# Patient Record
Sex: Female | Born: 1955 | Race: Black or African American | Hispanic: No | Marital: Married | Smoking: Never smoker
Health system: Southern US, Community
[De-identification: ages and names within clinical notes are randomized; demographics above are authoritative.]

## PROBLEM LIST (undated history)

## (undated) DIAGNOSIS — I639 Cerebral infarction, unspecified: Secondary | ICD-10-CM

## (undated) DIAGNOSIS — K219 Gastro-esophageal reflux disease without esophagitis: Secondary | ICD-10-CM

## (undated) DIAGNOSIS — Z87898 Personal history of other specified conditions: Secondary | ICD-10-CM

## (undated) DIAGNOSIS — M51369 Other intervertebral disc degeneration, lumbar region without mention of lumbar back pain or lower extremity pain: Secondary | ICD-10-CM

## (undated) DIAGNOSIS — I272 Pulmonary hypertension, unspecified: Secondary | ICD-10-CM

## (undated) DIAGNOSIS — W19XXXA Unspecified fall, initial encounter: Secondary | ICD-10-CM

## (undated) DIAGNOSIS — J45909 Unspecified asthma, uncomplicated: Secondary | ICD-10-CM

## (undated) DIAGNOSIS — M5136 Other intervertebral disc degeneration, lumbar region: Secondary | ICD-10-CM

## (undated) DIAGNOSIS — F1190 Opioid use, unspecified, uncomplicated: Secondary | ICD-10-CM

## (undated) DIAGNOSIS — G8929 Other chronic pain: Secondary | ICD-10-CM

## (undated) DIAGNOSIS — J449 Chronic obstructive pulmonary disease, unspecified: Secondary | ICD-10-CM

## (undated) DIAGNOSIS — M549 Dorsalgia, unspecified: Secondary | ICD-10-CM

## (undated) DIAGNOSIS — I10 Essential (primary) hypertension: Secondary | ICD-10-CM

## (undated) DIAGNOSIS — IMO0002 Reserved for concepts with insufficient information to code with codable children: Secondary | ICD-10-CM

## (undated) DIAGNOSIS — F1210 Cannabis abuse, uncomplicated: Secondary | ICD-10-CM

## (undated) DIAGNOSIS — F419 Anxiety disorder, unspecified: Secondary | ICD-10-CM

## (undated) DIAGNOSIS — IMO0001 Reserved for inherently not codable concepts without codable children: Secondary | ICD-10-CM

## (undated) HISTORY — PX: CHOLECYSTECTOMY: SHX55

## (undated) HISTORY — PX: OTHER SURGICAL HISTORY: SHX169

## (undated) HISTORY — PX: HERNIA REPAIR: SHX51

---

## 2008-05-15 ENCOUNTER — Emergency Department (HOSPITAL_COMMUNITY): Admission: EM | Admit: 2008-05-15 | Discharge: 2008-05-16 | Payer: Self-pay | Admitting: Emergency Medicine

## 2008-08-09 ENCOUNTER — Emergency Department (HOSPITAL_COMMUNITY): Admission: EM | Admit: 2008-08-09 | Discharge: 2008-08-09 | Payer: Self-pay | Admitting: Internal Medicine

## 2008-09-30 ENCOUNTER — Emergency Department: Payer: Self-pay | Admitting: Emergency Medicine

## 2008-12-20 ENCOUNTER — Emergency Department (HOSPITAL_BASED_OUTPATIENT_CLINIC_OR_DEPARTMENT_OTHER): Admission: EM | Admit: 2008-12-20 | Discharge: 2008-12-20 | Payer: Self-pay | Admitting: Emergency Medicine

## 2008-12-23 ENCOUNTER — Emergency Department (HOSPITAL_BASED_OUTPATIENT_CLINIC_OR_DEPARTMENT_OTHER): Admission: EM | Admit: 2008-12-23 | Discharge: 2008-12-23 | Payer: Self-pay | Admitting: Emergency Medicine

## 2009-02-15 ENCOUNTER — Emergency Department (HOSPITAL_BASED_OUTPATIENT_CLINIC_OR_DEPARTMENT_OTHER): Admission: EM | Admit: 2009-02-15 | Discharge: 2009-02-15 | Payer: Self-pay | Admitting: Emergency Medicine

## 2009-02-16 ENCOUNTER — Emergency Department: Payer: Self-pay | Admitting: Emergency Medicine

## 2009-03-01 ENCOUNTER — Emergency Department (HOSPITAL_COMMUNITY): Admission: EM | Admit: 2009-03-01 | Discharge: 2009-03-01 | Payer: Self-pay | Admitting: Emergency Medicine

## 2009-05-24 ENCOUNTER — Ambulatory Visit: Payer: Self-pay | Admitting: Gastroenterology

## 2009-06-06 ENCOUNTER — Telehealth: Payer: Self-pay | Admitting: Gastroenterology

## 2009-06-10 ENCOUNTER — Emergency Department (HOSPITAL_BASED_OUTPATIENT_CLINIC_OR_DEPARTMENT_OTHER): Admission: EM | Admit: 2009-06-10 | Discharge: 2009-06-10 | Payer: Self-pay | Admitting: Emergency Medicine

## 2009-06-10 ENCOUNTER — Ambulatory Visit: Payer: Self-pay | Admitting: Diagnostic Radiology

## 2009-06-15 ENCOUNTER — Ambulatory Visit: Payer: Self-pay | Admitting: Gastroenterology

## 2009-07-09 ENCOUNTER — Emergency Department (HOSPITAL_BASED_OUTPATIENT_CLINIC_OR_DEPARTMENT_OTHER): Admission: EM | Admit: 2009-07-09 | Discharge: 2009-07-09 | Payer: Self-pay | Admitting: Emergency Medicine

## 2009-08-27 ENCOUNTER — Emergency Department (HOSPITAL_BASED_OUTPATIENT_CLINIC_OR_DEPARTMENT_OTHER): Admission: EM | Admit: 2009-08-27 | Discharge: 2009-08-27 | Payer: Self-pay | Admitting: Emergency Medicine

## 2009-08-27 ENCOUNTER — Ambulatory Visit: Payer: Self-pay | Admitting: Diagnostic Radiology

## 2009-09-13 ENCOUNTER — Emergency Department: Payer: Self-pay | Admitting: Emergency Medicine

## 2009-10-07 ENCOUNTER — Ambulatory Visit: Payer: Self-pay | Admitting: Diagnostic Radiology

## 2009-10-07 ENCOUNTER — Emergency Department (HOSPITAL_BASED_OUTPATIENT_CLINIC_OR_DEPARTMENT_OTHER): Admission: EM | Admit: 2009-10-07 | Discharge: 2009-10-07 | Payer: Self-pay | Admitting: Emergency Medicine

## 2009-12-29 ENCOUNTER — Emergency Department (HOSPITAL_BASED_OUTPATIENT_CLINIC_OR_DEPARTMENT_OTHER): Admission: EM | Admit: 2009-12-29 | Discharge: 2009-12-29 | Payer: Self-pay | Admitting: Emergency Medicine

## 2010-03-14 ENCOUNTER — Emergency Department (HOSPITAL_BASED_OUTPATIENT_CLINIC_OR_DEPARTMENT_OTHER): Admission: EM | Admit: 2010-03-14 | Discharge: 2010-03-14 | Payer: Self-pay | Admitting: Emergency Medicine

## 2010-12-25 NOTE — Miscellaneous (Signed)
Summary: [facility]  Clinical Lists Changes  Medications: Added new medication of MIRALAX   POWD (POLYETHYLENE GLYCOL 3350) As per prep  instructions. - Signed Added new medication of REGLAN 10 MG  TABS (METOCLOPRAMIDE HCL) As per prep instructions. - Signed Added new medication of DULCOLAX 5 MG  TBEC (BISACODYL) Day before procedure take 2 at 3pm and 2 at 8pm. - Signed Rx of MIRALAX   POWD (POLYETHYLENE GLYCOL 3350) As per prep  instructions.;  #255gm x 0;  Signed;  Entered by: Ezra Sites RN;  Authorized by: Louis Meckel MD;  Method used: Electronically to [facility] [phone]*, [address] [place], [place], [place]  [address], Ph: [phone], Fax: [phone] Rx of REGLAN 10 MG  TABS (METOCLOPRAMIDE HCL) As per prep instructions.;  #2 x 0;  Signed;  Entered by: Ezra Sites RN;  Authorized by: Louis Meckel MD;  Method used: Electronically to [facility] [phone]*, [address], [place], [place], [place]  [address], Ph: [phone], Fax: [phone] Rx of DULCOLAX 5 MG  TBEC (BISACODYL) Day before procedure take 2 at 3pm and 2 at 8pm.;  #4 x 0;  Signed;  Entered by: Ezra Sites RN;  Authorized by: Louis Meckel MD;  Method used: Electronically to [facility] [phone]*, [address] [place], [place], [place]  [address], Ph: [phone], Fax: [phone] Allergies: Added new allergy or adverse reaction of IODINE Added new allergy or adverse reaction of * SHELLFISH Added new allergy or adverse reaction of ERYTHROMYCIN Observations: Added new observation of NKA: F (05/24/2009 12:48)    Prescriptions: DULCOLAX 5 MG  TBEC (BISACODYL) Day before procedure take 2 at 3pm and 2 at 8pm.  #4 x 0   Entered by:   Ezra Sites RN   Authorized by:   Louis Meckel MD   Signed by:   Ezra Sites RN on 05/24/2009   Method used:   Electronically to        [facility] [phone]* (retail)       [address]       [place], [place]  [address]       Ph: [phone]       Fax: [phone]   RxID:   [id number] REGLAN 10 MG  TABS (METOCLOPRAMIDE HCL) As per prep instructions.  #2 x 0   Entered by:   Ezra Sites RN   Authorized by:   Louis Meckel MD   Signed by:   Ezra Sites RN on 05/24/2009   Method used:   Electronically to        [facility] [phone]* (retail)       [address]       [place], [place]  [address]       Ph: [phone]       Fax: [phone]   RxID:   [id number] MIRALAX   POWD (POLYETHYLENE GLYCOL 3350) As per prep  instructions.  #255gm x 0   Entered by:   Ezra Sites RN   Authorized by:   Louis Meckel MD   Signed by:   Ezra Sites RN on 05/24/2009   Method used:   Electronically to        [facility] [phone]* (retail)       [phone]  [place], [place]  [address]       Ph: [phone]       Fax: [phone]   RxID:   [id number]

## 2011-02-13 LAB — URINALYSIS, ROUTINE W REFLEX MICROSCOPIC
Ketones, ur: NEGATIVE mg/dL
Nitrite: NEGATIVE
Specific Gravity, Urine: 1.015 (ref 1.005–1.030)
pH: 7 (ref 5.0–8.0)

## 2011-02-13 LAB — URINE CULTURE: Colony Count: 45000

## 2011-02-27 LAB — GLUCOSE, CAPILLARY

## 2011-03-03 LAB — BASIC METABOLIC PANEL
CO2: 29 mEq/L (ref 19–32)
Calcium: 9.1 mg/dL (ref 8.4–10.5)
Chloride: 101 mEq/L (ref 96–112)
Creatinine, Ser: 1.2 mg/dL (ref 0.4–1.2)
GFR calc Af Amer: 57 mL/min — ABNORMAL LOW (ref >60)
GFR calc non Af Amer: 47 mL/min — ABNORMAL LOW (ref >60)
Potassium: 3.6 mEq/L (ref 3.5–5.1)
Sodium: 143 mEq/L (ref 135–145)

## 2011-03-03 LAB — POCT B-TYPE NATRIURETIC PEPTIDE (BNP): B Natriuretic Peptide, POC: 8 pg/mL (ref 0–100)

## 2011-03-07 LAB — PREGNANCY, URINE: Preg Test, Ur: NEGATIVE

## 2011-03-07 LAB — URINALYSIS, ROUTINE W REFLEX MICROSCOPIC
Bilirubin Urine: NEGATIVE
Ketones, ur: NEGATIVE mg/dL
Protein, ur: NEGATIVE mg/dL
Specific Gravity, Urine: 1.017 (ref 1.005–1.030)
Urobilinogen, UA: 1 mg/dL (ref 0.0–1.0)
pH: 7 (ref 5.0–8.0)

## 2011-03-07 LAB — URINE MICROSCOPIC-ADD ON

## 2012-07-24 ENCOUNTER — Emergency Department (HOSPITAL_BASED_OUTPATIENT_CLINIC_OR_DEPARTMENT_OTHER)
Admission: EM | Admit: 2012-07-24 | Discharge: 2012-07-24 | Disposition: A | Discharge status: Home / Self Care | Payer: Medicaid Other | Attending: Emergency Medicine | Admitting: Emergency Medicine

## 2012-07-24 ENCOUNTER — Emergency Department (HOSPITAL_BASED_OUTPATIENT_CLINIC_OR_DEPARTMENT_OTHER): Payer: Medicaid Other

## 2012-07-24 ENCOUNTER — Encounter (HOSPITAL_BASED_OUTPATIENT_CLINIC_OR_DEPARTMENT_OTHER): Payer: Self-pay

## 2012-07-24 DIAGNOSIS — R05 Cough: Secondary | ICD-10-CM | POA: Insufficient documentation

## 2012-07-24 DIAGNOSIS — IMO0002 Reserved for concepts with insufficient information to code with codable children: Secondary | ICD-10-CM | POA: Insufficient documentation

## 2012-07-24 DIAGNOSIS — J45909 Unspecified asthma, uncomplicated: Secondary | ICD-10-CM | POA: Insufficient documentation

## 2012-07-24 DIAGNOSIS — I10 Essential (primary) hypertension: Secondary | ICD-10-CM | POA: Insufficient documentation

## 2012-07-24 DIAGNOSIS — J069 Acute upper respiratory infection, unspecified: Secondary | ICD-10-CM

## 2012-07-24 DIAGNOSIS — R059 Cough, unspecified: Secondary | ICD-10-CM | POA: Insufficient documentation

## 2012-07-24 HISTORY — DX: Unspecified asthma, uncomplicated: J45.909

## 2012-07-24 HISTORY — DX: Essential (primary) hypertension: I10

## 2012-07-24 HISTORY — DX: Reserved for concepts with insufficient information to code with codable children: IMO0002

## 2012-07-24 MED ORDER — AMOXICILLIN 500 MG PO CAPS: 500.0000 mg | ORAL_CAPSULE | Freq: Three times a day (TID) | ORAL | Status: AC

## 2012-07-24 MED ORDER — ALBUTEROL SULFATE (5 MG/ML) 0.5% IN NEBU
5.0000 mg | INHALATION_SOLUTION | Freq: Once | RESPIRATORY_TRACT | Status: AC
  Administered 2012-07-24: 5 mg via RESPIRATORY_TRACT
  Filled 2012-07-24: qty 1

## 2012-07-24 MED ORDER — IPRATROPIUM BROMIDE 0.02 % IN SOLN
0.5000 mg | Freq: Once | RESPIRATORY_TRACT | Status: AC
  Administered 2012-07-24: 0.5 mg via RESPIRATORY_TRACT
  Filled 2012-07-24: qty 2.5

## 2012-07-24 NOTE — ED Notes (Signed)
Pt reports chest congestion and nonproductive cough x 1 month unrelieved after taking OTC Mucinex.

## 2012-07-24 NOTE — ED Notes (Signed)
Patient transported to X-ray 

## 2012-07-24 NOTE — Discharge Instructions (Signed)

## 2012-07-24 NOTE — ED Notes (Signed)
MD at bedside. 

## 2012-07-24 NOTE — ED Provider Notes (Addendum)
History     CSN: [id number]  Arrival date & time 07/24/12  1014   First MD Initiated Contact with Patient 07/24/12 1029      Chief Complaint  Patient presents with  . Cough  . URI    (Consider location/radiation/quality/duration/timing/severity/associated sxs/prior treatment) HPI Comments: Patient with history of asthma, smoker.  Presents with one month history of chest congestion, cough that is non-productive, shortness of breath.  She denies fevers or chills.  No chest pain or leg swelling.    Patient is a 56 y.o. female presenting with cough. The history is provided by the patient.  Cough This is a new problem. Episode onset: one month ago. The problem occurs constantly. The problem has been gradually worsening. The cough is non-productive. There has been no fever. Associated symptoms include shortness of breath. Pertinent negatives include no chest pain. Treatments tried: inhaler. The treatment provided no relief. She is a smoker.    Past Medical History  Diagnosis Date  . Asthma   . Hypertension   . Degenerative disc disease     Past Surgical History  Procedure Date  . Cesarean section     No family history on file.  History  Substance Use Topics  . Smoking status: Not on file  . Smokeless tobacco: Never Used  . Alcohol Use: No    OB History    Grav Para Term Preterm Abortions TAB SAB Ect Mult Living                  Review of Systems  Respiratory: Positive for shortness of breath.   Cardiovascular: Negative for chest pain.  All other systems reviewed and are negative.    Allergies  Erythromycin and Iodine  Home Medications   Current Outpatient Rx  Name Route Sig Dispense Refill  . ALBUTEROL SULFATE HFA IN Inhalation Inhale into the lungs.    . ALPRAZOLAM 2 MG PO TABS Oral Take 2 mg by mouth 3 (three) times daily.    Marland Kitchen AMLODIPINE BESYLATE 10 MG PO TABS Oral Take 10 mg by mouth daily.    Marland Kitchen GABAPENTIN 300 MG PO CAPS Oral Take 300 mg by mouth at  bedtime.    . OXYCODONE HCL ER 40 MG PO TB12 Oral Take 40 mg by mouth 3 (three) times daily.    . OXYCODONE-ACETAMINOPHEN 10-325 MG PO TABS Oral Take 1 tablet by mouth every 4 (four) hours as needed.      BP 138/73  Pulse 108  Temp 98.4 F (36.9 C) (Oral)  Resp 18  Ht 5\' 10"  (1.778 m)  Wt 288 lb (130.636 kg)  BMI 41.32 kg/m2  SpO2 98%  Physical Exam  Nursing note and vitals reviewed. Constitutional: She is oriented to person, place, and time. She appears well-developed and well-nourished. No distress.  HENT:  Head: Normocephalic and atraumatic.  Neck: Normal range of motion. Neck supple.  Cardiovascular: Normal rate and regular rhythm.  Exam reveals no gallop and no friction rub.   No murmur heard. Pulmonary/Chest: Effort normal and breath sounds normal.        Slight rhonchi bilaterally but no rales.  Abdominal: Soft. Bowel sounds are normal. She exhibits no distension. There is no tenderness.  Musculoskeletal: Normal range of motion.  Neurological: She is alert and oriented to person, place, and time.  Skin: Skin is warm and dry. She is not diaphoretic.    ED Course  Procedures (including critical care time)  Labs Reviewed - No data to  display No results found.   No diagnosis found.    MDM  The patient was given a neb and the chest xray looks okay.  She will be discharged with amoxicillin and prn follow up.  Continue mdi.        Geoffery Lyons, MD 07/24/12 1153  Geoffery Lyons, MD 07/24/12 1155

## 2012-08-06 ENCOUNTER — Emergency Department (HOSPITAL_BASED_OUTPATIENT_CLINIC_OR_DEPARTMENT_OTHER): Payer: Medicaid Other

## 2012-08-06 ENCOUNTER — Encounter (HOSPITAL_BASED_OUTPATIENT_CLINIC_OR_DEPARTMENT_OTHER): Payer: Self-pay | Admitting: *Deleted

## 2012-08-06 ENCOUNTER — Emergency Department (HOSPITAL_BASED_OUTPATIENT_CLINIC_OR_DEPARTMENT_OTHER)
Admission: EM | Admit: 2012-08-06 | Discharge: 2012-08-06 | Disposition: A | Discharge status: Home / Self Care | Payer: Medicaid Other | Attending: Emergency Medicine | Admitting: Emergency Medicine

## 2012-08-06 DIAGNOSIS — R51 Headache: Secondary | ICD-10-CM | POA: Insufficient documentation

## 2012-08-06 DIAGNOSIS — M25519 Pain in unspecified shoulder: Secondary | ICD-10-CM | POA: Insufficient documentation

## 2012-08-06 DIAGNOSIS — M545 Low back pain, unspecified: Secondary | ICD-10-CM | POA: Insufficient documentation

## 2012-08-06 DIAGNOSIS — I10 Essential (primary) hypertension: Secondary | ICD-10-CM | POA: Insufficient documentation

## 2012-08-06 DIAGNOSIS — S0990XA Unspecified injury of head, initial encounter: Secondary | ICD-10-CM | POA: Insufficient documentation

## 2012-08-06 DIAGNOSIS — E119 Type 2 diabetes mellitus without complications: Secondary | ICD-10-CM | POA: Insufficient documentation

## 2012-08-06 DIAGNOSIS — N644 Mastodynia: Secondary | ICD-10-CM | POA: Insufficient documentation

## 2012-08-06 DIAGNOSIS — IMO0002 Reserved for concepts with insufficient information to code with codable children: Secondary | ICD-10-CM | POA: Insufficient documentation

## 2012-08-06 HISTORY — DX: Other intervertebral disc degeneration, lumbar region: M51.36

## 2012-08-06 HISTORY — DX: Other intervertebral disc degeneration, lumbar region without mention of lumbar back pain or lower extremity pain: M51.369

## 2012-08-06 HISTORY — DX: Dorsalgia, unspecified: M54.9

## 2012-08-06 HISTORY — DX: Other chronic pain: G89.29

## 2012-08-06 HISTORY — DX: Reserved for inherently not codable concepts without codable children: IMO0001

## 2012-08-06 HISTORY — DX: Gastro-esophageal reflux disease without esophagitis: K21.9

## 2012-08-06 LAB — URINALYSIS, ROUTINE W REFLEX MICROSCOPIC
Ketones, ur: NEGATIVE mg/dL
Nitrite: NEGATIVE
Urobilinogen, UA: 1 mg/dL (ref 0.0–1.0)
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
## 2012-08-06 NOTE — ED Notes (Signed)
Patient ambulatory to the bathroom with standby assistance; gait steady.

## 2012-08-17 ENCOUNTER — Emergency Department (HOSPITAL_BASED_OUTPATIENT_CLINIC_OR_DEPARTMENT_OTHER)
Admission: EM | Admit: 2012-08-17 | Discharge: 2012-08-17 | Disposition: A | Discharge status: Home / Self Care | Payer: Medicaid Other | Attending: Emergency Medicine | Admitting: Emergency Medicine

## 2012-08-17 ENCOUNTER — Emergency Department (HOSPITAL_BASED_OUTPATIENT_CLINIC_OR_DEPARTMENT_OTHER): Payer: Medicaid Other

## 2012-08-17 ENCOUNTER — Encounter (HOSPITAL_BASED_OUTPATIENT_CLINIC_OR_DEPARTMENT_OTHER): Payer: Self-pay | Admitting: Student

## 2012-08-17 DIAGNOSIS — K219 Gastro-esophageal reflux disease without esophagitis: Secondary | ICD-10-CM | POA: Insufficient documentation

## 2012-08-17 DIAGNOSIS — J189 Pneumonia, unspecified organism: Secondary | ICD-10-CM | POA: Insufficient documentation

## 2012-08-17 DIAGNOSIS — G8929 Other chronic pain: Secondary | ICD-10-CM | POA: Insufficient documentation

## 2012-08-17 DIAGNOSIS — J45909 Unspecified asthma, uncomplicated: Secondary | ICD-10-CM | POA: Insufficient documentation

## 2012-08-17 DIAGNOSIS — I10 Essential (primary) hypertension: Secondary | ICD-10-CM | POA: Insufficient documentation

## 2012-08-17 DIAGNOSIS — Z91041 Radiographic dye allergy status: Secondary | ICD-10-CM | POA: Insufficient documentation

## 2012-08-17 DIAGNOSIS — E119 Type 2 diabetes mellitus without complications: Secondary | ICD-10-CM | POA: Insufficient documentation

## 2012-08-17 DIAGNOSIS — F17200 Nicotine dependence, unspecified, uncomplicated: Secondary | ICD-10-CM | POA: Insufficient documentation

## 2012-08-17 DIAGNOSIS — Z881 Allergy status to other antibiotic agents status: Secondary | ICD-10-CM | POA: Insufficient documentation

## 2012-08-17 DIAGNOSIS — M549 Dorsalgia, unspecified: Secondary | ICD-10-CM | POA: Insufficient documentation

## 2012-08-17 LAB — CBC WITH DIFFERENTIAL/PLATELET
Basophils Relative: 0 % (ref 0–1)
Eosinophils Absolute: 0.1 10*3/uL (ref 0.0–0.7)
Hemoglobin: 10.4 g/dL — ABNORMAL LOW (ref 12.0–15.0)
MCH: 24.5 pg — ABNORMAL LOW (ref 26.0–34.0)
MCHC: 31.1 g/dL (ref 30.0–36.0)
Monocytes Relative: 6 % (ref 3–12)
Neutrophils Relative %: 80 % — ABNORMAL HIGH (ref 43–77)
Platelets: 365 10*3/uL (ref 150–400)

## 2012-08-17 LAB — URINALYSIS, ROUTINE W REFLEX MICROSCOPIC
Bilirubin Urine: NEGATIVE
Ketones, ur: NEGATIVE mg/dL
Nitrite: NEGATIVE
Protein, ur: NEGATIVE mg/dL
Specific Gravity, Urine: 1.016 (ref 1.005–1.030)
Urobilinogen, UA: 1 mg/dL (ref 0.0–1.0)

## 2012-08-17 LAB — BASIC METABOLIC PANEL
BUN: 11 mg/dL (ref 6–23)
GFR calc Af Amer: 82 mL/min — ABNORMAL LOW (ref >90)
GFR calc non Af Amer: 71 mL/min — ABNORMAL LOW (ref >90)
Potassium: 4.2 mEq/L (ref 3.5–5.1)

## 2012-08-17 MED ORDER — CEFTRIAXONE SODIUM 1 G IJ SOLR
1.0000 g | Freq: Once | INTRAMUSCULAR | Status: AC
  Administered 2012-08-17: 1 g via INTRAMUSCULAR
  Filled 2012-08-17: qty 10

## 2012-08-17 MED ORDER — LIDOCAINE HCL (PF) 1 % IJ SOLN
INTRAMUSCULAR | Status: AC
  Administered 2012-08-17: 12:00:00
  Filled 2012-08-17: qty 5

## 2012-08-17 MED ORDER — DOXYCYCLINE HYCLATE 100 MG PO CAPS: 100.0000 mg | ORAL_CAPSULE | Freq: Two times a day (BID) | ORAL | Status: DC

## 2012-08-17 MED ORDER — DEXTROSE 5 % IV SOLN: 1.0000 g | Freq: Once | INTRAVENOUS | Status: DC

## 2012-08-17 NOTE — ED Notes (Addendum)
Pt rep[orts taking tylenol 2 hrs ago for elevated fever. Reports she is currently in pain management and doesn't want any narcotics.

## 2012-08-17 NOTE — ED Notes (Signed)
Pt in with c/o generalized aches, chills, cough,congestion starting last night

## 2012-08-17 NOTE — ED Provider Notes (Signed)
History     CSN: [id number]  Arrival date & time 08/17/12  0845   First MD Initiated Contact with Patient 08/17/12 [phone]      Chief Complaint  Patient presents with  . URI     HPI Pt in with c/o generalized aches, chills, cough,congestion starting last night.  Patient had temperature up to 104 last night.  Denies vomiting.  Past Medical History  Diagnosis Date  . Asthma   . Hypertension   . Degenerative disc disease   . DDD (degenerative disc disease), lumbar   . DDD (degenerative disc disease)   . Chronic back pain   . DDD (degenerative disc disease)   . Reflux   . Diabetes mellitus     Past Surgical History  Procedure Date  . Cesarean section     History reviewed. No pertinent family history.  History  Substance Use Topics  . Smoking status: Current Every Day Smoker -- 0.5 packs/day  . Smokeless tobacco: Never Used  . Alcohol Use: No    OB History    Grav Para Term Preterm Abortions TAB SAB Ect Mult Living                  Review of Systems  All other systems reviewed and are negative.    Allergies  Erythromycin and Iodine  Home Medications   Current Outpatient Rx  Name Route Sig Dispense Refill  . ALBUTEROL SULFATE HFA IN Inhalation Inhale into the lungs.    . ALPRAZOLAM 2 MG PO TABS Oral Take 2 mg by mouth 3 (three) times daily.    Marland Kitchen AMLODIPINE BESYLATE 10 MG PO TABS Oral Take 10 mg by mouth daily.    Marland Kitchen DOXYCYCLINE HYCLATE 100 MG PO CAPS Oral Take 1 capsule (100 mg total) by mouth 2 (two) times daily. 20 capsule 0  . GABAPENTIN 300 MG PO CAPS Oral Take 300 mg by mouth at bedtime.    . OXYCODONE HCL ER 40 MG PO TB12 Oral Take 40 mg by mouth 3 (three) times daily.    . OXYCODONE-ACETAMINOPHEN 10-325 MG PO TABS Oral Take 1 tablet by mouth every 4 (four) hours as needed.      BP 142/79  Pulse 128  Temp 101.8 F (38.8 C) (Oral)  Resp 18  Wt 260 lb (117.935 kg)  SpO2 95%  Physical Exam  Nursing note and vitals reviewed. Constitutional: She  is oriented to person, place, and time. She appears well-developed. She appears ill. No distress.  HENT:  Head: Normocephalic and atraumatic.  Eyes: Pupils are equal, round, and reactive to light.  Neck: Normal range of motion.  Cardiovascular: Normal rate and intact distal pulses.   Pulmonary/Chest: No respiratory distress.  Abdominal: Normal appearance. She exhibits no distension.  Musculoskeletal: Normal range of motion.  Neurological: She is alert and oriented to person, place, and time. No cranial nerve deficit.  Skin: Skin is warm and dry. No rash noted.  Psychiatric: She has a normal mood and affect. Her behavior is normal.    ED Course  Procedures (including critical care time)   CURB-65 Score=0  Scheduled Meds:   . cefTRIAXone (ROCEPHIN) IM  1 g Intramuscular Once   Continuous Infusions:   . DISCONTD: cefTRIAXone (ROCEPHIN)  IV     PRN Meds:.  Labs Reviewed  CBC WITH DIFFERENTIAL - Abnormal; Notable for the following:    WBC 15.7 (*)     Hemoglobin 10.4 (*)     HCT 33.4 (*)  MCH 24.5 (*)     RDW 16.7 (*)     Neutrophils Relative 80 (*)     Neutro Abs 12.5 (*)     All other components within normal limits  BASIC METABOLIC PANEL - Abnormal; Notable for the following:    Glucose, Bld 121 (*)     GFR calc non Af Amer 71 (*)     GFR calc Af Amer 82 (*)     All other components within normal limits  URINALYSIS, ROUTINE W REFLEX MICROSCOPIC   Dg Chest 2 View  08/17/2012  *RADIOLOGY REPORT*  Clinical Data: Fever  CHEST - 2 VIEW  Comparison: 08/06/2012  Findings: The heart and pulmonary vascularity are within normal limits.  The lungs are well-aerated bilaterally but some mild right basilar infiltrate is seen in the right middle lobe.  Other focal infiltrate is seen.  IMPRESSION: Right middle lobe infiltrate.  Follow-up films to resolution are recommended.   Original Report Authenticated By: Phillips Odor, M.D.      1. Community acquired pneumonia        MDM         Nelia Shi, MD 08/17/12 1102

## 2012-11-14 ENCOUNTER — Emergency Department (HOSPITAL_BASED_OUTPATIENT_CLINIC_OR_DEPARTMENT_OTHER): Payer: Medicaid Other

## 2012-11-14 ENCOUNTER — Encounter (HOSPITAL_BASED_OUTPATIENT_CLINIC_OR_DEPARTMENT_OTHER): Payer: Self-pay

## 2012-11-14 ENCOUNTER — Emergency Department (HOSPITAL_BASED_OUTPATIENT_CLINIC_OR_DEPARTMENT_OTHER)
Admission: EM | Admit: 2012-11-14 | Discharge: 2012-11-15 | Disposition: A | Discharge status: Home / Self Care | Payer: Medicaid Other | Attending: Emergency Medicine | Admitting: Emergency Medicine

## 2012-11-14 DIAGNOSIS — E878 Other disorders of electrolyte and fluid balance, not elsewhere classified: Secondary | ICD-10-CM | POA: Insufficient documentation

## 2012-11-14 DIAGNOSIS — M549 Dorsalgia, unspecified: Secondary | ICD-10-CM | POA: Insufficient documentation

## 2012-11-14 DIAGNOSIS — J45909 Unspecified asthma, uncomplicated: Secondary | ICD-10-CM | POA: Insufficient documentation

## 2012-11-14 DIAGNOSIS — R531 Weakness: Secondary | ICD-10-CM

## 2012-11-14 DIAGNOSIS — R259 Unspecified abnormal involuntary movements: Secondary | ICD-10-CM | POA: Insufficient documentation

## 2012-11-14 DIAGNOSIS — G20 Parkinson's disease: Secondary | ICD-10-CM | POA: Insufficient documentation

## 2012-11-14 DIAGNOSIS — M1990 Unspecified osteoarthritis, unspecified site: Secondary | ICD-10-CM | POA: Insufficient documentation

## 2012-11-14 DIAGNOSIS — F17200 Nicotine dependence, unspecified, uncomplicated: Secondary | ICD-10-CM | POA: Insufficient documentation

## 2012-11-14 DIAGNOSIS — M5137 Other intervertebral disc degeneration, lumbosacral region: Secondary | ICD-10-CM | POA: Insufficient documentation

## 2012-11-14 DIAGNOSIS — E119 Type 2 diabetes mellitus without complications: Secondary | ICD-10-CM | POA: Insufficient documentation

## 2012-11-14 DIAGNOSIS — I10 Essential (primary) hypertension: Secondary | ICD-10-CM | POA: Insufficient documentation

## 2012-11-14 DIAGNOSIS — R42 Dizziness and giddiness: Secondary | ICD-10-CM | POA: Diagnosis present

## 2012-11-14 DIAGNOSIS — Z79899 Other long term (current) drug therapy: Secondary | ICD-10-CM | POA: Insufficient documentation

## 2012-11-14 DIAGNOSIS — R5381 Other malaise: Secondary | ICD-10-CM | POA: Insufficient documentation

## 2012-11-14 DIAGNOSIS — M51379 Other intervertebral disc degeneration, lumbosacral region without mention of lumbar back pain or lower extremity pain: Secondary | ICD-10-CM | POA: Insufficient documentation

## 2012-11-14 DIAGNOSIS — G8929 Other chronic pain: Secondary | ICD-10-CM | POA: Insufficient documentation

## 2012-11-14 DIAGNOSIS — G20A1 Parkinson's disease without dyskinesia, without mention of fluctuations: Secondary | ICD-10-CM | POA: Insufficient documentation

## 2012-11-14 LAB — DIFFERENTIAL
Basophils Absolute: 0 10*3/uL (ref 0.0–0.1)
Basophils Relative: 0 % (ref 0–1)
Eosinophils Absolute: 0.6 K/uL (ref 0.0–0.7)
Eosinophils Relative: 7 % — ABNORMAL HIGH (ref 0–5)
Lymphocytes Relative: 49 % — ABNORMAL HIGH (ref 12–46)
Lymphs Abs: 3.8 K/uL (ref 0.7–4.0)
Monocytes Absolute: 0.9 K/uL (ref 0.1–1.0)
Monocytes Relative: 12 % (ref 3–12)
Neutro Abs: 2.4 10*3/uL (ref 1.7–7.7)
Neutrophils Relative %: 32 % — ABNORMAL LOW (ref 43–77)

## 2012-11-14 LAB — PROTIME-INR
INR: 0.89 (ref 0.00–1.49)
Prothrombin Time: 12 s (ref 11.6–15.2)

## 2012-11-14 LAB — COMPREHENSIVE METABOLIC PANEL
AST: 15 U/L (ref 0–37)
Albumin: 3.9 g/dL (ref 3.5–5.2)
Alkaline Phosphatase: 137 U/L — ABNORMAL HIGH (ref 39–117)
BUN: 9 mg/dL (ref 6–23)
CO2: 27 mEq/L (ref 19–32)
Chloride: 102 mEq/L (ref 96–112)
Potassium: 3.9 mEq/L (ref 3.5–5.1)
Total Bilirubin: 0.2 mg/dL — ABNORMAL LOW (ref 0.3–1.2)

## 2012-11-14 LAB — CBC
HCT: 38.7 % (ref 36.0–46.0)
Hemoglobin: 12 g/dL (ref 12.0–15.0)
MCH: 24.6 pg — ABNORMAL LOW (ref 26.0–34.0)
MCHC: 31 g/dL (ref 30.0–36.0)
MCV: 79.5 fL (ref 78.0–100.0)
Platelets: 427 K/uL — ABNORMAL HIGH (ref 150–400)
RBC: 4.87 MIL/uL (ref 3.87–5.11)
RDW: 16.3 % — ABNORMAL HIGH (ref 11.5–15.5)
WBC: 7.7 10*3/uL (ref 4.0–10.5)

## 2012-11-14 LAB — COMPREHENSIVE METABOLIC PANEL WITH GFR
ALT: 12 U/L (ref 0–35)
Calcium: 9.4 mg/dL (ref 8.4–10.5)
Creatinine, Ser: 0.9 mg/dL (ref 0.50–1.10)
GFR calc Af Amer: 82 mL/min — ABNORMAL LOW (ref >90)
GFR calc non Af Amer: 71 mL/min — ABNORMAL LOW (ref >90)
Glucose, Bld: 112 mg/dL — ABNORMAL HIGH (ref 70–99)
Sodium: 137 meq/L (ref 135–145)
Total Protein: 8.2 g/dL (ref 6.0–8.3)

## 2012-11-14 LAB — APTT: aPTT: 41 seconds — ABNORMAL HIGH (ref 24–37)

## 2012-11-14 NOTE — ED Notes (Signed)
Report to [facility] RN at [facility] ED. Patient remains alert and oriented, EMS at bedside to transport

## 2012-11-14 NOTE — ED Provider Notes (Signed)
History   This chart was scribed for Teresa Wiley. Andra Heslin, MD scribed by Magnus Sinning. The patient was seen in room MH05/MH05 at 23:16    CSN: [id number]  Arrival date & time 11/14/12  2304   None     Chief Complaint  Patient presents with  . Code Stroke    (Consider location/radiation/quality/duration/timing/severity/associated sxs/prior treatment) The history is provided by a relative. No language interpreter was used.   Teresa Wiley is a 56 y.o. female who presents to the Emergency Department complaining of sudden onset severe weakness with associated trembling.  The sister says that it has been a stressful day and the patient was upset today because they were supposed to travel and were unable. The sister says they started watching a movie approximately 3 hours ago and that the pt did not appear like normal self. She says as the movie went on, the pt continued to appear unwell, but the sister notes she did appear to show improvement and that the pt discussed that she wanted to eat. They apparently continued to watch the movie and when the movie concluded the pt expressed she wanted to eat. They reportedly went into the kitchen and began to tidy. At some point the pt stated that she wanted to use the restroom and states when she went to the restroom and stood up from the toilet that she began experiencing dizziness and feelings as if she wer "drunk." She says that this worsened and that shortly after her body felt heavy and she also experienced body trembling. The patient's relative states the patient started having episode about 1.5-2 hours ago.    Per sister, the pt takes 40 mg of oxycontin TID for treatment of DDD, and arthritis The patient denies syncope or HA, and she states she remembers her daughter asking her to watch a movie and laying down to watch the movie. She says she remembers wanting to eat, but noting that the kitchen needed to be cleaned. She says shortly after she began  feeling dizzy like she was unable to concentrate and that her whole body started feeling heavy. She says this worsened and that her head, mouth, and face started shaking. Pt was having difficulty moving and some speech difficulty was noted and face was drawn.  Level 5 caveat due to severity of symptoms and urgent need for intervention.   PCP: Dr. Randell Loop at [facility] Past Medical History  Diagnosis Date  . Asthma   . Hypertension   . Degenerative disc disease   . DDD (degenerative disc disease), lumbar   . DDD (degenerative disc disease)   . Chronic back pain   . DDD (degenerative disc disease)   . Reflux   . Diabetes mellitus     Past Surgical History  Procedure Date  . Cesarean section     History reviewed. No pertinent family history.  History  Substance Use Topics  . Smoking status: Current Every Day Smoker -- 0.5 packs/day  . Smokeless tobacco: Never Used  . Alcohol Use: No    Review of Systems  Unable to perform ROS: Other  Neurological: Positive for dizziness, tremors and weakness. Negative for syncope and headaches.  All other systems reviewed and are negative.    Allergies  Erythromycin and Iodine  Home Medications   Current Outpatient Rx  Name  Route  Sig  Dispense  Refill  . ALBUTEROL SULFATE HFA 108 (90 BASE) MCG/ACT IN AERS   Inhalation   Inhale 2 puffs  into the lungs every 6 (six) hours as needed. For shortness of breath         . ALPRAZOLAM 2 MG PO TABS   Oral   Take 2 mg by mouth 3 (three) times daily.         Marland Kitchen AMLODIPINE BESYLATE 10 MG PO TABS   Oral   Take 10 mg by mouth daily.         Marland Kitchen GABAPENTIN 300 MG PO CAPS   Oral   Take 300 mg by mouth at bedtime.         . OXYCODONE HCL ER 40 MG PO TB12   Oral   Take 40 mg by mouth 3 (three) times daily.         . OXYCODONE-ACETAMINOPHEN 10-325 MG PO TABS   Oral   Take 1 tablet by mouth every 4 (four) hours as needed.           BP 136/86  Pulse 89  Temp 98.4  F (36.9 C) (Oral)  Resp 14  SpO2 99%  Physical Exam  Nursing note and vitals reviewed. Constitutional: She is oriented to person, place, and time. She appears well-developed and well-nourished. No distress.  HENT:  Head: Normocephalic and atraumatic.       Tongue appears slightly dry  Eyes: Conjunctivae normal and EOM are normal. Pupils are equal, round, and reactive to light.  Neck: Neck supple. No tracheal deviation present.  Cardiovascular: Normal rate.   Pulmonary/Chest: Effort normal. No respiratory distress.  Abdominal: She exhibits no distension.  Musculoskeletal: Normal range of motion.  Neurological: She is alert and oriented to person, place, and time. No sensory deficit.       Mild dysarthria. Displaying weakness on the left arm and and left leg. But not putting any counter effort on the right leg when trying to raise the left leg. Very good distal pulses  Skin: Skin is dry.  Psychiatric: She has a normal mood and affect. Her behavior is normal.    ED Course  Procedures (including critical care time)  CRITICAL CARE Performed by: Lear Ng.   Total critical care time: 30 min  Critical care time was exclusive of separately billable procedures and treating other patients.  Critical care was necessary to treat or prevent imminent or life-threatening deterioration.  Critical care was time spent personally by me on the following activities: development of treatment plan with patient and/or surrogate as well as nursing, discussions with consultants, evaluation of patient's response to treatment, examination of patient, obtaining history from patient or surrogate, ordering and performing treatments and interventions, ordering and review of laboratory studies, ordering and review of radiographic studies, pulse oximetry and re-evaluation of patient's condition.  DIAGNOSTIC STUDIES: Oxygen Saturation is 100% on room air, normal by my interpretation.    COORDINATION OF  CARE:    Labs Reviewed  APTT - Abnormal; Notable for the following:    aPTT 41 (*)     All other components within normal limits  CBC - Abnormal; Notable for the following:    MCH 24.6 (*)     RDW 16.3 (*)     Platelets 427 (*)     All other components within normal limits  DIFFERENTIAL - Abnormal; Notable for the following:    Neutrophils Relative 32 (*)     Lymphocytes Relative 49 (*)     Eosinophils Relative 7 (*)     All other components within normal limits  COMPREHENSIVE METABOLIC PANEL - Abnormal;  Notable for the following:    Glucose, Bld 112 (*)     Alkaline Phosphatase 137 (*)     Total Bilirubin 0.2 (*)     GFR calc non Af Amer 71 (*)     GFR calc Af Amer 82 (*)     All other components within normal limits  PROTIME-INR  TROPONIN I   Ct Head Wo Contrast  11/15/2012  *RADIOLOGY REPORT*  Clinical Data: Facial droop and left-sided weakness.  CT HEAD WITHOUT CONTRAST  Technique:  Contiguous axial images were obtained from the base of the skull through the vertex without contrast.  Comparison: 08/06/2012  Findings: There is no evidence for acute hemorrhage, midline shift, hydrocephalus, or mass lesion.  It is difficult to exclude areas of low density in the right cerebral cortex.  This could be artifactual.  As a result, an acute infarct cannot be excluded. There is mucosal disease in the right maxillary sinus.  No acute bony abnormality.  There is fluid in the right mastoid air cells.  IMPRESSION: No evidence for acute intracranial hemorrhage.  Patchy areas of low density in the right cerebral hemisphere could be artifactual.  As a result, a right sided infarct cannot be excluded and recommend clinical correlation.  Mild sinus disease.   Original Report Authenticated By: Richarda Overlie, M.D.      1. Disequilibrium   2. Weakness       MDM  I personally performed the services described in this documentation, which was scribed in my presence. The recorded information has been  reviewed and is accurate.     Pt with sudden onset of body heaviness, dysarthria, mild confusion.  Pt here with left side weakness and slightly slurred speech.  Symptoms begn within 8 hours, pt transfer to [facility] for further neurology eval.  Pt has been under more stress, argued with family earlier today, could be stress reaction as well.    Discussed with Dr. Lowella Dandy with radiology, some possible artifact on right side, no blood, shift.  I spoke to DR. Roseanne Reno who agreed to see pt at [facility].  Pt sent by [facility].  BP was mildly high, but no need for acute intervention.        Teresa Wiley. Oletta Lamas, MD 11/15/12 [phone]

## 2012-11-14 NOTE — ED Notes (Signed)
Family reports that patient was watching TV 1 hour ago and reported that she didn't feel well and went to bathroom and laid down and family noticed facial droop and left sided weakness, grip noticeably weaker. Patient slow to respond to questions but answered appropriately. Needed assistance to transport from wheelchair to stretcher.

## 2012-11-14 NOTE — ED Notes (Signed)
Patient transported to CT 

## 2012-11-14 NOTE — ED Notes (Signed)
Patient transported to CT and returned- pt transported on monitor and O2 with RN- EDP Ghim assessing pt at this time

## 2012-11-14 NOTE — ED Notes (Signed)
Update called to Leretha Dykes, RN- charge nurse- pt being transported by EMS at this time to [facility]

## 2012-11-15 ENCOUNTER — Encounter (HOSPITAL_COMMUNITY): Payer: Self-pay | Admitting: Emergency Medicine

## 2012-11-15 DIAGNOSIS — R5381 Other malaise: Secondary | ICD-10-CM

## 2012-11-15 DIAGNOSIS — R531 Weakness: Secondary | ICD-10-CM | POA: Diagnosis present

## 2012-11-15 DIAGNOSIS — R42 Dizziness and giddiness: Secondary | ICD-10-CM

## 2012-11-15 DIAGNOSIS — R5383 Other fatigue: Secondary | ICD-10-CM

## 2012-11-15 LAB — TROPONIN I: Troponin I: <0.3 ng/mL (ref <0.30)

## 2012-11-15 MED ORDER — LORAZEPAM 2 MG/ML IJ SOLN
1.0000 mg | Freq: Once | INTRAMUSCULAR | Status: AC
  Administered 2012-11-15: 1 mg via INTRAVENOUS
  Filled 2012-11-15: qty 1

## 2012-11-15 NOTE — ED Notes (Signed)
Pt very concerned about meds. She does not want any pain meds. She has a pain contract and can not receive pain meds from anywhere else.

## 2012-11-15 NOTE — Consult Note (Signed)
NEURO HOSPITALIST CONSULT NOTE    Reason for Consult: Left-sided weakness and lightheadedness.  HPI:                                                                                                                                          Teresa Wiley is an 56 y.o. female with a history of diabetes mellitus, hypertension, degenerative disc disease and chronic anxiety, presenting in code stroke status with a complaint of heaviness involving the left side and a feeling of disequilibrium. Onset of symptoms was at 2215 tonight. As no previous history of stroke nor TIA. She has not been on antiplatelet therapy. CT scan of her head showed no acute intracranial abnormality. NIH stroke score was 0. Code stroke was canceled. Patient admitted to reducing Xanax because her prescription was nearing its end and she would not be able to refill her prescription before seeing her psychologist. Is also report of significant situational stress.  Past Medical History  Diagnosis Date  . Asthma   . Hypertension   . Degenerative disc disease   . DDD (degenerative disc disease), lumbar   . DDD (degenerative disc disease)   . Chronic back pain   . DDD (degenerative disc disease)   . Reflux   . Diabetes mellitus     Past Surgical History  Procedure Date  . Cesarean section     History reviewed. No pertinent family history.   Social History:  reports that she has been smoking.  She has never used smokeless tobacco. She reports that she does not drink alcohol or use illicit drugs.  Allergies  Allergen Reactions  . Erythromycin     REACTION: nausea and vomiting  . Iodine     REACTION: Hives, throat closes    MEDICATIONS:                                                                                                                     Prior to Admission:  Albuterol inhaler Xanax 2 mg 3 times a day Norvasc 10 mg per day Vibramycin 100 mg per day Neurontin 300 mg at  bedtime OxyContin 40 mg 3 times a day Percocet 1 tablet every 4 hours when necessary   Blood pressure 138/82, pulse 87, temperature 98.4 F (36.9 C),  temperature source Oral, resp. rate 17, SpO2 95.00%.   Neurologic Examination:                                                                                                      Mental Status: Alert and moderately anxious, oriented x3.  Nonphysiologic inconsistent dysarthria. No receptive or expressive aphasia. Able to follow commands without difficulty. Cranial Nerves: II-Visual fields were normal. III/IV/VI-Pupils were equal and reacted. Extraocular movements were full and conjugate.    V/VII-no facial numbness and no facial weakness. VIII-normal. X-nonphysiologic in inconsistent dysarthria. Motor: 5/5 bilaterally with normal tone and bulk Sensory: Normal throughout. Deep Tendon Reflexes: 1+ and symmetric. Plantars: Mute bilaterally Cerebellar: Normal finger-to-nose testing.  No results found for this basename: cbc, bmp, coags, chol, tri, ldl, hga1c    Results for orders placed during the hospital encounter of 11/14/12 (from the past 48 hour(s))  PROTIME-INR     Status: Normal   Collection Time   11/14/12 11:19 PM      Component Value Range Comment   Prothrombin Time 12.0  11.6 - 15.2 seconds    INR 0.89  0.00 - 1.49   APTT     Status: Abnormal   Collection Time   11/14/12 11:19 PM      Component Value Range Comment   aPTT 41 (*) 24 - 37 seconds   CBC     Status: Abnormal   Collection Time   11/14/12 11:19 PM      Component Value Range Comment   WBC 7.7  4.0 - 10.5 K/uL    RBC 4.87  3.87 - 5.11 MIL/uL    Hemoglobin 12.0  12.0 - 15.0 g/dL    HCT 16.1  09.6 - 04.5 %    MCV 79.5  78.0 - 100.0 fL    MCH 24.6 (*) 26.0 - 34.0 pg    MCHC 31.0  30.0 - 36.0 g/dL    RDW 40.9 (*) 81.1 - 15.5 %    Platelets 427 (*) 150 - 400 K/uL   DIFFERENTIAL     Status: Abnormal   Collection Time   11/14/12 11:19 PM      Component Value  Range Comment   Neutrophils Relative 32 (*) 43 - 77 %    Neutro Abs 2.4  1.7 - 7.7 K/uL    Lymphocytes Relative 49 (*) 12 - 46 %    Lymphs Abs 3.8  0.7 - 4.0 K/uL    Monocytes Relative 12  3 - 12 %    Monocytes Absolute 0.9  0.1 - 1.0 K/uL    Eosinophils Relative 7 (*) 0 - 5 %    Eosinophils Absolute 0.6  0.0 - 0.7 K/uL    Basophils Relative 0  0 - 1 %    Basophils Absolute 0.0  0.0 - 0.1 K/uL   COMPREHENSIVE METABOLIC PANEL     Status: Abnormal   Collection Time   11/14/12 11:19 PM      Component Value Range Comment   Sodium 137  135 - 145 mEq/L    Potassium  3.9  3.5 - 5.1 mEq/L    Chloride 102  96 - 112 mEq/L    CO2 27  19 - 32 mEq/L    Glucose, Bld 112 (*) 70 - 99 mg/dL    BUN 9  6 - 23 mg/dL    Creatinine, Ser 1.61  0.50 - 1.10 mg/dL    Calcium 9.4  8.4 - 09.6 mg/dL    Total Protein 8.2  6.0 - 8.3 g/dL    Albumin 3.9  3.5 - 5.2 g/dL    AST 15  0 - 37 U/L    ALT 12  0 - 35 U/L    Alkaline Phosphatase 137 (*) 39 - 117 U/L    Total Bilirubin 0.2 (*) 0.3 - 1.2 mg/dL    GFR calc non Af Amer 71 (*) >90 mL/min    GFR calc Af Amer 82 (*) >90 mL/min   TROPONIN I     Status: Normal   Collection Time   11/14/12 11:19 PM      Component Value Range Comment   Troponin I <0.30  <0.30 ng/mL     No results found.   Assessment/Plan: No objective signs of acute stroke nor focal neurologic abnormality otherwise. Patient appears to be acutely anxious which may well be related to reducing her anxiolytic medication.  No further neurological intervention is indicated. Management of acute anxiety per ER physician.  Venetia Maxon M.D. [facility] [phone]  11/15/2012, 12:33 AM

## 2012-11-15 NOTE — ED Notes (Signed)
Pt discharged.Vital signs stable and GCS 15.Pt very tearfull .

## 2012-11-15 NOTE — Code Documentation (Signed)
56 yo bf brought in from [facility] for stroke like symptoms.  Pt with c/o Lt weakness & slurred speech at [facility] but s/s resolved by arrival at [facility].  Code Stroke called 2325, pt arrival to [facility] 2308, EDP exam 2318, stroke team arrival 2326, LSN 2215, pt arrival in CT 2318, phlebotomist arrival 2318, arrived to [facility] 0011, code stroke cancelled 0025.  NIH 0

## 2012-11-25 DIAGNOSIS — W19XXXA Unspecified fall, initial encounter: Secondary | ICD-10-CM

## 2012-11-25 HISTORY — DX: Unspecified fall, initial encounter: W19.XXXA

## 2013-07-27 ENCOUNTER — Emergency Department (HOSPITAL_BASED_OUTPATIENT_CLINIC_OR_DEPARTMENT_OTHER): Payer: Medicaid Other

## 2013-07-27 ENCOUNTER — Emergency Department (HOSPITAL_BASED_OUTPATIENT_CLINIC_OR_DEPARTMENT_OTHER)
Admission: EM | Admit: 2013-07-27 | Discharge: 2013-07-27 | Disposition: A | Discharge status: Home / Self Care | Payer: Medicaid Other | Attending: Emergency Medicine | Admitting: Emergency Medicine

## 2013-07-27 ENCOUNTER — Encounter (HOSPITAL_BASED_OUTPATIENT_CLINIC_OR_DEPARTMENT_OTHER): Payer: Self-pay | Admitting: *Deleted

## 2013-07-27 DIAGNOSIS — R05 Cough: Secondary | ICD-10-CM

## 2013-07-27 DIAGNOSIS — R6883 Chills (without fever): Secondary | ICD-10-CM | POA: Insufficient documentation

## 2013-07-27 DIAGNOSIS — E119 Type 2 diabetes mellitus without complications: Secondary | ICD-10-CM | POA: Insufficient documentation

## 2013-07-27 DIAGNOSIS — R079 Chest pain, unspecified: Secondary | ICD-10-CM | POA: Insufficient documentation

## 2013-07-27 DIAGNOSIS — I10 Essential (primary) hypertension: Secondary | ICD-10-CM | POA: Insufficient documentation

## 2013-07-27 DIAGNOSIS — Z8719 Personal history of other diseases of the digestive system: Secondary | ICD-10-CM | POA: Insufficient documentation

## 2013-07-27 DIAGNOSIS — G8929 Other chronic pain: Secondary | ICD-10-CM | POA: Insufficient documentation

## 2013-07-27 DIAGNOSIS — IMO0002 Reserved for concepts with insufficient information to code with codable children: Secondary | ICD-10-CM | POA: Insufficient documentation

## 2013-07-27 DIAGNOSIS — Z79899 Other long term (current) drug therapy: Secondary | ICD-10-CM | POA: Insufficient documentation

## 2013-07-27 DIAGNOSIS — F17200 Nicotine dependence, unspecified, uncomplicated: Secondary | ICD-10-CM | POA: Insufficient documentation

## 2013-07-27 DIAGNOSIS — Z8739 Personal history of other diseases of the musculoskeletal system and connective tissue: Secondary | ICD-10-CM | POA: Insufficient documentation

## 2013-07-27 DIAGNOSIS — R0781 Pleurodynia: Secondary | ICD-10-CM

## 2013-07-27 DIAGNOSIS — J45909 Unspecified asthma, uncomplicated: Secondary | ICD-10-CM | POA: Insufficient documentation

## 2013-07-27 MED ORDER — ALBUTEROL SULFATE (5 MG/ML) 0.5% IN NEBU
INHALATION_SOLUTION | RESPIRATORY_TRACT | Status: AC
  Administered 2013-07-27: 5 mg via RESPIRATORY_TRACT
  Filled 2013-07-27: qty 1

## 2013-07-27 MED ORDER — HYDROCOD POLST-CHLORPHEN POLST 10-8 MG/5ML PO LQCR: 5.0000 mL | Freq: Two times a day (BID) | ORAL | Status: DC | PRN

## 2013-07-27 MED ORDER — IPRATROPIUM BROMIDE 0.02 % IN SOLN
RESPIRATORY_TRACT | Status: AC
  Administered 2013-07-27: 0.5 mg via RESPIRATORY_TRACT
  Filled 2013-07-27: qty 2.5

## 2013-07-27 MED ORDER — IPRATROPIUM BROMIDE 0.02 % IN SOLN
0.5000 mg | Freq: Once | RESPIRATORY_TRACT | Status: AC
  Administered 2013-07-27: 0.5 mg via RESPIRATORY_TRACT

## 2013-07-27 MED ORDER — ALBUTEROL SULFATE (5 MG/ML) 0.5% IN NEBU
5.0000 mg | INHALATION_SOLUTION | Freq: Once | RESPIRATORY_TRACT | Status: AC
  Administered 2013-07-27: 5 mg via RESPIRATORY_TRACT

## 2013-07-27 NOTE — ED Provider Notes (Signed)
CSN: [id number]     Arrival date & time 07/27/13  1726 History   First MD Initiated Contact with Patient 07/27/13 1739     Chief Complaint  Patient presents with  . Cough  . Nasal Congestion   (Consider location/radiation/quality/duration/timing/severity/associated sxs/prior Treatment) HPI Comments: Pt states that she fell and then she started having rib pain and a cough:denies fever:cough is productive  Patient is a 57 y.o. female presenting with cough. The history is provided by the patient. No language interpreter was used.  Cough Cough characteristics:  Productive Sputum characteristics:  Nondescript Severity:  Moderate Onset quality:  Sudden Duration:  2 days Timing:  Constant Chronicity:  Recurrent Smoker: yes   Relieved by:  Nothing Ineffective treatments:  Beta-agonist inhaler Associated symptoms: chills   Associated symptoms: no fever     Past Medical History  Diagnosis Date  . Asthma   . Hypertension   . Degenerative disc disease   . DDD (degenerative disc disease), lumbar   . DDD (degenerative disc disease)   . Chronic back pain   . DDD (degenerative disc disease)   . Reflux   . Diabetes mellitus    Past Surgical History  Procedure Laterality Date  . Cesarean section     History reviewed. No pertinent family history. History  Substance Use Topics  . Smoking status: Current Every Day Smoker -- 0.50 packs/day  . Smokeless tobacco: Never Used  . Alcohol Use: No   OB History   Grav Para Term Preterm Abortions TAB SAB Ect Mult Living                 Review of Systems  Constitutional: Positive for chills. Negative for fever.  Respiratory: Positive for cough.   Cardiovascular: Negative.     Allergies  Erythromycin; Iodine; and Shellfish allergy  Home Medications   Current Outpatient Rx  Name  Route  Sig  Dispense  Refill  . fluticasone (FLOVENT DISKUS) 50 MCG/BLIST diskus inhaler   Inhalation   Inhale 1 puff into the lungs 2 (two) times  daily.         Marland Kitchen albuterol (PROVENTIL HFA;VENTOLIN HFA) 108 (90 BASE) MCG/ACT inhaler   Inhalation   Inhale 2 puffs into the lungs every 6 (six) hours as needed. For shortness of breath         . alprazolam (XANAX) 2 MG tablet   Oral   Take 2 mg by mouth 3 (three) times daily.         Marland Kitchen amLODipine (NORVASC) 10 MG tablet   Oral   Take 10 mg by mouth daily.         Marland Kitchen gabapentin (NEURONTIN) 300 MG capsule   Oral   Take 300 mg by mouth at bedtime.         Marland Kitchen oxyCODONE (OXYCONTIN) 40 MG 12 hr tablet   Oral   Take 40 mg by mouth 3 (three) times daily.         Marland Kitchen oxyCODONE-acetaminophen (PERCOCET) 10-325 MG per tablet   Oral   Take 1 tablet by mouth every 4 (four) hours as needed.          BP 144/76  Pulse 96  Temp(Src) 98.2 F (36.8 C) (Oral)  Resp 20  SpO2 95% Physical Exam  Nursing note and vitals reviewed. Constitutional: She is oriented to person, place, and time. She appears well-developed and well-nourished.  HENT:  Head: Normocephalic and atraumatic.  Right Ear: External ear normal.  Left Ear: External  ear normal.  Eyes: Conjunctivae and EOM are normal. Pupils are equal, round, and reactive to light.  Neck: Normal range of motion. Neck supple.  Cardiovascular: Normal rate and regular rhythm.   Pulmonary/Chest: No respiratory distress. She has wheezes.  Musculoskeletal: Normal range of motion.  Neurological: She is alert and oriented to person, place, and time.  Skin: Skin is warm and dry.  Psychiatric: She has a normal mood and affect.    ED Course  Procedures (including critical care time) Labs Review Labs Reviewed - No data to display Imaging Review Dg Ribs Unilateral W/chest Left  07/27/2013   *RADIOLOGY REPORT*  Clinical Data: Fall.  Left lower anterior rib pain  LEFT RIBS AND CHEST - 3+ VIEW  Comparison: Chest 08/17/2013  Findings: Mild atelectasis in the lung bases.  No effusion or pneumothorax.  Negative for heart failure.  Mild motion on the  rib detail degrades image quality.  Allowing for this no rib fracture identified.  IMPRESSION: Mild bibasilar atelectasis.  Negative for left rib fracture.   Original Report Authenticated By: Janeece Riggers, M.D.    MDM   1. Cough   2. Rib pain    Pt is not longer wheezing after treatment:no infection or rib fracture noted:will treat symptomatically  Teressa Lower, NP 07/27/13 1948

## 2013-07-27 NOTE — ED Provider Notes (Signed)
Medical screening examination/treatment/procedure(s) were performed by non-physician practitioner and as supervising physician I was immediately available for consultation/collaboration.   Richardean Canal, MD 07/27/13 [phone]

## 2013-07-27 NOTE — ED Notes (Signed)
Pt amb to triage with slow, steady gait in nad. Pt reports 2 days of cough, rt in to asess pt.

## 2013-09-02 IMAGING — CR DG RIBS W/ CHEST 3+V*L*
3 series · 3 of 3 positions shown · non-contrast
Comparison: Chest 08/17/2013

CLINICAL DATA: Fall.  Left lower anterior rib pain

LEFT RIBS AND CHEST - 3+ VIEW

[w chest pa]
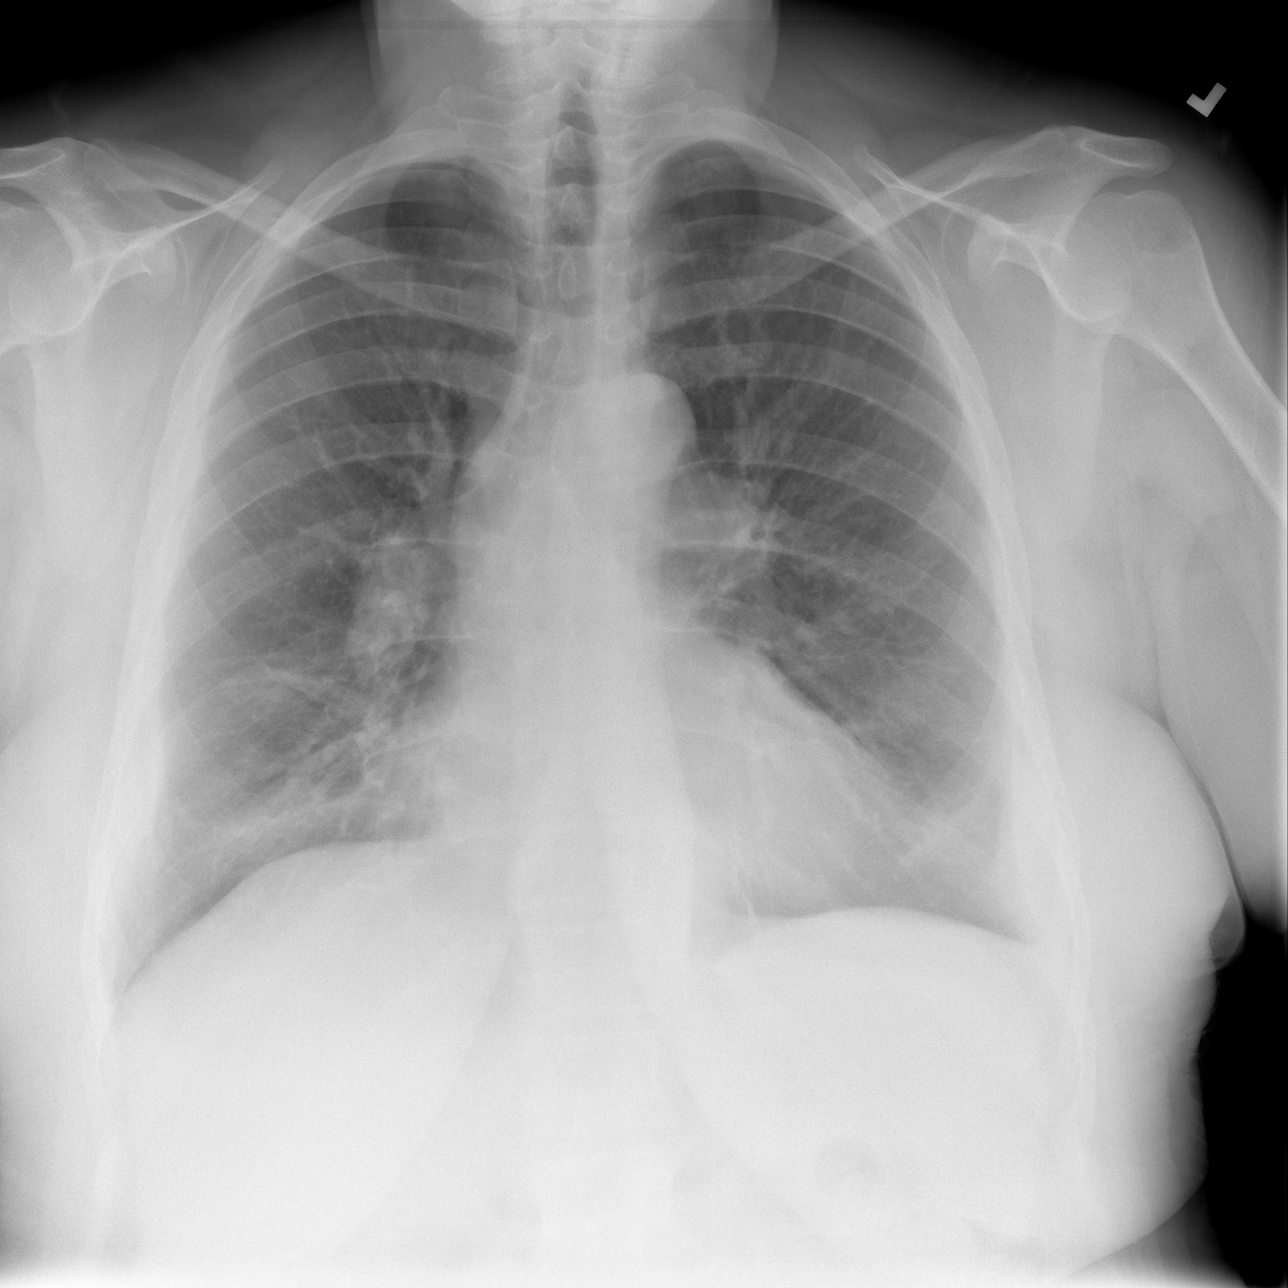

[w ribs ap/pa lower left]
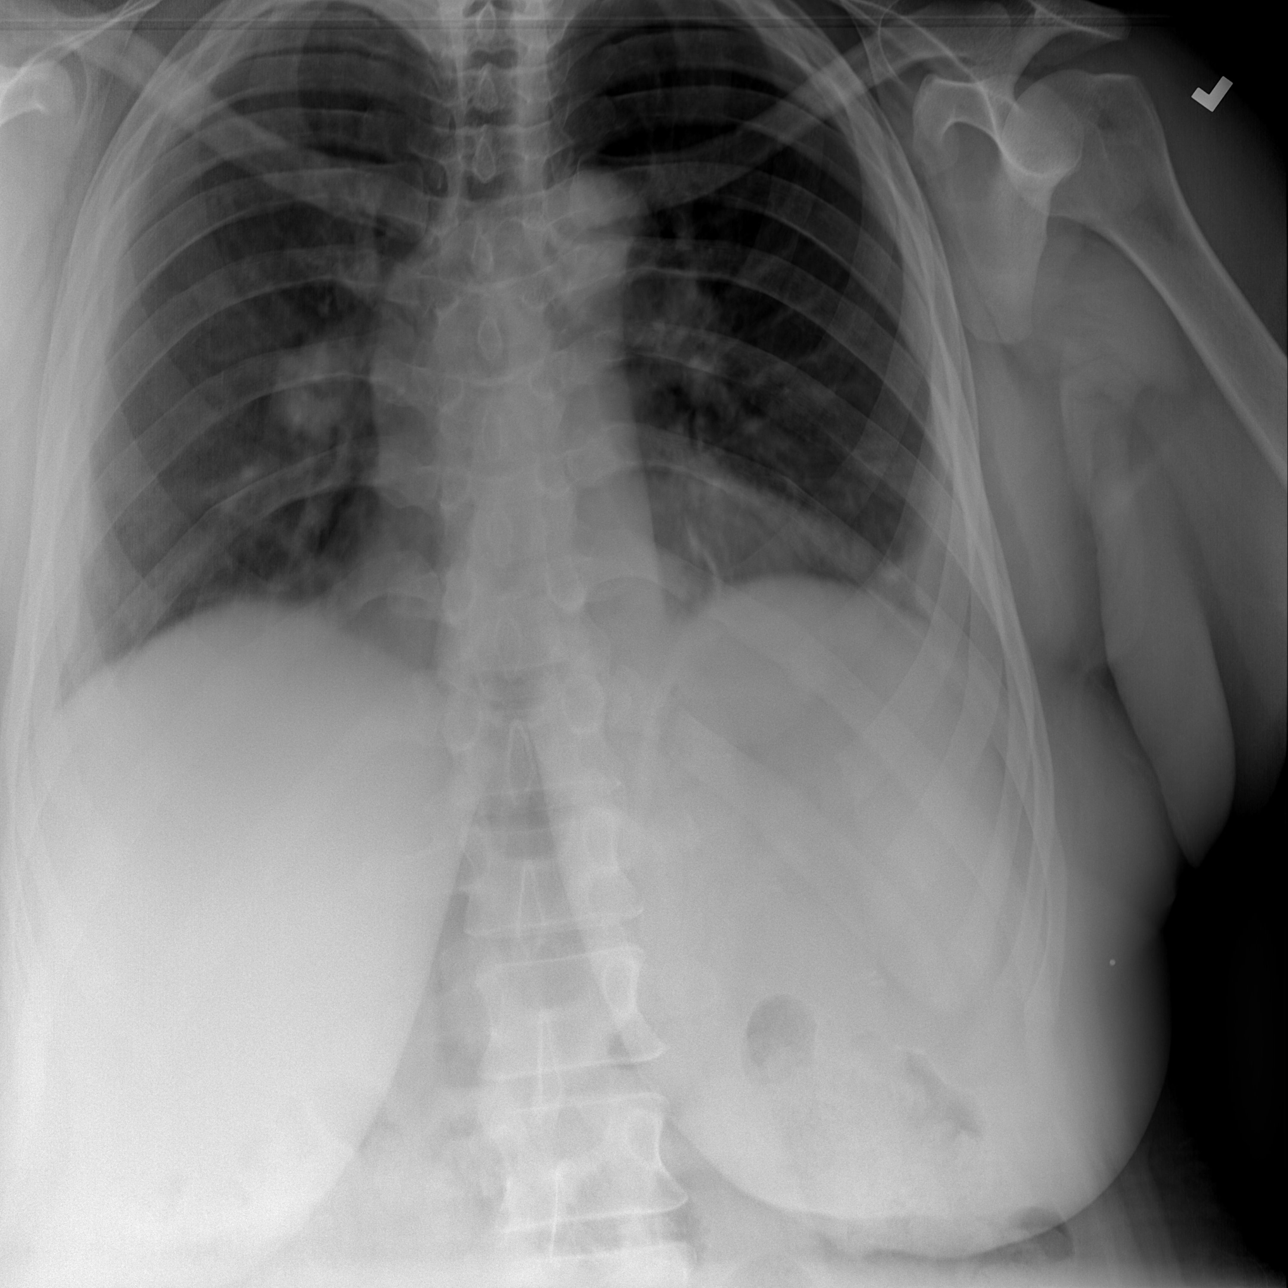

[w ribs oblique left]
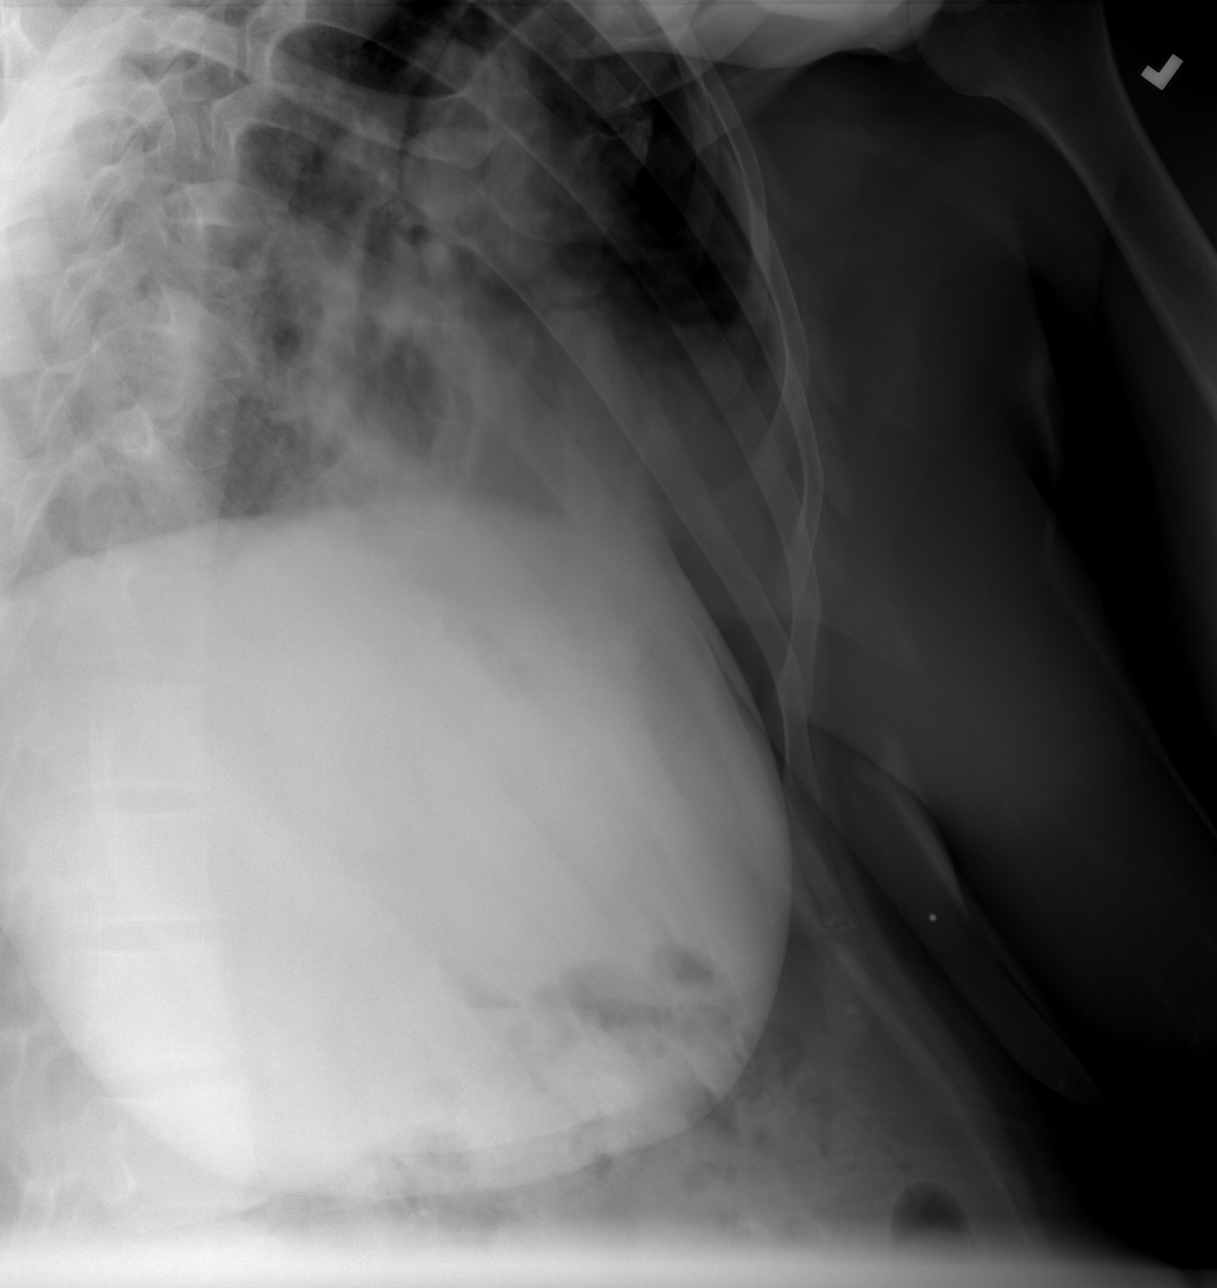

[3 of 3 positions shown; findings below may reference images not displayed]

FINDINGS: Mild atelectasis in the lung bases.  No effusion or
pneumothorax.  Negative for heart failure.

Mild motion on the rib detail degrades image quality.  Allowing for
this no rib fracture identified.
IMPRESSION: Mild bibasilar atelectasis.  Negative for left rib fracture.

## 2014-01-02 ENCOUNTER — Encounter (HOSPITAL_COMMUNITY): Payer: Self-pay | Admitting: Emergency Medicine

## 2014-01-02 ENCOUNTER — Emergency Department (HOSPITAL_COMMUNITY)
Admission: EM | Admit: 2014-01-02 | Discharge: 2014-01-02 | Disposition: A | Discharge status: Home / Self Care | Payer: Medicaid Other | Attending: Emergency Medicine | Admitting: Emergency Medicine

## 2014-01-02 DIAGNOSIS — M549 Dorsalgia, unspecified: Secondary | ICD-10-CM | POA: Insufficient documentation

## 2014-01-02 DIAGNOSIS — J45909 Unspecified asthma, uncomplicated: Secondary | ICD-10-CM | POA: Insufficient documentation

## 2014-01-02 DIAGNOSIS — E669 Obesity, unspecified: Secondary | ICD-10-CM | POA: Insufficient documentation

## 2014-01-02 DIAGNOSIS — M51379 Other intervertebral disc degeneration, lumbosacral region without mention of lumbar back pain or lower extremity pain: Secondary | ICD-10-CM | POA: Insufficient documentation

## 2014-01-02 DIAGNOSIS — G8929 Other chronic pain: Secondary | ICD-10-CM | POA: Insufficient documentation

## 2014-01-02 DIAGNOSIS — M5137 Other intervertebral disc degeneration, lumbosacral region: Secondary | ICD-10-CM | POA: Insufficient documentation

## 2014-01-02 DIAGNOSIS — R5383 Other fatigue: Secondary | ICD-10-CM

## 2014-01-02 DIAGNOSIS — R5381 Other malaise: Secondary | ICD-10-CM | POA: Insufficient documentation

## 2014-01-02 DIAGNOSIS — F17200 Nicotine dependence, unspecified, uncomplicated: Secondary | ICD-10-CM | POA: Insufficient documentation

## 2014-01-02 DIAGNOSIS — Z8719 Personal history of other diseases of the digestive system: Secondary | ICD-10-CM | POA: Insufficient documentation

## 2014-01-02 DIAGNOSIS — E119 Type 2 diabetes mellitus without complications: Secondary | ICD-10-CM | POA: Insufficient documentation

## 2014-01-02 DIAGNOSIS — Z79899 Other long term (current) drug therapy: Secondary | ICD-10-CM | POA: Insufficient documentation

## 2014-01-02 DIAGNOSIS — IMO0002 Reserved for concepts with insufficient information to code with codable children: Secondary | ICD-10-CM | POA: Insufficient documentation

## 2014-01-02 DIAGNOSIS — R739 Hyperglycemia, unspecified: Secondary | ICD-10-CM

## 2014-01-02 DIAGNOSIS — I10 Essential (primary) hypertension: Secondary | ICD-10-CM | POA: Insufficient documentation

## 2014-01-02 LAB — URINALYSIS, ROUTINE W REFLEX MICROSCOPIC
Bilirubin Urine: NEGATIVE
HGB URINE DIPSTICK: NEGATIVE
Ketones, ur: NEGATIVE mg/dL
LEUKOCYTES UA: NEGATIVE
Nitrite: NEGATIVE
PROTEIN: NEGATIVE mg/dL
SPECIFIC GRAVITY, URINE: 1.019 (ref 1.005–1.030)
Urobilinogen, UA: 0.2 mg/dL (ref 0.0–1.0)
pH: 5.5 (ref 5.0–8.0)

## 2014-01-02 LAB — CBC WITH DIFFERENTIAL/PLATELET
BASOS ABS: 0 10*3/uL (ref 0.0–0.1)
Basophils Relative: 0 % (ref 0–1)
Eosinophils Absolute: 0.2 10*3/uL (ref 0.0–0.7)
Eosinophils Relative: 2 % (ref 0–5)
HCT: 38.1 % (ref 36.0–46.0)
Hemoglobin: 11.7 g/dL — ABNORMAL LOW (ref 12.0–15.0)
LYMPHS ABS: 3.2 10*3/uL (ref 0.7–4.0)
LYMPHS PCT: 36 % (ref 12–46)
MCH: 25.6 pg — ABNORMAL LOW (ref 26.0–34.0)
MCHC: 30.7 g/dL (ref 30.0–36.0)
MCV: 83.4 fL (ref 78.0–100.0)
Monocytes Absolute: 0.6 10*3/uL (ref 0.1–1.0)
Monocytes Relative: 7 % (ref 3–12)
NEUTROS ABS: 5 10*3/uL (ref 1.7–7.7)
NEUTROS PCT: 55 % (ref 43–77)
PLATELETS: 408 10*3/uL — AB (ref 150–400)
RBC: 4.57 MIL/uL (ref 3.87–5.11)
RDW: 16.9 % — AB (ref 11.5–15.5)
WBC: 9 10*3/uL (ref 4.0–10.5)

## 2014-01-02 LAB — BASIC METABOLIC PANEL
BUN: 11 mg/dL (ref 6–23)
CALCIUM: 9.1 mg/dL (ref 8.4–10.5)
CHLORIDE: 95 meq/L — AB (ref 96–112)
CO2: 27 meq/L (ref 19–32)
Creatinine, Ser: 0.78 mg/dL (ref 0.50–1.10)
GFR calc Af Amer: >90 mL/min (ref >90)
GFR calc non Af Amer: >90 mL/min (ref >90)
GLUCOSE: 308 mg/dL — AB (ref 70–99)
POTASSIUM: 4.3 meq/L (ref 3.7–5.3)
SODIUM: 136 meq/L — AB (ref 137–147)

## 2014-01-02 LAB — URINE MICROSCOPIC-ADD ON

## 2014-01-02 LAB — GLUCOSE, CAPILLARY
GLUCOSE-CAPILLARY: 307 mg/dL — AB (ref 70–99)
GLUCOSE-CAPILLARY: 252 mg/dL — AB (ref 70–99)

## 2014-01-02 MED ORDER — INSULIN ASPART 100 UNIT/ML ~~LOC~~ SOLN
10.0000 [IU] | Freq: Once | SUBCUTANEOUS | Status: AC
  Administered 2014-01-02: 10 [IU] via SUBCUTANEOUS
  Filled 2014-01-02: qty 1

## 2014-01-02 MED ORDER — SODIUM CHLORIDE 0.9 % IV BOLUS (SEPSIS)
1000.0000 mL | Freq: Once | INTRAVENOUS | Status: AC
  Administered 2014-01-02: 1000 mL via INTRAVENOUS

## 2014-01-02 MED ORDER — OXYCODONE HCL ER 10 MG PO T12A
40.0000 mg | EXTENDED_RELEASE_TABLET | Freq: Three times a day (TID) | ORAL | Status: DC
  Administered 2014-01-02: 40 mg via ORAL
  Filled 2014-01-02: qty 4

## 2014-01-02 MED ORDER — ALPRAZOLAM 0.5 MG PO TABS: 2.0000 mg | ORAL_TABLET | Freq: Three times a day (TID) | ORAL | Status: DC

## 2014-01-02 MED ORDER — OXYCODONE HCL ER 10 MG PO T12A: 40.0000 mg | EXTENDED_RELEASE_TABLET | Freq: Three times a day (TID) | ORAL | Status: DC

## 2014-01-02 MED ORDER — GABAPENTIN 300 MG PO CAPS
300.0000 mg | ORAL_CAPSULE | Freq: Every day | ORAL | Status: DC
  Filled 2014-01-02: qty 1

## 2014-01-02 MED ORDER — GABAPENTIN 300 MG PO CAPS
300.0000 mg | ORAL_CAPSULE | Freq: Every day | ORAL | Status: DC
  Administered 2014-01-02: 300 mg via ORAL
  Filled 2014-01-02: qty 1

## 2014-01-02 NOTE — Discharge Instructions (Signed)
Follow up with your doctor for any medication adjustments. Continue to take your prescribed medications. Control your blood sugar with diet. Refer to attached documents for more information. Return to the ED with worsening or concerning symptoms.

## 2014-01-02 NOTE — ED Notes (Signed)
Pt. aware of giving a urine specimen. Pt. Unable to urinate at this time. Nurse was notified.

## 2014-01-02 NOTE — ED Notes (Signed)
Patient has pain contract with her primary doctor and would like pain medication but wants to make sure its within her contract. Patient usually takes her pain medications at 1730 daily.

## 2014-01-02 NOTE — ED Notes (Signed)
Patient in via EMS from home. Patient was recently dx with DM and still trying different medications to se what works. Patient woke up at 0300 feel bad and this afternoon she felt distant like she could hear people talking but they seemed along way away. Patient c/o dizziness and HA. Denies N/V and SOB.

## 2014-01-02 NOTE — ED Provider Notes (Signed)
CSN: [id number]     Arrival date & time 01/02/14  1637 History   First MD Initiated Contact with Patient 01/02/14 1656     Chief Complaint  Patient presents with  . Hyperglycemia  . Weakness   (Consider location/radiation/quality/duration/timing/severity/associated sxs/prior Treatment) Patient is a 58 y.o. female presenting with hyperglycemia. The history is provided by the patient. No language interpreter was used.  Hyperglycemia Blood sugar level PTA:  Unknown Severity:  Moderate Onset quality:  Gradual Duration:  12 hours Timing:  Intermittent Progression:  Unchanged Chronicity:  New Diabetes status:  Controlled with oral medications Current diabetic therapy:  Metformin Time since last antidiabetic medication:  8 hours Context: new diabetes diagnosis   Context: not change in medication and not insulin pump use   Relieved by:  Nothing Ineffective treatments:  Oral agents Associated symptoms: weakness   Associated symptoms: no abdominal pain, no chest pain, no dizziness, no dysuria, no fatigue, no fever, no nausea, no shortness of breath and no vomiting   Weakness:    Severity:  Moderate   Onset quality:  Gradual   Duration:  8 hours   Timing:  Constant   Progression:  Unchanged   Chronicity:  New Risk factors: obesity   Risk factors: no family hx of diabetes, no hx of DKA, no pancreatic disease and no recent steroid use     Past Medical History  Diagnosis Date  . Asthma   . Hypertension   . Degenerative disc disease   . DDD (degenerative disc disease), lumbar   . DDD (degenerative disc disease)   . Chronic back pain   . DDD (degenerative disc disease)   . Reflux   . Diabetes mellitus    Past Surgical History  Procedure Laterality Date  . Cesarean section     No family history on file. History  Substance Use Topics  . Smoking status: Current Every Day Smoker -- 0.50 packs/day  . Smokeless tobacco: Never Used  . Alcohol Use: No   OB History   Grav Para  Term Preterm Abortions TAB SAB Ect Mult Living                 Review of Systems  Constitutional: Negative for fever, chills and fatigue.  HENT: Negative for trouble swallowing.   Eyes: Negative for visual disturbance.  Respiratory: Negative for shortness of breath.   Cardiovascular: Negative for chest pain and palpitations.  Gastrointestinal: Negative for nausea, vomiting, abdominal pain and diarrhea.  Genitourinary: Negative for dysuria and difficulty urinating.  Musculoskeletal: Negative for arthralgias and neck pain.  Skin: Negative for color change.  Neurological: Positive for weakness. Negative for dizziness.  Psychiatric/Behavioral: Negative for dysphoric mood.    Allergies  Erythromycin; Iodine; and Shellfish allergy  Home Medications   Current Outpatient Rx  Name  Route  Sig  Dispense  Refill  . albuterol (PROVENTIL HFA;VENTOLIN HFA) 108 (90 BASE) MCG/ACT inhaler   Inhalation   Inhale 2 puffs into the lungs every 6 (six) hours as needed. For shortness of breath         . alprazolam (XANAX) 2 MG tablet   Oral   Take 2 mg by mouth 3 (three) times daily.         Marland Kitchen amLODipine (NORVASC) 10 MG tablet   Oral   Take 10 mg by mouth daily.         . chlorpheniramine-HYDROcodone (TUSSIONEX PENNKINETIC ER) 10-8 MG/5ML LQCR   Oral   Take 5 mLs by mouth  every 12 (twelve) hours as needed.   140 mL   0   . fluticasone (FLOVENT DISKUS) 50 MCG/BLIST diskus inhaler   Inhalation   Inhale 1 puff into the lungs 2 (two) times daily.         Marland Kitchen gabapentin (NEURONTIN) 300 MG capsule   Oral   Take 300 mg by mouth at bedtime.         Marland Kitchen oxyCODONE (OXYCONTIN) 40 MG 12 hr tablet   Oral   Take 40 mg by mouth 3 (three) times daily.         Marland Kitchen oxyCODONE-acetaminophen (PERCOCET) 10-325 MG per tablet   Oral   Take 1 tablet by mouth every 4 (four) hours as needed.          BP 97/78  Pulse 103  Temp(Src) 98.8 F (37.1 C) (Oral)  Resp 16  SpO2 99% Physical Exam   Nursing note and vitals reviewed. Constitutional: She appears well-developed and well-nourished. No distress.  HENT:  Head: Normocephalic and atraumatic.  Eyes: Conjunctivae and EOM are normal.  Neck: Normal range of motion.  Cardiovascular: Normal rate and regular rhythm.  Exam reveals no gallop and no friction rub.   No murmur heard. Pulmonary/Chest: Effort normal and breath sounds normal. She has no wheezes. She has no rales. She exhibits no tenderness.  Abdominal: Soft. She exhibits no distension. There is no tenderness. There is no rebound and no guarding.  Musculoskeletal: Normal range of motion.  Neurological: She is alert. Coordination normal.  Speech is goal-oriented. Moves limbs without ataxia.   Skin: Skin is warm and dry.  Psychiatric: She has a normal mood and affect. Her behavior is normal.    ED Course  Procedures (including critical care time) Labs Review Labs Reviewed  GLUCOSE, CAPILLARY - Abnormal; Notable for the following:    Glucose-Capillary 307 (*)    All other components within normal limits  CBC WITH DIFFERENTIAL - Abnormal; Notable for the following:    Hemoglobin 11.7 (*)    MCH 25.6 (*)    RDW 16.9 (*)    Platelets 408 (*)    All other components within normal limits  BASIC METABOLIC PANEL - Abnormal; Notable for the following:    Sodium 136 (*)    Chloride 95 (*)    Glucose, Bld 308 (*)    All other components within normal limits  URINALYSIS, ROUTINE W REFLEX MICROSCOPIC - Abnormal; Notable for the following:    Glucose, UA >1000 (*)    All other components within normal limits  URINE MICROSCOPIC-ADD ON - Abnormal; Notable for the following:    Bacteria, UA FEW (*)    All other components within normal limits  GLUCOSE, CAPILLARY - Abnormal; Notable for the following:    Glucose-Capillary 252 (*)    All other components within normal limits   Imaging Review No results found.  EKG Interpretation   None       MDM   1. Hyperglycemia      6:24 PM Patient's blood sugar on arrival is 307. Patient will have fluids. Labs pending. Vitals stable and patient afebrile.   8:58 PM Patient's BG has gone down to 252. Patient will have 10 units of insulin and be discharged. Patient advised to follow up with her PCP for further evaluation. Patient is not in DKA. Anion gap is 14 however she is not spilling ketones and she "feels better." Patient instructed to return with worsening or concerning symptoms. No further evaluation needed  at this time.     Alvina Chou, [place] 01/02/14 2111

## 2014-01-02 NOTE — ED Provider Notes (Signed)
Medical screening examination/treatment/procedure(s) were performed by non-physician practitioner and as supervising physician I was immediately available for consultation/collaboration.   Leota Jacobsen, MD 01/02/14 [phone]

## 2014-01-05 LAB — URINE CULTURE: Colony Count: 70000

## 2014-03-08 ENCOUNTER — Emergency Department (HOSPITAL_BASED_OUTPATIENT_CLINIC_OR_DEPARTMENT_OTHER)
Admission: EM | Admit: 2014-03-08 | Discharge: 2014-03-09 | Disposition: A | Discharge status: Home / Self Care | Payer: Medicaid Other | Attending: Emergency Medicine | Admitting: Emergency Medicine

## 2014-03-08 ENCOUNTER — Emergency Department (HOSPITAL_BASED_OUTPATIENT_CLINIC_OR_DEPARTMENT_OTHER): Payer: Medicaid Other

## 2014-03-08 ENCOUNTER — Encounter (HOSPITAL_BASED_OUTPATIENT_CLINIC_OR_DEPARTMENT_OTHER): Payer: Self-pay | Admitting: Emergency Medicine

## 2014-03-08 DIAGNOSIS — K828 Other specified diseases of gallbladder: Secondary | ICD-10-CM | POA: Diagnosis present

## 2014-03-08 DIAGNOSIS — R109 Unspecified abdominal pain: Secondary | ICD-10-CM | POA: Diagnosis present

## 2014-03-08 DIAGNOSIS — F17200 Nicotine dependence, unspecified, uncomplicated: Secondary | ICD-10-CM | POA: Insufficient documentation

## 2014-03-08 DIAGNOSIS — F411 Generalized anxiety disorder: Secondary | ICD-10-CM | POA: Insufficient documentation

## 2014-03-08 DIAGNOSIS — IMO0002 Reserved for concepts with insufficient information to code with codable children: Secondary | ICD-10-CM | POA: Insufficient documentation

## 2014-03-08 DIAGNOSIS — E119 Type 2 diabetes mellitus without complications: Secondary | ICD-10-CM

## 2014-03-08 DIAGNOSIS — K7581 Nonalcoholic steatohepatitis (NASH): Secondary | ICD-10-CM | POA: Diagnosis present

## 2014-03-08 DIAGNOSIS — R32 Unspecified urinary incontinence: Secondary | ICD-10-CM | POA: Diagnosis present

## 2014-03-08 DIAGNOSIS — M51379 Other intervertebral disc degeneration, lumbosacral region without mention of lumbar back pain or lower extremity pain: Secondary | ICD-10-CM | POA: Insufficient documentation

## 2014-03-08 DIAGNOSIS — F419 Anxiety disorder, unspecified: Secondary | ICD-10-CM | POA: Diagnosis present

## 2014-03-08 DIAGNOSIS — E669 Obesity, unspecified: Secondary | ICD-10-CM

## 2014-03-08 DIAGNOSIS — K219 Gastro-esophageal reflux disease without esophagitis: Secondary | ICD-10-CM | POA: Insufficient documentation

## 2014-03-08 DIAGNOSIS — K8000 Calculus of gallbladder with acute cholecystitis without obstruction: Secondary | ICD-10-CM | POA: Insufficient documentation

## 2014-03-08 DIAGNOSIS — M549 Dorsalgia, unspecified: Secondary | ICD-10-CM

## 2014-03-08 DIAGNOSIS — K8020 Calculus of gallbladder without cholecystitis without obstruction: Secondary | ICD-10-CM

## 2014-03-08 DIAGNOSIS — M533 Sacrococcygeal disorders, not elsewhere classified: Secondary | ICD-10-CM

## 2014-03-08 DIAGNOSIS — G4736 Sleep related hypoventilation in conditions classified elsewhere: Secondary | ICD-10-CM

## 2014-03-08 DIAGNOSIS — D249 Benign neoplasm of unspecified breast: Secondary | ICD-10-CM

## 2014-03-08 DIAGNOSIS — I10 Essential (primary) hypertension: Secondary | ICD-10-CM

## 2014-03-08 DIAGNOSIS — Z720 Tobacco use: Secondary | ICD-10-CM | POA: Diagnosis present

## 2014-03-08 DIAGNOSIS — R0789 Other chest pain: Secondary | ICD-10-CM | POA: Diagnosis present

## 2014-03-08 DIAGNOSIS — G8929 Other chronic pain: Secondary | ICD-10-CM | POA: Insufficient documentation

## 2014-03-08 DIAGNOSIS — M5137 Other intervertebral disc degeneration, lumbosacral region: Secondary | ICD-10-CM | POA: Insufficient documentation

## 2014-03-08 HISTORY — DX: Anxiety disorder, unspecified: F41.9

## 2014-03-08 HISTORY — DX: Morbid (severe) obesity due to excess calories: E66.01

## 2014-03-08 HISTORY — DX: Unspecified fall, initial encounter: W19.XXXA

## 2014-03-08 LAB — CBC WITH DIFFERENTIAL/PLATELET
Basophils Absolute: 0 10*3/uL (ref 0.0–0.1)
Basophils Relative: 0 % (ref 0–1)
EOS PCT: 2 % (ref 0–5)
Eosinophils Absolute: 0.2 10*3/uL (ref 0.0–0.7)
HEMATOCRIT: 41.7 % (ref 36.0–46.0)
Hemoglobin: 13.3 g/dL (ref 12.0–15.0)
LYMPHS PCT: 41 % (ref 12–46)
Lymphs Abs: 3.5 10*3/uL (ref 0.7–4.0)
MCH: 26.9 pg (ref 26.0–34.0)
MCHC: 31.9 g/dL (ref 30.0–36.0)
MCV: 84.2 fL (ref 78.0–100.0)
MONO ABS: 0.8 10*3/uL (ref 0.1–1.0)
Monocytes Relative: 9 % (ref 3–12)
Neutro Abs: 4 10*3/uL (ref 1.7–7.7)
Neutrophils Relative %: 48 % (ref 43–77)
PLATELETS: 420 10*3/uL — AB (ref 150–400)
RBC: 4.95 MIL/uL (ref 3.87–5.11)
RDW: 15.8 % — ABNORMAL HIGH (ref 11.5–15.5)
WBC: 8.5 10*3/uL (ref 4.0–10.5)

## 2014-03-08 LAB — I-STAT CG4 LACTIC ACID, ED: Lactic Acid, Venous: 1.39 mmol/L (ref 0.5–2.2)

## 2014-03-08 LAB — URINALYSIS, ROUTINE W REFLEX MICROSCOPIC
Bilirubin Urine: NEGATIVE
GLUCOSE, UA: NEGATIVE mg/dL
HGB URINE DIPSTICK: NEGATIVE
Ketones, ur: NEGATIVE mg/dL
Leukocytes, UA: NEGATIVE
Nitrite: NEGATIVE
Protein, ur: NEGATIVE mg/dL
SPECIFIC GRAVITY, URINE: 1.018 (ref 1.005–1.030)
Urobilinogen, UA: 1 mg/dL (ref 0.0–1.0)
pH: 6.5 (ref 5.0–8.0)

## 2014-03-08 LAB — COMPREHENSIVE METABOLIC PANEL
ALT: 18 U/L (ref 0–35)
AST: 16 U/L (ref 0–37)
Albumin: 4 g/dL (ref 3.5–5.2)
Alkaline Phosphatase: 159 U/L — ABNORMAL HIGH (ref 39–117)
BILIRUBIN TOTAL: 0.3 mg/dL (ref 0.3–1.2)
BUN: 8 mg/dL (ref 6–23)
CALCIUM: 9.8 mg/dL (ref 8.4–10.5)
CO2: 24 meq/L (ref 19–32)
Chloride: 101 mEq/L (ref 96–112)
Creatinine, Ser: 0.7 mg/dL (ref 0.50–1.10)
GFR calc non Af Amer: >90 mL/min (ref >90)
GLUCOSE: 134 mg/dL — AB (ref 70–99)
Potassium: 4.5 mEq/L (ref 3.7–5.3)
SODIUM: 140 meq/L (ref 137–147)
Total Protein: 8.5 g/dL — ABNORMAL HIGH (ref 6.0–8.3)

## 2014-03-08 LAB — LIPASE, BLOOD: Lipase: 21 U/L (ref 11–59)

## 2014-03-08 LAB — CBG MONITORING, ED: GLUCOSE-CAPILLARY: 148 mg/dL — AB (ref 70–99)

## 2014-03-08 MED ORDER — HYDROMORPHONE HCL PF 1 MG/ML IJ SOLN
1.0000 mg | Freq: Once | INTRAMUSCULAR | Status: AC
Start: 2014-03-08 — End: 2014-03-08
  Administered 2014-03-08: 1 mg via INTRAVENOUS
  Filled 2014-03-08: qty 1

## 2014-03-08 MED ORDER — SODIUM CHLORIDE 0.9 % IV SOLN
Freq: Once | INTRAVENOUS | Status: AC
  Administered 2014-03-08: via INTRAVENOUS

## 2014-03-08 MED ORDER — HYDROMORPHONE HCL PF 1 MG/ML IJ SOLN
1.0000 mg | Freq: Once | INTRAMUSCULAR | Status: AC
  Administered 2014-03-08: 1 mg via INTRAVENOUS
  Filled 2014-03-08: qty 1

## 2014-03-08 MED ORDER — HYDROMORPHONE HCL PF 1 MG/ML IJ SOLN
1.0000 mg | INTRAMUSCULAR | Status: DC | PRN
  Administered 2014-03-09: 1 mg via INTRAVENOUS
  Filled 2014-03-08: qty 1

## 2014-03-08 MED ORDER — ALPRAZOLAM 0.5 MG PO TABS
1.0000 mg | ORAL_TABLET | Freq: Once | ORAL | Status: AC
  Administered 2014-03-08: 1 mg via ORAL
  Filled 2014-03-08: qty 2

## 2014-03-08 MED ORDER — MORPHINE SULFATE 4 MG/ML IJ SOLN
6.0000 mg | Freq: Once | INTRAMUSCULAR | Status: AC
  Administered 2014-03-08: 6 mg via INTRAVENOUS
  Filled 2014-03-08: qty 2

## 2014-03-08 NOTE — ED Provider Notes (Signed)
CSN: [id number]     Arrival date & time 03/08/14  1527 History   First MD Initiated Contact with Patient 03/08/14 1608     Chief Complaint  Patient presents with  . Back Pain     (Consider location/radiation/quality/duration/timing/severity/associated sxs/prior Treatment) Patient is a 58 y.o. female presenting with back pain. The history is provided by the patient. No language interpreter was used.  Back Pain Pain location: R flank radiating to RUQ. Quality:  Stabbing Radiates to: RUQ. Pain severity:  Severe Pain is:  Same all the time Onset quality:  Gradual Duration:  2 weeks Timing:  Constant Progression:  Worsening Chronicity:  New Relieved by:  Nothing Worsened by:  Coughing, movement and palpation Associated symptoms: abdominal pain and abdominal swelling   Associated symptoms: no bladder incontinence, no bowel incontinence, no chest pain, no dysuria, no fever, no headaches, no numbness, no pelvic pain and no weakness   Abdominal pain:    Location:  RUQ   Quality:  Stabbing   Severity:  Severe   Onset quality:  Gradual   Duration:  2 weeks   Timing:  Constant   Progression:  Worsening   Chronicity:  New Risk factors: obesity     Past Medical History  Diagnosis Date  . Asthma   . Hypertension   . Degenerative disc disease   . DDD (degenerative disc disease), lumbar   . Chronic back pain   . Reflux   . Diabetes mellitus    Past Surgical History  Procedure Laterality Date  . Cesarean section     History reviewed. No pertinent family history. History  Substance Use Topics  . Smoking status: Current Every Day Smoker -- 0.50 packs/day  . Smokeless tobacco: Never Used  . Alcohol Use: No   OB History   Grav Para Term Preterm Abortions TAB SAB Ect Mult Living                 Review of Systems  Constitutional: Negative for fever, chills, diaphoresis, activity change, appetite change and fatigue.  HENT: Negative for congestion, facial swelling, rhinorrhea  and sore throat.   Eyes: Negative for photophobia and discharge.  Respiratory: Negative for cough, chest tightness and shortness of breath.   Cardiovascular: Negative for chest pain, palpitations and leg swelling.  Gastrointestinal: Positive for abdominal pain. Negative for nausea, vomiting, diarrhea and bowel incontinence.  Endocrine: Negative for polydipsia and polyuria.  Genitourinary: Negative for bladder incontinence, dysuria, frequency, difficulty urinating and pelvic pain.  Musculoskeletal: Positive for back pain. Negative for arthralgias, neck pain and neck stiffness.  Skin: Negative for color change and wound.  Allergic/Immunologic: Negative for immunocompromised state.  Neurological: Negative for facial asymmetry, weakness, numbness and headaches.  Hematological: Does not bruise/bleed easily.  Psychiatric/Behavioral: Negative for confusion and agitation.      Allergies  Erythromycin; Iodine; and Shellfish allergy  Home Medications   Prior to Admission medications   Medication Sig Start Date End Date Taking? Authorizing Provider  albuterol (PROVENTIL HFA;VENTOLIN HFA) 108 (90 BASE) MCG/ACT inhaler Inhale 2 puffs into the lungs every 6 (six) hours as needed. For shortness of breath    Historical Provider, MD  alprazolam Duanne Moron) 2 MG tablet Take 2 mg by mouth 3 (three) times daily.    Historical Provider, MD  amLODipine (NORVASC) 10 MG tablet Take 10 mg by mouth daily.    Historical Provider, MD  fluticasone (FLOVENT DISKUS) 50 MCG/BLIST diskus inhaler Inhale 1 puff into the lungs 2 (two) times daily.  Historical Provider, MD  gabapentin (NEURONTIN) 300 MG capsule Take 300 mg by mouth at bedtime.    Historical Provider, MD  oxyCODONE (OXYCONTIN) 40 MG 12 hr tablet Take 40 mg by mouth 3 (three) times daily.    Historical Provider, MD  oxyCODONE-acetaminophen (PERCOCET) 10-325 MG per tablet Take 1 tablet by mouth every 4 (four) hours as needed (pain).     Historical Provider, MD     BP 158/100  Pulse 98  Temp(Src) 98.6 F (37 C) (Oral)  Resp 18  Ht _0  (1.753 m)  Wt 277 lb (125.646 kg)  BMI 40.89 kg/m2  SpO2 99% Physical Exam  Constitutional: She is oriented to person, place, and time. She appears well-developed and well-nourished. No distress.  HENT:  Head: Normocephalic and atraumatic.  Mouth/Throat: No oropharyngeal exudate.  Eyes: Pupils are equal, round, and reactive to light.  Neck: Normal range of motion. Neck supple.  Cardiovascular: Normal rate, regular rhythm and normal heart sounds.  Exam reveals no gallop and no friction rub.   No murmur heard. Pulmonary/Chest: Effort normal and breath sounds normal. No respiratory distress. She has no wheezes. She has no rales.  Abdominal: Soft. Bowel sounds are normal. She exhibits no distension and no mass. There is tenderness in the right upper quadrant. There is positive Murphy's sign. There is no rebound and no guarding.  Musculoskeletal: Normal range of motion. She exhibits no edema and no tenderness.       Back:  Neurological: She is alert and oriented to person, place, and time.  Skin: Skin is warm and dry.  Psychiatric: She has a normal mood and affect.    ED Course  Procedures (including critical care time) Labs Review Labs Reviewed  CBC WITH DIFFERENTIAL - Abnormal; Notable for the following:    RDW 15.8 (*)    Platelets 420 (*)    All other components within normal limits  COMPREHENSIVE METABOLIC PANEL - Abnormal; Notable for the following:    Glucose, Bld 134 (*)    Total Protein 8.5 (*)    Alkaline Phosphatase 159 (*)    All other components within normal limits  CBG MONITORING, ED - Abnormal; Notable for the following:    Glucose-Capillary 148 (*)    All other components within normal limits  URINALYSIS, ROUTINE W REFLEX MICROSCOPIC  LIPASE, BLOOD  I-STAT CG4 LACTIC ACID, ED    Imaging Review US Abdomen Complete  03/08/2014   CLINICAL DATA:  Two week history of progressive  right upper quadrant and back pain  EXAM: ULTRASOUND ABDOMEN COMPLETE  COMPARISON:  None.  FINDINGS: Gallbladder:  Small echogenic foci air noted layering within the gallbladder lumen consistent with sludge and small stones. Negative for pericholecystic fluid or gallbladder wall thickening. Per the sonographer, the sonographic Percell Miller sign is negative.  Common bile duct:  Diameter: 4.4 mm which is within normal limits.  Liver:  Diffusely echogenic hepatic parenchyma with coarsening of the echotexture. Findings are nonspecific but most suggestive of hepatic steatosis. No discrete lesion identified. The main portal vein is patent with normal hepatopetal flow.  IVC:  No abnormality visualized.  Pancreas:  Echogenic pancreatic parenchyma.  Spleen:  Size and appearance within normal limits.  Right Kidney:  Length: 11.7 cm. Echogenicity within normal limits. No mass or hydronephrosis visualized.  Left Kidney:  Length: 11.9 cm. Echogenicity within normal limits. No mass or hydronephrosis visualized.  Abdominal aorta:  Largely obscured by overlying bowel gas.  Other findings:  Technically challenging examination secondary  to patient body habitus.  IMPRESSION: 1. Gallbladder sludge and small stones without additional sonographic findings to suggest acute cholecystitis. 2. Nonspecific increased echogenicity of the pancreatic parenchyma. This may simply represent fatty infiltration. 3. Hepatic steatosis.   Electronically Signed   By: Jacqulynn Cadet M.D.   On: 03/08/2014 19:38     EKG Interpretation None      MDM   Final diagnoses:  Cholecystitis    Pt is a 58 y.o. female with Pmhx as above who presents with about 2 weeks of pain from R back radiating around to RUQ, constant though waxing/waning. Pain worse w/ mvmt, palpation, unchanged w/ PO intake though reports she hasn't been able to eat anything for about 2 days. At beginning of illness she had a pruritic back rash which has resolved. On PE, Pt  uncomfortable, mildly tachycardic, afebrile.  +Murphy's sign.  US shows stones & sludge, no pericholecystic fluid. WBC nml, AST/ALT nml, alk phos elevated though has been in the past. Give PE and [place] w/ abnl GB findings, i have clinical concern for cholecystitis. Gen surgery consulted, pt will be transferred to [facility] ED to be evaluated by Dr. Johney Maine w/ general surgery who I spoke w/ on the phone. Dr. Regenia Skeeter will accept pt in transfer.          Neta Ehlers, MD 03/09/14 [phone]
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
## 2014-03-08 NOTE — ED Notes (Signed)
Report given to [facility]  

## 2014-03-08 NOTE — ED Notes (Signed)
Pt reports that she had a rash, swelling on the (R) side of her back that has now radiated around into her (R) abdomen.

## 2014-03-08 NOTE — ED Notes (Signed)
Pt states she is anxious and needs some Xanax

## 2014-03-09 ENCOUNTER — Emergency Department (HOSPITAL_COMMUNITY): Payer: Medicaid Other

## 2014-03-09 ENCOUNTER — Encounter (HOSPITAL_COMMUNITY): Payer: Self-pay | Admitting: Surgery

## 2014-03-09 DIAGNOSIS — K7581 Nonalcoholic steatohepatitis (NASH): Secondary | ICD-10-CM | POA: Diagnosis present

## 2014-03-09 DIAGNOSIS — G8929 Other chronic pain: Secondary | ICD-10-CM

## 2014-03-09 DIAGNOSIS — R109 Unspecified abdominal pain: Secondary | ICD-10-CM

## 2014-03-09 DIAGNOSIS — D249 Benign neoplasm of unspecified breast: Secondary | ICD-10-CM

## 2014-03-09 DIAGNOSIS — G4736 Sleep related hypoventilation in conditions classified elsewhere: Secondary | ICD-10-CM

## 2014-03-09 DIAGNOSIS — E119 Type 2 diabetes mellitus without complications: Secondary | ICD-10-CM

## 2014-03-09 DIAGNOSIS — M549 Dorsalgia, unspecified: Secondary | ICD-10-CM

## 2014-03-09 DIAGNOSIS — Z6835 Body mass index (BMI) 35.0-35.9, adult: Secondary | ICD-10-CM | POA: Insufficient documentation

## 2014-03-09 DIAGNOSIS — R0789 Other chest pain: Secondary | ICD-10-CM | POA: Diagnosis present

## 2014-03-09 DIAGNOSIS — I10 Essential (primary) hypertension: Secondary | ICD-10-CM

## 2014-03-09 DIAGNOSIS — M533 Sacrococcygeal disorders, not elsewhere classified: Secondary | ICD-10-CM

## 2014-03-09 DIAGNOSIS — R079 Chest pain, unspecified: Secondary | ICD-10-CM

## 2014-03-09 DIAGNOSIS — Z720 Tobacco use: Secondary | ICD-10-CM | POA: Diagnosis present

## 2014-03-09 DIAGNOSIS — E669 Obesity, unspecified: Secondary | ICD-10-CM

## 2014-03-09 DIAGNOSIS — K828 Other specified diseases of gallbladder: Secondary | ICD-10-CM | POA: Diagnosis present

## 2014-03-09 DIAGNOSIS — R32 Unspecified urinary incontinence: Secondary | ICD-10-CM | POA: Diagnosis present

## 2014-03-09 LAB — I-STAT TROPONIN, ED: TROPONIN I, POC: 0.01 ng/mL (ref 0.00–0.08)

## 2014-03-09 MED ORDER — ONDANSETRON HCL 4 MG/2ML IJ SOLN
4.0000 mg | Freq: Once | INTRAMUSCULAR | Status: AC
  Administered 2014-03-09: 4 mg via INTRAVENOUS
  Filled 2014-03-09: qty 2

## 2014-03-09 MED ORDER — HYDROMORPHONE HCL PF 1 MG/ML IJ SOLN
1.0000 mg | Freq: Once | INTRAMUSCULAR | Status: AC
  Administered 2014-03-09: 1 mg via INTRAVENOUS
  Filled 2014-03-09: qty 1

## 2014-03-09 MED ORDER — LIDOCAINE 5 % EX PTCH: 1.0000 | MEDICATED_PATCH | CUTANEOUS | Status: DC

## 2014-03-09 MED ORDER — SODIUM CHLORIDE 0.9 % IV BOLUS (SEPSIS)
1000.0000 mL | Freq: Once | INTRAVENOUS | Status: AC
  Administered 2014-03-09: 1000 mL via INTRAVENOUS

## 2014-03-09 MED ORDER — LIDOCAINE 5 % EX PTCH
1.0000 | MEDICATED_PATCH | CUTANEOUS | Status: DC
  Administered 2014-03-09: 1 via TRANSDERMAL
  Filled 2014-03-09 (×2): qty 1

## 2014-03-09 MED ORDER — ONDANSETRON HCL 4 MG PO TABS: 4.0000 mg | ORAL_TABLET | Freq: Four times a day (QID) | ORAL | Status: DC

## 2014-03-09 MED ORDER — KETOROLAC TROMETHAMINE 30 MG/ML IJ SOLN
30.0000 mg | Freq: Once | INTRAMUSCULAR | Status: AC
  Administered 2014-03-09: 30 mg via INTRAVENOUS
  Filled 2014-03-09: qty 1

## 2014-03-09 MED ORDER — KETOROLAC TROMETHAMINE 10 MG PO TABS: 10.0000 mg | ORAL_TABLET | Freq: Four times a day (QID) | ORAL | Status: DC | PRN

## 2014-03-09 NOTE — ED Notes (Signed)
Dr Gross at bedside

## 2014-03-09 NOTE — ED Notes (Signed)
Bed: NO70 Expected date:  Expected time:  Means of arrival:  Comments: PT from [facility] [place]

## 2014-03-09 NOTE — Discharge Instructions (Signed)
Abdominal Pain, Women °Abdominal (stomach, pelvic, or belly) pain can be caused by many things. It is important to tell your doctor: °· The location of the pain. °· Does it come and go or is it present all the time? °· Are there things that start the pain (eating certain foods, exercise)? °· Are there other symptoms associated with the pain (fever, nausea, vomiting, diarrhea)? °All of this is helpful to know when trying to find the cause of the pain. °CAUSES  °· Stomach: virus or bacteria infection, or ulcer. °· Intestine: appendicitis (inflamed appendix), regional ileitis (Crohn's disease), ulcerative colitis (inflamed colon), irritable bowel syndrome, diverticulitis (inflamed diverticulum of the colon), or cancer of the stomach or intestine. °· Gallbladder disease or stones in the gallbladder. °· Kidney disease, kidney stones, or infection. °· Pancreas infection or cancer. °· Fibromyalgia (pain disorder). °· Diseases of the female organs: °· Uterus: fibroid (non-cancerous) tumors or infection. °· Fallopian tubes: infection or tubal pregnancy. °· Ovary: cysts or tumors. °· Pelvic adhesions (scar tissue). °· Endometriosis (uterus lining tissue growing in the pelvis and on the pelvic organs). °· Pelvic congestion syndrome (female organs filling up with blood just before the menstrual period). °· Pain with the menstrual period. °· Pain with ovulation (producing an egg). °· Pain with an IUD (intrauterine device, birth control) in the uterus. °· Cancer of the female organs. °· Functional pain (pain not caused by a disease, may improve without treatment). °· Psychological pain. °· Depression. °DIAGNOSIS  °Your doctor will decide the seriousness of your pain by doing an examination. °· Blood tests. °· X-rays. °· Ultrasound. °· CT scan (computed tomography, special type of X-ray). °· MRI (magnetic resonance imaging). °· Cultures, for infection. °· Barium enema (dye inserted in the large intestine, to better view it with  X-rays). °· Colonoscopy (looking in intestine with a lighted tube). °· Laparoscopy (minor surgery, looking in abdomen with a lighted tube). °· Major abdominal exploratory surgery (looking in abdomen with a large incision). °TREATMENT  °The treatment will depend on the cause of the pain.  °· Many cases can be observed and treated at home. °· Over-the-counter medicines recommended by your caregiver. °· Prescription medicine. °· Antibiotics, for infection. °· Birth control pills, for painful periods or for ovulation pain. °· Hormone treatment, for endometriosis. °· Nerve blocking injections. °· Physical therapy. °· Antidepressants. °· Counseling with a psychologist or psychiatrist. °· Minor or major surgery. °HOME CARE INSTRUCTIONS  °· Do not take laxatives, unless directed by your caregiver. °· Take over-the-counter pain medicine only if ordered by your caregiver. Do not take aspirin because it can cause an upset stomach or bleeding. °· Try a clear liquid diet (broth or water) as ordered by your caregiver. Slowly move to a bland diet, as tolerated, if the pain is related to the stomach or intestine. °· Have a thermometer and take your temperature several times a day, and record it. °· Bed rest and sleep, if it helps the pain. °· Avoid sexual intercourse, if it causes pain. °· Avoid stressful situations. °· Keep your follow-up appointments and tests, as your caregiver orders. °· If the pain does not go away with medicine or surgery, you may try: °· Acupuncture. °· Relaxation exercises (yoga, meditation). °· Group therapy. °· Counseling. °SEEK MEDICAL CARE IF:  °· You notice certain foods cause stomach pain. °· Your home care treatment is not helping your pain. °· You need stronger pain medicine. °· You want your IUD removed. °· You feel faint or   lightheaded.  You develop nausea and vomiting.  You develop a rash.  You are having side effects or an allergy to your medicine. SEEK IMMEDIATE MEDICAL CARE IF:   Your  pain does not go away or gets worse.  You have a fever.  Your pain is felt only in portions of the abdomen. The right side could possibly be appendicitis. The left lower portion of the abdomen could be colitis or diverticulitis.  You are passing blood in your stools (bright red or black tarry stools, with or without vomiting).  You have blood in your urine.  You develop chills, with or without a fever.  You pass out. MAKE SURE YOU:   Understand these instructions.  Will watch your condition.  Will get help right away if you are not doing well or get worse. Document Released: 09/08/2007 Document Revised: 02/03/2012 Document Reviewed: 09/28/2009 [facility] Patient Information 2014 [place], [place].  Cholelithiasis Cholelithiasis (also called gallstones) is a form of gallbladder disease in which gallstones form in your gallbladder. The gallbladder is an organ that stores bile made in the liver, which helps digest fats. Gallstones begin as small crystals and slowly grow into stones. Gallstone pain occurs when the gallbladder spasms and a gallstone is blocking the duct. Pain can also occur when a stone passes out of the duct.  RISK FACTORS  Being female.   Having multiple pregnancies. Health care providers sometimes advise removing diseased gallbladders before future pregnancies.   Being obese.  Eating a diet heavy in fried foods and fat.   Being older than 34 years and increasing age.   Prolonged use of medicines containing female hormones.   Having diabetes mellitus.   Rapidly losing weight.   Having a family history of gallstones (heredity).  SYMPTOMS  Nausea.   Vomiting.  Abdominal pain.   Yellowing of the skin (jaundice).   Sudden pain. It may persist from several minutes to several hours.  Fever.   Tenderness to the touch. In some cases, when gallstones do not move into the bile duct, people have no pain or symptoms. These are called "silent"  gallstones.  TREATMENT Silent gallstones do not need treatment. In severe cases, emergency surgery may be required. Options for treatment include:  Surgery to remove the gallbladder. This is the most common treatment.  Medicines. These do not always work and may take 6 12 months or more to work.  Shock wave treatment (extracorporeal biliary lithotripsy). In this treatment an ultrasound machine sends shock waves to the gallbladder to break gallstones into smaller pieces that can pass into the intestines or be dissolved by medicine. HOME CARE INSTRUCTIONS   Only take over-the-counter or prescription medicines for pain, discomfort, or fever as directed by your health care provider.   Follow a low-fat diet until seen again by your health care provider. Fat causes the gallbladder to contract, which can result in pain.   Follow up with your health care provider as directed. Attacks are almost always recurrent and surgery is usually required for permanent treatment.  SEEK IMMEDIATE MEDICAL CARE IF:   Your pain increases and is not controlled by medicines.   You have a fever or persistent symptoms for more than 2 3 days.   You have a fever and your symptoms suddenly get worse.   You have persistent nausea and vomiting.  MAKE SURE YOU:   Understand these instructions.  Will watch your condition.  Will get help right away if you are not doing well or  get worse. Document Released: 11/07/2005 Document Revised: 07/14/2013 Document Reviewed: 05/05/2013 [facility] Patient Information 2014 [place].  Flank Pain Flank pain refers to pain that is located on the side of the body between the upper abdomen and the back. The pain may occur over a short period of time (acute) or may be long-term or reoccurring (chronic). It may be mild or severe. Flank pain can be caused by many things. CAUSES  Some of the more common causes of flank pain include:  Muscle strains.   Muscle spasms.    A disease of your spine (vertebral disk disease).   A lung infection (pneumonia).   Fluid around your lungs (pulmonary edema).   A kidney infection.   Kidney stones.   A very painful skin rash caused by the chickenpox virus (shingles).   Gallbladder disease.  [place] care will depend on the cause of your pain. In general,  Rest as directed by your caregiver.  Drink enough fluids to keep your urine clear or pale yellow.  Only take over-the-counter or prescription medicines as directed by your caregiver. Some medicines may help relieve the pain.  Tell your caregiver about any changes in your pain.  Follow up with your caregiver as directed. SEEK IMMEDIATE MEDICAL CARE IF:   Your pain is not controlled with medicine.   You have new or worsening symptoms.  Your pain increases.   You have abdominal pain.   You have shortness of breath.   You have persistent nausea or vomiting.   You have swelling in your abdomen.   You feel faint or pass out.   You have blood in your urine.  You have a fever or persistent symptoms for more than 2 3 days.  You have a fever and your symptoms suddenly get worse. MAKE SURE YOU:   Understand these instructions.  Will watch your condition.  Will get help right away if you are not doing well or get worse. Document Released: 01/02/2006 Document Revised: 08/05/2012 Document Reviewed: 06/25/2012 [facility] Patient Information 2014 [place]. Diet for Gastroesophageal Reflux Disease, Adult Reflux (acid reflux) is when acid from your stomach flows up into the esophagus. When acid comes in contact with the esophagus, the acid causes irritation and soreness (inflammation) in the esophagus. When reflux happens often or so severely that it causes damage to the esophagus, it is called gastroesophageal reflux disease (GERD). Nutrition therapy can help ease the discomfort of GERD. FOODS OR DRINKS TO AVOID  OR LIMIT  Smoking or chewing tobacco. Nicotine is one of the most potent stimulants to acid production in the gastrointestinal tract.  Caffeinated and decaffeinated coffee and black tea.  Regular or low-calorie carbonated beverages or energy drinks (caffeine-free carbonated beverages are allowed).   Strong spices, such as black pepper, white pepper, red pepper, cayenne, curry powder, and chili powder.  Peppermint or spearmint.  Chocolate.  High-fat foods, including meats and fried foods. Extra added fats including oils, butter, salad dressings, and nuts. Limit these to less than 8 tsp per day.  Fruits and vegetables if they are not tolerated, such as citrus fruits or tomatoes.  Alcohol.  Any food that seems to aggravate your condition. If you have questions regarding your diet, call your caregiver or a registered dietitian. OTHER THINGS THAT MAY HELP GERD INCLUDE:   Eating your meals slowly, in a relaxed setting.  Eating 5 to 6 small meals per day instead of 3 large meals.  Eliminating food for a  period of time if it causes distress.  Not lying down until 3 hours after eating a meal.  Keeping the head of your bed raised 6 to 9 inches (15 to 23 cm) by using a foam wedge or blocks under the legs of the bed. Lying flat may make symptoms worse.  Being physically active. Weight loss may be helpful in reducing reflux in overweight or obese adults.  Wear loose fitting clothing EXAMPLE MEAL PLAN This meal plan is approximately 2,000 calories based on [email or web address] meal planning guidelines. Breakfast   cup cooked oatmeal.  1 cup strawberries.  1 cup low-fat milk.  1 oz almonds. Snack  1 cup cucumber slices.  6 oz yogurt (made from low-fat or fat-free milk). Lunch  2 slice whole-wheat bread.  2 oz sliced [place].  2 tsp mayonnaise.  1 cup blueberries.  1 cup snap peas. Snack  6 whole-wheat crackers.  1 oz string cheese. Dinner   cup brown rice.  1  cup mixed veggies.  1 tsp olive oil.  3 oz grilled fish. Document Released: 11/11/2005 Document Revised: 02/03/2012 Document Reviewed: 09/27/2011 [facility] Patient Information 2014 [place], [place].

## 2014-03-09 NOTE — ED Notes (Addendum)
Pt requesting more pain medication or something to sleep.  Informed patient that we could not do that and that she has already had 3mg  dilaudid and 6 mg morphine and that we need to get the scan done to find out what is wrong.  Pt and family member on phone. MD informed of patients complaints.
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
## 2014-03-09 NOTE — ED Notes (Signed)
Patient medicated for pain, see MAR Patient states that she wants "Benadryl to help me sleep, because I haven't been to sleep and the pain medications aren't helping." Will make EDP aware

## 2014-03-09 NOTE — ED Notes (Signed)
Report given to [facility] representative at [facility]

## 2014-07-18 ENCOUNTER — Observation Stay (HOSPITAL_COMMUNITY)
Admission: EM | Admit: 2014-07-18 | Discharge: 2014-07-21 | Disposition: A | Discharge status: Home / Self Care | Payer: Medicaid Other | Attending: Internal Medicine | Admitting: Internal Medicine

## 2014-07-18 ENCOUNTER — Emergency Department (HOSPITAL_COMMUNITY): Payer: Medicaid Other

## 2014-07-18 ENCOUNTER — Encounter (HOSPITAL_COMMUNITY): Payer: Self-pay | Admitting: Emergency Medicine

## 2014-07-18 DIAGNOSIS — Z79899 Other long term (current) drug therapy: Secondary | ICD-10-CM | POA: Insufficient documentation

## 2014-07-18 DIAGNOSIS — G8929 Other chronic pain: Secondary | ICD-10-CM | POA: Diagnosis not present

## 2014-07-18 DIAGNOSIS — F17200 Nicotine dependence, unspecified, uncomplicated: Secondary | ICD-10-CM | POA: Diagnosis not present

## 2014-07-18 DIAGNOSIS — R5381 Other malaise: Secondary | ICD-10-CM | POA: Insufficient documentation

## 2014-07-18 DIAGNOSIS — M2550 Pain in unspecified joint: Secondary | ICD-10-CM | POA: Insufficient documentation

## 2014-07-18 DIAGNOSIS — R109 Unspecified abdominal pain: Secondary | ICD-10-CM | POA: Diagnosis not present

## 2014-07-18 DIAGNOSIS — Z8739 Personal history of other diseases of the musculoskeletal system and connective tissue: Secondary | ICD-10-CM | POA: Diagnosis not present

## 2014-07-18 DIAGNOSIS — R42 Dizziness and giddiness: Secondary | ICD-10-CM | POA: Diagnosis not present

## 2014-07-18 DIAGNOSIS — K219 Gastro-esophageal reflux disease without esophagitis: Secondary | ICD-10-CM | POA: Diagnosis not present

## 2014-07-18 DIAGNOSIS — Z720 Tobacco use: Secondary | ICD-10-CM | POA: Diagnosis present

## 2014-07-18 DIAGNOSIS — R5383 Other fatigue: Secondary | ICD-10-CM

## 2014-07-18 DIAGNOSIS — F411 Generalized anxiety disorder: Secondary | ICD-10-CM | POA: Insufficient documentation

## 2014-07-18 DIAGNOSIS — Z9049 Acquired absence of other specified parts of digestive tract: Secondary | ICD-10-CM

## 2014-07-18 DIAGNOSIS — R112 Nausea with vomiting, unspecified: Secondary | ICD-10-CM | POA: Diagnosis not present

## 2014-07-18 DIAGNOSIS — R55 Syncope and collapse: Secondary | ICD-10-CM | POA: Diagnosis present

## 2014-07-18 DIAGNOSIS — J45909 Unspecified asthma, uncomplicated: Secondary | ICD-10-CM | POA: Insufficient documentation

## 2014-07-18 DIAGNOSIS — G9340 Encephalopathy, unspecified: Secondary | ICD-10-CM | POA: Diagnosis present

## 2014-07-18 DIAGNOSIS — E119 Type 2 diabetes mellitus without complications: Secondary | ICD-10-CM | POA: Diagnosis not present

## 2014-07-18 DIAGNOSIS — Z9089 Acquired absence of other organs: Secondary | ICD-10-CM | POA: Insufficient documentation

## 2014-07-18 DIAGNOSIS — F419 Anxiety disorder, unspecified: Secondary | ICD-10-CM

## 2014-07-18 DIAGNOSIS — I10 Essential (primary) hypertension: Secondary | ICD-10-CM | POA: Diagnosis present

## 2014-07-18 DIAGNOSIS — R4182 Altered mental status, unspecified: Secondary | ICD-10-CM | POA: Diagnosis not present

## 2014-07-18 DIAGNOSIS — M549 Dorsalgia, unspecified: Secondary | ICD-10-CM

## 2014-07-18 DIAGNOSIS — R9431 Abnormal electrocardiogram [ECG] [EKG]: Secondary | ICD-10-CM | POA: Diagnosis present

## 2014-07-18 LAB — CBC WITH DIFFERENTIAL/PLATELET
BASOS ABS: 0 10*3/uL (ref 0.0–0.1)
BASOS PCT: 0 % (ref 0–1)
Eosinophils Absolute: 0.1 10*3/uL (ref 0.0–0.7)
Eosinophils Relative: 1 % (ref 0–5)
HCT: 42 % (ref 36.0–46.0)
Hemoglobin: 13.1 g/dL (ref 12.0–15.0)
LYMPHS ABS: 2.3 10*3/uL (ref 0.7–4.0)
Lymphocytes Relative: 31 % (ref 12–46)
MCH: 25.7 pg — ABNORMAL LOW (ref 26.0–34.0)
MCHC: 31.2 g/dL (ref 30.0–36.0)
MCV: 82.5 fL (ref 78.0–100.0)
Monocytes Absolute: 0.6 10*3/uL (ref 0.1–1.0)
Monocytes Relative: 8 % (ref 3–12)
NEUTROS PCT: 60 % (ref 43–77)
Neutro Abs: 4.5 10*3/uL (ref 1.7–7.7)
PLATELETS: 280 10*3/uL (ref 150–400)
RBC: 5.09 MIL/uL (ref 3.87–5.11)
RDW: 14.8 % (ref 11.5–15.5)
WBC: 7.4 10*3/uL (ref 4.0–10.5)

## 2014-07-18 LAB — COMPREHENSIVE METABOLIC PANEL
ALBUMIN: 3.4 g/dL — AB (ref 3.5–5.2)
ALK PHOS: 137 U/L — AB (ref 39–117)
ALT: 9 U/L (ref 0–35)
ANION GAP: 14 (ref 5–15)
AST: 12 U/L (ref 0–37)
BUN: 11 mg/dL (ref 6–23)
CALCIUM: 8.9 mg/dL (ref 8.4–10.5)
CO2: 24 mEq/L (ref 19–32)
Chloride: 102 mEq/L (ref 96–112)
Creatinine, Ser: 0.88 mg/dL (ref 0.50–1.10)
GFR calc Af Amer: 83 mL/min — ABNORMAL LOW (ref >90)
GFR calc non Af Amer: 72 mL/min — ABNORMAL LOW (ref >90)
GLUCOSE: 145 mg/dL — AB (ref 70–99)
POTASSIUM: 3.9 meq/L (ref 3.7–5.3)
SODIUM: 140 meq/L (ref 137–147)
TOTAL PROTEIN: 7.5 g/dL (ref 6.0–8.3)
Total Bilirubin: <0.2 mg/dL — ABNORMAL LOW (ref 0.3–1.2)

## 2014-07-18 LAB — PROTIME-INR
INR: 0.98 (ref 0.00–1.49)
Prothrombin Time: 13 seconds (ref 11.6–15.2)

## 2014-07-18 LAB — TROPONIN I: Troponin I: <0.3 ng/mL (ref <0.30)

## 2014-07-18 MED ORDER — MORPHINE SULFATE 4 MG/ML IJ SOLN
4.0000 mg | Freq: Once | INTRAMUSCULAR | Status: AC
  Administered 2014-07-18: 4 mg via INTRAVENOUS
  Filled 2014-07-18: qty 1

## 2014-07-18 MED ORDER — SODIUM CHLORIDE 0.9 % IV BOLUS (SEPSIS)
1000.0000 mL | Freq: Once | INTRAVENOUS | Status: AC
  Administered 2014-07-18: 1000 mL via INTRAVENOUS

## 2014-07-18 MED ORDER — ONDANSETRON HCL 4 MG/2ML IJ SOLN
4.0000 mg | Freq: Once | INTRAMUSCULAR | Status: AC
  Administered 2014-07-18: 4 mg via INTRAVENOUS
  Filled 2014-07-18: qty 2

## 2014-07-18 NOTE — ED Notes (Signed)
Pt finished with contrast. CT notified. 

## 2014-07-18 NOTE — ED Notes (Signed)
Per [facility], pt from home. Pt had near syncopal episode per family at home. Pt just finished using restroom and went to bedroom, pt was very lethargic. Pt has generlized weakness and lethargy. Pt is AAOX4. SPO2 88% on RA. 96% on 2L. 105/70.

## 2014-07-18 NOTE — ED Notes (Signed)
12 lead by ems unremarkable.
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
## 2014-07-19 ENCOUNTER — Encounter (HOSPITAL_COMMUNITY): Payer: Self-pay | Admitting: General Practice

## 2014-07-19 ENCOUNTER — Observation Stay (HOSPITAL_COMMUNITY): Payer: Medicaid Other

## 2014-07-19 DIAGNOSIS — I517 Cardiomegaly: Secondary | ICD-10-CM

## 2014-07-19 DIAGNOSIS — G9340 Encephalopathy, unspecified: Secondary | ICD-10-CM | POA: Diagnosis present

## 2014-07-19 DIAGNOSIS — R109 Unspecified abdominal pain: Secondary | ICD-10-CM

## 2014-07-19 DIAGNOSIS — Z9049 Acquired absence of other specified parts of digestive tract: Secondary | ICD-10-CM

## 2014-07-19 DIAGNOSIS — R55 Syncope and collapse: Secondary | ICD-10-CM | POA: Diagnosis present

## 2014-07-19 DIAGNOSIS — G8929 Other chronic pain: Secondary | ICD-10-CM

## 2014-07-19 DIAGNOSIS — F411 Generalized anxiety disorder: Secondary | ICD-10-CM

## 2014-07-19 DIAGNOSIS — R9431 Abnormal electrocardiogram [ECG] [EKG]: Secondary | ICD-10-CM | POA: Diagnosis present

## 2014-07-19 DIAGNOSIS — M549 Dorsalgia, unspecified: Secondary | ICD-10-CM

## 2014-07-19 LAB — RAPID URINE DRUG SCREEN, HOSP PERFORMED
Amphetamines: NOT DETECTED
Barbiturates: NOT DETECTED
Benzodiazepines: POSITIVE — AB
Cocaine: NOT DETECTED
Opiates: POSITIVE — AB
Tetrahydrocannabinol: NOT DETECTED

## 2014-07-19 LAB — HEMOGLOBIN A1C
Hgb A1c MFr Bld: 7 % — ABNORMAL HIGH (ref <5.7)
Mean Plasma Glucose: 154 mg/dL — ABNORMAL HIGH (ref <117)

## 2014-07-19 LAB — GLUCOSE, CAPILLARY
Glucose-Capillary: 125 mg/dL — ABNORMAL HIGH (ref 70–99)
Glucose-Capillary: 115 mg/dL — ABNORMAL HIGH (ref 70–99)
Glucose-Capillary: 118 mg/dL — ABNORMAL HIGH (ref 70–99)
Glucose-Capillary: 149 mg/dL — ABNORMAL HIGH (ref 70–99)
Glucose-Capillary: 141 mg/dL — ABNORMAL HIGH (ref 70–99)

## 2014-07-19 LAB — URINALYSIS, ROUTINE W REFLEX MICROSCOPIC
Bilirubin Urine: NEGATIVE
Glucose, UA: NEGATIVE mg/dL
Hgb urine dipstick: NEGATIVE
Ketones, ur: NEGATIVE mg/dL
Leukocytes, UA: NEGATIVE
Nitrite: NEGATIVE
Protein, ur: NEGATIVE mg/dL
Specific Gravity, Urine: 1.012 (ref 1.005–1.030)
Urobilinogen, UA: 0.2 mg/dL (ref 0.0–1.0)
pH: 5.5 (ref 5.0–8.0)

## 2014-07-19 LAB — LIPID PANEL
Cholesterol: 107 mg/dL (ref 0–200)
HDL: 41 mg/dL (ref >39)
LDL CALC: 46 mg/dL (ref 0–99)
Total CHOL/HDL Ratio: 2.6 RATIO
Triglycerides: 98 mg/dL (ref <150)
VLDL: 20 mg/dL (ref 0–40)

## 2014-07-19 LAB — VITAMIN B12: Vitamin B-12: 519 pg/mL (ref 211–911)

## 2014-07-19 LAB — TROPONIN I

## 2014-07-19 LAB — AMMONIA: Ammonia: 23 umol/L (ref 11–60)

## 2014-07-19 LAB — FOLATE: Folate: 14.6 ng/mL

## 2014-07-19 LAB — TSH: TSH: 0.373 u[IU]/mL (ref 0.350–4.500)

## 2014-07-19 MED ORDER — OXYCODONE HCL 5 MG PO TABS: 5.0000 mg | ORAL_TABLET | Freq: Three times a day (TID) | ORAL | Status: DC | PRN

## 2014-07-19 MED ORDER — METHOCARBAMOL 500 MG PO TABS
500.0000 mg | ORAL_TABLET | Freq: Three times a day (TID) | ORAL | Status: DC | PRN
  Administered 2014-07-19: 500 mg via ORAL
  Filled 2014-07-19 (×2): qty 1

## 2014-07-19 MED ORDER — PANTOPRAZOLE SODIUM 40 MG IV SOLR: 40.0000 mg | Freq: Once | INTRAVENOUS | Status: AC

## 2014-07-19 MED ORDER — ONDANSETRON 8 MG/NS 50 ML IVPB
8.0000 mg | Freq: Three times a day (TID) | INTRAVENOUS | Status: DC | PRN
  Administered 2014-07-20: 8 mg via INTRAVENOUS
  Filled 2014-07-19: qty 8

## 2014-07-19 MED ORDER — ENOXAPARIN SODIUM 40 MG/0.4ML ~~LOC~~ SOLN
40.0000 mg | SUBCUTANEOUS | Status: DC
  Administered 2014-07-20 – 2014-07-21 (×2): 40 mg via SUBCUTANEOUS
  Filled 2014-07-19 (×4): qty 0.4

## 2014-07-19 MED ORDER — ACETAMINOPHEN 325 MG PO TABS: 650.0000 mg | ORAL_TABLET | ORAL | Status: DC | PRN

## 2014-07-19 MED ORDER — PANTOPRAZOLE SODIUM 40 MG PO TBEC
40.0000 mg | DELAYED_RELEASE_TABLET | Freq: Two times a day (BID) | ORAL | Status: DC
Start: 2014-07-19 — End: 2014-07-21
  Administered 2014-07-19 – 2014-07-21 (×6): 40 mg via ORAL
  Filled 2014-07-19 (×6): qty 1

## 2014-07-19 MED ORDER — ALBUTEROL SULFATE (2.5 MG/3ML) 0.083% IN NEBU: 3.0000 mL | INHALATION_SOLUTION | Freq: Four times a day (QID) | RESPIRATORY_TRACT | Status: DC | PRN

## 2014-07-19 MED ORDER — OXYCODONE HCL 5 MG PO TABS
10.0000 mg | ORAL_TABLET | Freq: Three times a day (TID) | ORAL | Status: DC | PRN
  Administered 2014-07-19 – 2014-07-21 (×3): 10 mg via ORAL
  Filled 2014-07-19 (×3): qty 2

## 2014-07-19 MED ORDER — ATORVASTATIN CALCIUM 10 MG PO TABS
10.0000 mg | ORAL_TABLET | Freq: Every day | ORAL | Status: DC
  Administered 2014-07-20 – 2014-07-21 (×2): 10 mg via ORAL
  Filled 2014-07-19 (×4): qty 1

## 2014-07-19 MED ORDER — INSULIN ASPART 100 UNIT/ML ~~LOC~~ SOLN
0.0000 [IU] | Freq: Three times a day (TID) | SUBCUTANEOUS | Status: DC
  Administered 2014-07-20: 3 [IU] via SUBCUTANEOUS
  Administered 2014-07-20: 2 [IU] via SUBCUTANEOUS

## 2014-07-19 MED ORDER — AMLODIPINE BESYLATE 10 MG PO TABS
10.0000 mg | ORAL_TABLET | Freq: Every day | ORAL | Status: DC
  Administered 2014-07-19 – 2014-07-21 (×3): 10 mg via ORAL
  Filled 2014-07-19 (×4): qty 1

## 2014-07-19 MED ORDER — DOCUSATE SODIUM 100 MG PO CAPS
100.0000 mg | ORAL_CAPSULE | Freq: Every day | ORAL | Status: DC | PRN
  Administered 2014-07-20: 100 mg via ORAL
  Filled 2014-07-19: qty 1

## 2014-07-19 MED ORDER — ALBUTEROL SULFATE HFA 108 (90 BASE) MCG/ACT IN AERS
2.0000 | INHALATION_SPRAY | Freq: Four times a day (QID) | RESPIRATORY_TRACT | Status: DC | PRN
Start: 2014-07-19 — End: 2014-07-19

## 2014-07-19 MED ORDER — GABAPENTIN 300 MG PO CAPS
900.0000 mg | ORAL_CAPSULE | Freq: Three times a day (TID) | ORAL | Status: DC
  Administered 2014-07-19 – 2014-07-21 (×8): 900 mg via ORAL
  Filled 2014-07-19 (×10): qty 3

## 2014-07-19 MED ORDER — OXYCODONE-ACETAMINOPHEN 5-325 MG PO TABS: 1.0000 | ORAL_TABLET | Freq: Three times a day (TID) | ORAL | Status: DC | PRN

## 2014-07-19 MED ORDER — ALPRAZOLAM 0.5 MG PO TABS
2.0000 mg | ORAL_TABLET | Freq: Two times a day (BID) | ORAL | Status: DC | PRN
  Administered 2014-07-19 – 2014-07-20 (×3): 2 mg via ORAL
  Filled 2014-07-19 (×3): qty 4

## 2014-07-19 MED ORDER — OXYCODONE-ACETAMINOPHEN 10-325 MG PO TABS: 1.0000 | ORAL_TABLET | Freq: Three times a day (TID) | ORAL | Status: DC | PRN

## 2014-07-19 MED ORDER — ASPIRIN 325 MG PO TABS
325.0000 mg | ORAL_TABLET | Freq: Every day | ORAL | Status: DC
  Administered 2014-07-19 – 2014-07-21 (×3): 325 mg via ORAL
  Filled 2014-07-19 (×3): qty 1

## 2014-07-19 MED ORDER — OXYCODONE-ACETAMINOPHEN 5-325 MG PO TABS
2.0000 | ORAL_TABLET | Freq: Three times a day (TID) | ORAL | Status: DC | PRN
  Administered 2014-07-19 – 2014-07-21 (×5): 2 via ORAL
  Filled 2014-07-19 (×5): qty 2

## 2014-07-19 MED ORDER — INSULIN ASPART 100 UNIT/ML ~~LOC~~ SOLN: 0.0000 [IU] | Freq: Every day | SUBCUTANEOUS | Status: DC

## 2014-07-19 MED ORDER — OXYCODONE HCL ER 10 MG PO T12A
40.0000 mg | EXTENDED_RELEASE_TABLET | Freq: Three times a day (TID) | ORAL | Status: DC
  Administered 2014-07-19 – 2014-07-21 (×8): 40 mg via ORAL
  Filled 2014-07-19 (×10): qty 4

## 2014-07-19 NOTE — Progress Notes (Signed)
  Echocardiogram 2D Echocardiogram has been performed.  Diamond Nickel 07/19/2014, 9:52 AM

## 2014-07-19 NOTE — ED Notes (Signed)
Report called to floor RN

## 2014-07-19 NOTE — Progress Notes (Addendum)
Nutrition Brief Note  Patient identified on the Malnutrition Screening Tool (MST) Report for weight loss. Weight this admission 265 lb (not yet documented in computer). Weight down 4% over the past 4 months, which is insignificant. Patient reports that she changed the way she was eating since diagnosed with diabetes. She requested information on diabetes diet.   Wt Readings from Last 15 Encounters:  03/08/14 277 lb (125.646 kg)  08/17/12 260 lb (117.935 kg)  08/06/12 288 lb (130.636 kg)  07/24/12 288 lb (130.636 kg)    Ht Readings from Last 2 Encounters:  03/08/14 5\' 9"  (1.753 m)  08/06/12 5' 9.25" (1.759 m)    BMI=39.1  Patient meets criteria for class 2 obesity based on current BMI.   Current diet order is regular. Labs and medications reviewed. Provided "CHO counting with diabetes" handout per patient request.  No nutrition interventions warranted at this time. If nutrition issues arise, please consult RD.   Molli Barrows, RD, LDN, [place] Pager [phone] After Hours Pager [phone]

## 2014-07-19 NOTE — Plan of Care (Signed)
Problem: Acute Rehab PT Goals(only PT should resolve) Goal: Pt Will Go Supine/Side To Sit Verbally to use correct body mechanics Goal: Pt Will Ambulate On level surface

## 2014-07-19 NOTE — Evaluation (Signed)
Physical Therapy Evaluation Patient Details Name: Teresa Wiley MRN: [id number] DOB: November 04, 1956 Today's Date: 07/19/2014   History of Present Illness  Teresa Wiley is a 58 y.o. female with Past medical history of anxiety, chronic back pain, hypertension, asthma, diabetes mellitus, status post cholecystectomy and hernia repair 7/15.   Clinical Impression  Pt presents with prior level of independence and now is extremely lethargic and unable to be mobile without mod to max assist, beyond the safe level of her assistance.  Have plan to get her up in chair and discussed the need to get her walking and on stairs to safely return home with partner.  Both agreed to the plan of care.  Further testing to be done today to determine her source of mental status changes.    Follow Up Recommendations Supervision/Assistance - 24 hour;Home health PT;Other (comment) (Depending on her outcome and mental clarity)    Equipment Recommendations  None recommended by PT;Other (comment) (To be determined)    Recommendations for Other Services Other (comment) (TBD when pt more mentally clear and able to walk)     Precautions / Restrictions Precautions Precautions: Fall Restrictions Weight Bearing Restrictions: No      Mobility  Bed Mobility Overal bed mobility: Needs Assistance Bed Mobility: Rolling;Supine to Sit Rolling: Mod assist   Supine to sit: Mod assist     General bed mobility comments: Asked pt to help initiate and her efforts were non sustained, slow to respond and poor quality of effort  Transfers Overall transfer level: Needs assistance Equipment used: 1 person hand held assist Transfers: Sit to/from Stand Sit to Stand: Max assist         General transfer comment: Pt required recues for multiple attempts, swaying with eyes closed, has fractured index finger on R hand per partner, has not tolerated use.  Assisted with wrist area, dense cues for safety and use of bed rail with  LUE, reminders for upright posture and guarded with pt's R knee supported by PT  Ambulation/Gait                Stairs            Wheelchair Mobility    Modified Rankin (Stroke Patients Only)       Balance Overall balance assessment: Needs assistance Sitting-balance support: Bilateral upper extremity supported;Feet supported Sitting balance-Leahy Scale: Poor Sitting balance - Comments: Leans backward with eyes closed unless pt reminded to keep eyes open by PT.  Had continual contact with her Postural control: Posterior lean Standing balance support: Bilateral upper extremity supported Standing balance-Leahy Scale: Poor Standing balance comment: Reminded her to keep contact with bedrail on L side, prompts for contact with bed and PT with reminders to keep eyes open.                             Pertinent Vitals/Pain Pain Assessment: Faces Faces Pain Scale: Hurts even more Pain Location: abdomen Pain Descriptors / Indicators: Aching Pain Intervention(s): Limited activity within patient's tolerance;Other (comment) (Nursing notified) BP was 111/68, pulse 84 and sat 95% per nursing report.    Home Living Family/patient expects to be discharged to:: Private residence Living Arrangements: Spouse/significant other Available Help at Discharge: Family;Available PRN/intermittently Type of Home: House Home Access: Stairs to enter Entrance Stairs-Rails: Right;Left;Can reach both Entrance Stairs-Number of Steps: 3 Home Layout: Two level Home Equipment: None Additional Comments: Pt apparently was on a RW previously and rehab got to  independent functional level    Prior Function Level of Independence: Independent               Hand Dominance        Extremity/Trunk Assessment   Upper Extremity Assessment: Difficult to assess due to impaired cognition           Lower Extremity Assessment: Difficult to assess due to impaired cognition       Cervical / Trunk Assessment: Normal  Communication   Communication: Expressive difficulties;Other (comment) (Slurred speech and cannot answer all questions)  Cognition Arousal/Alertness: Lethargic Behavior During Therapy: Flat affect Overall Cognitive Status: Impaired/Different from baseline Area of Impairment: Orientation;Attention;Following commands;Safety/judgement;Awareness;Problem solving   Current Attention Level: Divided Memory: Decreased recall of precautions Following Commands: Follows one step commands inconsistently;Follows one step commands with increased time Safety/Judgement: Decreased awareness of safety;Decreased awareness of deficits Awareness: Emergent;Anticipatory Problem Solving: Slow processing;Difficulty sequencing;Requires verbal cues;Requires tactile cues;Decreased initiation General Comments: Lethargic presentation as well as her partner presenting with a great deal of lethargy and repeating questions that have been answered    General Comments General comments (skin integrity, edema, etc.): Pt very sleepy and her partner is as well.  Talked with nsg about the presentation of both as well as the repetition of questions without recall of answers for both    Exercises General Exercises - Lower Extremity Long Arc Quad: AROM;10 reps;Seated Hip Flexion/Marching: 5 reps;Seated;AROM      Assessment/Plan    PT Assessment Patient needs continued PT services  PT Diagnosis Altered mental status;Difficulty walking   PT Problem List Decreased activity tolerance;Decreased strength;Decreased range of motion;Decreased balance;Decreased mobility;Decreased coordination;Decreased cognition;Decreased safety awareness;Obesity;Pain  PT Treatment Interventions Functional mobility training;Therapeutic activities;Therapeutic exercise;Balance training;Neuromuscular re-education;Cognitive remediation;Patient/family education;DME instruction;Gait training;Stair training   PT Goals  (Current goals can be found in the Care Plan section) Acute Rehab PT Goals Patient Stated Goal: Did not state PT Goal Formulation: With patient/family Time For Goal Achievement: 07/26/14 Potential to Achieve Goals: Good    Frequency Min 3X/week   Barriers to discharge Inaccessible home environment;Decreased caregiver support Will need equipment ordered if any found to be needed    Co-evaluation               End of Session   Activity Tolerance: Patient limited by lethargy Patient left: in bed;with call bell/phone within reach;with family/visitor present Nurse Communication: Mobility status;Other (comment) (PT wish for pt to be up in chair if she will agree later)         Time: 2130-8657 PT Time Calculation (min): 28 min   Charges:   PT Evaluation $Initial PT Evaluation Tier I: 1 Procedure PT Treatments $Therapeutic Activity: 8-22 mins   PT G Codes:          Ramond Dial 2014/08/14, 9:11 AM  Mee Hives, PT MS Acute Rehab Dept. Number: [phone]
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
## 2014-07-19 NOTE — ED Notes (Signed)
Neurology at bedside.

## 2014-07-19 NOTE — Progress Notes (Signed)
[facility] PROGRESS NOTE   Assessment/Plan: Syncope/acute enephalopathy: - Sounds like defecation neurocardiogenic syncope. Large BM with straining. UDS + for BZD's and opiates - MRI brain: Negative non contrast MRI appearance of the brain. - Ct head No acute intracranial abnormalities - EKG mild ST segment elevation in inferior lead cardiac markers negative x2. - Has remained afebrile with no leukocytosis. - LFTS' WNL except for alk phos which is barely elevated. - echo pending.    S/P cholecystectomy - afebrile, no leukocytosis. - allow diet   Tobacco abuse - counseling  DM (diabetes mellitus) - cont SSI.  HTN (hypertension) - Improved controlled.  Chronic back pain - cont home regimen. - NO ADDITIONAL NARCOTICS.   Anxiety -     Code Status: full  Family Communication: family was present at bedside, opportunity was given to ask question and all questions were answered satisfactorily at the time of interview.  Disposition: Admitted to observation in telemetry unit.  Consultants:  none  Procedures: CT abd and pelvis: Stable mild central mesenteric edema on and associated mild  probable reactive adenopathy. Interval umbilical hernia repair with associated scar tissue. 3. Stable moderately large hiatal hernia.  Mild right basilar cylindrical bronchiectasis. CT headNo acute intracranial abnormalities Ct neck: Degenerative changes in the  cervical spine. Nonspecific reversal of the usual cervical lordosis.  No displaced fractures identified.   Antibiotics:  None  HPI/Subjective: Very anxious. All sort of complains  Objective: Filed Vitals:   07/19/14 0400 07/19/14 0411 07/19/14 0540 07/19/14 0752  BP: 96/64  118/74 111/68  Pulse: 86  82 84  Temp:  98 F (36.7 C) 98.5 F (36.9 C) 97.8 F (36.6 C)  TempSrc:    Axillary  Resp: _0 SpO2: 97%  100% 95%    Intake/Output Summary (Last 24 hours) at 07/19/14 0850 Last data filed at  07/19/14 0612  Gross per 24 hour  Intake    360 ml  Output   1150 ml  Net   -790 ml   There were no vitals filed for this visit.  Exam:  General: Alert, awake, oriented x3, in no acute distress.  HEENT: No bruits, no goiter.  Heart: Regular rate and rhythm. Lungs: Good air movement, clear Abdomen: Soft, nontender, nondistended, positive bowel sounds.  Neuro: Grossly intact, nonfocal.   Data Reviewed: Basic Metabolic Panel:  Recent Labs Lab 07/18/14 2227  NA 140  K 3.9  CL 102  CO2 24  GLUCOSE 145*  BUN 11  CREATININE 0.88  CALCIUM 8.9   Liver Function Tests:  Recent Labs Lab 07/18/14 2227  AST 12  ALT 9  ALKPHOS 137*  BILITOT <0.2*  PROT 7.5  ALBUMIN 3.4*   No results found for this basename: LIPASE, AMYLASE,  in the last 168 hours  Recent Labs Lab 07/19/14 0635  AMMONIA 23   CBC:  Recent Labs Lab 07/18/14 2227  WBC 7.4  NEUTROABS 4.5  HGB 13.1  HCT 42.0  MCV 82.5  PLT 280   Cardiac Enzymes:  Recent Labs Lab 07/18/14 2227 07/19/14 0635  TROPONINI <0.30 <0.30   BNP (last 3 results) No results found for this basename: PROBNP,  in the last 8760 hours CBG:  Recent Labs Lab 07/19/14 0748  GLUCAP 125*    No results found for this or any previous visit (from the past 240 hour(s)).   Studies: Ct Abdomen Pelvis Wo Contrast  07/18/2014   CLINICAL DATA:  Abdomen and back pain. Near syncopal  episode. Previous cholecystectomy and hernia repair.  EXAM: CT ABDOMEN AND PELVIS WITHOUT CONTRAST  TECHNIQUE: Multidetector CT imaging of the abdomen and pelvis was performed following the standard protocol without IV contrast.  COMPARISON:  03/09/2014.  FINDINGS: Mild central mesenteric edema is unchanged. Mildly enlarged lymph nodes that area has not changed significantly. The largest currently measures 1.9 x 1.2 cm on image number 43 and previously measured 1.9 x 1.3 cm. Interval repair of the previously seen umbilical hernia containing fat with soft  tissue stranding in that area currently. A moderate-sized hiatal hernia is again demonstrated.  Unremarkable non contrasted appearance of the liver, spleen, pancreas, adrenal glands, kidneys, urinary bladder and uterus. No adnexal masses or enlarged lymph nodes. No gastrointestinal abnormalities. Normal appearing appendix.  Mild cylindrical bronchiectasis at the right lung base. Mild linear scarring at both lung bases. Mild lumbar lower thoracic spine degenerative changes.  IMPRESSION: 1. Stable mild central mesenteric edema on and associated mild probable reactive adenopathy. 2. Interval umbilical hernia repair with associated scar tissue. 3. Stable moderately large hiatal hernia. 4. Mild right basilar cylindrical bronchiectasis.   Electronically Signed   By: Enrique Sack M.D.   On: 07/18/2014 23:20   Ct Head Wo Contrast  07/18/2014   CLINICAL DATA:  Near syncopal episode. Lethargy in weakness. Abdomen and back pain.  EXAM: CT HEAD WITHOUT CONTRAST  CT CERVICAL SPINE WITHOUT CONTRAST  TECHNIQUE: Multidetector CT imaging of the head and cervical spine was performed following the standard protocol without intravenous contrast. Multiplanar CT image reconstructions of the cervical spine were also generated.  COMPARISON:  CT head 11/14/2012, CT head 08/06/2012, CT head and cervical spine 03/01/2009  FINDINGS: CT HEAD FINDINGS  Ventricles and sulci appear symmetrical. No mass effect or midline shift. No abnormal extra-axial fluid collections. Gray-white matter junctions are distinct. Basal cisterns are not effaced. No evidence of acute intracranial hemorrhage. No depressed skull fractures. Visualized paranasal sinuses and mastoid air cells are not opacified.  CT CERVICAL SPINE FINDINGS  Reversal of the usual cervical lordosis is likely due to patient positioning but ligamentous injury or muscle spasm can also have this appearance degenerative changes with narrowed C4-5 and C5-6 interspace levels and endplate  hypertrophic changes. No anterior subluxation of the vertebrae. Normal alignment of facet joints. C1-2 articulation appears intact. No vertebral compression deformities. No prevertebral soft tissue swelling. No focal bone lesion or bone destruction. Bone cortex and trabecular architecture appear intact.  IMPRESSION: No acute intracranial abnormalities. Degenerative changes in the cervical spine. Nonspecific reversal of the usual cervical lordosis. No displaced fractures identified.   Electronically Signed   By: Lucienne Capers M.D.   On: 07/18/2014 23:16   Ct Cervical Spine Wo Contrast  07/18/2014   CLINICAL DATA:  Near syncopal episode. Lethargy in weakness. Abdomen and back pain.  EXAM: CT HEAD WITHOUT CONTRAST  CT CERVICAL SPINE WITHOUT CONTRAST  TECHNIQUE: Multidetector CT imaging of the head and cervical spine was performed following the standard protocol without intravenous contrast. Multiplanar CT image reconstructions of the cervical spine were also generated.  COMPARISON:  CT head 11/14/2012, CT head 08/06/2012, CT head and cervical spine 03/01/2009  FINDINGS: CT HEAD FINDINGS  Ventricles and sulci appear symmetrical. No mass effect or midline shift. No abnormal extra-axial fluid collections. Gray-white matter junctions are distinct. Basal cisterns are not effaced. No evidence of acute intracranial hemorrhage. No depressed skull fractures. Visualized paranasal sinuses and mastoid air cells are not opacified.  CT CERVICAL SPINE FINDINGS  Reversal of  the usual cervical lordosis is likely due to patient positioning but ligamentous injury or muscle spasm can also have this appearance degenerative changes with narrowed C4-5 and C5-6 interspace levels and endplate hypertrophic changes. No anterior subluxation of the vertebrae. Normal alignment of facet joints. C1-2 articulation appears intact. No vertebral compression deformities. No prevertebral soft tissue swelling. No focal bone lesion or bone destruction.  Bone cortex and trabecular architecture appear intact.  IMPRESSION: No acute intracranial abnormalities. Degenerative changes in the cervical spine. Nonspecific reversal of the usual cervical lordosis. No displaced fractures identified.   Electronically Signed   By: Lucienne Capers M.D.   On: 07/18/2014 23:16   Mr Brain Wo Contrast  07/19/2014   CLINICAL DATA:  58 year old female with confusion and syncope. Altered mental status. Initial encounter.  EXAM: MRI HEAD WITHOUT CONTRAST  TECHNIQUE: Multiplanar, multiecho pulse sequences of the brain and surrounding structures were obtained without intravenous contrast.  COMPARISON:  Head CTs without contrast 07/18/2014 and earlier.  FINDINGS: Cerebral volume is normal. No restricted diffusion to suggest acute infarction. No midline shift, mass effect, evidence of mass lesion, ventriculomegaly, extra-axial collection or acute intracranial hemorrhage. Cervicomedullary junction and pituitary are within normal limits. Negative visualized cervical spine. Major intracranial vascular flow voids are preserved. Incidental cystic change to the pineal gland, encompassing just less than 1 cm. Pearline Cables and white matter signal is within normal limits throughout the brain.  Trace right mastoid effusion. Trace layering secretions in the nasopharynx. Other Visible internal auditory structures appear normal. Paranasal sinuses are clear. Visualized orbit soft tissues are within normal limits. Visualized scalp soft tissues are within normal limits. Visualized bone marrow signal is within normal limits.  IMPRESSION: Negative non contrast MRI appearance of the brain.   Electronically Signed   By: Lars Pinks M.D.   On: 07/19/2014 07:54    Scheduled Meds: . amLODipine  10 mg Oral Daily  . aspirin  325 mg Oral Daily  . atorvastatin  10 mg Oral Daily  . enoxaparin (LOVENOX) injection  40 mg Subcutaneous Q24H  . gabapentin  900 mg Oral TID  . insulin aspart  0-15 Units Subcutaneous TID WC    . insulin aspart  0-5 Units Subcutaneous QHS  . OxyCODONE  40 mg Oral 3 times per day  . pantoprazole  40 mg Oral BID   Continuous Infusions:    Charlynne Cousins  [facility] Pager [phone]. If 8PM-8AM, please contact night-coverage at [email or web address], password [facility] 07/19/2014, 8:50 AM  LOS: 1 day      **Disclaimer: This note may have been dictated with voice recognition software. Similar sounding words can inadvertently be transcribed and this note may contain transcription errors which may not have been corrected upon publication of note.**
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
## 2014-07-20 LAB — GLUCOSE, CAPILLARY
GLUCOSE-CAPILLARY: 163 mg/dL — AB (ref 70–99)
GLUCOSE-CAPILLARY: 139 mg/dL — AB (ref 70–99)
GLUCOSE-CAPILLARY: 123 mg/dL — AB (ref 70–99)
Glucose-Capillary: 130 mg/dL — ABNORMAL HIGH (ref 70–99)
Glucose-Capillary: 124 mg/dL — ABNORMAL HIGH (ref 70–99)

## 2014-07-20 NOTE — Progress Notes (Signed)
Physical Therapy Treatment Patient Details Name: Teresa Wiley MRN: [id number] DOB: 1956/03/25 Today's Date: 07/20/2014    History of Present Illness Teresa Wiley is a 58 y.o. female with Past medical history of anxiety, chronic back pain, hypertension, asthma, diabetes mellitus, status post cholecystectomy and hernia repair 7/15.     PT Comments    Pt is slowly beginning to move more today, with significant assistance to get up in chair and stand at the walker.  Her balance and lethargy are too much compromised to attempt standing, and after getting to chair agreed with pt and partner to being OOB for an hour if able.  Nsg in to share the medical concerns pt and her partner had.  Pt is being discussed for home discharge but not yet ambulatory and has steps to climb in her house.  Follow Up Recommendations  Home health PT;Supervision/Assistance - 24 hour;SNF     Equipment Recommendations  Rolling walker with 5" wheels    Recommendations for Other Services       Precautions / Restrictions Precautions Precautions: Fall Restrictions Weight Bearing Restrictions: No    Mobility  Bed Mobility Overal bed mobility: Needs Assistance Bed Mobility: Supine to Sit;Rolling Rolling: Min guard   Supine to sit: Min guard        Transfers Overall transfer level: Needs assistance Equipment used: Rolling walker (2 wheeled) Transfers: Sit to/from [facility] Sit to Stand: Mod assist Stand pivot transfers: Min assist       General transfer comment: very unsteady with extended standing for up to 45 seconds but too undependable to walk   Ambulation/Gait                 Stairs            Wheelchair Mobility    Modified Rankin (Stroke Patients Only)       Balance Overall balance assessment: Needs assistance Sitting-balance support: Feet supported;Single extremity supported Sitting balance-Leahy Scale: Fair   Postural control: Posterior  lean;Other (comment) (forward over walker with poor recovery skills) Standing balance support: Bilateral upper extremity supported Standing balance-Leahy Scale: Poor                      Cognition Arousal/Alertness: Lethargic Behavior During Therapy: Flat affect Overall Cognitive Status: Impaired/Different from baseline Area of Impairment: Attention;Following commands;Safety/judgement;Problem solving   Current Attention Level: Divided   Following Commands: Follows one step commands consistently;Follows multi-step commands inconsistently Safety/Judgement: Decreased awareness of safety Awareness: Emergent;Anticipatory Problem Solving: Slow processing;Decreased initiation General Comments: Pt was complaining of feeling very low in energy and her partner asked about her medical management for future.  Spoke with nsg about the concerns when he arrived to give meds.      Exercises      General Comments General comments (skin integrity, edema, etc.): Pt agitated and wants to go home as she discussed her feeling very low energy, and discussed with PT that she wanted to do aggressive therapy. Talked with her about just being OOB for an hour being a real goal for now, since she wanted to go right back to bed.      Pertinent Vitals/Pain Pain Assessment: No/denies pain    Home Living                      Prior Function            PT Goals (current goals can now be found in the care  plan section) Acute Rehab PT Goals Patient Stated Goal: wants to go home Progress towards PT goals: Progressing toward goals    Frequency  Min 3X/week    PT Plan Current plan remains appropriate    Co-evaluation             End of Session   Activity Tolerance: Patient limited by fatigue;Patient limited by lethargy Patient left: in chair;with call bell/phone within reach;with family/visitor present;with nursing/sitter in room     Time: 8850-2774 PT Time Calculation (min): 41  min  Charges:  $Therapeutic Activity: 38-52 mins                    G Codes:      Ramond Dial 2014/07/28, 10:57 AM  Mee Hives, PT MS Acute Rehab Dept. Number: [phone]

## 2014-07-20 NOTE — Progress Notes (Signed)
Subjective: No further syncopal episodes noted.  Patient does report though that for some time she has been having some paresthesias on the RUE and RLE that worsened after the event.  Also reports that she feels that the right side of her face is drooped and that she feels as if her face has been slapped.  Reports slurred speech, confusion and difficulty swallowing since the event as well.    Objective: Current vital signs: BP 119/66  Pulse 93  Temp(Src) 98.4 F (36.9 C) (Oral)  Resp 20  SpO2 95% Vital signs in last 24 hours: Temp:  [98.2 F (36.8 C)-98.6 F (37 C)] 98.4 F (36.9 C) (08/26 0907) Pulse Rate:  [81-97] 93 (08/26 0907) Resp:  [15-20] 20 (08/26 0400) BP: (99-119)/(62-78) 119/66 mmHg (08/26 0907) SpO2:  [94 %-96 %] 95 % (08/26 0907)  Intake/Output from previous day: 08/25 0701 - 08/26 0700 In: -  Out: 1000 [Urine:1000] Intake/Output this shift:   Nutritional status: General  Neurologic Exam: General: NAD  Mental Status:  Patient is awake, oriented to person, place, month, year, and situation.  Immediate and remote memory are intact.  Patient is able to give a clear and coherent history.  Speech fluent without evidence of aphasia.  Follows three step commands without difficulty Cranial Nerves:  II: Visual Fields are full. Pupils are equal, round, and reactive to light.  III,IV, VI: Right eyes slightly externally deviated compared to left. But with all cardinal directions she is able to move them to the full extent.  V/VII: Mild right facial droop but dentures not in place VIII: hearing is intact to voice  X: Uvula elevates symmetrically  XI: Shoulder shrug is symmetric.  XII: tongue is midline without atrophy or fasciculations.  Motor:  Lifts all extremities against gravity.   Sensory:  Decreased sensation in the last two fingers on the right hand to the elbow Deep Tendon Reflexes:  2+ and symmetric in the biceps and patellae.  Plantars:  Toes are  downgoing bilaterally.    Lab Results: Basic Metabolic Panel:  Recent Labs Lab 07/18/14 2227  NA 140  K 3.9  CL 102  CO2 24  GLUCOSE 145*  BUN 11  CREATININE 0.88  CALCIUM 8.9    Liver Function Tests:  Recent Labs Lab 07/18/14 2227  AST 12  ALT 9  ALKPHOS 137*  BILITOT <0.2*  PROT 7.5  ALBUMIN 3.4*   No results found for this basename: LIPASE, AMYLASE,  in the last 168 hours  Recent Labs Lab 07/19/14 0635  AMMONIA 23    CBC:  Recent Labs Lab 07/18/14 2227  WBC 7.4  NEUTROABS 4.5  HGB 13.1  HCT 42.0  MCV 82.5  PLT 280    Cardiac Enzymes:  Recent Labs Lab 07/18/14 2227 07/19/14 0635  TROPONINI <0.30 <0.30    Lipid Panel:  Recent Labs Lab 07/19/14 0635  CHOL 107  TRIG 98  HDL 41  CHOLHDL 2.6  VLDL 20  LDLCALC 46    CBG:  Recent Labs Lab 07/19/14 1141 07/19/14 1638 07/19/14 2004 07/19/14 2143 07/20/14 0745  GLUCAP 115* 118* 149* 141* 163*    Microbiology: Results for orders placed during the hospital encounter of 01/02/14  URINE CULTURE     Status: None   Collection Time    01/02/14  6:14 PM      Result Value Ref Range Status   Specimen Description URINE, CLEAN CATCH   Final   Special Requests NONE   Final  Culture  Setup Time     Final   Value: 01/03/2014 12:13     Performed at [place]     Final   Value: 70,000 COLONIES/ML     Performed at [facility]   Culture     Final   Value: ENTEROCOCCUS SPECIES     Performed at [facility]   Report Status 01/05/2014 FINAL   Final   Organism ID, Bacteria ENTEROCOCCUS SPECIES   Final    Coagulation Studies:  Recent Labs  07/18/14 2227  LABPROT 13.0  INR 0.98    Imaging: Ct Abdomen Pelvis Wo Contrast  07/18/2014   CLINICAL DATA:  Abdomen and back pain. Near syncopal episode. Previous cholecystectomy and hernia repair.  EXAM: CT ABDOMEN AND PELVIS WITHOUT CONTRAST  TECHNIQUE: Multidetector CT imaging of the abdomen and  pelvis was performed following the standard protocol without IV contrast.  COMPARISON:  03/09/2014.  FINDINGS: Mild central mesenteric edema is unchanged. Mildly enlarged lymph nodes that area has not changed significantly. The largest currently measures 1.9 x 1.2 cm on image number 43 and previously measured 1.9 x 1.3 cm. Interval repair of the previously seen umbilical hernia containing fat with soft tissue stranding in that area currently. A moderate-sized hiatal hernia is again demonstrated.  Unremarkable non contrasted appearance of the liver, spleen, pancreas, adrenal glands, kidneys, urinary bladder and uterus. No adnexal masses or enlarged lymph nodes. No gastrointestinal abnormalities. Normal appearing appendix.  Mild cylindrical bronchiectasis at the right lung base. Mild linear scarring at both lung bases. Mild lumbar lower thoracic spine degenerative changes.  IMPRESSION: 1. Stable mild central mesenteric edema on and associated mild probable reactive adenopathy. 2. Interval umbilical hernia repair with associated scar tissue. 3. Stable moderately large hiatal hernia. 4. Mild right basilar cylindrical bronchiectasis.   Electronically Signed   By: Enrique Sack M.D.   On: 07/18/2014 23:20   Ct Head Wo Contrast  07/18/2014   CLINICAL DATA:  Near syncopal episode. Lethargy in weakness. Abdomen and back pain.  EXAM: CT HEAD WITHOUT CONTRAST  CT CERVICAL SPINE WITHOUT CONTRAST  TECHNIQUE: Multidetector CT imaging of the head and cervical spine was performed following the standard protocol without intravenous contrast. Multiplanar CT image reconstructions of the cervical spine were also generated.  COMPARISON:  CT head 11/14/2012, CT head 08/06/2012, CT head and cervical spine 03/01/2009  FINDINGS: CT HEAD FINDINGS  Ventricles and sulci appear symmetrical. No mass effect or midline shift. No abnormal extra-axial fluid collections. Gray-white matter junctions are distinct. Basal cisterns are not effaced. No  evidence of acute intracranial hemorrhage. No depressed skull fractures. Visualized paranasal sinuses and mastoid air cells are not opacified.  CT CERVICAL SPINE FINDINGS  Reversal of the usual cervical lordosis is likely due to patient positioning but ligamentous injury or muscle spasm can also have this appearance degenerative changes with narrowed C4-5 and C5-6 interspace levels and endplate hypertrophic changes. No anterior subluxation of the vertebrae. Normal alignment of facet joints. C1-2 articulation appears intact. No vertebral compression deformities. No prevertebral soft tissue swelling. No focal bone lesion or bone destruction. Bone cortex and trabecular architecture appear intact.  IMPRESSION: No acute intracranial abnormalities. Degenerative changes in the cervical spine. Nonspecific reversal of the usual cervical lordosis. No displaced fractures identified.   Electronically Signed   By: Lucienne Capers M.D.   On: 07/18/2014 23:16   Ct Cervical Spine Wo Contrast  07/18/2014   CLINICAL DATA:  Near syncopal  episode. Lethargy in weakness. Abdomen and back pain.  EXAM: CT HEAD WITHOUT CONTRAST  CT CERVICAL SPINE WITHOUT CONTRAST  TECHNIQUE: Multidetector CT imaging of the head and cervical spine was performed following the standard protocol without intravenous contrast. Multiplanar CT image reconstructions of the cervical spine were also generated.  COMPARISON:  CT head 11/14/2012, CT head 08/06/2012, CT head and cervical spine 03/01/2009  FINDINGS: CT HEAD FINDINGS  Ventricles and sulci appear symmetrical. No mass effect or midline shift. No abnormal extra-axial fluid collections. Gray-white matter junctions are distinct. Basal cisterns are not effaced. No evidence of acute intracranial hemorrhage. No depressed skull fractures. Visualized paranasal sinuses and mastoid air cells are not opacified.  CT CERVICAL SPINE FINDINGS  Reversal of the usual cervical lordosis is likely due to patient positioning  but ligamentous injury or muscle spasm can also have this appearance degenerative changes with narrowed C4-5 and C5-6 interspace levels and endplate hypertrophic changes. No anterior subluxation of the vertebrae. Normal alignment of facet joints. C1-2 articulation appears intact. No vertebral compression deformities. No prevertebral soft tissue swelling. No focal bone lesion or bone destruction. Bone cortex and trabecular architecture appear intact.  IMPRESSION: No acute intracranial abnormalities. Degenerative changes in the cervical spine. Nonspecific reversal of the usual cervical lordosis. No displaced fractures identified.   Electronically Signed   By: Lucienne Capers M.D.   On: 07/18/2014 23:16   Mr Brain Wo Contrast  07/19/2014   CLINICAL DATA:  58 year old female with confusion and syncope. Altered mental status. Initial encounter.  EXAM: MRI HEAD WITHOUT CONTRAST  TECHNIQUE: Multiplanar, multiecho pulse sequences of the brain and surrounding structures were obtained without intravenous contrast.  COMPARISON:  Head CTs without contrast 07/18/2014 and earlier.  FINDINGS: Cerebral volume is normal. No restricted diffusion to suggest acute infarction. No midline shift, mass effect, evidence of mass lesion, ventriculomegaly, extra-axial collection or acute intracranial hemorrhage. Cervicomedullary junction and pituitary are within normal limits. Negative visualized cervical spine. Major intracranial vascular flow voids are preserved. Incidental cystic change to the pineal gland, encompassing just less than 1 cm. Pearline Cables and white matter signal is within normal limits throughout the brain.  Trace right mastoid effusion. Trace layering secretions in the nasopharynx. Other Visible internal auditory structures appear normal. Paranasal sinuses are clear. Visualized orbit soft tissues are within normal limits. Visualized scalp soft tissues are within normal limits. Visualized bone marrow signal is within normal  limits.  IMPRESSION: Negative non contrast MRI appearance of the brain.   Electronically Signed   By: Lars Pinks M.D.   On: 07/19/2014 07:54    Medications:  I have reviewed the patient's current medications. Scheduled: . amLODipine  10 mg Oral Daily  . aspirin  325 mg Oral Daily  . atorvastatin  10 mg Oral Daily  . enoxaparin (LOVENOX) injection  40 mg Subcutaneous Q24H  . gabapentin  900 mg Oral TID  . insulin aspart  0-15 Units Subcutaneous TID WC  . insulin aspart  0-5 Units Subcutaneous QHS  . OxyCODONE  40 mg Oral 3 times per day  . pantoprazole  40 mg Oral BID    Assessment/Plan: Patient presenting after a syncopal event.  Patient reports that she has had two strokes in the past therefore imaging was performed to rule out the possibility of an acute event.  Patient with multiple risk factors.  MRI of the brain reviewed and is unremarkable.  There does not appear to be evidence of chronic or acute infarcts.  Patient's current complaints  of paresthesias may be peripheral in origin.  Description of symptoms suggest this along with examination.    Recommendations: 1.  No evidence of acute infarct.  No further stroke work up indicated. Would continue ASA daily 2.  Patient to have MRI of the cervical spine.  This may be performed as an outpatient and followed up by her PCP since patient is requesting to be able to go home as soon as possible.   3.  Continue Gabapentin at current dose.     LOS: 2 days   Alexis Goodell, MD [facility] [phone] 07/20/2014  9:49 AM

## 2014-07-20 NOTE — Discharge Summary (Signed)
Physician Discharge Summary  Teresa Wiley JKK:[id number] DOB: 02-21-1956 DOA: 07/18/2014  PCP: Ellender Hose, MD  Admit date: 07/18/2014 Discharge date: 07/20/2014  Time spent: >45 minutes   Discharge Condition: stable Diet recommendation: heart healthy, low carb, lactose free and low fat  Discharge Diagnoses:  Principal Problem:   Syncope Active Problems:   Anxiety   Chronic back pain   Tobacco abuse   DM (diabetes mellitus)   HTN (hypertension)   Acute encephalopathy   S/P cholecystectomy   Abnormal EKG   History of present illness:  58 y/o female with h/o anxiety, chronic back pain, hypertension, asthma, diabetes mellitus, status post cholecystectomy and hernia repair 7/15. She presented to the ER after a syncopal episode in relation to a BM. She states that she felt the need to use the restroom and once on the toilet she passed out. When she will she was on the floor sweating profusely and vomiting. She had what she states was an extremely large bowel movement that almost filled the entire toilet. In addition she had abdominal pain. She had eaten at [employer] which was bought locally containing chicken and veggies just prior to this episode. She was found by her son after the episode and noted to be confused with some slurred speech.  Hospital Course:  Syncope/mild acute encephalopathy -Neuro workup including an MRI was negative -Neurology service has evaluated the patient and no further workup is recommended -Echo has been obtained and no significant findings are noted to explain the episode -Suspect syncope most likely vasovagal in relation to extensive bowel movement.  Abdominal pain/vomiting/diarrhea -Possibly bacterial or viral cause in relation to something that she had eaten -CT scan of the abdomen and pelvis revealed some mild central mesenteric edema and possible reactive adenopathy -He is recently had a cholecystectomy and overall had been recovering well  from this. -She is currently still mildly nauseated but has had no further episodes of abdominal pain or diarrhea  Moderate to large hiatal hernia -Noted on CT scan of the abdomen -She is already taking Nexium as outpatient for her gastroesophageal reflux disease  Procedures:  none  Consultations:  neurology  Discharge Exam: There were no vitals filed for this visit. Filed Vitals:   07/20/14 1150  BP: 126/83  Pulse: 85  Temp: 98.9 F (37.2 C)  Resp: 18    General: Awake alert oriented x3, in no acute distress Cardiovascular: Regular rate and rhythm, no murmurs Respiratory: Clear to auscultation bilaterally GI: Soft, nontender, nondistended, bowel sounds positive  Discharge Instructions You were cared for by a hospitalist during your hospital stay. If you have any questions about your discharge medications or the care you received while you were in the hospital after you are discharged, you can call the unit and asked to speak with the hospitalist on call if the hospitalist that took care of you is not available. Once you are discharged, your primary care physician will handle any further medical issues. Please note that NO REFILLS for any discharge medications will be authorized once you are discharged, as it is imperative that you return to your primary care physician (or establish a relationship with a primary care physician if you do not have one) for your aftercare needs so that they can reassess your need for medications and monitor your lab values.     Medication List    ASK your doctor about these medications       albuterol 108 (90 BASE) MCG/ACT inhaler  Commonly known  as:  PROVENTIL HFA;VENTOLIN HFA  Inhale 2 puffs into the lungs every 6 (six) hours as needed. For shortness of breath     ALPRAZolam 1 MG tablet  Commonly known as:  XANAX  Take 2 mg by mouth 2 (two) times daily as needed for anxiety.     amLODipine 10 MG tablet  Commonly known as:  NORVASC   Take 10 mg by mouth daily.     atorvastatin 10 MG tablet  Commonly known as:  LIPITOR  Take 10 mg by mouth daily.     docusate sodium 100 MG capsule  Commonly known as:  COLACE  Take 100 mg by mouth daily as needed for mild constipation.     esomeprazole 40 MG capsule  Commonly known as:  NEXIUM  Take 40 mg by mouth 2 (two) times daily.     gabapentin 300 MG capsule  Commonly known as:  NEURONTIN  Take 900 mg by mouth 3 (three) times daily.     lidocaine 5 % ointment  Commonly known as:  XYLOCAINE  Apply 1 application topically 3 (three) times daily as needed for mild pain.     metFORMIN 500 MG tablet  Commonly known as:  GLUCOPHAGE  Take 500 mg by mouth 2 (two) times daily with a meal.     methocarbamol 500 MG tablet  Commonly known as:  ROBAXIN  Take 500 mg by mouth 3 (three) times daily as needed for muscle spasms.     ondansetron 4 MG tablet  Commonly known as:  ZOFRAN  Take 1 tablet (4 mg total) by mouth every 6 (six) hours.     oxyCODONE 40 MG 12 hr tablet  Commonly known as:  OXYCONTIN  Take 40 mg by mouth 3 (three) times daily.     oxyCODONE-acetaminophen 10-325 MG per tablet  Commonly known as:  PERCOCET  Take 1 tablet by mouth every 4 (four) hours as needed (pain).       Allergies  Allergen Reactions  . Erythromycin     REACTION: nausea and vomiting  . Iodine     REACTION: Hives, throat closes  . Shellfish Allergy       The results of significant diagnostics from this hospitalization (including imaging, microbiology, ancillary and laboratory) are listed below for reference.    Significant Diagnostic Studies: Ct Abdomen Pelvis Wo Contrast  07/18/2014   CLINICAL DATA:  Abdomen and back pain. Near syncopal episode. Previous cholecystectomy and hernia repair.  EXAM: CT ABDOMEN AND PELVIS WITHOUT CONTRAST  TECHNIQUE: Multidetector CT imaging of the abdomen and pelvis was performed following the standard protocol without IV contrast.  COMPARISON:   03/09/2014.  FINDINGS: Mild central mesenteric edema is unchanged. Mildly enlarged lymph nodes that area has not changed significantly. The largest currently measures 1.9 x 1.2 cm on image number 43 and previously measured 1.9 x 1.3 cm. Interval repair of the previously seen umbilical hernia containing fat with soft tissue stranding in that area currently. A moderate-sized hiatal hernia is again demonstrated.  Unremarkable non contrasted appearance of the liver, spleen, pancreas, adrenal glands, kidneys, urinary bladder and uterus. No adnexal masses or enlarged lymph nodes. No gastrointestinal abnormalities. Normal appearing appendix.  Mild cylindrical bronchiectasis at the right lung base. Mild linear scarring at both lung bases. Mild lumbar lower thoracic spine degenerative changes.  IMPRESSION: 1. Stable mild central mesenteric edema on and associated mild probable reactive adenopathy. 2. Interval umbilical hernia repair with associated scar tissue. 3. Stable moderately large hiatal  hernia. 4. Mild right basilar cylindrical bronchiectasis.   Electronically Signed   By: Enrique Sack M.D.   On: 07/18/2014 23:20   Ct Head Wo Contrast  07/18/2014   CLINICAL DATA:  Near syncopal episode. Lethargy in weakness. Abdomen and back pain.  EXAM: CT HEAD WITHOUT CONTRAST  CT CERVICAL SPINE WITHOUT CONTRAST  TECHNIQUE: Multidetector CT imaging of the head and cervical spine was performed following the standard protocol without intravenous contrast. Multiplanar CT image reconstructions of the cervical spine were also generated.  COMPARISON:  CT head 11/14/2012, CT head 08/06/2012, CT head and cervical spine 03/01/2009  FINDINGS: CT HEAD FINDINGS  Ventricles and sulci appear symmetrical. No mass effect or midline shift. No abnormal extra-axial fluid collections. Gray-white matter junctions are distinct. Basal cisterns are not effaced. No evidence of acute intracranial hemorrhage. No depressed skull fractures. Visualized  paranasal sinuses and mastoid air cells are not opacified.  CT CERVICAL SPINE FINDINGS  Reversal of the usual cervical lordosis is likely due to patient positioning but ligamentous injury or muscle spasm can also have this appearance degenerative changes with narrowed C4-5 and C5-6 interspace levels and endplate hypertrophic changes. No anterior subluxation of the vertebrae. Normal alignment of facet joints. C1-2 articulation appears intact. No vertebral compression deformities. No prevertebral soft tissue swelling. No focal bone lesion or bone destruction. Bone cortex and trabecular architecture appear intact.  IMPRESSION: No acute intracranial abnormalities. Degenerative changes in the cervical spine. Nonspecific reversal of the usual cervical lordosis. No displaced fractures identified.   Electronically Signed   By: Lucienne Capers M.D.   On: 07/18/2014 23:16   Ct Cervical Spine Wo Contrast  07/18/2014   CLINICAL DATA:  Near syncopal episode. Lethargy in weakness. Abdomen and back pain.  EXAM: CT HEAD WITHOUT CONTRAST  CT CERVICAL SPINE WITHOUT CONTRAST  TECHNIQUE: Multidetector CT imaging of the head and cervical spine was performed following the standard protocol without intravenous contrast. Multiplanar CT image reconstructions of the cervical spine were also generated.  COMPARISON:  CT head 11/14/2012, CT head 08/06/2012, CT head and cervical spine 03/01/2009  FINDINGS: CT HEAD FINDINGS  Ventricles and sulci appear symmetrical. No mass effect or midline shift. No abnormal extra-axial fluid collections. Gray-white matter junctions are distinct. Basal cisterns are not effaced. No evidence of acute intracranial hemorrhage. No depressed skull fractures. Visualized paranasal sinuses and mastoid air cells are not opacified.  CT CERVICAL SPINE FINDINGS  Reversal of the usual cervical lordosis is likely due to patient positioning but ligamentous injury or muscle spasm can also have this appearance degenerative  changes with narrowed C4-5 and C5-6 interspace levels and endplate hypertrophic changes. No anterior subluxation of the vertebrae. Normal alignment of facet joints. C1-2 articulation appears intact. No vertebral compression deformities. No prevertebral soft tissue swelling. No focal bone lesion or bone destruction. Bone cortex and trabecular architecture appear intact.  IMPRESSION: No acute intracranial abnormalities. Degenerative changes in the cervical spine. Nonspecific reversal of the usual cervical lordosis. No displaced fractures identified.   Electronically Signed   By: Lucienne Capers M.D.   On: 07/18/2014 23:16   Mr Brain Wo Contrast  07/19/2014   CLINICAL DATA:  58 year old female with confusion and syncope. Altered mental status. Initial encounter.  EXAM: MRI HEAD WITHOUT CONTRAST  TECHNIQUE: Multiplanar, multiecho pulse sequences of the brain and surrounding structures were obtained without intravenous contrast.  COMPARISON:  Head CTs without contrast 07/18/2014 and earlier.  FINDINGS: Cerebral volume is normal. No restricted diffusion to suggest acute infarction.  No midline shift, mass effect, evidence of mass lesion, ventriculomegaly, extra-axial collection or acute intracranial hemorrhage. Cervicomedullary junction and pituitary are within normal limits. Negative visualized cervical spine. Major intracranial vascular flow voids are preserved. Incidental cystic change to the pineal gland, encompassing just less than 1 cm. Pearline Cables and white matter signal is within normal limits throughout the brain.  Trace right mastoid effusion. Trace layering secretions in the nasopharynx. Other Visible internal auditory structures appear normal. Paranasal sinuses are clear. Visualized orbit soft tissues are within normal limits. Visualized scalp soft tissues are within normal limits. Visualized bone marrow signal is within normal limits.  IMPRESSION: Negative non contrast MRI appearance of the brain.    Electronically Signed   By: Lars Pinks M.D.   On: 07/19/2014 07:54    Microbiology: No results found for this or any previous visit (from the past 240 hour(s)).   Labs: Basic Metabolic Panel:  Recent Labs Lab 07/18/14 2227  NA 140  K 3.9  CL 102  CO2 24  GLUCOSE 145*  BUN 11  CREATININE 0.88  CALCIUM 8.9   Liver Function Tests:  Recent Labs Lab 07/18/14 2227  AST 12  ALT 9  ALKPHOS 137*  BILITOT <0.2*  PROT 7.5  ALBUMIN 3.4*   No results found for this basename: LIPASE, AMYLASE,  in the last 168 hours  Recent Labs Lab 07/19/14 0635  AMMONIA 23   CBC:  Recent Labs Lab 07/18/14 2227  WBC 7.4  NEUTROABS 4.5  HGB 13.1  HCT 42.0  MCV 82.5  PLT 280   Cardiac Enzymes:  Recent Labs Lab 07/18/14 2227 07/19/14 0635  TROPONINI <0.30 <0.30   BNP: BNP (last 3 results) No results found for this basename: PROBNP,  in the last 8760 hours CBG:  Recent Labs Lab 07/19/14 1638 07/19/14 2004 07/19/14 2143 07/20/14 0745 07/20/14 1141  GLUCAP 118* 149* 141* 163* 139*       SignedDebbe Odea, MD [facility] 07/20/2014, 1:11 PM

## 2014-07-21 LAB — GLUCOSE, CAPILLARY
GLUCOSE-CAPILLARY: 93 mg/dL (ref 70–99)
Glucose-Capillary: 97 mg/dL (ref 70–99)

## 2014-07-21 NOTE — Care Management Note (Signed)
    Page 1 of 1   07/21/2014     12:25:46 PM CARE MANAGEMENT NOTE 07/21/2014  Patient:  Teresa Wiley, Teresa Wiley   Account Number:  [id number]  Date Initiated:  07/21/2014  Documentation initiated by:  GRAVES-BIGELOW,Miyako Oelke  Subjective/Objective Assessment:   Pt admitted for syncope and AMS. Pt is from home with partner.     Action/Plan:   CM did speak to pt in ref to disposition needs. CM has set pt up with [facility], PT services. Pt did request SNF since she is deconditioned. CM did relay request to [place]. CSW to speak with pt.   Anticipated DC Date:  07/21/2014   Anticipated DC Plan:  [place]  CM consult      [facility] Choice  HOME HEALTH   Choice offered to / List presented to:  C-1 Patient        [place] arranged  HH-1 RN  [facility] OT      [facility] agency  [place]   Status of service:  Completed, signed off Medicare Important Message given?  NO (If response is "NO", the following Medicare IM given date fields will be blank) Date Medicare IM given:   Medicare IM given by:   Date Additional Medicare IM given:   Additional Medicare IM given by:    Discharge Disposition:  [place]  Per UR Regulation:  Reviewed for med. necessity/level of care/duration of stay  If discussed at [place] of Stay Meetings, dates discussed:    Comments:  If SNF is not an option then pt will need to be d/c today with [facility] services. Pt will need Hospital f/u with PCP- pt states not willing to change at this time due to PCP knows pt. No Clinic appointment set up.

## 2014-07-21 NOTE — Progress Notes (Signed)
Occupational Therapy Treatment Patient Details Name: Teresa Wiley MRN: [id number] DOB: [birth year] Today's Date: 07/21/2014    History of present illness Teresa Wiley is a 58 y.o. female with Past medical history of anxiety, chronic back pain, hypertension, asthma, diabetes mellitus, status post cholecystectomy and hernia repair 7/15.    OT comments  Patient found seated EOB with "wife" present at bedside. This therapist talked with patient and wife regarding OT and functional activities at home. Patient's 02 sats seated EOB=90%. Patient stood with moderate assistance and ambulated > BSC placed ~5 feet from bed; patient transferred with moderate assistance. Patient with increased lethargy and fatigue during session. Patient sat on [facility] and discussed d/c planning with therapist and RN. Patient then ambulated back to EOB and sats rechecked =93 %. This OT is recommending SNF; 24/7 supervision/assistance post d/c. Patient requires up to moderate assistance for functional tasks and functional mobility. Per patient and wife report, wife is unhealthy and unable to assistance at home, but both state they have children that can assist. Patient states she wants to go home and is hesitant about SNF. Continue to recommend SNF secondary to patient's decline in functional status. PTA, patient was independent.    Follow Up Recommendations  Supervision/Assistance - 24 hour;SNF    Equipment Recommendations  Tub/shower seat;3 in 1 bedside comode    Recommendations for Other Services  n/a at this time.    Precautions / Restrictions Precautions Precautions: Fall Restrictions Weight Bearing Restrictions: No       Mobility  Transfers Overall transfer level: Needs assistance Equipment used: Rolling walker (2 wheeled) Transfers: Stand Pivot Transfers Sit to Stand: Mod assist Stand pivot transfers: Mod assist                ADL Overall ADL's : Needs assistance/impaired                          Toilet Transfer: Moderate assistance;RW;Ambulation Toilet Transfer Details (indicate cue type and reason):  (Patient with increased lethargy during toielt transfer)           General ADL Comments: Patient with increased lathargy during functional ambulation and toilet transfer. While seated EOB, patient's 02 sats =90%, during mobility sats =93%.                 Cognition   Behavior During Therapy: Flat affect;Anxious Overall Cognitive Status: Impaired/Different from baseline Area of Impairment: Attention;Following commands;Safety/judgement;Problem solving   Current Attention Level: Divided Memory: Decreased recall of precautions  Following Commands: Follows one step commands consistently;Follows multi-step commands inconsistently Safety/Judgement: Decreased awareness of safety Awareness: Emergent;Anticipatory Problem Solving: Slow processing;Decreased initiation                   Pertinent Vitals/ Pain       Pain Assessment: No/denies pain         Frequency Min 2X/week     Progress Toward Goals  OT Goals(current goals can now be found in the care plan section)  Progress towards OT goals: Progressing toward goals  Acute Rehab OT Goals Patient Stated Goal: wants to go home OT Goal Formulation: With patient/family  Plan Discharge plan remains appropriate       End of Session Equipment Utilized During Treatment: Rolling walker;Gait belt   Activity Tolerance Patient limited by lethargy;Patient limited by fatigue   Patient Left in bed;with family/visitor present;with call bell/phone within reach   Nurse Communication Other (comment) (notified RN of patient's status  and discussed d/c plan)     Time: 5329-9242 OT Time Calculation (min): 32 min  Charges: OT General Charges $OT Visit: 1 Procedure OT Treatments $Self Care/Home Management : 23-37 mins  Tramya Schoenfelder 07/21/2014, 11:41 AM

## 2014-07-21 NOTE — Progress Notes (Signed)
Clinical Social Work Department BRIEF PSYCHOSOCIAL ASSESSMENT 07/21/2014  Patient:  Teresa Wiley, Teresa Wiley     Account Number:  [id number]     Admit date:  07/18/2014  Clinical Social Worker:  Adair Laundry  Date/Time:  07/21/2014 02:00 PM  Referred by:  Physician  Date Referred:  07/21/2014 Referred for  SNF Placement   Other Referral:   Interview type:  Patient Other interview type:    PSYCHOSOCIAL DATA Living Status:  FAMILY Admitted from facility:   Level of care:   Primary support name:  Rigoberto Noel [phone] Primary support relationship to patient:  SIBLING Degree of support available:   Pt has strong support    CURRENT CONCERNS Current Concerns  Post-Acute Placement   Other Concerns:    SOCIAL WORK ASSESSMENT / PLAN CSW informed plan is for pt to dc home today but pt having doubts about being able to manage at home. CSW visited pt room and spoke with pt about recommendations. Pt informed CSW she is not worried about going home and does want to dc home. However, she was informed that the recommendation is for SNF that is why she was requesting to speak with CSW. Pt wondering if she can dc home and then possible transfer to SNF. CSW explained that hospital to SNF is much easier process but home health company can help assist if pt wants to dc home first. Pt informed CSW she wants to get matters settled at home and see how she does. CSW informed pt that if pt was agreeable CSW could send referral to facilities and inform that pt going home but may be admitted from community. Pt thankful for this and requested referral be sent to [facility] facilities. CSW called facility representative from [facility] and informed. At this time, pt has no further hospital social work needs.   Assessment/plan status:  No Further Intervention Required Other assessment/ plan:   Information/referral to community resources:   Snf list denied    PATIENT'S/FAMILY'S RESPONSE TO PLAN OF  CARE: Pt is agreeable to dc home.       [place], [place]

## 2014-07-21 NOTE — Progress Notes (Signed)
Patient evaluated today. She states she is still nauseated but has been eating but is not vomiting. She has not had a BM since being admitted. No further abdominal pain but abdomen is distended. I have reviewed her abdominal CT finding with Dr Benson Norway who does not feel that there are any significant findings that require and inpatient eval. At this point, she is stable to be discharged from the hospital with close outpatient follow up. I feel that her episode of diarrhea was either a viral gastroenteritis or osmotic diarrhea and do not think further inpatient work up is needed. Her syncopal episode was likely vaso-vagal. Neuro work up including a CT of the neck and MRI are negative and per Neuro, there is no further testing necessary.    I have asked her to see her PCP in 1 wk if resolution in symptoms (nausea and bloating) does not occur. I have explained that her recent cholecystectomy may account for the fact that she may feel bloated and have diarrhea at times but this should eventually improve.   See d/c summary from 8/26.   Debbe Odea, MD

## 2014-10-16 ENCOUNTER — Emergency Department (HOSPITAL_COMMUNITY): Payer: Medicaid Other

## 2014-10-16 ENCOUNTER — Inpatient Hospital Stay (HOSPITAL_COMMUNITY)
Admission: EM | Admit: 2014-10-16 | Discharge: 2014-10-24 | DRG: 286 | Disposition: A | Discharge status: Home Health | Payer: Medicaid Other | Attending: Internal Medicine | Admitting: Internal Medicine

## 2014-10-16 ENCOUNTER — Encounter (HOSPITAL_COMMUNITY): Payer: Self-pay | Admitting: Emergency Medicine

## 2014-10-16 DIAGNOSIS — I5033 Acute on chronic diastolic (congestive) heart failure: Principal | ICD-10-CM | POA: Diagnosis present

## 2014-10-16 DIAGNOSIS — J45909 Unspecified asthma, uncomplicated: Secondary | ICD-10-CM | POA: Diagnosis present

## 2014-10-16 DIAGNOSIS — M549 Dorsalgia, unspecified: Secondary | ICD-10-CM | POA: Diagnosis present

## 2014-10-16 DIAGNOSIS — K219 Gastro-esophageal reflux disease without esophagitis: Secondary | ICD-10-CM | POA: Diagnosis present

## 2014-10-16 DIAGNOSIS — G9340 Encephalopathy, unspecified: Secondary | ICD-10-CM | POA: Diagnosis present

## 2014-10-16 DIAGNOSIS — Z6836 Body mass index (BMI) 36.0-36.9, adult: Secondary | ICD-10-CM

## 2014-10-16 DIAGNOSIS — J449 Chronic obstructive pulmonary disease, unspecified: Secondary | ICD-10-CM | POA: Diagnosis present

## 2014-10-16 DIAGNOSIS — F1120 Opioid dependence, uncomplicated: Secondary | ICD-10-CM | POA: Diagnosis present

## 2014-10-16 DIAGNOSIS — R042 Hemoptysis: Secondary | ICD-10-CM

## 2014-10-16 DIAGNOSIS — R0902 Hypoxemia: Secondary | ICD-10-CM | POA: Diagnosis present

## 2014-10-16 DIAGNOSIS — R4182 Altered mental status, unspecified: Secondary | ICD-10-CM

## 2014-10-16 DIAGNOSIS — I10 Essential (primary) hypertension: Secondary | ICD-10-CM | POA: Diagnosis present

## 2014-10-16 DIAGNOSIS — I272 Other secondary pulmonary hypertension: Secondary | ICD-10-CM | POA: Diagnosis present

## 2014-10-16 DIAGNOSIS — Z720 Tobacco use: Secondary | ICD-10-CM | POA: Diagnosis present

## 2014-10-16 DIAGNOSIS — E876 Hypokalemia: Secondary | ICD-10-CM | POA: Diagnosis present

## 2014-10-16 DIAGNOSIS — J189 Pneumonia, unspecified organism: Secondary | ICD-10-CM | POA: Diagnosis present

## 2014-10-16 DIAGNOSIS — D649 Anemia, unspecified: Secondary | ICD-10-CM | POA: Diagnosis present

## 2014-10-16 DIAGNOSIS — K761 Chronic passive congestion of liver: Secondary | ICD-10-CM | POA: Diagnosis present

## 2014-10-16 DIAGNOSIS — E1165 Type 2 diabetes mellitus with hyperglycemia: Secondary | ICD-10-CM

## 2014-10-16 DIAGNOSIS — R031 Nonspecific low blood-pressure reading: Secondary | ICD-10-CM | POA: Diagnosis present

## 2014-10-16 DIAGNOSIS — E662 Morbid (severe) obesity with alveolar hypoventilation: Secondary | ICD-10-CM | POA: Diagnosis present

## 2014-10-16 DIAGNOSIS — E119 Type 2 diabetes mellitus without complications: Secondary | ICD-10-CM

## 2014-10-16 DIAGNOSIS — I959 Hypotension, unspecified: Secondary | ICD-10-CM | POA: Diagnosis present

## 2014-10-16 DIAGNOSIS — E785 Hyperlipidemia, unspecified: Secondary | ICD-10-CM | POA: Diagnosis present

## 2014-10-16 DIAGNOSIS — J9601 Acute respiratory failure with hypoxia: Secondary | ICD-10-CM | POA: Diagnosis present

## 2014-10-16 DIAGNOSIS — Y95 Nosocomial condition: Secondary | ICD-10-CM | POA: Diagnosis present

## 2014-10-16 DIAGNOSIS — G4733 Obstructive sleep apnea (adult) (pediatric): Secondary | ICD-10-CM | POA: Diagnosis present

## 2014-10-16 DIAGNOSIS — A419 Sepsis, unspecified organism: Secondary | ICD-10-CM | POA: Diagnosis present

## 2014-10-16 DIAGNOSIS — J9602 Acute respiratory failure with hypercapnia: Secondary | ICD-10-CM | POA: Diagnosis present

## 2014-10-16 DIAGNOSIS — N179 Acute kidney failure, unspecified: Secondary | ICD-10-CM | POA: Diagnosis present

## 2014-10-16 DIAGNOSIS — F17210 Nicotine dependence, cigarettes, uncomplicated: Secondary | ICD-10-CM | POA: Diagnosis present

## 2014-10-16 DIAGNOSIS — F419 Anxiety disorder, unspecified: Secondary | ICD-10-CM | POA: Diagnosis present

## 2014-10-16 DIAGNOSIS — G894 Chronic pain syndrome: Secondary | ICD-10-CM | POA: Diagnosis present

## 2014-10-16 DIAGNOSIS — Z91041 Radiographic dye allergy status: Secondary | ICD-10-CM

## 2014-10-16 DIAGNOSIS — R0689 Other abnormalities of breathing: Secondary | ICD-10-CM | POA: Diagnosis present

## 2014-10-16 DIAGNOSIS — R740 Nonspecific elevation of levels of transaminase and lactic acid dehydrogenase [LDH]: Secondary | ICD-10-CM | POA: Diagnosis present

## 2014-10-16 LAB — COMPREHENSIVE METABOLIC PANEL
ALT: 12 U/L (ref 0–35)
ANION GAP: 14 (ref 5–15)
AST: 12 U/L (ref 0–37)
Albumin: 3.1 g/dL — ABNORMAL LOW (ref 3.5–5.2)
Alkaline Phosphatase: 154 U/L — ABNORMAL HIGH (ref 39–117)
BILIRUBIN TOTAL: 0.7 mg/dL (ref 0.3–1.2)
BUN: 14 mg/dL (ref 6–23)
CHLORIDE: 102 meq/L (ref 96–112)
CO2: 25 mEq/L (ref 19–32)
Calcium: 8.7 mg/dL (ref 8.4–10.5)
Creatinine, Ser: 1.11 mg/dL — ABNORMAL HIGH (ref 0.50–1.10)
GFR calc non Af Amer: 54 mL/min — ABNORMAL LOW (ref >90)
GFR, EST AFRICAN AMERICAN: 63 mL/min — AB (ref >90)
GLUCOSE: 128 mg/dL — AB (ref 70–99)
POTASSIUM: 4.5 meq/L (ref 3.7–5.3)
Sodium: 141 mEq/L (ref 137–147)
Total Protein: 7.7 g/dL (ref 6.0–8.3)

## 2014-10-16 LAB — I-STAT ARTERIAL BLOOD GAS, ED
ACID-BASE EXCESS: 4 mmol/L — AB (ref 0.0–2.0)
BICARBONATE: 31 meq/L — AB (ref 20.0–24.0)
O2 Saturation: 99 %
TCO2: 33 mmol/L (ref 0–100)
pCO2 arterial: 60.1 mmHg (ref 35.0–45.0)
pH, Arterial: 7.32 — ABNORMAL LOW (ref 7.350–7.450)
pO2, Arterial: 130 mmHg — ABNORMAL HIGH (ref 80.0–100.0)

## 2014-10-16 LAB — URINALYSIS, ROUTINE W REFLEX MICROSCOPIC
GLUCOSE, UA: NEGATIVE mg/dL
Hgb urine dipstick: NEGATIVE
KETONES UR: NEGATIVE mg/dL
LEUKOCYTES UA: NEGATIVE
Nitrite: NEGATIVE
Protein, ur: NEGATIVE mg/dL
Specific Gravity, Urine: 1.026 (ref 1.005–1.030)
Urobilinogen, UA: 1 mg/dL (ref 0.0–1.0)
pH: 5.5 (ref 5.0–8.0)

## 2014-10-16 LAB — RAPID URINE DRUG SCREEN, HOSP PERFORMED
Amphetamines: NOT DETECTED
BENZODIAZEPINES: POSITIVE — AB
Barbiturates: NOT DETECTED
Cocaine: NOT DETECTED
Opiates: POSITIVE — AB
TETRAHYDROCANNABINOL: NOT DETECTED

## 2014-10-16 LAB — PROTIME-INR
INR: 1.01 (ref 0.00–1.49)
Prothrombin Time: 13.4 seconds (ref 11.6–15.2)

## 2014-10-16 LAB — CBC
HCT: 36.3 % (ref 36.0–46.0)
HEMOGLOBIN: 10.5 g/dL — AB (ref 12.0–15.0)
MCH: 25.2 pg — ABNORMAL LOW (ref 26.0–34.0)
MCHC: 28.9 g/dL — ABNORMAL LOW (ref 30.0–36.0)
MCV: 87.3 fL (ref 78.0–100.0)
Platelets: 381 10*3/uL (ref 150–400)
RBC: 4.16 MIL/uL (ref 3.87–5.11)
RDW: 16.1 % — ABNORMAL HIGH (ref 11.5–15.5)
WBC: 11.9 10*3/uL — AB (ref 4.0–10.5)

## 2014-10-16 LAB — I-STAT CG4 LACTIC ACID, ED: LACTIC ACID, VENOUS: 1.48 mmol/L (ref 0.5–2.2)

## 2014-10-16 LAB — PRO B NATRIURETIC PEPTIDE: PRO B NATRI PEPTIDE: 1935 pg/mL — AB (ref 0–125)

## 2014-10-16 LAB — CBG MONITORING, ED: Glucose-Capillary: 129 mg/dL — ABNORMAL HIGH (ref 70–99)

## 2014-10-16 MED ORDER — PIPERACILLIN-TAZOBACTAM 3.375 G IVPB 30 MIN
3.3750 g | Freq: Once | INTRAVENOUS | Status: AC
  Administered 2014-10-16: 3.375 g via INTRAVENOUS
  Filled 2014-10-16: qty 50

## 2014-10-16 MED ORDER — FUROSEMIDE 10 MG/ML IJ SOLN
20.0000 mg | Freq: Once | INTRAMUSCULAR | Status: AC
  Administered 2014-10-16: 20 mg via INTRAVENOUS
  Filled 2014-10-16: qty 2

## 2014-10-16 MED ORDER — VANCOMYCIN HCL IN DEXTROSE 1-5 GM/200ML-% IV SOLN
1000.0000 mg | Freq: Once | INTRAVENOUS | Status: AC
  Administered 2014-10-16: 1000 mg via INTRAVENOUS
  Filled 2014-10-16: qty 200

## 2014-10-16 MED ORDER — FUROSEMIDE 10 MG/ML IJ SOLN: 80.0000 mg | Freq: Once | INTRAMUSCULAR | Status: DC

## 2014-10-16 NOTE — ED Notes (Addendum)
This RN noted pt's O2 sats to be 46% on room air. Mingo Amber, MD called to the bedside, and pt placed on non-rebreather.

## 2014-10-16 NOTE — ED Notes (Signed)
Patient is currently wearing non rebreather at 73.

## 2014-10-16 NOTE — ED Notes (Addendum)
Pt arrives via EMS from home with c/o altered mental status since today. Son at bedside stating that she isn't making sense when she talks, states she is making comments/statements not relevant to conversation. One episode of mucous/bloody cough, about a paper towel full. Hx pain/anxiety. Pt states she's felt "loopy" since yesterday. A/ox4

## 2014-10-16 NOTE — ED Notes (Signed)
Dr. Mingo Amber is at the bedside.

## 2014-10-16 NOTE — ED Notes (Signed)
Reported to Dr. Mingo Amber that the patient's in and out cath drained 412mL. Urine sample sent. MD acknowledges, no new orders received

## 2014-10-16 NOTE — ED Notes (Signed)
Respiratory therapist paged for collection of abg
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
## 2014-10-16 NOTE — ED Notes (Signed)
Per respiratory therapist, pt taken off of non-rebreather and placed on 50% venturi mask.

## 2014-10-16 NOTE — ED Notes (Signed)
Spoke to Dr. Mingo Amber in regards to patient's respiratory status.  On venti mask, o2 sats dropped to 86%.  Placed on non-rebreather temporarily, o2 sats increased to 98%.  Placed back on ventimask.  MD acknowledges, will order protable chest x-ray.

## 2014-10-16 NOTE — ED Notes (Signed)
2 RN's present to attempt IV access and blood collection

## 2014-10-17 ENCOUNTER — Inpatient Hospital Stay (HOSPITAL_COMMUNITY): Payer: Medicaid Other

## 2014-10-17 ENCOUNTER — Encounter (HOSPITAL_COMMUNITY): Payer: Self-pay | Admitting: Emergency Medicine

## 2014-10-17 DIAGNOSIS — E785 Hyperlipidemia, unspecified: Secondary | ICD-10-CM | POA: Diagnosis present

## 2014-10-17 DIAGNOSIS — J9601 Acute respiratory failure with hypoxia: Secondary | ICD-10-CM | POA: Diagnosis present

## 2014-10-17 DIAGNOSIS — R4182 Altered mental status, unspecified: Secondary | ICD-10-CM | POA: Diagnosis present

## 2014-10-17 DIAGNOSIS — I5033 Acute on chronic diastolic (congestive) heart failure: Secondary | ICD-10-CM | POA: Diagnosis present

## 2014-10-17 DIAGNOSIS — R031 Nonspecific low blood-pressure reading: Secondary | ICD-10-CM | POA: Diagnosis present

## 2014-10-17 DIAGNOSIS — G4733 Obstructive sleep apnea (adult) (pediatric): Secondary | ICD-10-CM | POA: Diagnosis present

## 2014-10-17 DIAGNOSIS — J189 Pneumonia, unspecified organism: Secondary | ICD-10-CM | POA: Diagnosis present

## 2014-10-17 DIAGNOSIS — R0602 Shortness of breath: Secondary | ICD-10-CM

## 2014-10-17 DIAGNOSIS — G894 Chronic pain syndrome: Secondary | ICD-10-CM | POA: Diagnosis present

## 2014-10-17 DIAGNOSIS — E662 Morbid (severe) obesity with alveolar hypoventilation: Secondary | ICD-10-CM | POA: Diagnosis present

## 2014-10-17 DIAGNOSIS — I959 Hypotension, unspecified: Secondary | ICD-10-CM | POA: Diagnosis present

## 2014-10-17 DIAGNOSIS — K219 Gastro-esophageal reflux disease without esophagitis: Secondary | ICD-10-CM | POA: Diagnosis present

## 2014-10-17 DIAGNOSIS — J45909 Unspecified asthma, uncomplicated: Secondary | ICD-10-CM | POA: Diagnosis present

## 2014-10-17 DIAGNOSIS — J449 Chronic obstructive pulmonary disease, unspecified: Secondary | ICD-10-CM | POA: Diagnosis present

## 2014-10-17 DIAGNOSIS — D649 Anemia, unspecified: Secondary | ICD-10-CM | POA: Diagnosis present

## 2014-10-17 DIAGNOSIS — I10 Essential (primary) hypertension: Secondary | ICD-10-CM | POA: Diagnosis present

## 2014-10-17 DIAGNOSIS — M549 Dorsalgia, unspecified: Secondary | ICD-10-CM | POA: Diagnosis present

## 2014-10-17 DIAGNOSIS — E119 Type 2 diabetes mellitus without complications: Secondary | ICD-10-CM

## 2014-10-17 DIAGNOSIS — F419 Anxiety disorder, unspecified: Secondary | ICD-10-CM | POA: Diagnosis present

## 2014-10-17 DIAGNOSIS — R740 Nonspecific elevation of levels of transaminase and lactic acid dehydrogenase [LDH]: Secondary | ICD-10-CM | POA: Diagnosis present

## 2014-10-17 DIAGNOSIS — F1120 Opioid dependence, uncomplicated: Secondary | ICD-10-CM | POA: Diagnosis present

## 2014-10-17 DIAGNOSIS — I272 Other secondary pulmonary hypertension: Secondary | ICD-10-CM | POA: Diagnosis present

## 2014-10-17 DIAGNOSIS — Z6836 Body mass index (BMI) 36.0-36.9, adult: Secondary | ICD-10-CM | POA: Diagnosis not present

## 2014-10-17 DIAGNOSIS — A419 Sepsis, unspecified organism: Secondary | ICD-10-CM | POA: Diagnosis present

## 2014-10-17 DIAGNOSIS — F17210 Nicotine dependence, cigarettes, uncomplicated: Secondary | ICD-10-CM | POA: Diagnosis present

## 2014-10-17 DIAGNOSIS — K761 Chronic passive congestion of liver: Secondary | ICD-10-CM | POA: Diagnosis present

## 2014-10-17 DIAGNOSIS — Z91041 Radiographic dye allergy status: Secondary | ICD-10-CM | POA: Diagnosis not present

## 2014-10-17 DIAGNOSIS — I369 Nonrheumatic tricuspid valve disorder, unspecified: Secondary | ICD-10-CM

## 2014-10-17 DIAGNOSIS — G9340 Encephalopathy, unspecified: Secondary | ICD-10-CM | POA: Diagnosis present

## 2014-10-17 DIAGNOSIS — J9602 Acute respiratory failure with hypercapnia: Secondary | ICD-10-CM | POA: Diagnosis present

## 2014-10-17 DIAGNOSIS — N179 Acute kidney failure, unspecified: Secondary | ICD-10-CM | POA: Diagnosis present

## 2014-10-17 DIAGNOSIS — E876 Hypokalemia: Secondary | ICD-10-CM | POA: Diagnosis present

## 2014-10-17 DIAGNOSIS — R609 Edema, unspecified: Secondary | ICD-10-CM

## 2014-10-17 DIAGNOSIS — Y95 Nosocomial condition: Secondary | ICD-10-CM | POA: Diagnosis present

## 2014-10-17 LAB — CBC WITH DIFFERENTIAL/PLATELET
BASOS ABS: 0 10*3/uL (ref 0.0–0.1)
BASOS PCT: 0 % (ref 0–1)
EOS PCT: 1 % (ref 0–5)
Eosinophils Absolute: 0.1 10*3/uL (ref 0.0–0.7)
HCT: 35.9 % — ABNORMAL LOW (ref 36.0–46.0)
Hemoglobin: 10.8 g/dL — ABNORMAL LOW (ref 12.0–15.0)
LYMPHS PCT: 22 % (ref 12–46)
Lymphs Abs: 2.6 10*3/uL (ref 0.7–4.0)
MCH: 26.1 pg (ref 26.0–34.0)
MCHC: 30.1 g/dL (ref 30.0–36.0)
MCV: 86.7 fL (ref 78.0–100.0)
Monocytes Absolute: 1.2 10*3/uL — ABNORMAL HIGH (ref 0.1–1.0)
Monocytes Relative: 10 % (ref 3–12)
Neutro Abs: 7.7 10*3/uL (ref 1.7–7.7)
Neutrophils Relative %: 67 % (ref 43–77)
PLATELETS: 373 10*3/uL (ref 150–400)
RBC: 4.14 MIL/uL (ref 3.87–5.11)
RDW: 15.8 % — AB (ref 11.5–15.5)
WBC: 11.5 10*3/uL — ABNORMAL HIGH (ref 4.0–10.5)

## 2014-10-17 LAB — LACTIC ACID, PLASMA: Lactic Acid, Venous: 1.2 mmol/L (ref 0.5–2.2)

## 2014-10-17 LAB — I-STAT ARTERIAL BLOOD GAS, ED
Acid-Base Excess: 4 mmol/L — ABNORMAL HIGH (ref 0.0–2.0)
Bicarbonate: 31.6 mEq/L — ABNORMAL HIGH (ref 20.0–24.0)
O2 Saturation: 89 %
TCO2: 33 mmol/L (ref 0–100)
pCO2 arterial: 59 mmHg (ref 35.0–45.0)
pH, Arterial: 7.336 — ABNORMAL LOW (ref 7.350–7.450)
pO2, Arterial: 62 mmHg — ABNORMAL LOW (ref 80.0–100.0)

## 2014-10-17 LAB — I-STAT TROPONIN, ED: Troponin i, poc: 0.05 ng/mL (ref 0.00–0.08)

## 2014-10-17 LAB — TROPONIN I
Troponin I: <0.3 ng/mL (ref <0.30)
Troponin I: <0.3 ng/mL (ref <0.30)

## 2014-10-17 LAB — COMPREHENSIVE METABOLIC PANEL
ALT: 12 U/L (ref 0–35)
AST: 11 U/L (ref 0–37)
Albumin: 3.4 g/dL — ABNORMAL LOW (ref 3.5–5.2)
Alkaline Phosphatase: 153 U/L — ABNORMAL HIGH (ref 39–117)
Anion gap: 16 — ABNORMAL HIGH (ref 5–15)
BUN: 13 mg/dL (ref 6–23)
CO2: 26 meq/L (ref 19–32)
Calcium: 8.9 mg/dL (ref 8.4–10.5)
Chloride: 99 mEq/L (ref 96–112)
Creatinine, Ser: 1 mg/dL (ref 0.50–1.10)
GFR calc Af Amer: 71 mL/min — ABNORMAL LOW (ref >90)
GFR, EST NON AFRICAN AMERICAN: 61 mL/min — AB (ref >90)
Glucose, Bld: 114 mg/dL — ABNORMAL HIGH (ref 70–99)
Potassium: 4.1 mEq/L (ref 3.7–5.3)
SODIUM: 141 meq/L (ref 137–147)
Total Bilirubin: 0.6 mg/dL (ref 0.3–1.2)
Total Protein: 8 g/dL (ref 6.0–8.3)

## 2014-10-17 LAB — STREP PNEUMONIAE URINARY ANTIGEN: STREP PNEUMO URINARY ANTIGEN: NEGATIVE

## 2014-10-17 LAB — D-DIMER, QUANTITATIVE (NOT AT ARMC): D DIMER QUANT: 0.9 ug{FEU}/mL — AB (ref 0.00–0.48)

## 2014-10-17 LAB — PROTIME-INR
INR: 0.97 (ref 0.00–1.49)
PROTHROMBIN TIME: 12.9 s (ref 11.6–15.2)

## 2014-10-17 LAB — MAGNESIUM: Magnesium: 2.1 mg/dL (ref 1.5–2.5)

## 2014-10-17 LAB — INFLUENZA PANEL BY PCR (TYPE A & B)
H1N1 flu by pcr: NOT DETECTED
Influenza A By PCR: NEGATIVE
Influenza B By PCR: NEGATIVE

## 2014-10-17 LAB — HIV ANTIBODY (ROUTINE TESTING W REFLEX): HIV 1&2 Ab, 4th Generation: NONREACTIVE

## 2014-10-17 LAB — TSH: TSH: 0.569 u[IU]/mL (ref 0.350–4.500)

## 2014-10-17 LAB — PRO B NATRIURETIC PEPTIDE: Pro B Natriuretic peptide (BNP): 1221 pg/mL — ABNORMAL HIGH (ref 0–125)

## 2014-10-17 LAB — PROCALCITONIN: PROCALCITONIN: 0.12 ng/mL

## 2014-10-17 LAB — HEMOGLOBIN A1C
Hgb A1c MFr Bld: 6.8 % — ABNORMAL HIGH (ref <5.7)
MEAN PLASMA GLUCOSE: 148 mg/dL — AB (ref <117)

## 2014-10-17 LAB — MRSA PCR SCREENING: MRSA BY PCR: NEGATIVE

## 2014-10-17 LAB — GLUCOSE, CAPILLARY: Glucose-Capillary: 158 mg/dL — ABNORMAL HIGH (ref 70–99)

## 2014-10-17 MED ORDER — GUAIFENESIN ER 600 MG PO TB12
600.0000 mg | ORAL_TABLET | Freq: Two times a day (BID) | ORAL | Status: DC
  Administered 2014-10-17 – 2014-10-24 (×14): 600 mg via ORAL
  Filled 2014-10-17 (×18): qty 1

## 2014-10-17 MED ORDER — GABAPENTIN 300 MG PO CAPS
300.0000 mg | ORAL_CAPSULE | Freq: Three times a day (TID) | ORAL | Status: DC
  Administered 2014-10-17 – 2014-10-24 (×19): 300 mg via ORAL
  Filled 2014-10-17 (×23): qty 1

## 2014-10-17 MED ORDER — DOCUSATE SODIUM 100 MG PO CAPS
100.0000 mg | ORAL_CAPSULE | Freq: Every day | ORAL | Status: DC | PRN
Start: 2014-10-17 — End: 2014-10-24
  Filled 2014-10-17: qty 1

## 2014-10-17 MED ORDER — GABAPENTIN 300 MG PO CAPS
600.0000 mg | ORAL_CAPSULE | Freq: Three times a day (TID) | ORAL | Status: DC
  Filled 2014-10-17 (×2): qty 2

## 2014-10-17 MED ORDER — ALBUTEROL SULFATE (2.5 MG/3ML) 0.083% IN NEBU
2.5000 mg | INHALATION_SOLUTION | RESPIRATORY_TRACT | Status: DC | PRN
  Administered 2014-10-22: 2.5 mg via RESPIRATORY_TRACT
  Filled 2014-10-17: qty 3

## 2014-10-17 MED ORDER — PIPERACILLIN-TAZOBACTAM 3.375 G IVPB
3.3750 g | Freq: Three times a day (TID) | INTRAVENOUS | Status: DC
  Administered 2014-10-18 (×3): 3.375 g via INTRAVENOUS
  Filled 2014-10-17 (×4): qty 50

## 2014-10-17 MED ORDER — GABAPENTIN 300 MG PO CAPS: 900.0000 mg | ORAL_CAPSULE | Freq: Three times a day (TID) | ORAL | Status: DC

## 2014-10-17 MED ORDER — ACETAMINOPHEN 325 MG PO TABS
650.0000 mg | ORAL_TABLET | ORAL | Status: DC | PRN
  Administered 2014-10-17 (×2): 650 mg via ORAL
  Filled 2014-10-17 (×2): qty 2

## 2014-10-17 MED ORDER — PIPERACILLIN-TAZOBACTAM 3.375 G IVPB 30 MIN
3.3750 g | Freq: Once | INTRAVENOUS | Status: AC
  Administered 2014-10-17: 3.375 g via INTRAVENOUS
  Filled 2014-10-17: qty 50

## 2014-10-17 MED ORDER — GABAPENTIN 600 MG PO TABS
600.0000 mg | ORAL_TABLET | Freq: Three times a day (TID) | ORAL | Status: DC
  Administered 2014-10-17: 600 mg via ORAL
  Filled 2014-10-17 (×2): qty 1

## 2014-10-17 MED ORDER — OSELTAMIVIR PHOSPHATE 75 MG PO CAPS
75.0000 mg | ORAL_CAPSULE | Freq: Two times a day (BID) | ORAL | Status: DC
  Administered 2014-10-17: 75 mg via ORAL
  Filled 2014-10-17 (×2): qty 1

## 2014-10-17 MED ORDER — VANCOMYCIN HCL 10 G IV SOLR
1250.0000 mg | Freq: Two times a day (BID) | INTRAVENOUS | Status: DC
  Filled 2014-10-17 (×2): qty 1250

## 2014-10-17 MED ORDER — OXYCODONE HCL 5 MG PO TABS
10.0000 mg | ORAL_TABLET | Freq: Four times a day (QID) | ORAL | Status: DC | PRN
  Administered 2014-10-17: 10 mg via ORAL
  Filled 2014-10-17 (×2): qty 2

## 2014-10-17 MED ORDER — PANTOPRAZOLE SODIUM 40 MG PO TBEC
40.0000 mg | DELAYED_RELEASE_TABLET | Freq: Every day | ORAL | Status: DC
  Administered 2014-10-17 – 2014-10-24 (×8): 40 mg via ORAL
  Filled 2014-10-17 (×9): qty 1

## 2014-10-17 MED ORDER — ATORVASTATIN CALCIUM 10 MG PO TABS
10.0000 mg | ORAL_TABLET | Freq: Every day | ORAL | Status: DC
  Administered 2014-10-17 – 2014-10-24 (×8): 10 mg via ORAL
  Filled 2014-10-17 (×10): qty 1

## 2014-10-17 MED ORDER — SODIUM CHLORIDE 0.9 % IJ SOLN: 3.0000 mL | Freq: Two times a day (BID) | INTRAMUSCULAR | Status: DC

## 2014-10-17 MED ORDER — OXYCODONE HCL ER 40 MG PO T12A
40.0000 mg | EXTENDED_RELEASE_TABLET | Freq: Two times a day (BID) | ORAL | Status: DC
  Administered 2014-10-17 – 2014-10-21 (×9): 40 mg via ORAL
  Filled 2014-10-17 (×8): qty 1
  Filled 2014-10-17: qty 4
  Filled 2014-10-17: qty 1

## 2014-10-17 MED ORDER — ONDANSETRON HCL 4 MG PO TABS
4.0000 mg | ORAL_TABLET | Freq: Four times a day (QID) | ORAL | Status: DC
  Administered 2014-10-17 – 2014-10-19 (×6): 4 mg via ORAL
  Filled 2014-10-17 (×12): qty 1

## 2014-10-17 MED ORDER — GABAPENTIN 600 MG PO TABS
300.0000 mg | ORAL_TABLET | Freq: Three times a day (TID) | ORAL | Status: DC
  Filled 2014-10-17: qty 0.5

## 2014-10-17 MED ORDER — PIPERACILLIN-TAZOBACTAM 3.375 G IVPB
3.3750 g | Freq: Three times a day (TID) | INTRAVENOUS | Status: DC
  Filled 2014-10-17 (×2): qty 50

## 2014-10-17 MED ORDER — FUROSEMIDE 10 MG/ML IJ SOLN
20.0000 mg | Freq: Two times a day (BID) | INTRAMUSCULAR | Status: DC
  Administered 2014-10-17 – 2014-10-22 (×10): 20 mg via INTRAVENOUS
  Filled 2014-10-17 (×16): qty 2

## 2014-10-17 MED ORDER — PANTOPRAZOLE SODIUM 40 MG PO TBEC: 40.0000 mg | DELAYED_RELEASE_TABLET | Freq: Every day | ORAL | Status: DC

## 2014-10-17 MED ORDER — VANCOMYCIN HCL 10 G IV SOLR
1500.0000 mg | Freq: Once | INTRAVENOUS | Status: AC
  Administered 2014-10-17: 1500 mg via INTRAVENOUS
  Filled 2014-10-17: qty 1500

## 2014-10-17 MED ORDER — ONDANSETRON HCL 4 MG/2ML IJ SOLN
4.0000 mg | Freq: Four times a day (QID) | INTRAMUSCULAR | Status: DC | PRN
  Administered 2014-10-22: 03:00:00 4 mg via INTRAVENOUS
  Filled 2014-10-17: qty 2

## 2014-10-17 MED ORDER — ALPRAZOLAM 0.5 MG PO TABS
1.0000 mg | ORAL_TABLET | Freq: Two times a day (BID) | ORAL | Status: DC | PRN
  Administered 2014-10-17 (×2): 0.5 mg via ORAL
  Administered 2014-10-18: 1 mg via ORAL
  Filled 2014-10-17 (×2): qty 2

## 2014-10-17 MED ORDER — SODIUM CHLORIDE 0.9 % IV SOLN
250.0000 mL | INTRAVENOUS | Status: DC | PRN
Start: 2014-10-17 — End: 2014-10-17

## 2014-10-17 MED ORDER — SODIUM CHLORIDE 0.9 % IJ SOLN
3.0000 mL | INTRAMUSCULAR | Status: DC | PRN
Start: 2014-10-17 — End: 2014-10-17

## 2014-10-17 MED ORDER — TIOTROPIUM BROMIDE MONOHYDRATE 18 MCG IN CAPS
18.0000 ug | ORAL_CAPSULE | Freq: Every day | RESPIRATORY_TRACT | Status: DC
  Administered 2014-10-17 – 2014-10-23 (×7): 18 ug via RESPIRATORY_TRACT
  Filled 2014-10-17 (×2): qty 5

## 2014-10-17 MED ORDER — MOMETASONE FURO-FORMOTEROL FUM 100-5 MCG/ACT IN AERO
2.0000 | INHALATION_SPRAY | Freq: Two times a day (BID) | RESPIRATORY_TRACT | Status: DC
  Administered 2014-10-17 – 2014-10-20 (×7): 2 via RESPIRATORY_TRACT
  Filled 2014-10-17 (×2): qty 8.8

## 2014-10-17 NOTE — ED Notes (Signed)
EKG given to Dr. Claudine Mouton

## 2014-10-17 NOTE — Progress Notes (Signed)
Patient refuses to wear cpap. Patient does not wear her cpap at home. Patient has a difficult time tolerating the O2 mask she is currently using.

## 2014-10-17 NOTE — ED Notes (Signed)
Dr. Mingo Amber allows patient to eat at this time. Provided meal, and placed patient on 8L nasal cannula for support while eating. O2 sats reading 92% on 8L when changed support device.

## 2014-10-17 NOTE — Progress Notes (Addendum)
[place] TEAM 1 - Stepdown/ICU TEAM Progress Note  Teresa Wiley DXA:[id number] DOB: 1956/10/31 DOA: 10/16/2014 PCP: Ellender Hose, MD  Admit HPI / Brief Narrative: 58 yo female w/ a history of Asthma; Hypertension; Degenerative disc disease w/ chronic back pain; Reflux; Diabetes mellitus; and morbid obesity who presented with shortness of breath and confusion. On arrival patient was noted to be hypoxic to 40%. CXR showed evidence of fluid overload and possible infiltrate. Patient was noted to be slightly hypotensive 90/60. WBC elevated to 11.9 with low grade fever up to 99.6.   HPI/Subjective: Pt seen for f/u visit.   Assessment/Plan:  Acute encephalopathy likely from combined hypoxic and hypercarbic resp failure - CT head negative  Hypoxic hypercarbic acute respiratory failure  obesity, possible COPD (smoker), untreated OSA, diastolic HF - PCCM consulted   Elevated d-dimer Pt at high risk for PE given obesity - this could also explain her hemoptysis - CT angio of chest would be ideal, but pt has reported hives w/ iodine/shellfish exposure - dopplers were negative for DVT, making PE less likely but not ruled out - will consider pretreat w/ steroid/benadryl and CT angio in AM   ?LLL HCAP F/u CXR makes this appear less likely  Acute on chronic diastolic CHF   Transient low grade hemoptysis  SCDs only for DVT prophy  Tobacco abuse   Hypotension  Resolved  HTN  DDD w/ chronic back pain   DM  Morbid obesity - Body mass index is 39.51 kg/(m^2).  Code Status: FULL Family Communication: no family present at time of exam Disposition Plan: SDU  Consultants: none  Procedures: TTE - 11/23 - EF 60% - no WMA - PA pressure 70mm Hg B Venous duplex - 11/23 - no DVT or SVT  Antibiotics: Zosyn 10/17/14 >  DVT prophylaxis: SCDs  Objective: Blood pressure 112/63, pulse 96, temperature 98.7 F (37.1 C), temperature source Oral, resp. rate 16, height 5\' 9"  (1.753 m),  weight 121.4 kg (267 lb 10.2 oz), SpO2 91 %.  Intake/Output Summary (Last 24 hours) at 10/17/14 1514 Last data filed at 10/17/14 2094  Gross per 24 hour  Intake     50 ml  Output    400 ml  Net   -350 ml   Exam: F/U exam completed   Data Reviewed: Basic Metabolic Panel:  Recent Labs Lab 10/16/14 2103 10/17/14 0540  NA 141 141  K 4.5 4.1  CL 102 99  CO2 25 26  GLUCOSE 128* 114*  BUN 14 13  CREATININE 1.11* 1.00  CALCIUM 8.7 8.9  MG  --  2.1    Liver Function Tests:  Recent Labs Lab 10/16/14 2103 10/17/14 0540  AST 12 11  ALT 12 12  ALKPHOS 154* 153*  BILITOT 0.7 0.6  PROT 7.7 8.0  ALBUMIN 3.1* 3.4*   Coags:  Recent Labs Lab 10/16/14 2103 10/17/14 0540  INR 1.01 0.97   CBC:  Recent Labs Lab 10/16/14 2103 10/17/14 0540  WBC 11.9* 11.5*  NEUTROABS  --  7.7  HGB 10.5* 10.8*  HCT 36.3 35.9*  MCV 87.3 86.7  PLT 381 373    Cardiac Enzymes:  Recent Labs Lab 10/17/14 0540 10/17/14 0654  TROPONINI <0.30 <0.30   BNP (last 3 results)  Recent Labs  10/16/14 2245 10/17/14 0540  PROBNP 1935.0* 1221.0*    CBG:  Recent Labs Lab 10/16/14 2105  GLUCAP 129*    No results found for this or any previous visit (from the past 240 hour(s)).  Studies:  Recent x-ray studies have been reviewed in detail by the Attending Physician  Scheduled Meds:  Scheduled Meds: . atorvastatin  10 mg Oral Daily  . furosemide  20 mg Intravenous BID  . gabapentin  600 mg Oral TID  . guaiFENesin  600 mg Oral BID  . mometasone-formoterol  2 puff Inhalation BID  . ondansetron  4 mg Oral Q6H  . oseltamivir  75 mg Oral BID  . OxyCODONE  40 mg Oral Q12H  . pantoprazole  40 mg Oral Daily  . sodium chloride  3 mL Intravenous Q12H  . tiotropium  18 mcg Inhalation Daily  . vancomycin  1,250 mg Intravenous Q12H    Time spent on care of this patient: 25+ mins   MCCLUNG,JEFFREY T , MD   [facility] Office  [phone] Pager - Text Page per Shea Evans as  per below:  On-Call/Text Page:      [email or web address]      password [email or web address]  If 7PM-7AM, please contact night-coverage [email or web address] Password [email or web address] 10/17/2014, 3:14 PM   LOS: 1 day

## 2014-10-17 NOTE — Progress Notes (Signed)
VASCULAR LAB PRELIMINARY  PRELIMINARY  PRELIMINARY  PRELIMINARY  Bilateral lower extremity venous Dopplers completed.    Preliminary report:  There is no DVT or SVT noted in the bilateral lower extremities.   Nyara Capell, RVT 10/17/2014, 8:03 AM

## 2014-10-17 NOTE — ED Notes (Signed)
Phlebotomy is at the bedside.

## 2014-10-17 NOTE — ED Notes (Signed)
Cbc, troponin, pt, tsh are collected per lab. Not enough sample for other tests ordered.

## 2014-10-17 NOTE — ED Notes (Signed)
Oxygen was not on pt. Tech placed oxygen back on pt.

## 2014-10-17 NOTE — Progress Notes (Signed)
Pt. Refuses CPAP at this time. Pt. States that she had a sleep study done & was told to wear O2 at night & that she doesn't wear CPAP at home. Pt. Is aware to let RT know if she changes her mind & decides to wear CPAP.
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
## 2014-10-17 NOTE — Progress Notes (Signed)
  Echocardiogram 2D Echocardiogram has been performed.  Ares Tegtmeyer FRANCES 10/17/2014, 8:51 AM
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
## 2014-10-17 NOTE — ED Notes (Signed)
Critical care at the bedside. 

## 2014-10-17 NOTE — Progress Notes (Signed)
ANTIBIOTIC CONSULT NOTE - FOLLOW UP  Pharmacy Consult for zosyn Indication: r/o PNA  Allergies  Allergen Reactions  . Iodine Anaphylaxis and Hives  . Shellfish Allergy Anaphylaxis and Hives  . Erythromycin Nausea And Vomiting    Patient Measurements: Height: 5\' 9"  (175.3 cm) Weight: 267 lb 10.2 oz (121.4 kg) IBW/kg (Calculated) : 66.2  Vital Signs: Temp: 98.1 F (36.7 C) (11/23 1729) Temp Source: Oral (11/23 1729) BP: 112/63 mmHg (11/23 1135) Pulse Rate: 96 (11/23 1135) Intake/Output from previous day: 11/22 0701 - 11/23 0700 In: 50 [I.V.:50] Out: 400 [Urine:400] Intake/Output from this shift:    Labs:  Recent Labs  10/16/14 2103 10/17/14 0540  WBC 11.9* 11.5*  HGB 10.5* 10.8*  PLT 381 373  CREATININE 1.11* 1.00   Estimated Creatinine Clearance: 86.5 mL/min (by C-G formula based on Cr of 1). No results for input(s): VANCOTROUGH, VANCOPEAK, VANCORANDOM, GENTTROUGH, GENTPEAK, GENTRANDOM, TOBRATROUGH, TOBRAPEAK, TOBRARND, AMIKACINPEAK, AMIKACINTROU, AMIKACIN in the last 72 hours.   Microbiology: Recent Results (from the past 720 hour(s))  MRSA PCR Screening     Status: None   Collection Time: 10/17/14  2:41 PM  Result Value Ref Range Status   MRSA by PCR NEGATIVE NEGATIVE Final    Comment:        The GeneXpert MRSA Assay (FDA approved for NASAL specimens only), is one component of a comprehensive MRSA colonization surveillance program. It is not intended to diagnose MRSA infection nor to guide or monitor treatment for MRSA infections.     Anti-infectives    Start     Dose/Rate Route Frequency Ordered Stop   10/17/14 1830  piperacillin-tazobactam (ZOSYN) IVPB 3.375 g     3.375 g12.5 mL/hr over 240 Minutes Intravenous Every 8 hours 10/17/14 1817     10/17/14 1600  vancomycin (VANCOCIN) 1,250 mg in sodium chloride 0.9 % 250 mL IVPB  Status:  Discontinued     1,250 mg166.7 mL/hr over 90 Minutes Intravenous Every 12 hours 10/17/14 0529 10/17/14 1757   10/17/14 1400  oseltamivir (TAMIFLU) capsule 75 mg  Status:  Discontinued     75 mg Oral 2 times daily 10/17/14 1343 10/17/14 1800   10/17/14 0530  vancomycin (VANCOCIN) 1,500 mg in sodium chloride 0.9 % 500 mL IVPB     1,500 mg250 mL/hr over 120 Minutes Intravenous  Once 10/17/14 0528 10/17/14 0813   10/17/14 0200  piperacillin-tazobactam (ZOSYN) IVPB 3.375 g     3.375 g100 mL/hr over 30 Minutes Intravenous  Once 10/17/14 0151 10/17/14 0609   10/16/14 2230  vancomycin (VANCOCIN) IVPB 1000 mg/200 mL premix     1,000 mg200 mL/hr over 60 Minutes Intravenous  Once 10/16/14 2223 10/17/14 0018   10/16/14 2230  piperacillin-tazobactam (ZOSYN) IVPB 3.375 g     3.375 g100 mL/hr over 30 Minutes Intravenous  Once 10/16/14 2223 10/16/14 2315      Assessment: 58 yo female with possible HCAP to continue zosyn (vancomycin d/c). WBC= 11.5, SCr= 1.0 and CrCL ~ 85.   11/23 zosyn>> 11/23 vanc>> 11/23  11/22 blood x2   Plan:  -Zosyn 3.375gm IV q8h -Will follow renal function, cultures and clinical progress  Hildred Laser, Pharm D 10/17/2014 6:24 PM

## 2014-10-17 NOTE — ED Notes (Signed)
Spoke with Dr. Roel Cluck in regards to patient's respiratory status.  ABG was obtained after patient was wearing non-rebreather. When results were available, patient was placed on venturi mask.  She acknowledges, and will order ABG for current reading.

## 2014-10-17 NOTE — ED Notes (Signed)
Pt on 10L Nonrebreather
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
## 2014-10-17 NOTE — ED Notes (Signed)
Meds requested from pharmacy.

## 2014-10-17 NOTE — ED Notes (Addendum)
Called main pharmacy, patient received zosyn last night approximately 2245, and has another dose scheduled, pharmacist advised to administer next dose at 0600 this morning.

## 2014-10-17 NOTE — ED Notes (Signed)
Called Crystal in phlebotomy for redraw of missing labs.

## 2014-10-17 NOTE — ED Notes (Signed)
Phlebotomy at the bedside  

## 2014-10-17 NOTE — ED Notes (Signed)
Pt placed on monitor. Pt monitored by blood pressure, pulse ox, and 5 lead. Found pts O2 to be low. Retrieved RN, Ethan for assistance pt placed on 4.5L NRB

## 2014-10-17 NOTE — ED Notes (Signed)
Called nutrition to have breakfast tray ordered, diabetic diet, patient requests no pork. Doris acknowledges.

## 2014-10-18 DIAGNOSIS — Z720 Tobacco use: Secondary | ICD-10-CM

## 2014-10-18 DIAGNOSIS — G9340 Encephalopathy, unspecified: Secondary | ICD-10-CM

## 2014-10-18 DIAGNOSIS — I5033 Acute on chronic diastolic (congestive) heart failure: Principal | ICD-10-CM

## 2014-10-18 DIAGNOSIS — F1920 Other psychoactive substance dependence, uncomplicated: Secondary | ICD-10-CM

## 2014-10-18 DIAGNOSIS — R0902 Hypoxemia: Secondary | ICD-10-CM | POA: Diagnosis present

## 2014-10-18 DIAGNOSIS — I272 Pulmonary hypertension, unspecified: Secondary | ICD-10-CM

## 2014-10-18 DIAGNOSIS — F1120 Opioid dependence, uncomplicated: Secondary | ICD-10-CM | POA: Diagnosis present

## 2014-10-18 DIAGNOSIS — I10 Essential (primary) hypertension: Secondary | ICD-10-CM

## 2014-10-18 DIAGNOSIS — J189 Pneumonia, unspecified organism: Secondary | ICD-10-CM

## 2014-10-18 DIAGNOSIS — R0689 Other abnormalities of breathing: Secondary | ICD-10-CM | POA: Diagnosis present

## 2014-10-18 DIAGNOSIS — R042 Hemoptysis: Secondary | ICD-10-CM

## 2014-10-18 DIAGNOSIS — E119 Type 2 diabetes mellitus without complications: Secondary | ICD-10-CM

## 2014-10-18 DIAGNOSIS — G894 Chronic pain syndrome: Secondary | ICD-10-CM

## 2014-10-18 DIAGNOSIS — I950 Idiopathic hypotension: Secondary | ICD-10-CM

## 2014-10-18 LAB — CBC
HEMATOCRIT: 35.7 % — AB (ref 36.0–46.0)
Hemoglobin: 10.3 g/dL — ABNORMAL LOW (ref 12.0–15.0)
MCH: 25.7 pg — ABNORMAL LOW (ref 26.0–34.0)
MCHC: 28.9 g/dL — ABNORMAL LOW (ref 30.0–36.0)
MCV: 89 fL (ref 78.0–100.0)
Platelets: 356 10*3/uL (ref 150–400)
RBC: 4.01 MIL/uL (ref 3.87–5.11)
RDW: 15.6 % — AB (ref 11.5–15.5)
WBC: 9 10*3/uL (ref 4.0–10.5)

## 2014-10-18 LAB — COMPREHENSIVE METABOLIC PANEL
ALBUMIN: 3.2 g/dL — AB (ref 3.5–5.2)
ALT: 12 U/L (ref 0–35)
ANION GAP: 13 (ref 5–15)
AST: 12 U/L (ref 0–37)
Alkaline Phosphatase: 149 U/L — ABNORMAL HIGH (ref 39–117)
BUN: 9 mg/dL (ref 6–23)
CALCIUM: 8.9 mg/dL (ref 8.4–10.5)
CO2: 31 mEq/L (ref 19–32)
Chloride: 97 mEq/L (ref 96–112)
Creatinine, Ser: 0.81 mg/dL (ref 0.50–1.10)
GFR calc Af Amer: >90 mL/min (ref >90)
GFR calc non Af Amer: 79 mL/min — ABNORMAL LOW (ref >90)
Glucose, Bld: 111 mg/dL — ABNORMAL HIGH (ref 70–99)
Potassium: 3.8 mEq/L (ref 3.7–5.3)
Sodium: 141 mEq/L (ref 137–147)
Total Bilirubin: 0.7 mg/dL (ref 0.3–1.2)
Total Protein: 7.7 g/dL (ref 6.0–8.3)

## 2014-10-18 LAB — GLUCOSE, CAPILLARY
GLUCOSE-CAPILLARY: 116 mg/dL — AB (ref 70–99)
GLUCOSE-CAPILLARY: 129 mg/dL — AB (ref 70–99)
Glucose-Capillary: 129 mg/dL — ABNORMAL HIGH (ref 70–99)
Glucose-Capillary: 126 mg/dL — ABNORMAL HIGH (ref 70–99)

## 2014-10-18 MED ORDER — LORAZEPAM 1 MG PO TABS
1.0000 mg | ORAL_TABLET | Freq: Two times a day (BID) | ORAL | Status: DC
  Administered 2014-10-18 – 2014-10-24 (×13): 1 mg via ORAL
  Filled 2014-10-18: qty 1
  Filled 2014-10-18 (×3): qty 2
  Filled 2014-10-18 (×2): qty 1
  Filled 2014-10-18 (×2): qty 2
  Filled 2014-10-18: qty 1
  Filled 2014-10-18 (×2): qty 2
  Filled 2014-10-18 (×2): qty 1

## 2014-10-18 MED ORDER — PREDNISONE 50 MG PO TABS
50.0000 mg | ORAL_TABLET | Freq: Every day | ORAL | Status: DC
Start: 2014-10-19 — End: 2014-10-18

## 2014-10-18 MED ORDER — PREDNISONE 50 MG PO TABS
50.0000 mg | ORAL_TABLET | Freq: Once | ORAL | Status: AC
  Administered 2014-10-19: 50 mg via ORAL
  Filled 2014-10-18: qty 1

## 2014-10-18 MED ORDER — PREDNISONE 50 MG PO TABS
50.0000 mg | ORAL_TABLET | Freq: Once | ORAL | Status: AC
  Administered 2014-10-18: 50 mg via ORAL
  Filled 2014-10-18: qty 1

## 2014-10-18 MED ORDER — OXYCODONE HCL 5 MG PO TABS
5.0000 mg | ORAL_TABLET | Freq: Four times a day (QID) | ORAL | Status: DC | PRN
  Administered 2014-10-19 (×2): 5 mg via ORAL
  Filled 2014-10-18 (×4): qty 1

## 2014-10-18 MED ORDER — DIPHENHYDRAMINE HCL 50 MG/ML IJ SOLN
50.0000 mg | Freq: Once | INTRAMUSCULAR | Status: AC
  Administered 2014-10-19: 50 mg via INTRAVENOUS
  Filled 2014-10-18: qty 1

## 2014-10-18 MED ORDER — SODIUM CHLORIDE 0.9 % IJ SOLN
10.0000 mL | INTRAMUSCULAR | Status: DC | PRN
  Administered 2014-10-22 (×2): 10 mL
  Administered 2014-10-23 – 2014-10-24 (×3): 20 mL
  Filled 2014-10-18 (×5): qty 40

## 2014-10-18 MED ORDER — INSULIN ASPART 100 UNIT/ML ~~LOC~~ SOLN
0.0000 [IU] | SUBCUTANEOUS | Status: DC
  Administered 2014-10-18 (×2): 2 [IU] via SUBCUTANEOUS
  Administered 2014-10-19 (×2): 3 [IU] via SUBCUTANEOUS
  Administered 2014-10-19: 2 [IU] via SUBCUTANEOUS
  Administered 2014-10-19: 3 [IU] via SUBCUTANEOUS

## 2014-10-18 NOTE — Progress Notes (Signed)
Patient still refusing CPAP at this time.

## 2014-10-18 NOTE — Progress Notes (Signed)
Chaplain responded to page for help with Advanced Directive. Pt wants her wife, Teresa Wiley, to be healthcare power of attorney. Chaplain explained Advanced Directive and dropped one off for completion. Chaplain asked family to page chaplain back once completed. Pt very teary and worried about her condition. Pt wife at bedside trying to calm her down. Pt wife eager to complete before medical decisions made. Pt and family Islamic, chaplain brought [place] for family and offered prayer. Page chaplain as needed.   10/18/14 1200  Clinical Encounter Type  Visited With Patient and family together  Visit Type Initial;Spiritual support  Referral From Nurse  Consult/Referral To Chaplain  Recommendations Follow Up  Spiritual Encounters  Spiritual Needs Emotional;Prayer;Sacred text  Stress Factors  Patient Stress Factors Health changes;Family relationships  Family Stress Factors Health changes;Family relationships  Advance Directives (For Healthcare)  Does patient have an advance directive? No  Would patient like information on creating an advanced directive? Yes - Educational materials given  Marcelino Scot 10/18/2014 12:48 PM

## 2014-10-18 NOTE — Progress Notes (Signed)
Peripherally Inserted Central Catheter/Midline Placement  The IV Nurse has discussed with the patient and/or persons authorized to consent for the patient, the purpose of this procedure and the potential benefits and risks involved with this procedure.  The benefits include less needle sticks, lab draws from the catheter and patient may be discharged home with the catheter.  Risks include, but not limited to, infection, bleeding, blood clot (thrombus formation), and puncture of an artery; nerve damage and irregular heat beat.  Alternatives to this procedure were also discussed.  PICC/Midline Placement Documentation  PICC / Midline Double Lumen 53/79/43 PICC Right Basilic 42 cm 0 cm (Active)  Indication for Insertion or Continuance of Line Prolonged intravenous therapies 10/18/2014  4:00 PM  Exposed Catheter (cm) 0 cm 10/18/2014  4:00 PM  Site Assessment Clean;Dry;Intact 10/18/2014  4:00 PM  Dressing Change Due 10/25/14 10/18/2014  4:00 PM       Urijah Arko, Maricela Bo 10/18/2014, 4:14 PM

## 2014-10-18 NOTE — Progress Notes (Signed)
PULMONARY / CRITICAL CARE MEDICINE   Name: Teresa Wiley MRN: [id number] DOB: June 14, 1956    ADMISSION DATE:  10/16/2014 CONSULTATION DATE:  10/17/14  REFERRING MD :  Dr. Roel Cluck  CHIEF COMPLAINT:  Cough and confusion  INITIAL PRESENTATION: 58 year old morbidly obese female who is very sedentary presenting with SOB and cough and confusion.  Was found to be fluid overloaded and hypoxemic.  STUDIES:  CXR 10/16/14: The heart size appears normal. There are bilateral pleural effusions and interstitial edema consistent with CHF. Diminished aeration to left base is nonspecific and may represent asymmetric edema or pneumonia.  SIGNIFICANT EVENTS: Echo with 14 mmHg rise in PAP with EF of 60%.  SUBJECTIVE: Continues to complain of SOB and cough.  VITAL SIGNS: Temp:  [98.1 F (36.7 C)-98.7 F (37.1 C)] 98.6 F (37 C) (11/24 0810) Pulse Rate:  [78-96] 80 (11/24 0810) Resp:  [11-23] 11 (11/24 0810) BP: (89-113)/(52-77) 113/77 mmHg (11/24 0810) SpO2:  [72 %-98 %] 94 % (11/24 0827) Weight:  [121.4 kg (267 lb 10.2 oz)] 121.4 kg (267 lb 10.2 oz) (11/24 0500)   HEMODYNAMICS:   VENTILATOR SETTINGS:   INTAKE / OUTPUT:  Intake/Output Summary (Last 24 hours) at 10/18/14 1052 Last data filed at 10/18/14 0125  Gross per 24 hour  Intake    290 ml  Output   1650 ml  Net  -1360 ml    PHYSICAL EXAMINATION: General:  No distress at rest, anxious. Neuro:  No focal weakness, no facial droop, speech normal, moving all ext to command. HEENT:  Atraumatic, no stridor Cardiovascular:  RRR, no loud murmur Lungs:  Mild expiratory wheeze, no rales Abdomen:  Soft, nontender, no guarding Musculoskeletal:  No defomrmities, no leg edema Skin:  No ecchymosis, no rash  LABS:  CBC  Recent Labs Lab 10/16/14 2103 10/17/14 0540 10/18/14 0235  WBC 11.9* 11.5* 9.0  HGB 10.5* 10.8* 10.3*  HCT 36.3 35.9* 35.7*  PLT 381 373 356   Coag's  Recent Labs Lab 10/16/14 2103 10/17/14 0540  INR 1.01  0.97   BMET  Recent Labs Lab 10/16/14 2103 10/17/14 0540 10/18/14 0235  NA 141 141 141  K 4.5 4.1 3.8  CL 102 99 97  CO2 '25 26 31  ' BUN '14 13 9  ' CREATININE 1.11* 1.00 0.81  GLUCOSE 128* 114* 111*   Electrolytes  Recent Labs Lab 10/16/14 2103 10/17/14 0540 10/18/14 0235  CALCIUM 8.7 8.9 8.9  MG  --  2.1  --    Sepsis Markers  Recent Labs Lab 10/16/14 2116 10/17/14 1344 10/17/14 1400  LATICACIDVEN 1.48  --  1.2  PROCALCITON  --  0.12  --    ABG  Recent Labs Lab 10/16/14 2145 10/17/14 0153  PHART 7.320* 7.336*  PCO2ART 60.1* 59.0*  PO2ART 130.0* 62.0*   Liver Enzymes  Recent Labs Lab 10/16/14 2103 10/17/14 0540 10/18/14 0235  AST '12 11 12  ' ALT '12 12 12  ' ALKPHOS 154* 153* 149*  BILITOT 0.7 0.6 0.7  ALBUMIN 3.1* 3.4* 3.2*   Cardiac Enzymes  Recent Labs Lab 10/16/14 2245 10/17/14 0540 10/17/14 0654 10/17/14 1614  TROPONINI  --  <0.30 <0.30 <0.30  PROBNP 1935.0* 1221.0*  --   --    Glucose  Recent Labs Lab 10/16/14 2105 10/17/14 2133 10/18/14 0747  GLUCAP 129* 158* 129*    Imaging Dg Chest 2 View  10/17/2014   CLINICAL DATA:  58 year old female with shortness of breath, weakness and healthcare associated pneumonia  EXAM: CHEST  2 VIEW  COMPARISON:  Prior chest x-ray 10/16/2014  FINDINGS: Improved inspiratory volumes with decreased interstitial edema. Persistent pulmonary vascular congestion and mild edema. No definite focal airspace consolidation. Subpleural thickening bilaterally likely represents subpleural fat. No definite pleural effusion on the lateral view. Mild cardiomegaly. No pneumothorax. No acute osseous abnormality.  IMPRESSION: Improved inspiratory volumes and decreasing pulmonary interstitial edema consistent with improving CHF.  No definite airspace consolidation to suggest pneumonia.   Electronically Signed   By: Jacqulynn Cadet M.D.   On: 10/17/2014 15:42   ASSESSMENT / PLAN:  PULMONARY A: hypoxic + hypercarbic  respiratory failure, possible multifactorial: obesity, possible COPD (smoker), untreated OSA, pulmonary HTN, diastolic HF. Less likely PNA with procalcitonin of 0.12.  Another concern is PE with a rise of 14 mmHg in PAP in 3 months. P:   - O2 as needed, will likely need to be discharged on O2. - Recommend pre-treatment with benadryl and steroids and obtaining a CTA to R/O PE, lower ext dopplers are negative but the sudden rise in PAP and dilated RV are concerning. - Significant wt loss. - Smoking cessation. - Bronchodilators as ordered. - Ambulation. - Diureses as able (would slow down if will be getting a CTA however).  CARDIOVASCULAR A: transient hypotension - resolved. Diastolic HF.  Pulmonary HTN (multi factorial, obesity, OSA, heart failure, highly doubt an auto-immune process). P:  - BP now normal, would encourage a cards consult. - Check ESR and CRP.  RENAL A:  Mild AKI - resolved. P:   - Diureses as able. - Watch function specially if a CTA is ordered.  GASTROINTESTINAL A:  No acute issues P:   - Diet per primary.  HEMATOLOGIC A:  Mild acute anemia P:  - Hold anti-coag given hemoptysis. - Monitor.  INFECTIOUS A:  Questionable PNA, WBC is normal.  Cultures negative.  PCT 0.12 P:   BCx2 negative Sputum negative Abx: Zosyn 10/17/14 >>> Recommend d/c abx but will defer to primary.  ENDOCRINE A:  No acute issues P:   Goal blood sugar 140-180  NEUROLOGIC A:  Mild encephalopathy, improving, likely from combined hypoxic and hypercarbic resp failure P:   Monitor. CT head negative.   Rush Farmer, M.D. [facility] Pulmonary/Critical Care Medicine. Pager: [phone]. After hours pager: [phone].  10/18/2014, 10:52 AM
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
## 2014-10-18 NOTE — Plan of Care (Signed)
Problem: Consults Goal: Diabetes Guidelines if Diabetic/Glucose > 140 If diabetic or lab glucose is > 140 mg/dl - Initiate Diabetes/Hyperglycemia Guidelines & Document Interventions  Outcome: Completed/Met Date Met:  10/18/14  Problem: Phase I Progression Outcomes Goal: Pain controlled with appropriate interventions Outcome: Completed/Met Date Met:  10/18/14

## 2014-10-18 NOTE — Progress Notes (Signed)
Nutrition Brief Note  Patient identified on the Malnutrition Screening Tool (MST) Report for recent weight lost without trying.  Per wt readings below, pt has had a 4% weight loss since April 2015; not significant for time frame & desirable given obesity.  Wt Readings from Last 15 Encounters:  10/18/14 267 lb 10.2 oz (121.4 kg)  07/21/14 277 lb 5.4 oz (125.8 kg)  03/08/14 277 lb (125.646 kg)  08/17/12 260 lb (117.935 kg)  08/06/12 288 lb (130.636 kg)  07/24/12 288 lb (130.636 kg)    Body mass index is 39.51 kg/(m^2). Patient meets criteria for Obesity Class II based on current BMI.   Current diet order is Carbohydrate Modified, patient is consuming approximately 75% of meals at this time. Labs and medications reviewed.   No nutrition interventions warranted at this time. If nutrition issues arise, please consult RD.   Arthur Holms, RD, LDN Pager #: [phone] After-Hours Pager #: [phone]

## 2014-10-18 NOTE — Plan of Care (Signed)
Problem: Consults Goal: Pneumonia Patient Education See Patient Educatio Module for education specifics. Outcome: Progressing  Problem: Phase I Progression Outcomes Goal: Dyspnea controlled at rest Outcome: Completed/Met Date Met:  10/18/14 Goal: OOB as tolerated unless otherwise ordered Outcome: Completed/Met Date Met:  10/18/14 Goal: Confirm chest x-ray completed Outcome: Completed/Met Date Met:  10/18/14 Goal: Voiding-avoid urinary catheter unless indicated Outcome: Completed/Met Date Met:  10/18/14 Goal: Hemodynamically stable Outcome: Completed/Met Date Met:  10/18/14  Problem: Phase II Progression Outcomes Goal: Tolerating diet Outcome: Completed/Met Date Met:  10/18/14

## 2014-10-19 ENCOUNTER — Inpatient Hospital Stay (HOSPITAL_COMMUNITY): Payer: Medicaid Other

## 2014-10-19 DIAGNOSIS — R042 Hemoptysis: Secondary | ICD-10-CM

## 2014-10-19 DIAGNOSIS — I270 Primary pulmonary hypertension: Secondary | ICD-10-CM

## 2014-10-19 LAB — GLUCOSE, CAPILLARY
GLUCOSE-CAPILLARY: 142 mg/dL — AB (ref 70–99)
GLUCOSE-CAPILLARY: 168 mg/dL — AB (ref 70–99)
GLUCOSE-CAPILLARY: 170 mg/dL — AB (ref 70–99)
Glucose-Capillary: 158 mg/dL — ABNORMAL HIGH (ref 70–99)
Glucose-Capillary: 189 mg/dL — ABNORMAL HIGH (ref 70–99)
Glucose-Capillary: 169 mg/dL — ABNORMAL HIGH (ref 70–99)

## 2014-10-19 LAB — GRAM STAIN: Special Requests: NORMAL

## 2014-10-19 LAB — LEGIONELLA ANTIGEN, URINE

## 2014-10-19 LAB — EXPECTORATED SPUTUM ASSESSMENT W REFEX TO RESP CULTURE: SPECIAL REQUESTS: NORMAL

## 2014-10-19 LAB — PROCALCITONIN

## 2014-10-19 LAB — EXPECTORATED SPUTUM ASSESSMENT W GRAM STAIN, RFLX TO RESP C

## 2014-10-19 MED ORDER — IOHEXOL 350 MG/ML SOLN
100.0000 mL | Freq: Once | INTRAVENOUS | Status: AC | PRN
  Administered 2014-10-19: 100 mL via INTRAVENOUS

## 2014-10-19 MED ORDER — OXYCODONE HCL 5 MG PO TABS
5.0000 mg | ORAL_TABLET | Freq: Four times a day (QID) | ORAL | Status: DC | PRN
  Administered 2014-10-20 – 2014-10-24 (×10): 10 mg via ORAL
  Administered 2014-10-24: 5 mg via ORAL
  Administered 2014-10-24: 10 mg via ORAL
  Filled 2014-10-19 (×8): qty 2
  Filled 2014-10-19: qty 1
  Filled 2014-10-19 (×8): qty 2

## 2014-10-19 MED ORDER — ONDANSETRON HCL 4 MG PO TABS: 4.0000 mg | ORAL_TABLET | Freq: Four times a day (QID) | ORAL | Status: DC | PRN

## 2014-10-19 MED ORDER — INSULIN ASPART 100 UNIT/ML ~~LOC~~ SOLN
0.0000 [IU] | Freq: Three times a day (TID) | SUBCUTANEOUS | Status: DC
  Administered 2014-10-20 – 2014-10-22 (×6): 3 [IU] via SUBCUTANEOUS
  Administered 2014-10-23 (×4): 2 [IU] via SUBCUTANEOUS
  Administered 2014-10-24: 3 [IU] via SUBCUTANEOUS
  Administered 2014-10-24: 2 [IU] via SUBCUTANEOUS

## 2014-10-19 MED ORDER — DILTIAZEM HCL 30 MG PO TABS
30.0000 mg | ORAL_TABLET | Freq: Three times a day (TID) | ORAL | Status: DC
  Administered 2014-10-19 – 2014-10-21 (×7): 30 mg via ORAL
  Filled 2014-10-19 (×10): qty 1

## 2014-10-19 MED ORDER — HEPARIN SODIUM (PORCINE) 5000 UNIT/ML IJ SOLN
5000.0000 [IU] | Freq: Three times a day (TID) | INTRAMUSCULAR | Status: DC
  Administered 2014-10-19 – 2014-10-24 (×13): 5000 [IU] via SUBCUTANEOUS
  Filled 2014-10-19 (×20): qty 1

## 2014-10-19 NOTE — Progress Notes (Signed)
Upon arrival to room, patient complaining of sudden onset of blurred vision and that it was like a "kleidoscope" in her eyes.  Patient had just laid back down in bed after sitting up on the side of the bed to bath herself.  Neuro exam intact. PERRLA. Patient is shaking and appears to be anxious. Due for scheduled ativan at this time. Vital signs stable. [place] notified.  Patient states she is feeling 'slightly better.' No further orders at this time.  Will continue to monitor and notify NP if symptoms occur or worsen.    M.Forest Gleason, RN

## 2014-10-19 NOTE — Progress Notes (Addendum)
RN paged secondary to pt c/o blurry vision, like a "kleidoscope" in her eyes. She has sat up during that time and by the time this NP called RN, it had mostly resolved. Also, endorses being light headed during event. RN says pt is anxious. Neuro exam per RN revealed no other neuro deficits. Likely etiology is a drop in BP when she sat up. BP is OK now. This is resolving. RN to call back if recurs, worsens, or other neuro deficit is noted. Pt had CT head on admit which was neg for acute issues.  Clance Boll, NP [facility]

## 2014-10-19 NOTE — Progress Notes (Signed)
PULMONARY / CRITICAL CARE MEDICINE   Name: Teresa Wiley MRN: [id number] DOB: 01/28/1956    ADMISSION DATE:  10/16/2014 CONSULTATION DATE:  10/17/14  REFERRING MD :  Dr. Roel Cluck  CHIEF COMPLAINT:  Cough and confusion  INITIAL PRESENTATION: 58 year old morbidly obese female who is very sedentary presenting with SOB and cough and confusion.  Was found to be fluid overloaded and hypoxemic.  STUDIES:  CXR 10/16/14: The heart size appears normal. There are bilateral pleural effusions and interstitial edema consistent with CHF. Diminished aeration to left base is nonspecific and may represent asymmetric edema or pneumonia.  SIGNIFICANT EVENTS: Echo with 14 mmHg rise in PAP with EF of 60%.  SUBJECTIVE: Continues to complain of SOB and cough.  VITAL SIGNS: Temp:  [98 F (36.7 C)-99.1 F (37.3 C)] 98.7 F (37.1 C) (11/25 0337) Pulse Rate:  [75-92] 82 (11/25 0337) Resp:  [11-17] 12 (11/25 0337) BP: (109-130)/(64-86) 128/80 mmHg (11/25 0337) SpO2:  [91 %-98 %] 95 % (11/25 0337) Weight:  [121.4 kg (267 lb 10.2 oz)-121.7 kg (268 lb 4.8 oz)] 121.7 kg (268 lb 4.8 oz) (11/25 0337)   HEMODYNAMICS:   VENTILATOR SETTINGS:   INTAKE / OUTPUT:  Intake/Output Summary (Last 24 hours) at 10/19/14 0431 Last data filed at 10/18/14 2200  Gross per 24 hour  Intake    240 ml  Output   1575 ml  Net  -1335 ml    PHYSICAL EXAMINATION: General:  No distress at rest, anxious. Neuro:  No focal weakness, no facial droop, speech normal, moving all ext to command. HEENT:  Atraumatic, no stridor Cardiovascular:  RRR, no loud murmur Lungs:  Mild expiratory wheeze, no rales Abdomen:  Soft, nontender, no guarding Musculoskeletal:  No defomrmities, no leg edema Skin:  No ecchymosis, no rash  LABS:  CBC  Recent Labs Lab 10/16/14 2103 10/17/14 0540 10/18/14 0235  WBC 11.9* 11.5* 9.0  HGB 10.5* 10.8* 10.3*  HCT 36.3 35.9* 35.7*  PLT 381 373 356   Coag's  Recent Labs Lab 10/16/14 2103  10/17/14 0540  INR 1.01 0.97   BMET  Recent Labs Lab 10/16/14 2103 10/17/14 0540 10/18/14 0235  NA 141 141 141  K 4.5 4.1 3.8  CL 102 99 97  CO2 '25 26 31  ' BUN '14 13 9  ' CREATININE 1.11* 1.00 0.81  GLUCOSE 128* 114* 111*   Electrolytes  Recent Labs Lab 10/16/14 2103 10/17/14 0540 10/18/14 0235  CALCIUM 8.7 8.9 8.9  MG  --  2.1  --    Sepsis Markers  Recent Labs Lab 10/16/14 2116 10/17/14 1344 10/17/14 1400  LATICACIDVEN 1.48  --  1.2  PROCALCITON  --  0.12  --    ABG  Recent Labs Lab 10/16/14 2145 10/17/14 0153  PHART 7.320* 7.336*  PCO2ART 60.1* 59.0*  PO2ART 130.0* 62.0*   Liver Enzymes  Recent Labs Lab 10/16/14 2103 10/17/14 0540 10/18/14 0235  AST '12 11 12  ' ALT '12 12 12  ' ALKPHOS 154* 153* 149*  BILITOT 0.7 0.6 0.7  ALBUMIN 3.1* 3.4* 3.2*   Cardiac Enzymes  Recent Labs Lab 10/16/14 2245 10/17/14 0540 10/17/14 0654 10/17/14 1614  TROPONINI  --  <0.30 <0.30 <0.30  PROBNP 1935.0* 1221.0*  --   --    Glucose  Recent Labs Lab 10/18/14 0747 10/18/14 1245 10/18/14 1703 10/18/14 2049 10/18/14 2350 10/19/14 0336  GLUCAP 129* 116* 126* 129* 142* 158*   Imaging No results found. ASSESSMENT / PLAN:  PULMONARY A: hypoxic + hypercarbic  respiratory failure, possible multifactorial: obesity, possible COPD (smoker), untreated OSA, pulmonary HTN, diastolic HF. Less likely PNA with procalcitonin of 0.12.  Another concern is PE with a rise of 14 mmHg in PAP in 3 months. P:   - O2 as needed, will likely need to be discharged on O2 so would recommend an ambulatory desat study on RA right before discharge. - Recommend pre-treatment with benadryl and steroids and obtaining a CTA to R/O PE, lower ext dopplers are negative but the sudden rise in PAP and dilated RV are concerning, specially now that MED line is in. - Significant wt loss. - Smoking cessation. - Bronchodilators as ordered. - Ambulation. - Diureses as able (would slow down if will  be getting a CTA however).  CARDIOVASCULAR A: transient hypotension - resolved. Diastolic HF.  Pulmonary HTN (multi factorial, obesity, OSA, heart failure, highly doubt an auto-immune process). P:  - BP now normal, would encourage a cards consult. - Check ESR and CRP.  RENAL A:  Mild AKI - resolved. P:   - Diureses as able. - Watch function specially if a CTA is ordered.  GASTROINTESTINAL A:  No acute issues P:   - Diet per primary.  HEMATOLOGIC A:  Mild acute anemia P:  - Hold anti-coag given hemoptysis. - Monitor.  INFECTIOUS A:  Questionable PNA, WBC is normal.  Cultures negative.  PCT 0.12 P:   BCx2 negative Sputum negative Abx: Zosyn 10/17/14 >>>11/24 Monitor off abx.  ENDOCRINE A:  No acute issues P:   Goal blood sugar 140-180  NEUROLOGIC A:  Mild encephalopathy, improving, likely from combined hypoxic and hypercarbic resp failure P:   Monitor. CT head negative.   PCCM will see again on Friday, if CTA is negative would recommend starting heparin.  Rush Farmer, M.D. [facility] Pulmonary/Critical Care Medicine. Pager: [phone]. After hours pager: [phone].  10/19/2014, 4:31 AM

## 2014-10-19 NOTE — Progress Notes (Signed)
Informed pt that she can decided to wear her CPAP if needed; pt explained that she has a high level of anxiety when wearing masks (or anything on face).

## 2014-10-19 NOTE — Progress Notes (Signed)
[place] TEAM 1 - Stepdown/ICU TEAM Progress Note  Teresa Wiley KGM:[id number] DOB: [birth year] DOA: 10/16/2014 PCP: Ellender Hose, MD  Admit HPI / Brief Narrative: 58 yo female w/ a history of Asthma; Hypertension; Degenerative disc disease w/ chronic back pain; Reflux; Diabetes mellitus; and morbid obesity who presented with shortness of breath and confusion. On arrival patient was noted to be hypoxic to 40%. CXR showed evidence of fluid overload and possible infiltrate. Patient was noted to be slightly hypotensive 90/60. WBC elevated to 11.9 with low grade fever up to 99.6.   HPI/Subjective: Pt complains that her pain is uncontrolled, and she feels she is "going into withdrawal" with her lower narcotic doses.  She states her sob has improved, and denies cp, n/v, or abdom pain.    Assessment/Plan:  Acute encephalopathy likely from combined hypoxic and hypercarbic resp failure + narcotics + benzos - CT head negative - slowly improving   Hypoxic hypercarbic acute respiratory failure  obesity, possible COPD (smoker), untreated OSA, diastolic HF - PCCM following - counseled pt at length on need for no smoking, weight loss, minimization of sedating meds, and need for probable home O2   Elevated d-dimer Pt at high risk for PE given obesity but CT angio of chest was unrevealing - dopplers were negative for DVT   Pulmonary HTN multi factorial:  obesity, OSA, heart failure - cont O2 therapy - begin CCB - continue diuresis - consider Cards eval for further tx options if Pulm not able to follow this   ?LLL HCAP F/u CXR makes this appear less likely - no fever or leukocytosis - procalcitonin negative - now off abx   Chronic pain w/ narcotic/benzo dependence Dr. Sherral Hammers has adjusted tx regimen - today pt is complaining that her pain is not well controlled - have increased oxycodone to 5-10mg  prn, but not changed frequency - agree that high dose narcotics in this pt w/ probable OHS/SA and  signif hypoxic resp failure is simply too dangerous - have counseled pt extensively that I will NOT [place] further as I feel this would be dangerous   Transient low grade hemoptysis  SCDs only for DVT prophy  Tobacco abuse  Counseled at length on absolute need to stop smoking   Hypotension  Resolved  HTN  DDD w/ chronic back pain  See discussion above  DM CBGs reasonably controlled - follow w/o change today   Morbid obesity - Body mass index is 39.6 kg/(m^2). Discussed link between obesity and her current illness - discussed need for signif lasting weight loss  Code Status: FULL Family Communication: wife present at bedside - children at bedside  Disposition Plan: SDU  Consultants: PCCM  Procedures: TTE - 11/23 - EF 60% - no WMA - PA pressure 10mm Hg B Venous duplex - 11/23 - no DVT or SVT  Antibiotics: Zosyn 10/16/14 >10/18/14  DVT prophylaxis: SQ heparin   Objective: Blood pressure 147/86, pulse 93, temperature 98.3 F (36.8 C), temperature source Oral, resp. rate 14, height 5\' 9"  (1.753 m), weight 121.7 kg (268 lb 4.8 oz), SpO2 98 %.  Intake/Output Summary (Last 24 hours) at 10/19/14 1510 Last data filed at 10/19/14 1212  Gross per 24 hour  Intake    240 ml  Output   3025 ml  Net  -2785 ml   Exam: General: No acute respiratory distress in bed on high flow [place] O2 Lungs: Clear to auscultation bilaterally without wheezes or crackles - very distant BS th/o  Cardiovascular: Regular rate and rhythm without murmur gallop or rub normal S1 and S2 - distant HS  Abdomen: Nontender, obese, soft, bowel sounds positive, no rebound, no ascites, no appreciable mass Extremities: No significant cyanosis, clubbing;  1+edema bilateral lower extremities  Data Reviewed: Basic Metabolic Panel:  Recent Labs Lab 10/16/14 2103 10/17/14 0540 10/18/14 0235  NA 141 141 141  K 4.5 4.1 3.8  CL 102 99 97  CO2 25 26 31   GLUCOSE 128* 114* 111*  BUN 14  13 9   CREATININE 1.11* 1.00 0.81  CALCIUM 8.7 8.9 8.9  MG  --  2.1  --     Liver Function Tests:  Recent Labs Lab 10/16/14 2103 10/17/14 0540 10/18/14 0235  AST 12 11 12   ALT 12 12 12   ALKPHOS 154* 153* 149*  BILITOT 0.7 0.6 0.7  PROT 7.7 8.0 7.7  ALBUMIN 3.1* 3.4* 3.2*   Coags:  Recent Labs Lab 10/16/14 2103 10/17/14 0540  INR 1.01 0.97   CBC:  Recent Labs Lab 10/16/14 2103 10/17/14 0540 10/18/14 0235  WBC 11.9* 11.5* 9.0  NEUTROABS  --  7.7  --   HGB 10.5* 10.8* 10.3*  HCT 36.3 35.9* 35.7*  MCV 87.3 86.7 89.0  PLT 381 373 356    Cardiac Enzymes:  Recent Labs Lab 10/17/14 0540 10/17/14 0654 10/17/14 1614  TROPONINI <0.30 <0.30 <0.30   BNP (last 3 results)  Recent Labs  10/16/14 2245 10/17/14 0540  PROBNP 1935.0* 1221.0*    CBG:  Recent Labs Lab 10/18/14 2049 10/18/14 2350 10/19/14 0336 10/19/14 0840 10/19/14 1137  GLUCAP 129* 142* 158* 189* 169*    Recent Results (from the past 240 hour(s))  Culture, blood (routine x 2)     Status: None (Preliminary result)   Collection Time: 10/16/14 10:41 PM  Result Value Ref Range Status   Specimen Description BLOOD RIGHT HAND  Final   Special Requests BOTTLES DRAWN AEROBIC AND ANAEROBIC 5CC EACH  Final   Culture  Setup Time   Final    10/17/2014 10:13 Performed at [facility]    Culture   Final           BLOOD CULTURE RECEIVED NO GROWTH TO DATE CULTURE WILL BE HELD FOR 5 DAYS BEFORE ISSUING A FINAL NEGATIVE REPORT Performed at [facility]    Report Status PENDING  Incomplete  Culture, blood (routine x 2)     Status: None (Preliminary result)   Collection Time: 10/16/14 10:43 PM  Result Value Ref Range Status   Specimen Description BLOOD LEFT HAND  Final   Special Requests BOTTLES DRAWN AEROBIC AND ANAEROBIC 5CC  Final   Culture  Setup Time   Final    10/17/2014 10:13 Performed at [facility]    Culture   Final           BLOOD CULTURE RECEIVED NO GROWTH TO  DATE CULTURE WILL BE HELD FOR 5 DAYS BEFORE ISSUING A FINAL NEGATIVE REPORT Performed at [facility]    Report Status PENDING  Incomplete  MRSA PCR Screening     Status: None   Collection Time: 10/17/14  2:41 PM  Result Value Ref Range Status   MRSA by PCR NEGATIVE NEGATIVE Final    Comment:        The GeneXpert MRSA Assay (FDA approved for NASAL specimens only), is one component of a comprehensive MRSA colonization surveillance program. It is not intended to diagnose MRSA infection nor to guide or  monitor treatment for MRSA infections.   Culture, sputum-assessment     Status: None   Collection Time: 10/19/14  7:53 AM  Result Value Ref Range Status   Specimen Description SPUTUM  Final   Special Requests Normal  Final   Sputum evaluation   Final    MICROSCOPIC FINDINGS SUGGEST THAT THIS SPECIMEN IS NOT REPRESENTATIVE OF LOWER RESPIRATORY SECRETIONS. PLEASE RECOLLECT. Gram Stain Report Called to,Read Back By and Verified With: Jannette Spanner AT [phone] 10/19/14 BY K BARR    Report Status 10/19/2014 FINAL  Final  Gram stain     Status: None   Collection Time: 10/19/14  7:54 AM  Result Value Ref Range Status   Specimen Description SPUTUM  Final   Special Requests Normal  Final   Gram Stain   Final    SQUAMOUS EPITHELIAL CELLS PRESENT FEW GRAM POSITIVE COCCI IN PAIRS IN CHAINS FEW GRAM POSITIVE COCCI IN PAIRS IN CLUSTERS RARE YEAST    Report Status 10/19/2014 FINAL  Final     Studies:  Recent x-ray studies have been reviewed in detail by the Attending Physician  Scheduled Meds:  Scheduled Meds: . atorvastatin  10 mg Oral Daily  . furosemide  20 mg Intravenous BID  . gabapentin  300 mg Oral TID  . guaiFENesin  600 mg Oral BID  . insulin aspart  0-15 Units Subcutaneous 6 times per day  . LORazepam  1 mg Oral BID  . mometasone-formoterol  2 puff Inhalation BID  . ondansetron  4 mg Oral Q6H  . OxyCODONE  40 mg Oral Q12H  . pantoprazole  40 mg Oral Daily  .  tiotropium  18 mcg Inhalation Daily    Time spent on care of this patient: 35 mins   Erby Sanderson T , MD   [facility] Office  [phone] Pager - Text Page per Shea Evans as per below:  On-Call/Text Page:      [email or web address]      password [email or web address]  If 7PM-7AM, please contact night-coverage [email or web address] Password [email or web address] 10/19/2014, 3:10 PM   LOS: 3 days

## 2014-10-19 NOTE — Plan of Care (Signed)
Problem: Phase II Progression Outcomes Goal: Pain controlled Outcome: Not Progressing

## 2014-10-20 LAB — COMPREHENSIVE METABOLIC PANEL
ALT: 49 U/L — ABNORMAL HIGH (ref 0–35)
ANION GAP: 14 (ref 5–15)
AST: 45 U/L — ABNORMAL HIGH (ref 0–37)
Albumin: 3.8 g/dL (ref 3.5–5.2)
Alkaline Phosphatase: 150 U/L — ABNORMAL HIGH (ref 39–117)
BUN: 18 mg/dL (ref 6–23)
CALCIUM: 9.6 mg/dL (ref 8.4–10.5)
CO2: 30 meq/L (ref 19–32)
CREATININE: 0.83 mg/dL (ref 0.50–1.10)
Chloride: 98 mEq/L (ref 96–112)
GFR, EST AFRICAN AMERICAN: 89 mL/min — AB (ref >90)
GFR, EST NON AFRICAN AMERICAN: 77 mL/min — AB (ref >90)
GLUCOSE: 164 mg/dL — AB (ref 70–99)
Potassium: 3.8 mEq/L (ref 3.7–5.3)
Sodium: 142 mEq/L (ref 137–147)
Total Bilirubin: 0.6 mg/dL (ref 0.3–1.2)
Total Protein: 8.4 g/dL — ABNORMAL HIGH (ref 6.0–8.3)

## 2014-10-20 LAB — GLUCOSE, CAPILLARY
GLUCOSE-CAPILLARY: 169 mg/dL — AB (ref 70–99)
Glucose-Capillary: 143 mg/dL — ABNORMAL HIGH (ref 70–99)
Glucose-Capillary: 148 mg/dL — ABNORMAL HIGH (ref 70–99)
Glucose-Capillary: 162 mg/dL — ABNORMAL HIGH (ref 70–99)
Glucose-Capillary: 177 mg/dL — ABNORMAL HIGH (ref 70–99)

## 2014-10-20 MED ORDER — PREDNISONE 50 MG PO TABS
50.0000 mg | ORAL_TABLET | Freq: Four times a day (QID) | ORAL | Status: AC
  Administered 2014-10-20 – 2014-10-21 (×3): 50 mg via ORAL
  Filled 2014-10-20 (×3): qty 1

## 2014-10-20 MED ORDER — DULOXETINE HCL 30 MG PO CPEP
30.0000 mg | ORAL_CAPSULE | Freq: Every day | ORAL | Status: DC
  Administered 2014-10-20 – 2014-10-24 (×4): 30 mg via ORAL
  Filled 2014-10-20 (×6): qty 1

## 2014-10-20 MED ORDER — SODIUM CHLORIDE 0.9 % IV SOLN
1.0000 mL/kg/h | INTRAVENOUS | Status: DC
  Administered 2014-10-21 (×2): 1 mL/kg/h via INTRAVENOUS

## 2014-10-20 MED ORDER — DIPHENHYDRAMINE HCL 50 MG PO CAPS
50.0000 mg | ORAL_CAPSULE | Freq: Once | ORAL | Status: AC
  Administered 2014-10-21: 50 mg via ORAL
  Filled 2014-10-20: qty 1

## 2014-10-20 MED ORDER — ASPIRIN 81 MG PO CHEW
81.0000 mg | CHEWABLE_TABLET | ORAL | Status: AC
  Administered 2014-10-21: 81 mg via ORAL
  Filled 2014-10-20: qty 1

## 2014-10-20 MED ORDER — SODIUM CHLORIDE 0.9 % IJ SOLN
3.0000 mL | Freq: Two times a day (BID) | INTRAMUSCULAR | Status: DC
  Administered 2014-10-20: 3 mL via INTRAVENOUS

## 2014-10-20 MED ORDER — METFORMIN HCL 500 MG PO TABS
500.0000 mg | ORAL_TABLET | Freq: Two times a day (BID) | ORAL | Status: DC
  Administered 2014-10-20 (×2): 500 mg via ORAL
  Filled 2014-10-20 (×5): qty 1

## 2014-10-20 MED ORDER — SODIUM CHLORIDE 0.9 % IV SOLN: 250.0000 mL | INTRAVENOUS | Status: DC | PRN

## 2014-10-20 MED ORDER — SODIUM CHLORIDE 0.9 % IJ SOLN
3.0000 mL | INTRAMUSCULAR | Status: DC | PRN
  Administered 2014-10-20: 3 mL via INTRAVENOUS
  Filled 2014-10-20: qty 3

## 2014-10-20 NOTE — Progress Notes (Signed)
Pt. Refused CPAP for tonight. Pt. Is aware to inform RT or RN if she changes her mind & decides to wear CPAP.
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
## 2014-10-20 NOTE — Plan of Care (Signed)
Problem: Phase III Progression Outcomes Goal: Tolerating diet Outcome: Progressing

## 2014-10-21 ENCOUNTER — Encounter (HOSPITAL_COMMUNITY)
Admission: EM | Disposition: A | Discharge status: Home Health | Payer: Self-pay | Source: Home / Self Care | Attending: Internal Medicine

## 2014-10-21 HISTORY — PX: LEFT AND RIGHT HEART CATHETERIZATION WITH CORONARY ANGIOGRAM: SHX5449

## 2014-10-21 LAB — GLUCOSE, CAPILLARY
GLUCOSE-CAPILLARY: 190 mg/dL — AB (ref 70–99)
GLUCOSE-CAPILLARY: 168 mg/dL — AB (ref 70–99)
Glucose-Capillary: 169 mg/dL — ABNORMAL HIGH (ref 70–99)
Glucose-Capillary: 170 mg/dL — ABNORMAL HIGH (ref 70–99)
Glucose-Capillary: 192 mg/dL — ABNORMAL HIGH (ref 70–99)
Glucose-Capillary: 213 mg/dL — ABNORMAL HIGH (ref 70–99)
Glucose-Capillary: 215 mg/dL — ABNORMAL HIGH (ref 70–99)

## 2014-10-21 LAB — POCT I-STAT 3, VENOUS BLOOD GAS (G3P V)
Acid-Base Excess: 5 mmol/L — ABNORMAL HIGH (ref 0.0–2.0)
Acid-Base Excess: 5 mmol/L — ABNORMAL HIGH (ref 0.0–2.0)
Bicarbonate: 30 mEq/L — ABNORMAL HIGH (ref 20.0–24.0)
Bicarbonate: 30.6 mEq/L — ABNORMAL HIGH (ref 20.0–24.0)
O2 SAT: 79 %
O2 Saturation: 75 %
PCO2 VEN: 45.7 mmHg (ref 45.0–50.0)
PCO2 VEN: 45.8 mmHg (ref 45.0–50.0)
PO2 VEN: 40 mmHg (ref 30.0–45.0)
TCO2: 31 mmol/L (ref 0–100)
TCO2: 32 mmol/L (ref 0–100)
pH, Ven: 7.424 — ABNORMAL HIGH (ref 7.250–7.300)
pH, Ven: 7.433 — ABNORMAL HIGH (ref 7.250–7.300)
pO2, Ven: 43 mmHg (ref 30.0–45.0)

## 2014-10-21 LAB — POCT I-STAT 3, ART BLOOD GAS (G3+)
ACID-BASE EXCESS: 4 mmol/L — AB (ref 0.0–2.0)
BICARBONATE: 29.2 meq/L — AB (ref 20.0–24.0)
O2 SAT: 94 %
TCO2: 31 mmol/L (ref 0–100)
pCO2 arterial: 42.9 mmHg (ref 35.0–45.0)
pH, Arterial: 7.442 (ref 7.350–7.450)
pO2, Arterial: 68 mmHg — ABNORMAL LOW (ref 80.0–100.0)

## 2014-10-21 LAB — POCT ACTIVATED CLOTTING TIME: Activated Clotting Time: 219 seconds

## 2014-10-21 LAB — PROCALCITONIN: Procalcitonin: <0.1 ng/mL

## 2014-10-21 SURGERY — LEFT AND RIGHT HEART CATHETERIZATION WITH CORONARY ANGIOGRAM
Anesthesia: LOCAL

## 2014-10-21 MED ORDER — LIDOCAINE HCL (PF) 1 % IJ SOLN
INTRAMUSCULAR | Status: AC
  Filled 2014-10-21: qty 30

## 2014-10-21 MED ORDER — LORAZEPAM 0.5 MG PO TABS
0.5000 mg | ORAL_TABLET | ORAL | Status: DC | PRN
  Administered 2014-10-21: 0.5 mg via ORAL
  Filled 2014-10-21: qty 1

## 2014-10-21 MED ORDER — SODIUM CHLORIDE 0.9 % IV SOLN: 250.0000 mL | INTRAVENOUS | Status: DC | PRN

## 2014-10-21 MED ORDER — HYDRALAZINE HCL 20 MG/ML IJ SOLN
10.0000 mg | Freq: Once | INTRAMUSCULAR | Status: AC
  Administered 2014-10-21: 10 mg via INTRAVENOUS

## 2014-10-21 MED ORDER — HYDRALAZINE HCL 20 MG/ML IJ SOLN
10.0000 mg | INTRAMUSCULAR | Status: DC | PRN
  Administered 2014-10-21: 10 mg via INTRAVENOUS
  Filled 2014-10-21: qty 1

## 2014-10-21 MED ORDER — HYDROMORPHONE HCL 1 MG/ML IJ SOLN
INTRAMUSCULAR | Status: AC
  Filled 2014-10-21: qty 1

## 2014-10-21 MED ORDER — HEPARIN (PORCINE) IN NACL 2-0.9 UNIT/ML-% IJ SOLN
INTRAMUSCULAR | Status: AC
  Filled 2014-10-21: qty 500

## 2014-10-21 MED ORDER — DILTIAZEM HCL 60 MG PO TABS
60.0000 mg | ORAL_TABLET | Freq: Three times a day (TID) | ORAL | Status: AC
  Administered 2014-10-21 – 2014-10-22 (×4): 60 mg via ORAL
  Filled 2014-10-21 (×5): qty 1

## 2014-10-21 MED ORDER — NITROGLYCERIN 1 MG/10 ML FOR IR/CATH LAB
INTRA_ARTERIAL | Status: AC
  Filled 2014-10-21: qty 10

## 2014-10-21 MED ORDER — OXYCODONE HCL ER 40 MG PO T12A
40.0000 mg | EXTENDED_RELEASE_TABLET | Freq: Two times a day (BID) | ORAL | Status: DC
  Administered 2014-10-21 – 2014-10-24 (×6): 40 mg via ORAL
  Filled 2014-10-21 (×3): qty 1
  Filled 2014-10-21 (×2): qty 2
  Filled 2014-10-21: qty 1

## 2014-10-21 MED ORDER — MIDAZOLAM HCL 2 MG/2ML IJ SOLN
INTRAMUSCULAR | Status: AC
  Filled 2014-10-21: qty 2

## 2014-10-21 MED ORDER — VERAPAMIL HCL 2.5 MG/ML IV SOLN
INTRAVENOUS | Status: AC
  Filled 2014-10-21: qty 2

## 2014-10-21 MED ORDER — ZOLPIDEM TARTRATE 5 MG PO TABS
5.0000 mg | ORAL_TABLET | Freq: Every evening | ORAL | Status: DC | PRN
Start: 2014-10-21 — End: 2014-10-23
  Administered 2014-10-22: 5 mg via ORAL
  Filled 2014-10-21: qty 1

## 2014-10-21 MED ORDER — ALUM & MAG HYDROXIDE-SIMETH 200-200-20 MG/5ML PO SUSP
30.0000 mL | Freq: Once | ORAL | Status: DC
  Filled 2014-10-21: qty 30

## 2014-10-21 MED ORDER — HEPARIN (PORCINE) IN NACL 2-0.9 UNIT/ML-% IJ SOLN
INTRAMUSCULAR | Status: AC
  Filled 2014-10-21: qty 1000

## 2014-10-21 MED ORDER — SODIUM CHLORIDE 0.9 % IV SOLN: 1.0000 mL/kg/h | INTRAVENOUS | Status: DC

## 2014-10-21 MED ORDER — HEPARIN SODIUM (PORCINE) 1000 UNIT/ML IJ SOLN
INTRAMUSCULAR | Status: AC
  Filled 2014-10-21: qty 1

## 2014-10-21 MED ORDER — HYDRALAZINE HCL 20 MG/ML IJ SOLN
INTRAMUSCULAR | Status: AC
  Filled 2014-10-21: qty 1

## 2014-10-21 MED ORDER — SODIUM CHLORIDE 0.9 % IJ SOLN: 3.0000 mL | Freq: Two times a day (BID) | INTRAMUSCULAR | Status: DC

## 2014-10-21 MED ORDER — SODIUM CHLORIDE 0.9 % IJ SOLN: 3.0000 mL | INTRAMUSCULAR | Status: DC | PRN

## 2014-10-21 NOTE — Progress Notes (Signed)
PULMONARY / CRITICAL CARE MEDICINE   Name: Teresa Wiley MRN: [id number] DOB: Oct 29, 1956    ADMISSION DATE:  10/16/2014 CONSULTATION DATE:  10/17/14  REFERRING MD :  Dr. Roel Cluck  CHIEF COMPLAINT:  Cough and confusion  INITIAL PRESENTATION: 58 year old morbidly obese female who is very sedentary presenting with SOB and cough and confusion.  Was found to be fluid overloaded and hypoxemic.  STUDIES:  CXR 10/16/14: The heart size appears normal. There are bilateral pleural effusions and interstitial edema consistent with CHF. Diminished aeration to left base is nonspecific and may represent asymmetric edema or pneumonia.  SIGNIFICANT EVENTS: Echo with 14 mmHg rise in PAP with EF of 60%.  SUBJECTIVE: Overnight events noted.  VITAL SIGNS: Temp:  [98.5 F (36.9 C)-99.3 F (37.4 C)] 98.6 F (37 C) (11/27 0817) Pulse Rate:  [89-108] 99 (11/27 0817) Resp:  [12-22] 19 (11/27 0817) BP: (109-148)/(65-87) 148/83 mmHg (11/27 0817) SpO2:  [93 %-98 %] 93 % (11/27 0817) Weight:  [119.8 kg (264 lb 1.8 oz)] 119.8 kg (264 lb 1.8 oz) (11/27 0500)   HEMODYNAMICS:   VENTILATOR SETTINGS:   INTAKE / OUTPUT:  Intake/Output Summary (Last 24 hours) at 10/21/14 1104 Last data filed at 10/21/14 1056  Gross per 24 hour  Intake    786 ml  Output    450 ml  Net    336 ml    PHYSICAL EXAMINATION: General:  No distress at rest, anxious. Neuro:  No focal weakness, no facial droop, speech normal, moving all ext to command. HEENT:  Atraumatic, no stridor Cardiovascular:  RRR, no loud murmur Lungs:  Mild expiratory wheeze, no rales Abdomen:  Soft, nontender, no guarding Musculoskeletal:  No deformities, no leg edema Skin:  No ecchymosis, no rash  LABS:  CBC  Recent Labs Lab 10/16/14 2103 10/17/14 0540 10/18/14 0235  WBC 11.9* 11.5* 9.0  HGB 10.5* 10.8* 10.3*  HCT 36.3 35.9* 35.7*  PLT 381 373 356   Coag's  Recent Labs Lab 10/16/14 2103 10/17/14 0540  INR 1.01 0.97    BMET  Recent Labs Lab 10/17/14 0540 10/18/14 0235 10/20/14 0549  NA 141 141 142  K 4.1 3.8 3.8  CL 99 97 98  CO2 '26 31 30  ' BUN '13 9 18  ' CREATININE 1.00 0.81 0.83  GLUCOSE 114* 111* 164*   Electrolytes  Recent Labs Lab 10/17/14 0540 10/18/14 0235 10/20/14 0549  CALCIUM 8.9 8.9 9.6  MG 2.1  --   --    Sepsis Markers  Recent Labs Lab 10/16/14 2116 10/17/14 1344 10/17/14 1400 10/19/14 0500 10/21/14 0500  LATICACIDVEN 1.48  --  1.2  --   --   PROCALCITON  --  0.12  --  <0.10 <0.10   ABG  Recent Labs Lab 10/16/14 2145 10/17/14 0153  PHART 7.320* 7.336*  PCO2ART 60.1* 59.0*  PO2ART 130.0* 62.0*   Liver Enzymes  Recent Labs Lab 10/17/14 0540 10/18/14 0235 10/20/14 0549  AST 11 12 45*  ALT 12 12 49*  ALKPHOS 153* 149* 150*  BILITOT 0.6 0.7 0.6  ALBUMIN 3.4* 3.2* 3.8   Cardiac Enzymes  Recent Labs Lab 10/16/14 2245 10/17/14 0540 10/17/14 0654 10/17/14 1614  TROPONINI  --  <0.30 <0.30 <0.30  PROBNP 1935.0* 1221.0*  --   --    Glucose  Recent Labs Lab 10/20/14 1142 10/20/14 1633 10/20/14 2029 10/21/14 0027 10/21/14 0425 10/21/14 0819  GLUCAP 169* 148* 143* 170* 213* 215*   Imaging No results found. ASSESSMENT / PLAN:  PULMONARY A: hypoxic + hypercarbic respiratory failure, possible multifactorial: obesity, possible COPD (smoker), untreated OSA, pulmonary HTN, diastolic HF. Less likely PNA with procalcitonin of 0.12.  Another concern is PE with a rise of 14 mmHg in PAP in 3 months. P:   - O2 as needed, will likely need to be discharged on O2 so would recommend an ambulatory desat study on RA right before discharge, if sats of 90% or less then home O2. - CT noted, negative for PE or masses. - Significant wt loss. - Smoking cessation. - Bronchodilators as ordered. - Ambulation. - Diureses as able (would slow down if will be getting a CTA however).  CARDIOVASCULAR A: transient hypotension - resolved. Diastolic HF.  Pulmonary HTN  (multi factorial, obesity, OSA, heart failure, highly doubt an auto-immune process). P:  - BP now normal, would encourage a cards consult. - ESR and CRP not done, moot at this point.  RENAL A:  Mild AKI - resolved. P:   - Diureses as able. - Monitor.  GASTROINTESTINAL A:  No acute issues P:   - Diet per primary.  HEMATOLOGIC A:  Mild acute anemia P:  - May restart anticoagulation if need be as hemoptysis has essentially resolved. - Monitor.  INFECTIOUS A:  Questionable PNA, WBC is normal.  Cultures negative.  PCT 0.12 P:   BCx2 negative Sputum negative Abx: Zosyn 10/17/14 >>>11/24 Monitor off abx.  ENDOCRINE A:  No acute issues P:   Goal blood sugar 140-180  NEUROLOGIC A:  Mild encephalopathy, improving, likely from combined hypoxic and hypercarbic resp failure P:   Monitor. CT head negative.   PCCM will sign off, please call back if needed.  Rush Farmer, M.D. [facility] Pulmonary/Critical Care Medicine. Pager: [phone]. After hours pager: [phone].  10/21/2014, 11:04 AM

## 2014-10-21 NOTE — Progress Notes (Signed)
Pt. Family is at the bedside with a few questions concerning the cardiac cath. Called the cath lab and spoke to [employer] said she would let Dr. Einar Gip know that family was present with questions.

## 2014-10-21 NOTE — Progress Notes (Addendum)
Pt is very upset and refusing to sign procedural consent, get a new IV placed, or have her groin clipped for the cardiac cath in the morning.  Pt stated she does not want to make any decisions until family is present. Notified Dr. Einar Gip. Will continue to monitor and update oncoming shift upon report.

## 2014-10-21 NOTE — Interval H&P Note (Signed)
History and Physical Interval Note:  10/21/2014 12:20 PM  Teresa Wiley  has presented today for surgery, with the diagnosis of cp  The various methods of treatment have been discussed with the patient and family. After consideration of risks, benefits and other options for treatment, the patient has consented to  Procedure(s): LEFT AND RIGHT HEART CATHETERIZATION WITH CORONARY ANGIOGRAM (N/A) and possible PCI as a surgical intervention .  The patient's history has been reviewed, patient examined, no change in status, stable for surgery.  I have reviewed the patient's chart and labs.  Questions were answered to the patient's satisfaction.   Cath Lab Visit (complete for each Cath Lab visit)  Clinical Evaluation Leading to the Procedure:   ACS: No.  Non-ACS:    Anginal Classification: CCS III  Anti-ischemic medical therapy: Minimal Therapy (1 class of medications)  Non-Invasive Test Results: No non-invasive testing performed  Prior CABG: No previous CABG  Patient presenting with acute diastolic heart failure and severe pulmonary hypertension.       Laverda Page

## 2014-10-21 NOTE — Progress Notes (Signed)
Physical Therapy Note  Patient to cath lab. Will follow up when medically ready.    [address] [place], [place]

## 2014-10-21 NOTE — Progress Notes (Signed)
Site area: RFV  Site Prior to Removal:  Level 0 Pressure Applied For:  Manual:    Patient Status During Pull:  stable Post Pull Site:  Level 0 Post Pull Instructions Given:  yes Post Pull Pulses Present: palpable Dressing Applied:  bandaid Bedrest begins @ 1007 Comments:no complications

## 2014-10-21 NOTE — Progress Notes (Signed)
Occupational Therapy Cancellation Note  Pt in cath lab.  Will reattempt.  Lucille Passy, OTR/L [phone]

## 2014-10-21 NOTE — CV Procedure (Signed)
Procedures performed: Right and left heart catheterization and calculation of cardiac output and cardiac index by Fick. Left radial artery access for left heart catheterization and right femoral vein access was utilized for performing right heart catheterization.   Indication: Patient is a 58 year-old African-American female with diabetes, hyperlipidemia, hypertension,  Tobacco use disorder, morbid obesity obesity  Presenting with altered mental status due to prescription narcotic overdose. Patient also had acute on chronic diastolic heart failure, echocardiogram performed a few days ago had revealed severe pulmonary hypertension and CT scan of the chest performed to rule out very embolism had revealed markedly enlarged pulmonary artery and evidence of pulmonary hypertension. Due to her multiple cardiac studies factors and to evaluate pulmonary hypertension, left and right heart catheterization was performed for definitive diagnosis.   A 7 French right femoral venous sheath introduced into right femoral vein access. A 6.5S shaped [place] Swan-Ganz catheter was advanced with balloon inflated on the sheath under fluoroscopic guidance into first the right atrium followed by the right ventricle and into the pulmonary artery to pulmonary artery wedge position. Hemodynamics were obtained in a locations. After hemodynamics were completed, samples were taken for SaO2% measurement to be used in Fick /Index. The catheter was then pulled back the balloon down and then completely out of the body.   Left Heart Catheterization   First a left radial arterial access was obtained and a 6 French sheath was introduced into the left radial artery. Initially we had planned on performing left heart catheterization via left antecubital vein access, but because of inability to gain antecubital vein access, we utilized radial arterial access to decrease bleeding competition in a morbidly obese patient. A 5 [place] Tig 4 catheter was  advanced over standard J-wire into the ascending aorta and used to engage first the Left and Right Coronary Artery. Multiple cineangiographic views of the Left then Right Coronary Artery system(s) were performed. Same catheter was utilized to cross the aortic valve for measurement Left Ventricular Hemodynamics. Left ventriculography was then performed in the RAO projection. Hemodynamics were then resampled and the catheter pulled back across the aortic valve for measurement of "pullback" gradient. The catheter was then removed the body over wire. All exchanges were made over standard J wire.   Manual pressure will be held for achieving hemostasis in the right femoral venous access and TR band will be applied for the left radial arterial access site.  Procedural data:  RA pressure 7/6  Mean 6 mm mercury. RA saturation 79%.  RV pressure 38/1 and Right ventricular EDP 8 mm Hg. PA pressure 43/17 with a mean of 26  mm mercury. PA saturation 75%.  Pulmonary capillary wedge 9/14 with a mean of 10 mm Hg. Aortic saturation 94%.  Cardiac output was 14.68 with cardiac index of 6.33  by Fick.   Angiographic data   Left ventricle: was performed.  Left systolic shows normal ejection fraction of 60-65% without regional wall motion abnormality. No significant mitral regurgitation.   Right coronary artery: Smooth,very large and measures 6-7 mm in diameter, dominant and normal   Left main coronary artery: Normal. No stenosis. Very large vessel.   LAD: Large, smooth and normal. Gives origin to a moderate sized diagonal 1 which is smooth and normal.  It is a very large caliber vessel, measures at least 6-7 mm. There was no ectasia, the blood flow was excellent and brisk.  Circumflex coronary artery: Smooth normal, again very large caliber vessel.  Impression: Normal left heart catheterizaton  with normal coronary arteries. The coronary arteries are very large, smooth and normal without evidence of ectasia,  aneurysm formation or slow filling. Right heart catheterization reveals moderate pulmonary hypertension, supranormal cardiac output and cardiac index secondary to morbid obesity and body habitus. COPD also contributing to increase cardiac output and cardiac index. There was no evidence of significant intracardiac shunting.  Recommendation: Pulmonary hypertension is related to morbid obesity, COPD and was worsened with acute on chronic diastolic heart failure. Continued primary prevention is indicated with risk factor modification. She has chronic cor pulmonale due to underlying comorbidities. There is no situational primary pulmonary hypertension. Her large pulmonary artery that was evident by CT angiogram of the chest probably correlates with a large body surface area and also large coronary arteries.  A total of 40 mL of contrast was utilized for diagnostic angiography.

## 2014-10-21 NOTE — Plan of Care (Signed)
Problem: Phase I Progression Outcomes Goal: First antibiotic given within 6hrs of admit Outcome: Completed/Met Date Met:  10/21/14 Goal: Consider Infectious Disease Consult Outcome: Completed/Met Date Met:  10/21/14 Goal: Consider pulmonary consult Outcome: Completed/Met Date Met:  10/21/14 Goal: Code status addressed with pt/family Outcome: Completed/Met Date Met:  10/21/14 Goal: Initial discharge plan identified Outcome: Not Applicable Date Met:  10/21/14 Goal: Other Phase I Outcomes/Goals Outcome: Not Applicable Date Met:  10/21/14  Problem: Phase II Progression Outcomes Goal: Encourage coughing & deep breathing Outcome: Completed/Met Date Met:  10/21/14 Goal: Wean O2 if indicated Outcome: Completed/Met Date Met:  10/21/14 Goal: Pain controlled Outcome: Completed/Met Date Met:  10/21/14 Goal: Review culture results with MD if needed Outcome: Completed/Met Date Met:  10/21/14 Goal: Progress activity as tolerated unless otherwise ordered Outcome: Completed/Met Date Met:  10/21/14 Goal: Discharge plan established Outcome: Not Applicable Date Met:  10/21/14 Goal: Tolerating diet Outcome: Completed/Met Date Met:  10/21/14 Goal: If HF, initiate HF Core Reminder Form Outcome: Not Applicable Date Met:  10/21/14 Goal: Other Phase II Outcomes/Goals Outcome: Not Applicable Date Met:  10/21/14  Problem: Phase III Progression Outcomes Goal: O2 sats > or equal to 93% on room air Outcome: Completed/Met Date Met:  10/21/14 Goal: Pain controlled on oral analgesia Outcome: Completed/Met Date Met:  10/21/14 Goal: Activity at appropriate level-compared to baseline (UP IN CHAIR FOR HEMODIALYSIS)  Outcome: Completed/Met Date Met:  10/21/14 Goal: Tolerating diet Outcome: Completed/Met Date Met:  10/21/14 Goal: Convert IV antibiotics to PO Outcome: Completed/Met Date Met:  10/21/14 Goal: Discharge plan remains appropriate-arrangements made Outcome: Not Applicable Date Met:  10/21/14 Goal:  Other Phase III Outcomes/Goals Outcome: Not Applicable Date Met:  10/21/14  Problem: Consults Goal: Cardiac Cath Patient Education (See Patient Education module for education specifics.)  Outcome: Completed/Met Date Met:  10/21/14 Goal: Skin Care Protocol Initiated - if Braden Score 18 or less If consults are not indicated, leave blank or document N/A  Outcome: Completed/Met Date Met:  10/21/14 Goal: Tobacco Cessation referral if indicated Outcome: Completed/Met Date Met:  10/21/14 Goal: Nutrition Consult-if indicated Outcome: Not Applicable Date Met:  10/21/14 Goal: Diabetes Guidelines if Diabetic/Glucose > 140 If diabetic or lab glucose is > 140 mg/dl - Initiate Diabetes/Hyperglycemia Guidelines & Document Interventions  Outcome: Completed/Met Date Met:  10/21/14  Problem: Phase I Progression Outcomes Goal: Pain controlled with appropriate interventions Outcome: Completed/Met Date Met:  10/21/14 Goal: Initial discharge plan identified Outcome: Completed/Met Date Met:  10/21/14 Goal: Voiding-avoid urinary catheter unless indicated Outcome: Completed/Met Date Met:  10/21/14 Goal: Hemodynamically stable Outcome: Completed/Met Date Met:  10/21/14 Goal: Distal pulses equal to baseline Outcome: Completed/Met Date Met:  10/21/14 Goal: Vascular site scale level 0 - I Vascular Site Scale Level 0: No bruising/bleeding/hematoma Level I (Mild): Bruising/Ecchymosis, minimal bleeding/ooozing, palpable hematoma < 3 cm Level II (Moderate): Bleeding not affecting hemodynamic parameters, pseudoaneurysm, palpable hematoma > 3 cm Level III (Severe) Bleeding which affects hemodynamic parameters or retroperitoneal hemorrhage  Outcome: Completed/Met Date Met:  10/21/14 Goal: Post Cath/PCI return to appropriate Path Outcome: Completed/Met Date Met:  10/21/14 Goal: Other Phase I Outcomes/Goals Outcome: Not Applicable Date Met:  10/21/14     

## 2014-10-21 NOTE — Progress Notes (Signed)
[place] TEAM 1 - Stepdown/ICU TEAM Progress Note  Teresa Wiley LEX:[id number] DOB: 1956/11/25 DOA: 10/16/2014 PCP: Ellender Hose, MD  Admit HPI / Brief Narrative: 58 yo female w/ a history of asthma; hypertension; degenerative disc disease w/ chronic back pain; reflux; diabetes mellitus; and morbid obesity who presented with shortness of breath and confusion. On arrival patient was noted to be hypoxic to 40%. CXR showed evidence of fluid overload and possible infiltrate. Patient was noted to be slightly hypotensive 90/60. WBC elevated to 11.9 with low grade fever up to 99.6.   HPI/Subjective: Pt is alert.  She has no new complaints.  She denies cp, or current sob.    Assessment/Plan:  Acute encephalopathy likely from combined hypoxic and hypercarbic resp failure + narcotics + benzos - CT head negative - approaching baseline mental status   Hypoxic hypercarbic acute respiratory failure  obesity, possible COPD (smoker), untreated OSA, diastolic HF - PCCM following - counseled pt at length on need for no smoking, weight loss, minimization of sedating meds, and need for probable home O2   Acute on chronic diastolic heart failure Currently well diuresed and not in any acute decompensated heart failure  Elevated d-dimer Pt at high risk for PE given obesity but CT angio of chest was unrevealing - dopplers were negative for DVT   Severe Pulmonary HTN multi factorial:  obesity, OSA, heart failure - cont O2 therapy - no evidence on RHC to suggest primary pulmonary HTN - cont CCB - continue diuresis - consider Cards eval for further tx options if Pulm not able to follow this   ?LLL HCAP F/u CXR makes this appear less likely - no fever or leukocytosis - procalcitonin negative - now off abx   Chronic pain w/ narcotic/benzo dependence Dr. Sherral Hammers has adjusted tx regimen - today pt is complaining that her pain is not well controlled - have increased oxycodone to 5-10mg  prn, but not changed  frequency - agree that high dose narcotics in this pt w/ probable OHS/SA and signif hypoxic resp failure is simply too dangerous - have counseled pt extensively that I will NOT Craig further as I feel this would be dangerous - cymbalta initiated - pt made no attempt to have dosing increased today  Transient low grade hemoptysis  CT negative for PE - sx have resolved   Tobacco abuse  Counseled at length on absolute need to stop smoking   Hypotension  Resolved  HTN presently very poorly controlled - adjust medical tx and follow   DDD w/ chronic back pain  See discussion above  Mild transaminitis  Recheck in AM   DM CBGs reasonably controlled - follow w/o change today - A1c 6.8  Morbid obesity - Body mass index is 38.98 kg/(m^2). Discussed link between obesity and her current illness - discussed need for signif lasting weight loss  Code Status: FULL Family Communication: wife present at bedside Disposition Plan: stable for transfer to tele bed - ambulate w/ PT/OT - probable d/c home in next 24-48hrs if BP can be controlled   Consultants: PCCM  Procedures: TTE - 11/23 - EF 60% - no WMA - PA pressure 34mm Hg B Venous duplex - 11/23 - no DVT or SVT L&R Heart Cath 11/27 - normal coronary arteries - Right heart catheterization reveals moderate pulmonary hypertension, supranormal cardiac output and cardiac index secondary to morbid obesity and body habitus  Antibiotics: Zosyn 10/16/14 >10/18/14  DVT prophylaxis: SQ heparin   Objective: Blood pressure  189/90, pulse 102, temperature 99.3 F (37.4 C), temperature source Oral, resp. rate 18, height 5\' 9"  (1.753 m), weight 119.8 kg (264 lb 1.8 oz), SpO2 93 %.  Intake/Output Summary (Last 24 hours) at 10/21/14 1657 Last data filed at 10/21/14 1056  Gross per 24 hour  Intake    306 ml  Output    200 ml  Net    106 ml   Exam: General: No acute respiratory distress in bed on no oxygen  Lungs: Clear to  auscultation bilaterally without wheezes or crackles - very distant BS th/o  Cardiovascular: Regular rate and rhythm without murmur gallop or rub - distant HS  Abdomen: Nontender, obese, soft, bowel sounds positive, no rebound, no ascites, no appreciable mass Extremities: No significant cyanosis, clubbing;  Trace edema bilateral lower extremities  Data Reviewed: Basic Metabolic Panel:  Recent Labs Lab 10/16/14 2103 10/17/14 0540 10/18/14 0235 10/20/14 0549  NA 141 141 141 142  K 4.5 4.1 3.8 3.8  CL 102 99 97 98  CO2 25 26 31 30   GLUCOSE 128* 114* 111* 164*  BUN 14 13 9 18   CREATININE 1.11* 1.00 0.81 0.83  CALCIUM 8.7 8.9 8.9 9.6  MG  --  2.1  --   --     Liver Function Tests:  Recent Labs Lab 10/16/14 2103 10/17/14 0540 10/18/14 0235 10/20/14 0549  AST 12 11 12  45*  ALT 12 12 12  49*  ALKPHOS 154* 153* 149* 150*  BILITOT 0.7 0.6 0.7 0.6  PROT 7.7 8.0 7.7 8.4*  ALBUMIN 3.1* 3.4* 3.2* 3.8   Coags:  Recent Labs Lab 10/16/14 2103 10/17/14 0540  INR 1.01 0.97   CBC:  Recent Labs Lab 10/16/14 2103 10/17/14 0540 10/18/14 0235  WBC 11.9* 11.5* 9.0  NEUTROABS  --  7.7  --   HGB 10.5* 10.8* 10.3*  HCT 36.3 35.9* 35.7*  MCV 87.3 86.7 89.0  PLT 381 373 356    Cardiac Enzymes:  Recent Labs Lab 10/17/14 0540 10/17/14 0654 10/17/14 1614  TROPONINI <0.30 <0.30 <0.30   BNP (last 3 results)  Recent Labs  10/16/14 2245 10/17/14 0540  PROBNP 1935.0* 1221.0*    CBG:  Recent Labs Lab 10/21/14 0027 10/21/14 0425 10/21/14 0819 10/21/14 1201 10/21/14 1412  GLUCAP 170* 213* 215* 192* 169*    Recent Results (from the past 240 hour(s))  Culture, blood (routine x 2)     Status: None (Preliminary result)   Collection Time: 10/16/14 10:41 PM  Result Value Ref Range Status   Specimen Description BLOOD RIGHT HAND  Final   Special Requests BOTTLES DRAWN AEROBIC AND ANAEROBIC 5CC EACH  Final   Culture  Setup Time   Final    10/17/2014 10:13 Performed  at [facility]    Culture   Final           BLOOD CULTURE RECEIVED NO GROWTH TO DATE CULTURE WILL BE HELD FOR 5 DAYS BEFORE ISSUING A FINAL NEGATIVE REPORT Performed at [facility]    Report Status PENDING  Incomplete  Culture, blood (routine x 2)     Status: None (Preliminary result)   Collection Time: 10/16/14 10:43 PM  Result Value Ref Range Status   Specimen Description BLOOD LEFT HAND  Final   Special Requests BOTTLES DRAWN AEROBIC AND ANAEROBIC 5CC  Final   Culture  Setup Time   Final    10/17/2014 10:13 Performed at [facility]   Final  BLOOD CULTURE RECEIVED NO GROWTH TO DATE CULTURE WILL BE HELD FOR 5 DAYS BEFORE ISSUING A FINAL NEGATIVE REPORT Performed at [facility]    Report Status PENDING  Incomplete  MRSA PCR Screening     Status: None   Collection Time: 10/17/14  2:41 PM  Result Value Ref Range Status   MRSA by PCR NEGATIVE NEGATIVE Final    Comment:        The GeneXpert MRSA Assay (FDA approved for NASAL specimens only), is one component of a comprehensive MRSA colonization surveillance program. It is not intended to diagnose MRSA infection nor to guide or monitor treatment for MRSA infections.   Culture, sputum-assessment     Status: None   Collection Time: 10/19/14  7:53 AM  Result Value Ref Range Status   Specimen Description SPUTUM  Final   Special Requests Normal  Final   Sputum evaluation   Final    MICROSCOPIC FINDINGS SUGGEST THAT THIS SPECIMEN IS NOT REPRESENTATIVE OF LOWER RESPIRATORY SECRETIONS. PLEASE RECOLLECT. Gram Stain Report Called to,Read Back By and Verified With: Jannette Spanner AT [phone] 10/19/14 BY K BARR    Report Status 10/19/2014 FINAL  Final  Gram stain     Status: None   Collection Time: 10/19/14  7:54 AM  Result Value Ref Range Status   Specimen Description SPUTUM  Final   Special Requests Normal  Final   Gram Stain   Final    SQUAMOUS EPITHELIAL CELLS PRESENT FEW  GRAM POSITIVE COCCI IN PAIRS IN CHAINS FEW GRAM POSITIVE COCCI IN PAIRS IN CLUSTERS RARE YEAST    Report Status 10/19/2014 FINAL  Final     Studies:  Recent x-ray studies have been reviewed in detail by the Attending Physician  Scheduled Meds:  Scheduled Meds: . alum & mag hydroxide-simeth  30 mL Oral Once  . atorvastatin  10 mg Oral Daily  . diltiazem  30 mg Oral 3 times per day  . DULoxetine  30 mg Oral Daily  . furosemide  20 mg Intravenous BID  . gabapentin  300 mg Oral TID  . guaiFENesin  600 mg Oral BID  . heparin subcutaneous  5,000 Units Subcutaneous 3 times per day  . insulin aspart  0-15 Units Subcutaneous TID WC  . LORazepam  1 mg Oral BID  . metFORMIN  500 mg Oral BID WC  . mometasone-formoterol  2 puff Inhalation BID  . OxyCODONE  40 mg Oral Q12H  . pantoprazole  40 mg Oral Daily  . sodium chloride  3 mL Intravenous Q12H  . tiotropium  18 mcg Inhalation Daily    Time spent on care of this patient: 35 mins   [name] T , MD   [facility] Office  [phone] Pager - Text Page per Shea Evans as per below:  On-Call/Text Page:      [email or web address]      password [email or web address]  If 7PM-7AM, please contact night-coverage [email or web address] Password [email or web address] 10/21/2014, 4:57 PM   LOS: 5 days
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
## 2014-10-21 NOTE — Progress Notes (Signed)
TR BAND REMOVAL  LOCATION:    left radial  DEFLATED PER PROTOCOL:    Yes.    TIME BAND OFF / DRESSING APPLIED:    1630   SITE UPON ARRIVAL:    Level 0  SITE AFTER BAND REMOVAL:    Level 0  REVERSE ALLEN'S TEST:     positive  CIRCULATION SENSATION AND MOVEMENT:    Within Normal Limits   Yes.    COMMENTS:   Patient tolerated well. Pressure dressing applied.

## 2014-10-22 DIAGNOSIS — E1165 Type 2 diabetes mellitus with hyperglycemia: Secondary | ICD-10-CM

## 2014-10-22 LAB — GLUCOSE, CAPILLARY
GLUCOSE-CAPILLARY: 191 mg/dL — AB (ref 70–99)
Glucose-Capillary: 173 mg/dL — ABNORMAL HIGH (ref 70–99)
Glucose-Capillary: 173 mg/dL — ABNORMAL HIGH (ref 70–99)
Glucose-Capillary: 161 mg/dL — ABNORMAL HIGH (ref 70–99)

## 2014-10-22 LAB — COMPREHENSIVE METABOLIC PANEL
ALBUMIN: 3.7 g/dL (ref 3.5–5.2)
ALK PHOS: 130 U/L — AB (ref 39–117)
ALT: 61 U/L — ABNORMAL HIGH (ref 0–35)
AST: 33 U/L (ref 0–37)
Anion gap: 13 (ref 5–15)
BUN: 19 mg/dL (ref 6–23)
CALCIUM: 9.4 mg/dL (ref 8.4–10.5)
CO2: 29 mEq/L (ref 19–32)
Chloride: 97 mEq/L (ref 96–112)
Creatinine, Ser: 0.77 mg/dL (ref 0.50–1.10)
GFR calc non Af Amer: >90 mL/min (ref >90)
Glucose, Bld: 162 mg/dL — ABNORMAL HIGH (ref 70–99)
POTASSIUM: 3.2 meq/L — AB (ref 3.7–5.3)
SODIUM: 139 meq/L (ref 137–147)
TOTAL PROTEIN: 7.8 g/dL (ref 6.0–8.3)
Total Bilirubin: 0.6 mg/dL (ref 0.3–1.2)

## 2014-10-22 LAB — CBC
HCT: 38.2 % (ref 36.0–46.0)
HEMOGLOBIN: 11.7 g/dL — AB (ref 12.0–15.0)
MCH: 25.1 pg — ABNORMAL LOW (ref 26.0–34.0)
MCHC: 30.6 g/dL (ref 30.0–36.0)
MCV: 82 fL (ref 78.0–100.0)
Platelets: 389 10*3/uL (ref 150–400)
RBC: 4.66 MIL/uL (ref 3.87–5.11)
RDW: 15.3 % (ref 11.5–15.5)
WBC: 12.1 10*3/uL — ABNORMAL HIGH (ref 4.0–10.5)

## 2014-10-22 MED ORDER — FUROSEMIDE 20 MG PO TABS
20.0000 mg | ORAL_TABLET | Freq: Two times a day (BID) | ORAL | Status: DC
  Administered 2014-10-22 – 2014-10-24 (×4): 20 mg via ORAL
  Filled 2014-10-22 (×6): qty 1

## 2014-10-22 MED ORDER — POTASSIUM CHLORIDE CRYS ER 20 MEQ PO TBCR
40.0000 meq | EXTENDED_RELEASE_TABLET | Freq: Once | ORAL | Status: AC
  Administered 2014-10-22: 40 meq via ORAL
  Filled 2014-10-22: qty 2

## 2014-10-22 MED ORDER — DILTIAZEM HCL ER COATED BEADS 240 MG PO CP24
240.0000 mg | ORAL_CAPSULE | Freq: Every day | ORAL | Status: DC
  Filled 2014-10-22 (×2): qty 1

## 2014-10-22 MED ORDER — LIVING WELL WITH DIABETES BOOK
Freq: Once | Status: AC
  Administered 2014-10-22
  Filled 2014-10-22: qty 1

## 2014-10-22 MED ORDER — METFORMIN HCL 500 MG PO TABS
500.0000 mg | ORAL_TABLET | Freq: Two times a day (BID) | ORAL | Status: DC
  Administered 2014-10-23 – 2014-10-24 (×3): 500 mg via ORAL
  Filled 2014-10-22 (×6): qty 1

## 2014-10-22 NOTE — Progress Notes (Signed)
Subjective:  Remains slightly confused. Didn't remember having cardiac catheterization yesterday. Otherwise, denies pain or bleeding complications at access sites.  Objective:  Vital Signs in the last 24 hours: Temp:  [98 F (36.7 C)-99.3 F (37.4 C)] 98 F (36.7 C) (11/28 0742) Pulse Rate:  [79-102] 99 (11/28 0742) Resp:  [13-27] 18 (11/28 0742) BP: (117-212)/(63-97) 147/73 mmHg (11/28 0742) SpO2:  [90 %-100 %] 95 % (11/28 0742) Weight:  [114.8 kg (253 lb 1.4 oz)] 114.8 kg (253 lb 1.4 oz) (11/28 0339)  Intake/Output from previous day: 11/27 0701 - 11/28 0700 In: 1021.2 [P.O.:300; I.V.:721.2] Out: 1200 [Urine:1200]  Physical Exam:   General appearance: alert, cooperative, no distress and morbidly obese Eyes: conjunctivae/corneas clear. PERRL, EOM's intact. Fundi benign. Neck: no adenopathy, no carotid bruit, no JVD, supple, symmetrical, trachea midline and thyroid not enlarged, symmetric, no tenderness/mass/nodules Neck: JVP - normal, carotids 2+= without bruits Resp: mild expiratory wheezes bilaterally Chest wall: no tenderness Cardio: regular rate and rhythm, S1, S2 normal, no murmur, click, rub or gallop GI: soft, non-tender; bowel sounds normal; no masses,  no organomegaly Extremities: extremities normal, atraumatic, no cyanosis or edema and no bleeding or hematoma noted at left radial access site or right femoral access site, radial and femoral pulses 2+ and equal bilaterally    Lab Results: BMP  Recent Labs  10/18/14 0235 10/20/14 0549 10/22/14 0430  NA 141 142 139  K 3.8 3.8 3.2*  CL 97 98 97  CO2 31 30 29   GLUCOSE 111* 164* 162*  BUN 9 18 19   CREATININE 0.81 0.83 0.77  CALCIUM 8.9 9.6 9.4  GFRNONAA 79* 77* >90  GFRAA >90 89* >90    CBC  Recent Labs Lab 10/17/14 0540  10/22/14 0430  WBC 11.5*  < > 12.1*  RBC 4.14  < > 4.66  HGB 10.8*  < > 11.7*  HCT 35.9*  < > 38.2  PLT 373  < > 389  MCV 86.7  < > 82.0  MCH 26.1  < > 25.1*  MCHC 30.1  < >  30.6  RDW 15.8*  < > 15.3  LYMPHSABS 2.6  --   --   MONOABS 1.2*  --   --   EOSABS 0.1  --   --   BASOSABS 0.0  --   --   < > = values in this interval not displayed.  HEMOGLOBIN A1C Lab Results  Component Value Date   HGBA1C 6.8* 10/17/2014   MPG 148* 10/17/2014    Cardiac Panel (last 3 results)  Recent Labs  10/17/14 0540 10/17/14 0654 10/17/14 1614  TROPONINI <0.30 <0.30 <0.30    BNP (last 3 results)  Recent Labs  10/16/14 2245 10/17/14 0540  PROBNP 1935.0* 1221.0*    TSH  Recent Labs  07/19/14 0635 10/17/14 0540  TSH 0.373 0.569    CHOLESTEROL  Recent Labs  07/19/14 0635  CHOL 107    Hepatic Function Panel  Recent Labs  10/18/14 0235 10/20/14 0549 10/22/14 0430  PROT 7.7 8.4* 7.8  ALBUMIN 3.2* 3.8 3.7  AST 12 45* 33  ALT 12 49* 61*  ALKPHOS 149* 150* 130*  BILITOT 0.7 0.6 0.6    Imaging: No results found.  Cardiac Studies:  EKG: normal EKG, normal sinus rhythm, unchanged from previous tracings.   Assessment/Plan:  1. Moderate pulmonary hypertension secondary. 2. Morbid obesity 3. COPD 4. Hypertension  Rec: Normal coronary arteries. Access site has healed well. All questions answered to the patient and  discussed smoking cessation. Will see her PRN. Continue primary prevention.   Rachel Bo, NP-C 10/22/2014, 8:13 AM   I personally supervised Ms Ebony Hail. Adrian Prows,  MD [facility], [place] Pager: [phone] Office: [phone] If no answer: [phone]

## 2014-10-22 NOTE — Progress Notes (Signed)
[place] TEAM 1 - Stepdown/ICU TEAM Progress Note  Jocilyn Trego OVF:[id number] DOB: [birth year] DOA: 10/16/2014 PCP: Ellender Hose, MD  Admit HPI / Brief Narrative: 58 yo female w/ a history of asthma; hypertension; degenerative disc disease w/ chronic back pain; reflux; diabetes mellitus; and morbid obesity who presented with shortness of breath and confusion. On arrival patient was noted to be hypoxic to 40%. CXR showed evidence of fluid overload and possible infiltrate. Patient was noted to be slightly hypotensive 90/60. WBC elevated to 11.9 with low grade fever up to 99.6.   HPI/Subjective: Pt has no new complaints today.  She is quite anxious about her new medication regimen and wether she will know what to take after she is d/c.  She ambulated w/ PT today and did not require O2 to keep sats >90%.    Assessment/Plan:  Acute encephalopathy likely from combined hypoxic and hypercarbic resp failure + narcotics + benzos - CT head negative - approaching baseline mental status   Hypoxic hypercarbic acute respiratory failure  obesity, possible COPD (smoker), untreated OSA, diastolic HF - PCCM following - counseled pt at length on need for no smoking, weight loss, minimization of sedating meds - suprisingly the patient's ambulatory sats are 90% or > and she will therefore not need home O2  Acute on chronic diastolic heart failure Currently well diuresed and not in any acute decompensated heart failure  Elevated d-dimer Pt at high risk for PE given obesity but CT angio of chest was unrevealing - dopplers were negative for DVT   Moderate Pulmonary HTN multi factorial:  obesity, OSA, heart failure - no evidence on RHC to suggest primary pulmonary HTN - cont CCB - continue diuresis - Cards eval complete - with diuresis sats have improved to point that home O2 not currently required   ?LLL HCAP F/u CXR makes this appear less likely - no fever or leukocytosis - procalcitonin negative -  now off abx   Chronic pain w/ narcotic/benzo dependence Dr. Sherral Hammers has adjusted tx regimen - agree that high dose narcotics in this pt w/ probable OHS/SA and signif hypoxic resp failure is simply too dangerous - have counseled pt extensively that I will NOT INCREASE HER NARCOTICS OR BENZOS further as I feel this would be dangerous - cymbalta initiated - pt made no attempt to have dosing increased again today  Transient low grade hemoptysis  CT negative for PE - sx have resolved   Tobacco abuse  Counseled at length on absolute need to stop smoking   Hypotension  Resolved  HTN Adjustments made in med tx yesterday - BP improved today but not yet at goal - follow w/o change today   DDD w/ chronic back pain  See discussion above  Mild transaminitis  Resolving - likely passive hepatic congestion due to Pulm HTN/RH failure   DM CBGs not quite at goal - adjust tx and follow trend - A1c 6.8  Morbid obesity - Body mass index is 36.56 kg/(m^2). Discussed link between obesity and her current illness - discussed need for signif lasting weight loss  Code Status: FULL Family Communication: wife present at bedside Disposition Plan: stable for transfer to tele bed - ambulate w/ PT/OT - probable d/c home in AM 11/29  Consultants: PCCM Cardiology - Einar Gip  Procedures: TTE - 11/23 - EF 60% - no WMA - PA pressure 32mm Hg B Venous duplex - 11/23 - no DVT or SVT L&R Heart Cath 11/27 - normal coronary arteries - Right heart  catheterization reveals moderate pulmonary hypertension, supranormal cardiac output and cardiac index secondary to morbid obesity and body habitus  Antibiotics: Zosyn 10/16/14 >10/18/14  DVT prophylaxis: SQ heparin   Objective: Blood pressure 149/92, pulse 98, temperature 98.9 F (37.2 C), temperature source Oral, resp. rate 16, height 5' 9.75" (1.772 m), weight 114.8 kg (253 lb 1.4 oz), SpO2 93 %.  Intake/Output Summary (Last 24 hours) at 10/22/14 1150 Last data filed  at 10/22/14 0743  Gross per 24 hour  Intake  921.2 ml  Output   1000 ml  Net  -78.8 ml   Exam: General: No acute respiratory distress in bed on no oxygen  Lungs: Clear to auscultation bilaterally without wheezes or crackles - distant BS th/o  Cardiovascular: Regular rate and rhythm without murmur gallop or rub - distant HS  Abdomen: Nontender, obese, soft, bowel sounds positive, no rebound, no ascites, no appreciable mass Extremities: No significant cyanosis, clubbing, edema bilateral lower extremities  Data Reviewed: Basic Metabolic Panel:  Recent Labs Lab 10/16/14 2103 10/17/14 0540 10/18/14 0235 10/20/14 0549 10/22/14 0430  NA 141 141 141 142 139  K 4.5 4.1 3.8 3.8 3.2*  CL 102 99 97 98 97  CO2 25 26 31 30 29   GLUCOSE 128* 114* 111* 164* 162*  BUN 14 13 9 18 19   CREATININE 1.11* 1.00 0.81 0.83 0.77  CALCIUM 8.7 8.9 8.9 9.6 9.4  MG  --  2.1  --   --   --     Liver Function Tests:  Recent Labs Lab 10/16/14 2103 10/17/14 0540 10/18/14 0235 10/20/14 0549 10/22/14 0430  AST 12 11 12  45* 33  ALT 12 12 12  49* 61*  ALKPHOS 154* 153* 149* 150* 130*  BILITOT 0.7 0.6 0.7 0.6 0.6  PROT 7.7 8.0 7.7 8.4* 7.8  ALBUMIN 3.1* 3.4* 3.2* 3.8 3.7   Coags:  Recent Labs Lab 10/16/14 2103 10/17/14 0540  INR 1.01 0.97   CBC:  Recent Labs Lab 10/16/14 2103 10/17/14 0540 10/18/14 0235 10/22/14 0430  WBC 11.9* 11.5* 9.0 12.1*  NEUTROABS  --  7.7  --   --   HGB 10.5* 10.8* 10.3* 11.7*  HCT 36.3 35.9* 35.7* 38.2  MCV 87.3 86.7 89.0 82.0  PLT 381 373 356 389    Cardiac Enzymes:  Recent Labs Lab 10/17/14 0540 10/17/14 0654 10/17/14 1614  TROPONINI <0.30 <0.30 <0.30   BNP (last 3 results)  Recent Labs  10/16/14 2245 10/17/14 0540  PROBNP 1935.0* 1221.0*    CBG:  Recent Labs Lab 10/21/14 1201 10/21/14 1412 10/21/14 1720 10/21/14 2107 10/22/14 0659  GLUCAP 192* 169* 190* 168* 191*    Recent Results (from the past 240 hour(s))  Culture, blood  (routine x 2)     Status: None (Preliminary result)   Collection Time: 10/16/14 10:41 PM  Result Value Ref Range Status   Specimen Description BLOOD RIGHT HAND  Final   Special Requests BOTTLES DRAWN AEROBIC AND ANAEROBIC 5CC EACH  Final   Culture  Setup Time   Final    10/17/2014 10:13 Performed at [facility]    Culture   Final           BLOOD CULTURE RECEIVED NO GROWTH TO DATE CULTURE WILL BE HELD FOR 5 DAYS BEFORE ISSUING A FINAL NEGATIVE REPORT Performed at [facility]    Report Status PENDING  Incomplete  Culture, blood (routine x 2)     Status: None (Preliminary result)   Collection Time: 10/16/14  10:43 PM  Result Value Ref Range Status   Specimen Description BLOOD LEFT HAND  Final   Special Requests BOTTLES DRAWN AEROBIC AND ANAEROBIC 5CC  Final   Culture  Setup Time   Final    10/17/2014 10:13 Performed at [facility]    Culture   Final           BLOOD CULTURE RECEIVED NO GROWTH TO DATE CULTURE WILL BE HELD FOR 5 DAYS BEFORE ISSUING A FINAL NEGATIVE REPORT Performed at [facility]    Report Status PENDING  Incomplete  MRSA PCR Screening     Status: None   Collection Time: 10/17/14  2:41 PM  Result Value Ref Range Status   MRSA by PCR NEGATIVE NEGATIVE Final    Comment:        The GeneXpert MRSA Assay (FDA approved for NASAL specimens only), is one component of a comprehensive MRSA colonization surveillance program. It is not intended to diagnose MRSA infection nor to guide or monitor treatment for MRSA infections.   Culture, sputum-assessment     Status: None   Collection Time: 10/19/14  7:53 AM  Result Value Ref Range Status   Specimen Description SPUTUM  Final   Special Requests Normal  Final   Sputum evaluation   Final    MICROSCOPIC FINDINGS SUGGEST THAT THIS SPECIMEN IS NOT REPRESENTATIVE OF LOWER RESPIRATORY SECRETIONS. PLEASE RECOLLECT. Gram Stain Report Called to,Read Back By and Verified With: Jannette Spanner AT  [phone] 10/19/14 BY K BARR    Report Status 10/19/2014 FINAL  Final  Gram stain     Status: None   Collection Time: 10/19/14  7:54 AM  Result Value Ref Range Status   Specimen Description SPUTUM  Final   Special Requests Normal  Final   Gram Stain   Final    SQUAMOUS EPITHELIAL CELLS PRESENT FEW GRAM POSITIVE COCCI IN PAIRS IN CHAINS FEW GRAM POSITIVE COCCI IN PAIRS IN CLUSTERS RARE YEAST    Report Status 10/19/2014 FINAL  Final     Studies:  Recent x-ray studies have been reviewed in detail by the Attending Physician  Scheduled Meds:  Scheduled Meds: . alum & mag hydroxide-simeth  30 mL Oral Once  . atorvastatin  10 mg Oral Daily  . diltiazem  60 mg Oral 3 times per day  . DULoxetine  30 mg Oral Daily  . furosemide  20 mg Intravenous BID  . gabapentin  300 mg Oral TID  . guaiFENesin  600 mg Oral BID  . heparin subcutaneous  5,000 Units Subcutaneous 3 times per day  . insulin aspart  0-15 Units Subcutaneous TID WC  . LORazepam  1 mg Oral BID  . mometasone-formoterol  2 puff Inhalation BID  . OxyCODONE  40 mg Oral Q12H  . pantoprazole  40 mg Oral Daily  . tiotropium  18 mcg Inhalation Daily    Time spent on care of this patient: 35 mins   MCCLUNG,JEFFREY T , MD   [facility] Office  [phone] Pager - Text Page per Shea Evans as per below:  On-Call/Text Page:      [email or web address]      password [email or web address]  If 7PM-7AM, please contact night-coverage [email or web address] Password [email or web address] 10/22/2014, 11:50 AM   LOS: 6 days

## 2014-10-22 NOTE — Evaluation (Addendum)
Physical Therapy Evaluation Patient Details Name: Teresa Wiley MRN: [id number] DOB: 02/17/1956 Today's Date: 10/22/2014   History of Present Illness  58 yo female w/ a history of asthma; hypertension; degenerative disc disease w/ chronic back pain; reflux; diabetes mellitus; and morbid obesity who presented with shortness of breath and confusion. Pt with acute encephalopathy, respiratory failure, and pulmonary hypertension.  Clinical Impression  Pt doing well with mobility. Has a walker at home. Will follow acutely while in hospital but no PT follow-up after dc. SaO2 with amb on RA 90-96%. HR 120's with activity.    Follow Up Recommendations No PT follow up    Equipment Recommendations  None recommended by PT    Recommendations for Other Services       Precautions / Restrictions Precautions Precautions: Fall      Mobility  Bed Mobility Overal bed mobility: Modified Independent                Transfers Overall transfer level: Modified independent Equipment used: None Transfers: Sit to/from Stand Sit to Stand: Modified independent (Device/Increase time)            Ambulation/Gait Ambulation/Gait assistance: Min guard;Supervision Ambulation Distance (Feet): 250 Feet Assistive device: Rolling walker (2 wheeled);None Gait Pattern/deviations: Step-through pattern;Wide base of support   Gait velocity interpretation: Below normal speed for age/gender General Gait Details: Pt slightly unsteady without assistive device and reaching for furniture or walls. Pt much steadier using rolling walker.  Stairs            Wheelchair Mobility    Modified Rankin (Stroke Patients Only)       Balance Overall balance assessment: Needs assistance Sitting-balance support: No upper extremity supported;Feet supported Sitting balance-Leahy Scale: Normal     Standing balance support: No upper extremity supported Standing balance-Leahy Scale: Good                                Pertinent Vitals/Pain Pain Assessment: Faces Faces Pain Scale: No hurt    Home Living Family/patient expects to be discharged to:: Private residence Living Arrangements: Spouse/significant other;Children Available Help at Discharge: Family;Available PRN/intermittently Type of Home: House Home Access: Stairs to enter Entrance Stairs-Rails: Right;Left;Can reach both Entrance Stairs-Number of Steps: 3 Home Layout: Two level Home Equipment: Environmental consultant - 2 wheels      Prior Function Level of Independence: Independent               Hand Dominance   Dominant Hand: Right    Extremity/Trunk Assessment   Upper Extremity Assessment: Defer to OT evaluation           Lower Extremity Assessment: Overall WFL for tasks assessed         Communication   Communication: No difficulties  Cognition Arousal/Alertness: Awake/alert Behavior During Therapy: WFL for tasks assessed/performed Overall Cognitive Status: Impaired/Different from baseline       Memory: Decreased short-term memory              General Comments      Exercises        Assessment/Plan    PT Assessment Patient needs continued PT services  PT Diagnosis Difficulty walking   PT Problem List Decreased activity tolerance;Decreased balance;Decreased mobility;Obesity  PT Treatment Interventions DME instruction;Gait training;Functional mobility training;Therapeutic activities;Therapeutic exercise;Balance training;Patient/family education   PT Goals (Current goals can be found in the Care Plan section) Acute Rehab PT Goals Patient Stated Goal: Go home PT  Goal Formulation: With patient Time For Goal Achievement: 10/29/14 Potential to Achieve Goals: Good    Frequency Min 3X/week   Barriers to discharge        Co-evaluation               End of Session   Activity Tolerance: Patient tolerated treatment well Patient left: in bed;with call bell/phone within reach;with  family/visitor present (sitting EOB with respiratory therapist present.) Nurse Communication: Mobility status         Time: 5188-4166 PT Time Calculation (min) (ACUTE ONLY): 20 min   Charges:   PT Evaluation $Initial PT Evaluation Tier I: 1 Procedure PT Treatments $Gait Training: 8-22 mins   PT G Codes:          Teresa Wiley 11/06/2014, 9:48 AM  [facility] PT [phone]

## 2014-10-22 NOTE — Progress Notes (Signed)
SATURATION QUALIFICATIONS: (This note is used to comply with regulatory documentation for home oxygen)  Patient Saturations on Room Air at Rest = 94%  Patient Saturations on Room Air while Ambulating = 90-96%  Patient Saturations on  Liters of oxygen while Ambulating = N/A   Please briefly explain why patient needs home oxygen:  [facility] PT [phone]

## 2014-10-23 LAB — CULTURE, BLOOD (ROUTINE X 2)
Culture: NO GROWTH
Culture: NO GROWTH

## 2014-10-23 LAB — BASIC METABOLIC PANEL
ANION GAP: 16 — AB (ref 5–15)
BUN: 17 mg/dL (ref 6–23)
CALCIUM: 9.4 mg/dL (ref 8.4–10.5)
CO2: 27 meq/L (ref 19–32)
Chloride: 98 mEq/L (ref 96–112)
Creatinine, Ser: 0.78 mg/dL (ref 0.50–1.10)
GFR calc Af Amer: >90 mL/min (ref >90)
GLUCOSE: 171 mg/dL — AB (ref 70–99)
Potassium: 3 mEq/L — ABNORMAL LOW (ref 3.7–5.3)
Sodium: 141 mEq/L (ref 137–147)

## 2014-10-23 LAB — GLUCOSE, CAPILLARY
GLUCOSE-CAPILLARY: 134 mg/dL — AB (ref 70–99)
GLUCOSE-CAPILLARY: 149 mg/dL — AB (ref 70–99)
Glucose-Capillary: 143 mg/dL — ABNORMAL HIGH (ref 70–99)
Glucose-Capillary: 134 mg/dL — ABNORMAL HIGH (ref 70–99)

## 2014-10-23 LAB — CBC
HEMATOCRIT: 38.3 % (ref 36.0–46.0)
Hemoglobin: 11.7 g/dL — ABNORMAL LOW (ref 12.0–15.0)
MCH: 25.3 pg — ABNORMAL LOW (ref 26.0–34.0)
MCHC: 30.5 g/dL (ref 30.0–36.0)
MCV: 82.7 fL (ref 78.0–100.0)
PLATELETS: 403 10*3/uL — AB (ref 150–400)
RBC: 4.63 MIL/uL (ref 3.87–5.11)
RDW: 15.6 % — AB (ref 11.5–15.5)
WBC: 10.5 10*3/uL (ref 4.0–10.5)

## 2014-10-23 MED ORDER — DILTIAZEM HCL ER COATED BEADS 300 MG PO CP24
300.0000 mg | ORAL_CAPSULE | Freq: Every day | ORAL | Status: DC
  Administered 2014-10-24: 300 mg via ORAL
  Filled 2014-10-23: qty 1

## 2014-10-23 MED ORDER — POTASSIUM CHLORIDE CRYS ER 20 MEQ PO TBCR
40.0000 meq | EXTENDED_RELEASE_TABLET | Freq: Once | ORAL | Status: DC
  Administered 2014-10-23: 40 meq via ORAL

## 2014-10-23 MED ORDER — LISINOPRIL 5 MG PO TABS
5.0000 mg | ORAL_TABLET | Freq: Every day | ORAL | Status: DC
  Administered 2014-10-23 – 2014-10-24 (×2): 5 mg via ORAL
  Filled 2014-10-23 (×2): qty 1

## 2014-10-23 MED ORDER — POTASSIUM CHLORIDE CRYS ER 20 MEQ PO TBCR: 20.0000 meq | EXTENDED_RELEASE_TABLET | Freq: Two times a day (BID) | ORAL | Status: DC

## 2014-10-23 MED ORDER — SIMETHICONE 80 MG PO CHEW
160.0000 mg | CHEWABLE_TABLET | Freq: Once | ORAL | Status: AC
  Administered 2014-10-23: 160 mg via ORAL
  Filled 2014-10-23: qty 2

## 2014-10-23 MED ORDER — POTASSIUM CHLORIDE CRYS ER 20 MEQ PO TBCR
40.0000 meq | EXTENDED_RELEASE_TABLET | Freq: Two times a day (BID) | ORAL | Status: AC
  Administered 2014-10-23 – 2014-10-24 (×3): 40 meq via ORAL
  Filled 2014-10-23 (×4): qty 2

## 2014-10-23 NOTE — Progress Notes (Signed)
[place] TEAM 1 - Stepdown/ICU TEAM Progress Note  Teresa Wiley GQQ:[id number] DOB: [birth year] DOA: 10/16/2014 PCP: Ellender Hose, MD  Admit HPI / Brief Narrative: 58 yo female w/ a history of asthma; hypertension; degenerative disc disease w/ chronic back pain; reflux; diabetes mellitus; and morbid obesity who presented with shortness of breath and confusion. On arrival patient was noted to be hypoxic to 40%. CXR showed evidence of fluid overload and possible infiltrate. Patient was noted to be slightly hypotensive 90/60. WBC elevated to 11.9 with low grade fever up to 99.6.   HPI/Subjective: Had a rough night due to confusion and agitation.  Her wife was able to assist in calming her down.  This appears to have been related to a trial of ambien.  K+ signif low today at 3.0.    Assessment/Plan:  Acute encephalopathy likely from combined hypoxic and hypercarbic resp failure + narcotics + benzos - CT head negative - approaching baseline mental status - last nights episode likely due to [place], which has been discontinued - will add low dose seroquel as a prn in case this represents sundowning    Hypoxic hypercarbic acute respiratory failure  obesity, possible COPD (smoker), untreated OSA, diastolic HF - PCCM following - counseled pt at length on need for no smoking, weight loss, minimization of sedating meds - suprisingly the patient's ambulatory sats are 90% or > and she will therefore not need home O2  Acute on chronic diastolic heart failure Currently well diuresed and not in any acute decompensated heart failure  Hypokalemia K+ declining despite efforts to supplement - Mg is normal - increase supplementation - delay d/c 24hrs to attempt to improve   Elevated d-dimer Pt at high risk for PE given obesity but CT angio of chest was unrevealing - dopplers were negative for DVT   Moderate Pulmonary HTN multi factorial:  obesity, OSA, heart failure - no evidence on RHC to suggest  primary pulmonary HTN - cont CCB - continue diuresis - Cards eval complete - with diuresis sats have improved to point that home O2 not currently required   ?LLL HCAP F/u CXR makes this appear less likely - no fever or leukocytosis - procalcitonin negative - now off abx   Chronic pain w/ narcotic/benzo dependence Dr. Sherral Hammers has adjusted tx regimen - agree that high dose narcotics in this pt w/ probable OHS/SA and signif hypoxic resp failure is simply too dangerous - have counseled pt extensively that I will NOT INCREASE HER NARCOTICS OR BENZOS further as I feel this would be dangerous - cymbalta initiated - pt made no attempt to have dosing increased today  Transient low grade hemoptysis  CT negative for PE - sx have resolved   Tobacco abuse  Counseled at length on absolute need to stop smoking   Hypotension  Resolved  HTN Adjustments made in med tx again today - BP not yet at goal   DDD w/ chronic back pain  See discussion above  Mild transaminitis  Resolving - likely passive hepatic congestion due to Pulm HTN/RH failure   DM CBGs improved - no change in tx plan today - A1c 6.8  Morbid obesity - Body mass index is 36.56 kg/(m^2). Discussed link between obesity and her current illness - discussed need for signif lasting weight loss  Code Status: FULL Family Communication: no family present at time of exam  Disposition Plan: probable d/c home in AM 11/30  Consultants: PCCM Cardiology - Einar Gip  Procedures: TTE - 11/23 - EF  60% - no WMA - PA pressure 23mm Hg B Venous duplex - 11/23 - no DVT or SVT L&R Heart Cath 11/27 - normal coronary arteries - Right heart catheterization reveals moderate pulmonary hypertension, supranormal cardiac output and cardiac index secondary to morbid obesity and body habitus  Antibiotics: Zosyn 10/16/14 >10/18/14  DVT prophylaxis: SQ heparin   Objective: Blood pressure 156/79, pulse 89, temperature 98 F (36.7 C), temperature source Oral,  resp. rate 16, height 5' 9.75" (1.772 m), weight 114.8 kg (253 lb 1.4 oz), SpO2 94 %.  Intake/Output Summary (Last 24 hours) at 10/23/14 1134 Last data filed at 10/23/14 1018  Gross per 24 hour  Intake    100 ml  Output      0 ml  Net    100 ml   Exam: General: No acute respiratory distress on RA  Lungs: Clear to auscultation bilaterally without wheezes or crackles - distant BS th/o  Cardiovascular: Regular rate and rhythm without murmur gallop or rub - distant HS  Abdomen: Nontender, obese, soft, bowel sounds positive, no rebound, no ascites, no appreciable mass Extremities: No significant cyanosis, clubbing, edema bilateral lower extremities  Data Reviewed: Basic Metabolic Panel:  Recent Labs Lab 10/17/14 0540 10/18/14 0235 10/20/14 0549 10/22/14 0430 10/23/14 0446  NA 141 141 142 139 141  K 4.1 3.8 3.8 3.2* 3.0*  CL 99 97 98 97 98  CO2 26 31 30 29 27   GLUCOSE 114* 111* 164* 162* 171*  BUN 13 9 18 19 17   CREATININE 1.00 0.81 0.83 0.77 0.78  CALCIUM 8.9 8.9 9.6 9.4 9.4  MG 2.1  --   --   --   --     Liver Function Tests:  Recent Labs Lab 10/16/14 2103 10/17/14 0540 10/18/14 0235 10/20/14 0549 10/22/14 0430  AST 12 11 12  45* 33  ALT 12 12 12  49* 61*  ALKPHOS 154* 153* 149* 150* 130*  BILITOT 0.7 0.6 0.7 0.6 0.6  PROT 7.7 8.0 7.7 8.4* 7.8  ALBUMIN 3.1* 3.4* 3.2* 3.8 3.7   Coags:  Recent Labs Lab 10/16/14 2103 10/17/14 0540  INR 1.01 0.97   CBC:  Recent Labs Lab 10/16/14 2103 10/17/14 0540 10/18/14 0235 10/22/14 0430 10/23/14 0446  WBC 11.9* 11.5* 9.0 12.1* 10.5  NEUTROABS  --  7.7  --   --   --   HGB 10.5* 10.8* 10.3* 11.7* 11.7*  HCT 36.3 35.9* 35.7* 38.2 38.3  MCV 87.3 86.7 89.0 82.0 82.7  PLT 381 373 356 389 403*    Cardiac Enzymes:  Recent Labs Lab 10/17/14 0540 10/17/14 0654 10/17/14 1614  TROPONINI <0.30 <0.30 <0.30   BNP (last 3 results)  Recent Labs  10/16/14 2245 10/17/14 0540  PROBNP 1935.0* 1221.0*     CBG:  Recent Labs Lab 10/22/14 0659 10/22/14 1139 10/22/14 1642 10/22/14 2254 10/23/14 0751  GLUCAP 191* 173* 173* 161* 143*    Recent Results (from the past 240 hour(s))  Culture, blood (routine x 2)     Status: None (Preliminary result)   Collection Time: 10/16/14 10:41 PM  Result Value Ref Range Status   Specimen Description BLOOD RIGHT HAND  Final   Special Requests BOTTLES DRAWN AEROBIC AND ANAEROBIC 5CC EACH  Final   Culture  Setup Time   Final    10/17/2014 10:13 Performed at [facility]    Culture   Final           BLOOD CULTURE RECEIVED NO GROWTH TO DATE CULTURE  WILL BE HELD FOR 5 DAYS BEFORE ISSUING A FINAL NEGATIVE REPORT Performed at [facility]    Report Status PENDING  Incomplete  Culture, blood (routine x 2)     Status: None (Preliminary result)   Collection Time: 10/16/14 10:43 PM  Result Value Ref Range Status   Specimen Description BLOOD LEFT HAND  Final   Special Requests BOTTLES DRAWN AEROBIC AND ANAEROBIC 5CC  Final   Culture  Setup Time   Final    10/17/2014 10:13 Performed at [facility]    Culture   Final           BLOOD CULTURE RECEIVED NO GROWTH TO DATE CULTURE WILL BE HELD FOR 5 DAYS BEFORE ISSUING A FINAL NEGATIVE REPORT Performed at [facility]    Report Status PENDING  Incomplete  MRSA PCR Screening     Status: None   Collection Time: 10/17/14  2:41 PM  Result Value Ref Range Status   MRSA by PCR NEGATIVE NEGATIVE Final    Comment:        The GeneXpert MRSA Assay (FDA approved for NASAL specimens only), is one component of a comprehensive MRSA colonization surveillance program. It is not intended to diagnose MRSA infection nor to guide or monitor treatment for MRSA infections.   Culture, sputum-assessment     Status: None   Collection Time: 10/19/14  7:53 AM  Result Value Ref Range Status   Specimen Description SPUTUM  Final   Special Requests Normal  Final   Sputum evaluation   Final     MICROSCOPIC FINDINGS SUGGEST THAT THIS SPECIMEN IS NOT REPRESENTATIVE OF LOWER RESPIRATORY SECRETIONS. PLEASE RECOLLECT. Gram Stain Report Called to,Read Back By and Verified With: Jannette Spanner AT [phone] 10/19/14 BY K BARR    Report Status 10/19/2014 FINAL  Final  Gram stain     Status: None   Collection Time: 10/19/14  7:54 AM  Result Value Ref Range Status   Specimen Description SPUTUM  Final   Special Requests Normal  Final   Gram Stain   Final    SQUAMOUS EPITHELIAL CELLS PRESENT FEW GRAM POSITIVE COCCI IN PAIRS IN CHAINS FEW GRAM POSITIVE COCCI IN PAIRS IN CLUSTERS RARE YEAST    Report Status 10/19/2014 FINAL  Final     Studies:  Recent x-ray studies have been reviewed in detail by the Attending Physician  Scheduled Meds:  Scheduled Meds: . atorvastatin  10 mg Oral Daily  . diltiazem  240 mg Oral Daily  . DULoxetine  30 mg Oral Daily  . furosemide  20 mg Oral BID  . gabapentin  300 mg Oral TID  . guaiFENesin  600 mg Oral BID  . heparin subcutaneous  5,000 Units Subcutaneous 3 times per day  . insulin aspart  0-15 Units Subcutaneous TID WC  . LORazepam  1 mg Oral BID  . metFORMIN  500 mg Oral BID WC  . mometasone-formoterol  2 puff Inhalation BID  . OxyCODONE  40 mg Oral Q12H  . pantoprazole  40 mg Oral Daily  . potassium chloride  20 mEq Oral BID  . potassium chloride  40 mEq Oral Once  . tiotropium  18 mcg Inhalation Daily    Time spent on care of this patient: 25 mins   Dory Verdun T , MD   [facility] Office  [phone] Pager - Text Page per Shea Evans as per below:  On-Call/Text Page:      [email or web address]      password  [email or web address]  If 7PM-7AM, please contact night-coverage [email or web address] Password [email or web address] 10/23/2014, 11:34 AM   LOS: 7 days

## 2014-10-23 NOTE — Progress Notes (Signed)
OT Screen Note  Patient Details Name: [name] MRN: [id number] DOB: 1956/05/26   Cancelled Treatment:    Reason Eval/Treat Not Completed: OT screened, no needs identified, will sign off (spoke with DR Thereasa Solo)  Peri Maris  Pager: [phone]  10/23/2014, 11:53 AM

## 2014-10-23 NOTE — Discharge Summary (Signed)
DISCHARGE SUMMARY  Teresa Wiley  MR#: [id number]  DOB:12-11-1955  Date of Admission: 10/16/2014 Date of Discharge: 10/24/2014  Attending Physician:Skiler Tye T  Patient's JOI:NOMVE Ola Spurr, MD  Consults: PCCM Cardiology - Dr. Einar Gip  Disposition: D/C home   Follow-up Appts:     Follow-up Information    Follow up with Ellender Hose, MD.   Why:  Keep your scheduled appointment with your doctor at [facility] on Friday, December 4th - you will need to have your potassium level and creatinine (kidney function) checked at that time      Tests Needing Follow-up: -routine recheck of CBGs is suggested -f/u of K+ and renal function is suggested within 5 days of d/c  -routine f/u of LFTs is suggested    Discharge Diagnoses: Acute encephalopathy Hypoxic hypercarbic acute respiratory failure  Acute on chronic diastolic heart failure Moderate Pulmonary HTN Chronic pain w/ narcotic/benzo dependence Transient low grade hemoptysis  Tobacco abuse  Hypotension  HTN DDD w/ chronic back pain  Mild transaminitis  DM Morbid obesity - Body mass index is 36.56 kg/(m^2)  Initial presentation: 58 yo female w/ a history of asthma; hypertension; degenerative disc disease w/ chronic back pain; reflux; diabetes mellitus; and morbid obesity who presented with shortness of breath and confusion. On arrival patient was noted to be hypoxic to 40%. CXR showed evidence of fluid overload and possible infiltrate. Patient was noted to be slightly hypotensive 90/60. WBC elevated to 11.9 with low grade fever up to 99.6.   Procedures: TTE - 11/23 - EF 60% - no WMA - PA pressure 67mm Hg B Venous duplex - 11/23 - no DVT or SVT L&R Heart Cath 11/27 - normal coronary arteries - Right heart catheterization reveals moderate pulmonary hypertension, supranormal cardiac output and cardiac index secondary to morbid obesity and body habitus  Hospital Course:  Acute encephalopathy likely  from combined hypoxic and hypercarbic resp failure + narcotics + benzos - CT head negative - returned to baseline mental status prior to d/c home - pt counseled extensively and on multiple occasions on the danger of overuse of benzos and narcotics, particularly in her case due to OHS and pulm HTN  Hypoxic hypercarbic acute respiratory failure  obesity, possible COPD (smoker), untreated OSA, diastolic HF - PCCM evaluated - counseled pt at length on need for no smoking, weight loss, minimization of sedating meds - suprisingly the patient's ambulatory sats are 90% or > and she will therefore not need home O2 during the day - night time saturations were 80% so nocturnal O2 will be needed   Acute on chronic diastolic heart failure Currently well diuresed and not in any acute decompensated heart failure - cont BID lasix w/ Kdur   Elevated d-dimer Pt at high risk for PE given obesity but CT angio of chest was unrevealing - dopplers were negative for DVT   Moderate Pulmonary HTN multi factorial: obesity, OSA, heart failure - no evidence on RHC to suggest primary pulmonary HTN - cont CCB - continue diuresis - Cards eval complete - with diuresis sats have improved to point that home O2 not currently required during the day   ?LLL HCAP F/u CXR makes this appear less likely - no fever or leukocytosis - procalcitonin negative - now off abx   Chronic pain w/ narcotic/benzo dependence Dr. Sherral Hammers adjusted tx regimen - agree that high dose narcotics in this pt w/ probable OHS/SA and signif hypoxic resp failure is simply too dangerous - have counseled pt extensively that I  will NOT INCREASE HER NARCOTICS OR BENZOS further as I feel this would be dangerous - cymbalta initiated - at time of d/c pt is being provided with specific instructions to stop xanax (short acting) completely/permanently, to take ativan (long acting) only as prescribed, and to take her narcotics only a as prescribed - her narcotic doses are  signif lower that her home doses, and will need to be tapered further in time by her PCP - she is provided with ONLY 30 tabs of ativan 1mg  and NO NARCOTIC rxs were provided as pt has "plenty" at home per her report   Transient low grade hemoptysis  CT negative for PE - sx have resolved   Tobacco abuse  Counseled at length on absolute need to stop smoking   Hypotension  Resolved  HTN Adjustments made in med tx - BP improved   DDD w/ chronic back pain  See discussion above  Mild transaminitis  likely passive hepatic congestion due to Pulm HTN/RH failure - outpt monitoring will be indicated   DM Resume usual home meds at time of d/c - A1c 6.8  Morbid obesity - Body mass index is 36.56 kg/(m^2). Discussed link between obesity and her current illness - discussed need for signif lasting weight loss     Medication List    STOP taking these medications        ALPRAZolam 1 MG tablet  Commonly known as:  XANAX     amLODipine 10 MG tablet  Commonly known as:  NORVASC      TAKE these medications        albuterol 108 (90 BASE) MCG/ACT inhaler  Commonly known as:  PROVENTIL HFA;VENTOLIN HFA  Inhale 2 puffs into the lungs every 6 (six) hours as needed for wheezing or shortness of breath. For shortness of breath     atorvastatin 10 MG tablet  Commonly known as:  LIPITOR  Take 10 mg by mouth daily.     [employer] BLOOD GLUCOSE TEST STRIPS test strip  Generic drug:  glucose blood  1 strip by Subdermal route 5 (five) times daily.     diltiazem 300 MG 24 hr capsule  Commonly known as:  CARDIZEM CD  Take 1 capsule (300 mg total) by mouth daily.     docusate sodium 100 MG capsule  Commonly known as:  COLACE  Take 100 mg by mouth daily as needed for mild constipation.     DULoxetine 30 MG capsule  Commonly known as:  CYMBALTA  Take 1 capsule (30 mg total) by mouth daily.     esomeprazole 40 MG capsule  Commonly known as:  NEXIUM  Take 40 mg by mouth 2 (two) times daily.      furosemide 20 MG tablet  Commonly known as:  LASIX  Take 1 tablet (20 mg total) by mouth 2 (two) times daily.     gabapentin 300 MG capsule  Commonly known as:  NEURONTIN  Take 1 capsule (300 mg total) by mouth 3 (three) times daily.     lisinopril 5 MG tablet  Commonly known as:  PRINIVIL,ZESTRIL  Take 1 tablet (5 mg total) by mouth daily.     LORazepam 1 MG tablet  Commonly known as:  ATIVAN  Take 1 tablet (1 mg total) by mouth 2 (two) times daily.     metFORMIN 500 MG tablet  Commonly known as:  GLUCOPHAGE  Take 500 mg by mouth 2 (two) times daily with a meal.  mometasone-formoterol 100-5 MCG/ACT Aero  Commonly known as:  DULERA  Inhale 2 puffs into the lungs 2 (two) times daily.     ondansetron 4 MG tablet  Commonly known as:  ZOFRAN  Take 1 tablet (4 mg total) by mouth every 6 (six) hours.     oxyCODONE 40 MG 12 hr tablet  Commonly known as:  OXYCONTIN  Take 1 tablet (40 mg total) by mouth every 12 (twelve) hours. DO NOT TAKE EXTRA DOSES OF THIS MEDICATION - EXTRA DOSES COULD LEAD TO YOUR DEATH     oxyCODONE-acetaminophen 10-325 MG per tablet  Commonly known as:  PERCOCET  Take 1 tablet by mouth every 6 (six) hours as needed for pain (do not exceed 4 tablets in a day - DO NOT TAKE EXTRA DOSES OF THIS MEDICATION - EXTRA DOSES COULD LEAD TO YOUR DEATH).     potassium chloride SA 20 MEQ tablet  Commonly known as:  K-DUR,KLOR-CON  Take 1 tablet (20 mEq total) by mouth 2 (two) times daily.     tiotropium 18 MCG inhalation capsule  Commonly known as:  SPIRIVA  Place 1 capsule (18 mcg total) into inhaler and inhale daily.        Day of Discharge BP 147/84 mmHg  Pulse 94  Temp(Src) 98.1 F (36.7 C) (Oral)  Resp 20  Ht 5' 9.75" (1.772 m)  Wt 115.078 kg (253 lb 11.2 oz)  BMI 36.65 kg/m2  SpO2 100%  Physical Exam: General: No acute respiratory distress Lungs: Clear to auscultation bilaterally without wheezes or crackles Cardiovascular: Regular rate and rhythm  without murmur gallop or rub  Abdomen: Nontender, nondistended, soft, bowel sounds positive, no rebound, no ascites, no appreciable mass Extremities: No significant cyanosis, clubbing, or edema bilateral lower extremities  Basic Metabolic Panel:  Recent Labs Lab 10/18/14 0235 10/20/14 0549 10/22/14 0430 10/23/14 0446 10/24/14 0403  NA 141 142 139 141 139  K 3.8 3.8 3.2* 3.0* 3.7  CL 97 98 97 98 98  CO2 31 30 29 27 26   GLUCOSE 111* 164* 162* 171* 147*  BUN 9 18 19 17 14   CREATININE 0.81 0.83 0.77 0.78 0.81  CALCIUM 8.9 9.6 9.4 9.4 9.1  MG  --   --   --   --  2.4    Liver Function Tests:  Recent Labs Lab 10/18/14 0235 10/20/14 0549 10/22/14 0430 10/24/14 0403  AST 12 45* 33 43*  ALT 12 49* 61* 76*  ALKPHOS 149* 150* 130* 126*  BILITOT 0.7 0.6 0.6 0.7  PROT 7.7 8.4* 7.8 7.5  ALBUMIN 3.2* 3.8 3.7 3.7   CBC:  Recent Labs Lab 10/18/14 0235 10/22/14 0430 10/23/14 0446 10/24/14 0403  WBC 9.0 12.1* 10.5 12.9*  HGB 10.3* 11.7* 11.7* 12.0  HCT 35.7* 38.2 38.3 39.8  MCV 89.0 82.0 82.7 83.1  PLT 356 389 403* 429*   Cardiac Enzymes:  Recent Labs Lab 10/17/14 1614  TROPONINI <0.30   BNP (last 3 results)  Recent Labs  10/16/14 2245 10/17/14 0540  PROBNP 1935.0* 1221.0*   CBG:  Recent Labs Lab 10/23/14 1152 10/23/14 1640 10/23/14 2105 10/24/14 0747 10/24/14 1153  GLUCAP 134* 134* 149* 157* 141*    Recent Results (from the past 240 hour(s))  Culture, blood (routine x 2)     Status: None   Collection Time: 10/16/14 10:41 PM  Result Value Ref Range Status   Specimen Description BLOOD RIGHT HAND  Final   Special Requests BOTTLES DRAWN AEROBIC AND ANAEROBIC 5CC  EACH  Final   Culture  Setup Time   Final    10/17/2014 10:13 Performed at [facility]    Culture   Final    NO GROWTH 5 DAYS Performed at [facility]    Report Status 10/23/2014 FINAL  Final  Culture, blood (routine x 2)     Status: None   Collection Time: 10/16/14  10:43 PM  Result Value Ref Range Status   Specimen Description BLOOD LEFT HAND  Final   Special Requests BOTTLES DRAWN AEROBIC AND ANAEROBIC 5CC  Final   Culture  Setup Time   Final    10/17/2014 10:13 Performed at [facility]    Culture   Final    NO GROWTH 5 DAYS Performed at [facility]    Report Status 10/23/2014 FINAL  Final  MRSA PCR Screening     Status: None   Collection Time: 10/17/14  2:41 PM  Result Value Ref Range Status   MRSA by PCR NEGATIVE NEGATIVE Final    Comment:        The GeneXpert MRSA Assay (FDA approved for NASAL specimens only), is one component of a comprehensive MRSA colonization surveillance program. It is not intended to diagnose MRSA infection nor to guide or monitor treatment for MRSA infections.   Culture, sputum-assessment     Status: None   Collection Time: 10/19/14  7:53 AM  Result Value Ref Range Status   Specimen Description SPUTUM  Final   Special Requests Normal  Final   Sputum evaluation   Final    MICROSCOPIC FINDINGS SUGGEST THAT THIS SPECIMEN IS NOT REPRESENTATIVE OF LOWER RESPIRATORY SECRETIONS. PLEASE RECOLLECT. Gram Stain Report Called to,Read Back By and Verified With: Jannette Spanner AT 2505 10/19/14 BY K BARR    Report Status 10/19/2014 FINAL  Final  Gram stain     Status: None   Collection Time: 10/19/14  7:54 AM  Result Value Ref Range Status   Specimen Description SPUTUM  Final   Special Requests Normal  Final   Gram Stain   Final    SQUAMOUS EPITHELIAL CELLS PRESENT FEW GRAM POSITIVE COCCI IN PAIRS IN CHAINS FEW GRAM POSITIVE COCCI IN PAIRS IN CLUSTERS RARE YEAST    Report Status 10/19/2014 FINAL  Final      Time spent in discharge (includes decision making & examination of pt): >30 minutes  10/24/2014, 12:33 PM   Cherene Altes, MD [facility] Office  [phone] Pager [phone]  On-Call/Text Page:      [email or web address]      password [facility]

## 2014-10-23 NOTE — Progress Notes (Signed)
Dr. Dyann Kief notified that pt woke up very confused and agitated. Advised her that pt was given prn ambien for sleep and pt had not received this before. Pt was put in contact with life partner and was beginning to be more calm at this time. Dr. Dyann Kief DC'd Lorrin Mais and advised to continue to monitor pt at this time.

## 2014-10-24 LAB — COMPREHENSIVE METABOLIC PANEL
ALT: 76 U/L — ABNORMAL HIGH (ref 0–35)
AST: 43 U/L — AB (ref 0–37)
Albumin: 3.7 g/dL (ref 3.5–5.2)
Alkaline Phosphatase: 126 U/L — ABNORMAL HIGH (ref 39–117)
Anion gap: 15 (ref 5–15)
BUN: 14 mg/dL (ref 6–23)
CALCIUM: 9.1 mg/dL (ref 8.4–10.5)
CHLORIDE: 98 meq/L (ref 96–112)
CO2: 26 mEq/L (ref 19–32)
CREATININE: 0.81 mg/dL (ref 0.50–1.10)
GFR calc Af Amer: >90 mL/min (ref >90)
GFR calc non Af Amer: 79 mL/min — ABNORMAL LOW (ref >90)
Glucose, Bld: 147 mg/dL — ABNORMAL HIGH (ref 70–99)
Potassium: 3.7 mEq/L (ref 3.7–5.3)
Sodium: 139 mEq/L (ref 137–147)
TOTAL PROTEIN: 7.5 g/dL (ref 6.0–8.3)
Total Bilirubin: 0.7 mg/dL (ref 0.3–1.2)

## 2014-10-24 LAB — CBC
HCT: 39.8 % (ref 36.0–46.0)
Hemoglobin: 12 g/dL (ref 12.0–15.0)
MCH: 25.1 pg — ABNORMAL LOW (ref 26.0–34.0)
MCHC: 30.2 g/dL (ref 30.0–36.0)
MCV: 83.1 fL (ref 78.0–100.0)
PLATELETS: 429 10*3/uL — AB (ref 150–400)
RBC: 4.79 MIL/uL (ref 3.87–5.11)
RDW: 15.3 % (ref 11.5–15.5)
WBC: 12.9 10*3/uL — AB (ref 4.0–10.5)

## 2014-10-24 LAB — MAGNESIUM: MAGNESIUM: 2.4 mg/dL (ref 1.5–2.5)

## 2014-10-24 LAB — GLUCOSE, CAPILLARY
GLUCOSE-CAPILLARY: 141 mg/dL — AB (ref 70–99)
Glucose-Capillary: 157 mg/dL — ABNORMAL HIGH (ref 70–99)

## 2014-10-24 MED ORDER — POTASSIUM CHLORIDE CRYS ER 20 MEQ PO TBCR: 20.0000 meq | EXTENDED_RELEASE_TABLET | Freq: Two times a day (BID) | ORAL | Status: DC

## 2014-10-24 MED ORDER — DILTIAZEM HCL ER COATED BEADS 300 MG PO CP24: 300.0000 mg | ORAL_CAPSULE | Freq: Every day | ORAL | Status: DC

## 2014-10-24 MED ORDER — LISINOPRIL 5 MG PO TABS: 5.0000 mg | ORAL_TABLET | Freq: Every day | ORAL | Status: DC

## 2014-10-24 MED ORDER — MOMETASONE FURO-FORMOTEROL FUM 100-5 MCG/ACT IN AERO: 2.0000 | INHALATION_SPRAY | Freq: Two times a day (BID) | RESPIRATORY_TRACT | Status: DC

## 2014-10-24 MED ORDER — OXYCODONE HCL 40 MG PO TB12: 40.0000 mg | ORAL_TABLET | Freq: Two times a day (BID) | ORAL | Status: DC

## 2014-10-24 MED ORDER — TIOTROPIUM BROMIDE MONOHYDRATE 18 MCG IN CAPS: 18.0000 ug | ORAL_CAPSULE | Freq: Every day | RESPIRATORY_TRACT | Status: DC

## 2014-10-24 MED ORDER — DULOXETINE HCL 30 MG PO CPEP: 30.0000 mg | ORAL_CAPSULE | Freq: Every day | ORAL | Status: DC

## 2014-10-24 MED ORDER — GABAPENTIN 300 MG PO CAPS: 300.0000 mg | ORAL_CAPSULE | Freq: Three times a day (TID) | ORAL | Status: DC

## 2014-10-24 MED ORDER — LORAZEPAM 1 MG PO TABS: 1.0000 mg | ORAL_TABLET | Freq: Two times a day (BID) | ORAL | Status: DC

## 2014-10-24 MED ORDER — FUROSEMIDE 20 MG PO TABS: 20.0000 mg | ORAL_TABLET | Freq: Two times a day (BID) | ORAL | Status: DC

## 2014-10-24 MED ORDER — OXYCODONE-ACETAMINOPHEN 10-325 MG PO TABS: 1.0000 | ORAL_TABLET | Freq: Four times a day (QID) | ORAL | Status: DC | PRN

## 2014-10-24 MED ORDER — TRAMADOL HCL 50 MG PO TABS
50.0000 mg | ORAL_TABLET | Freq: Two times a day (BID) | ORAL | Status: DC | PRN
  Filled 2014-10-24: qty 1

## 2014-10-24 NOTE — Progress Notes (Addendum)
Informed in report that patient's oxygen saturations decrease during the night to 88% on room air while at rest.  Was placed on 2L [place] to keep oxygen maintained.  Patient's oxygen saturations increased to 93-94%.  Will monitor oxygen saturations during the day.

## 2014-10-24 NOTE — Progress Notes (Signed)
Chaplain responded to consult that pt requested prayer.  Chaplain gave pt a copy of Quran.  Pt began to open up and tell about her story.  Pt shared she had raised 13 children with her spouse.  Pt also shared spiritual distress about reconciling her marital status with faith community.  Chaplain provided emotional and spiritual support and overall affirmation of the pt as a person.  Chaplain will follow up as needed.   10/24/14 1500  Clinical Encounter Type  Visited With Patient  Visit Type Initial;Spiritual support;Social support  Referral From Physician  Spiritual Encounters  Spiritual Needs Emotional  Stress Factors  Patient Stress Factors Exhausted;Family relationships;Health changes;Major life changes  Mertie Moores, Chaplain

## 2014-10-24 NOTE — Discharge Instructions (Signed)
DO NOT TAKE EXTRA DOSES OF YOUR OXYCONTIN OR OXYCODONE - TAKE THEM ONLY AS PRESCRIBED AND LISTED IN THIS PAPERWORK - DO NOT TAKE XANAX EVER AGAIN - TAKING XANAX IN ADDITION TO YOUR PAIN MEDICATIONS AND ATIVAN (LORAZEPAM) COULD EASILY LEAD TO YOUR DEATH   Wear your oxygen every night at prescribed.    Make sure you make and keep regular appointments with your primary care doctor for close monitoring of your medical conditions.   Pulmonary Hypertension  Pulmonary hypertension is high blood pressure within the arteries in your lungs (pulmonary arteries). It is different than having high blood pressure elsewhere in your body, such as blood pressure that is measured with a blood pressure cuff. Pulmonary hypertension makes it harder for blood to flow through the lungs. As a result, the heart must work harder to pump blood through the lungs, and it may be harder for you to breathe. Over time, this can weaken the heart muscle. Pulmonary hypertension is a serious condition and can be fatal.  CAUSES Many different medical conditions can cause pulmonary hypertension. They can be grouped into five different categories:  Group 1--Pulmonary hypertension caused by abnormal growth of small blood vessels in the lungs (pulmonary arterial hypertension). The abnormal blood vessel growth may have no known cause or may be:   Hereditary.  Caused by another disease such as a connective tissue disease (including lupus or scleroderma) or HIV.  Caused by certain drugs or toxins. Group 2--Pulmonary hypertension caused by weakness of the main chamber of the heart (left ventricle) or heart valve disease. Group 3--Pulmonary hypertension caused by lung disease or low oxygen levels. Causes in this group include:  Emphysema or chronic obstructive pulmonary disease (COPD).  Untreated sleep apnea.  Pulmonary fibrosis. Group 4--Pulmonary hypertension caused by blood clots in the lungs (pulmonary emboli).  Group 5--Other  causes of pulmonary hypertension, such as sickle cell anemia, or a mix between multiple causes. SIGNS AND SYMPTOMS  Shortness of breath. You may notice shortness of breath with:  Activity such as walking.  No activity.  Tiredness and fatigue.  Dizziness or fainting.  Rapid heartbeat or feeling the heart flutter or skip a beat (palpitations).  Neck vein enlargement.  Bluish color to the lips and fingertips. DIAGNOSIS  Various tests may be used to help diagnose pulmonary hypertension. These can include:  Chest X-ray.  Arterial blood gases. This test checks the oxygen level in your blood.  CT scans. This test can provide detailed images of your lungs.  Pulmonary function test. This test measures how much air your lungs can hold. It also tests how well air moves in and out of your lungs.  Electrocardiography. This test traces the electrical activity of your heart.  Echocardiography. This test is used to look at your heart in motion and how it functions.  Heart catheterization. This test can measure the pressure in your pulmonary artery and the right side of the heart.  Lung biopsy. A tissue sample is sometimes taken from the lung to check for underlying causes. TREATMENT Pulmonary hypertension has no cure. Treatment is to help relieve symptoms and slow the progress of the condition. Treatment can involve:  Medicines such as:  Blood pressure medicines.  Medicines to relax (dilate) the pulmonary blood vessels.  Diuretic medicines.  Blood thinning medicines.  Surgery. For severe pulmonary hypertension that does not respond to medical treatment, heart-lung or lung transplant may be needed. HOME CARE INSTRUCTIONS  Only take over-the-counter or prescription medicines as directed by  your health care provider. Take all medicines exactly as instructed. Do not change or stop medicines without first checking with your health care provider.  Do not smoke.  Eat a healthy diet.  Decrease how much salt (sodium) you eat by checking nutrition labels and using seasonings without salt. Talk to your health care provider or a dietitian about foods you should eat.  Stay as active as possible. Exercise as directed by your health care provider. Talk to your health care provider about what type of exercise is safe for you.  Avoid high altitudes.  Avoid hot tubs and saunas.  Avoid becoming pregnant, if this applies. Talk to your health care provider about safe methods of birth control.  Follow up with your health care provider as directed. SEEK IMMEDIATE MEDICAL CARE IF:  You have severe shortness of breath.  You develop chest pain or pressure.  You cough up blood.  You develop swelling of your feet or legs.  You have a significant increase in weight over 1-2 days. Document Released: 09/08/2007 Document Revised: 09/01/2013 Document Reviewed: 05/03/2013 [facility] Patient Information 2015 [place], [place]. This information is not intended to replace advice given to you by your health care provider. Make sure you discuss any questions you have with your health care provider.  Accidental Narcotic Overdose A narcotic overdose is the overuse of a narcotic drug. A narcotic overdose can make you pass out and stop breathing. If you are not treated right away, this can cause permanent brain damage or stop your heart. Medicine may be given to reverse the effects of an overdose. If so, this medicine may bring on withdrawal symptoms. The symptoms may be abdominal cramps, throwing up (vomiting), sweating, chills, and nervousness. Injecting narcotics can cause more problems than just an overdose. AIDS, hepatitis, and other very serious infections are transmitted by sharing needles and syringes. If you decide to quit using, there are medicines which can help you through the withdrawal period. Trying to quit all at once on your own can be uncomfortable, but not life-threatening. Call your  caregiver, Narcotics Anonymous, or any drug and alcohol treatment program for further help.  Document Released: 12/19/2004 Document Revised: 02/03/2012 Document Reviewed: 10/13/2009 [facility] Patient Information 2015 [place], [place]. This information is not intended to replace advice given to you by your health care provider. Make sure you discuss any questions you have with your health care provider.

## 2014-10-24 NOTE — Clinical Social Work Note (Signed)
Clinical Social Worker received referral for concerns regarding patient housing needs and possible eviction.  CSW met with patient at bedside who states that she has been able to make arrangements with friends/family for housing opportunities.  Patient family friend at bedside and confirms that patient housing situation has been taken care of.  Patient family friend awaiting oxygen delivery and then able to provide transportation home.    Clinical Social Worker will sign off for now as social work intervention is no longer needed. Please consult us again if new need arises.  Jesse Scinto, LCSW [phone]  

## 2014-10-24 NOTE — Progress Notes (Signed)
MD notified that sats on RA are dropping in 80s.  Per previous RN, MD wanted to be notified if continuous pulse ox placed during night shift showed desaturation.  Patient placed on 2L, currently sating 93-95%.  No new orders. Will continue to monitor patient.

## 2014-10-24 NOTE — Care Management Note (Signed)
    Page 1 of 2   10/24/2014     3:55:21 PM CARE MANAGEMENT NOTE 10/24/2014  Patient:  Teresa Wiley, Teresa Wiley   Account Number:  [id number]  Date Initiated:  10/18/2014  Documentation initiated by:  MAYO,HENRIETTA  Subjective/Objective Assessment:   dx sepsis; loives with SO and family    PCP  Ellender Hose     Action/Plan:   Anticipated DC Date:  10/24/2014   Anticipated DC Plan:  [place]  CM consult      PAC Choice  [place]   Choice offered to / List presented to:  C-1 Patient   DME arranged  [facility]      DME agency  [place] arranged  HH-1 RN  [facility]   Status of service:  Completed, signed off Medicare Important Message given?  NO (If response is "NO", the following Medicare IM given date fields will be blank) Date Medicare IM given:   Medicare IM given by:   Date Additional Medicare IM given:   Additional Medicare IM given by:    Discharge Disposition:  [place]  Per UR Regulation:  Reviewed for med. necessity/level of care/duration of stay  If discussed at [place] of Stay Meetings, dates discussed:    Comments:  10/24/14 [place], BSN [phone] patient is for dc today, she chose [facility] for [facility] for CHF Disease Mgmt and also for home oxygen.  Referral given to [facility], [place] and [place] notified.  Soc will begin 24-48 hrs post dc.  10/18/14 [place] RN MSN BSN CCM Met @ bedside with pt and family.  Pt had a sleep study 7 yrs ago but has not been on CPAP or O2 @ home.  Note O2 sats in 40s when she presented.  Pt scheduled for CT angio tomorrow to r/o PE.  Pt very anxious about new dx of heart failure and possible PE-discussed heart failure mgmt and provided emotional support.

## 2014-11-03 ENCOUNTER — Encounter (HOSPITAL_COMMUNITY): Payer: Self-pay | Admitting: Cardiology

## 2014-12-06 ENCOUNTER — Encounter (HOSPITAL_COMMUNITY): Payer: Self-pay | Admitting: Emergency Medicine

## 2014-12-06 DIAGNOSIS — Z9181 History of falling: Secondary | ICD-10-CM | POA: Diagnosis not present

## 2014-12-06 DIAGNOSIS — Z9889 Other specified postprocedural states: Secondary | ICD-10-CM | POA: Insufficient documentation

## 2014-12-06 DIAGNOSIS — R079 Chest pain, unspecified: Secondary | ICD-10-CM | POA: Insufficient documentation

## 2014-12-06 DIAGNOSIS — E119 Type 2 diabetes mellitus without complications: Secondary | ICD-10-CM | POA: Insufficient documentation

## 2014-12-06 DIAGNOSIS — R52 Pain, unspecified: Secondary | ICD-10-CM | POA: Diagnosis present

## 2014-12-06 DIAGNOSIS — Z720 Tobacco use: Secondary | ICD-10-CM | POA: Diagnosis not present

## 2014-12-06 DIAGNOSIS — R630 Anorexia: Secondary | ICD-10-CM | POA: Diagnosis not present

## 2014-12-06 DIAGNOSIS — R197 Diarrhea, unspecified: Secondary | ICD-10-CM | POA: Insufficient documentation

## 2014-12-06 DIAGNOSIS — K219 Gastro-esophageal reflux disease without esophagitis: Secondary | ICD-10-CM | POA: Insufficient documentation

## 2014-12-06 DIAGNOSIS — I10 Essential (primary) hypertension: Secondary | ICD-10-CM | POA: Diagnosis not present

## 2014-12-06 DIAGNOSIS — R5383 Other fatigue: Secondary | ICD-10-CM | POA: Insufficient documentation

## 2014-12-06 DIAGNOSIS — G8929 Other chronic pain: Secondary | ICD-10-CM | POA: Insufficient documentation

## 2014-12-06 DIAGNOSIS — Z79899 Other long term (current) drug therapy: Secondary | ICD-10-CM | POA: Diagnosis not present

## 2014-12-06 DIAGNOSIS — J45901 Unspecified asthma with (acute) exacerbation: Secondary | ICD-10-CM | POA: Diagnosis not present

## 2014-12-06 DIAGNOSIS — M549 Dorsalgia, unspecified: Secondary | ICD-10-CM | POA: Diagnosis not present

## 2014-12-06 DIAGNOSIS — F419 Anxiety disorder, unspecified: Secondary | ICD-10-CM | POA: Insufficient documentation

## 2014-12-06 LAB — BASIC METABOLIC PANEL
ANION GAP: 11 (ref 5–15)
BUN: <5 mg/dL — ABNORMAL LOW (ref 6–23)
CALCIUM: 9 mg/dL (ref 8.4–10.5)
CO2: 25 mmol/L (ref 19–32)
Chloride: 105 mEq/L (ref 96–112)
Creatinine, Ser: 0.74 mg/dL (ref 0.50–1.10)
GFR calc Af Amer: >90 mL/min (ref >90)
GFR calc non Af Amer: >90 mL/min (ref >90)
GLUCOSE: 119 mg/dL — AB (ref 70–99)
POTASSIUM: 3.5 mmol/L (ref 3.5–5.1)
SODIUM: 141 mmol/L (ref 135–145)

## 2014-12-06 LAB — CBC WITH DIFFERENTIAL/PLATELET
BASOS ABS: 0 10*3/uL (ref 0.0–0.1)
BASOS PCT: 0 % (ref 0–1)
EOS ABS: 0.1 10*3/uL (ref 0.0–0.7)
EOS PCT: 1 % (ref 0–5)
HCT: 42 % (ref 36.0–46.0)
Hemoglobin: 13.5 g/dL (ref 12.0–15.0)
Lymphocytes Relative: 44 % (ref 12–46)
Lymphs Abs: 3.4 10*3/uL (ref 0.7–4.0)
MCH: 26.4 pg (ref 26.0–34.0)
MCHC: 32.1 g/dL (ref 30.0–36.0)
MCV: 82 fL (ref 78.0–100.0)
Monocytes Absolute: 0.5 10*3/uL (ref 0.1–1.0)
Monocytes Relative: 7 % (ref 3–12)
Neutro Abs: 3.7 10*3/uL (ref 1.7–7.7)
Neutrophils Relative %: 48 % (ref 43–77)
PLATELETS: 347 10*3/uL (ref 150–400)
RBC: 5.12 MIL/uL — ABNORMAL HIGH (ref 3.87–5.11)
RDW: 14.7 % (ref 11.5–15.5)
WBC: 7.8 10*3/uL (ref 4.0–10.5)

## 2014-12-06 LAB — I-STAT TROPONIN, ED: TROPONIN I, POC: 0 ng/mL (ref 0.00–0.08)

## 2014-12-06 NOTE — ED Notes (Signed)
Pt with multiple complaints. sts she hasn't felt herself since she was discharged home from hospital. C/o pain in chest and sob.

## 2014-12-06 NOTE — ED Notes (Signed)
Per ems- pt reports "pain in bones" for past 3 weeks with difficulty getting out of bed. Hx DDD. Loss of appetite for the last couple weeks as well. C/o diarrhea today. Pt sts she has fluid on lungs and that L side of heart does not work. cbg 90

## 2014-12-07 ENCOUNTER — Emergency Department (HOSPITAL_COMMUNITY): Payer: Medicaid Other

## 2014-12-07 ENCOUNTER — Emergency Department (HOSPITAL_COMMUNITY)
Admission: EM | Admit: 2014-12-07 | Discharge: 2014-12-07 | Disposition: A | Discharge status: Home / Self Care | Payer: Medicaid Other | Attending: Emergency Medicine | Admitting: Emergency Medicine

## 2014-12-07 DIAGNOSIS — R0602 Shortness of breath: Secondary | ICD-10-CM

## 2014-12-07 LAB — URINALYSIS, ROUTINE W REFLEX MICROSCOPIC
Glucose, UA: NEGATIVE mg/dL
HGB URINE DIPSTICK: NEGATIVE
Ketones, ur: 15 mg/dL — AB
Leukocytes, UA: NEGATIVE
Nitrite: NEGATIVE
PH: 7 (ref 5.0–8.0)
Protein, ur: NEGATIVE mg/dL
SPECIFIC GRAVITY, URINE: 1.024 (ref 1.005–1.030)
Urobilinogen, UA: 1 mg/dL (ref 0.0–1.0)

## 2014-12-07 LAB — BRAIN NATRIURETIC PEPTIDE: B NATRIURETIC PEPTIDE 5: 12.3 pg/mL (ref 0.0–100.0)
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
## 2015-01-13 IMAGING — CR DG CHEST 2V
2 series · 2 of 2 positions shown · non-contrast
Comparison: Radiographs and CT September 2014

CLINICAL DATA: Shortness of breath.  Chest pressure, body aches.

EXAM:
CHEST  2 VIEW

[chest pa]
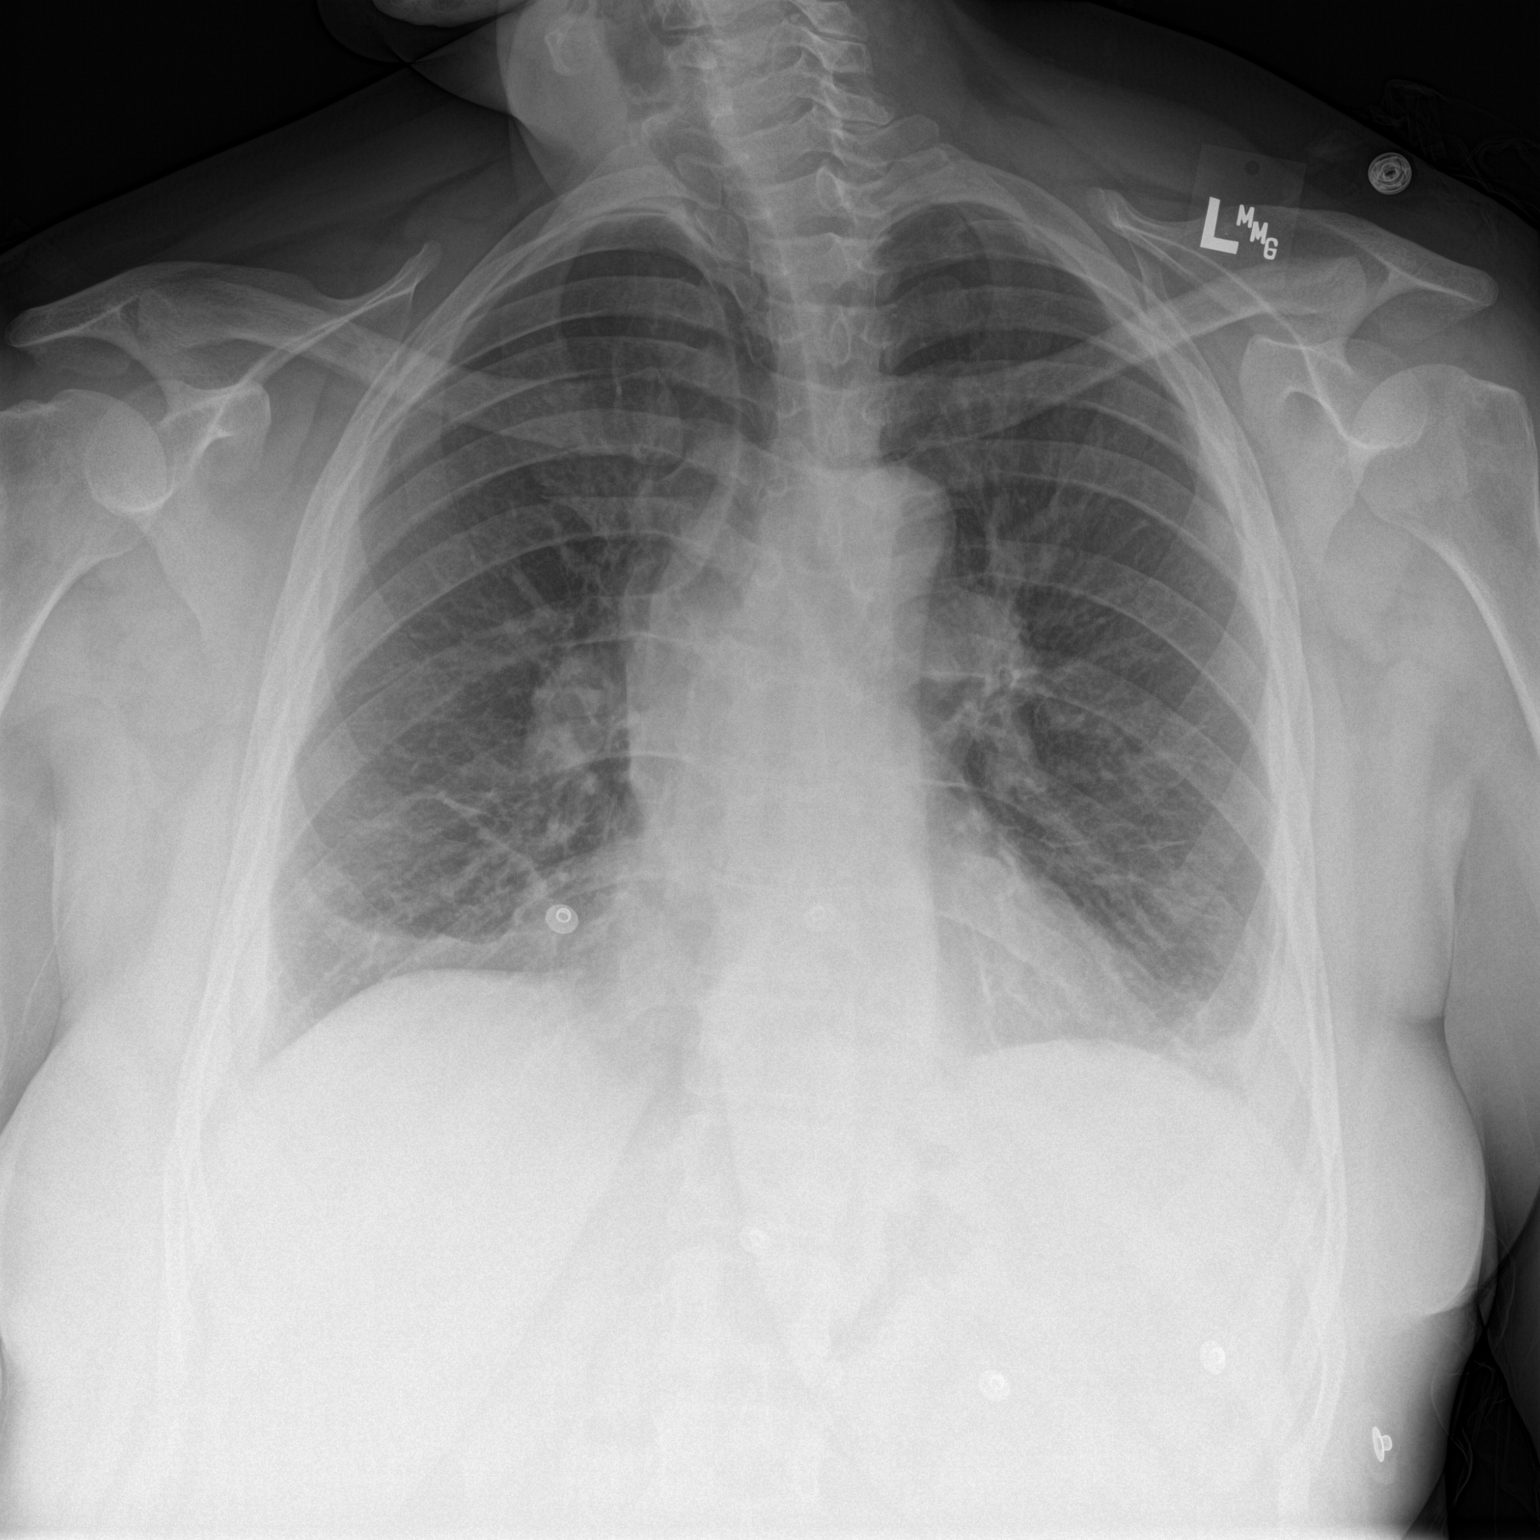

[chest lat]
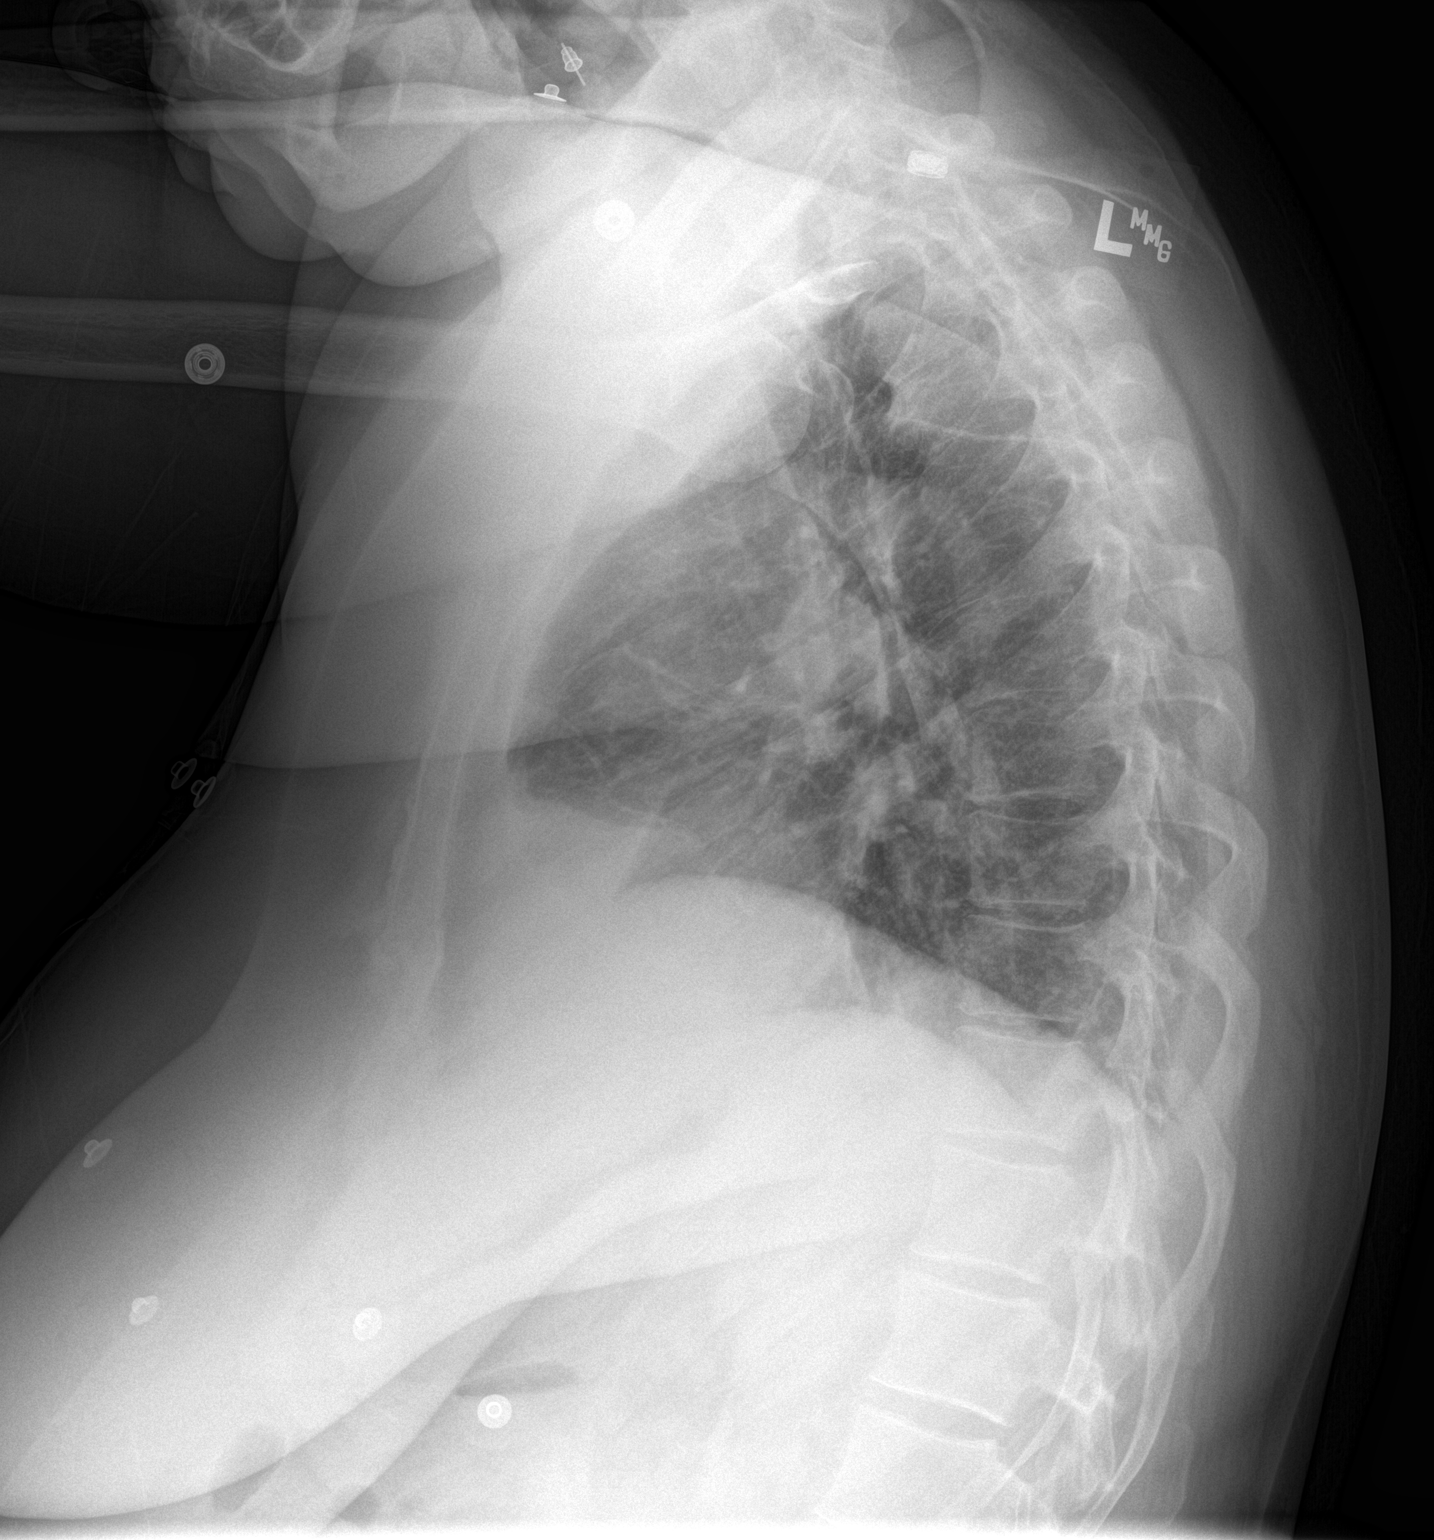

[2 of 2 positions shown; findings below may reference images not displayed]

FINDINGS: Decreased cardiomegaly. Enlarged pulmonary hila, similar to prior.
Tortuosity of the thoracic aorta is again seen. Resolution of
previous pulmonary edema. There is linear atelectasis at the right
lung base. There is prominent epicardial fat pad. No definite
pleural effusion. No confluent airspace disease. No pneumothorax. No
acute osseous abnormalities are seen.
IMPRESSION: Resolution of pulmonary edema. Decreased cardiomegaly. Enlarged
pulmonary hila similar to prior. No acute process.

## 2015-05-04 ENCOUNTER — Encounter (HOSPITAL_BASED_OUTPATIENT_CLINIC_OR_DEPARTMENT_OTHER): Payer: Self-pay

## 2015-05-04 ENCOUNTER — Emergency Department (HOSPITAL_BASED_OUTPATIENT_CLINIC_OR_DEPARTMENT_OTHER)
Admission: EM | Admit: 2015-05-04 | Discharge: 2015-05-04 | Disposition: A | Discharge status: Home / Self Care | Payer: Medicaid Other | Attending: Emergency Medicine | Admitting: Emergency Medicine

## 2015-05-04 ENCOUNTER — Other Ambulatory Visit: Payer: Self-pay

## 2015-05-04 ENCOUNTER — Emergency Department (HOSPITAL_BASED_OUTPATIENT_CLINIC_OR_DEPARTMENT_OTHER): Payer: Medicaid Other

## 2015-05-04 DIAGNOSIS — F419 Anxiety disorder, unspecified: Secondary | ICD-10-CM | POA: Diagnosis not present

## 2015-05-04 DIAGNOSIS — Z9181 History of falling: Secondary | ICD-10-CM | POA: Diagnosis not present

## 2015-05-04 DIAGNOSIS — I10 Essential (primary) hypertension: Secondary | ICD-10-CM | POA: Insufficient documentation

## 2015-05-04 DIAGNOSIS — G8929 Other chronic pain: Secondary | ICD-10-CM | POA: Diagnosis not present

## 2015-05-04 DIAGNOSIS — J40 Bronchitis, not specified as acute or chronic: Secondary | ICD-10-CM

## 2015-05-04 DIAGNOSIS — Z87891 Personal history of nicotine dependence: Secondary | ICD-10-CM | POA: Diagnosis not present

## 2015-05-04 DIAGNOSIS — Z8739 Personal history of other diseases of the musculoskeletal system and connective tissue: Secondary | ICD-10-CM | POA: Insufficient documentation

## 2015-05-04 DIAGNOSIS — J45909 Unspecified asthma, uncomplicated: Secondary | ICD-10-CM | POA: Insufficient documentation

## 2015-05-04 DIAGNOSIS — Z79899 Other long term (current) drug therapy: Secondary | ICD-10-CM | POA: Diagnosis not present

## 2015-05-04 DIAGNOSIS — E119 Type 2 diabetes mellitus without complications: Secondary | ICD-10-CM | POA: Diagnosis not present

## 2015-05-04 DIAGNOSIS — R042 Hemoptysis: Secondary | ICD-10-CM | POA: Diagnosis present

## 2015-05-04 DIAGNOSIS — K219 Gastro-esophageal reflux disease without esophagitis: Secondary | ICD-10-CM | POA: Diagnosis not present

## 2015-05-04 HISTORY — DX: Pulmonary hypertension, unspecified: I27.20

## 2015-05-04 LAB — D-DIMER, QUANTITATIVE (NOT AT ARMC)

## 2015-05-04 LAB — CBC WITH DIFFERENTIAL/PLATELET
Basophils Absolute: 0 10*3/uL (ref 0.0–0.1)
Basophils Relative: 0 % (ref 0–1)
Eosinophils Absolute: 0.3 10*3/uL (ref 0.0–0.7)
Eosinophils Relative: 5 % (ref 0–5)
HEMATOCRIT: 36.1 % (ref 36.0–46.0)
Hemoglobin: 10.5 g/dL — ABNORMAL LOW (ref 12.0–15.0)
LYMPHS ABS: 2.7 10*3/uL (ref 0.7–4.0)
Lymphocytes Relative: 36 % (ref 12–46)
MCH: 23.8 pg — ABNORMAL LOW (ref 26.0–34.0)
MCHC: 29.1 g/dL — ABNORMAL LOW (ref 30.0–36.0)
MCV: 81.9 fL (ref 78.0–100.0)
MONO ABS: 0.7 10*3/uL (ref 0.1–1.0)
Monocytes Relative: 9 % (ref 3–12)
NEUTROS ABS: 3.8 10*3/uL (ref 1.7–7.7)
Neutrophils Relative %: 50 % (ref 43–77)
PLATELETS: 356 10*3/uL (ref 150–400)
RBC: 4.41 MIL/uL (ref 3.87–5.11)
RDW: 15.3 % (ref 11.5–15.5)
WBC: 7.6 10*3/uL (ref 4.0–10.5)

## 2015-05-04 LAB — I-STAT CG4 LACTIC ACID, ED: Lactic Acid, Venous: 0.77 mmol/L (ref 0.5–2.0)

## 2015-05-04 LAB — BASIC METABOLIC PANEL
Anion gap: 7 (ref 5–15)
BUN: 11 mg/dL (ref 6–20)
CHLORIDE: 101 mmol/L (ref 101–111)
CO2: 30 mmol/L (ref 22–32)
Calcium: 8.9 mg/dL (ref 8.9–10.3)
Creatinine, Ser: 0.71 mg/dL (ref 0.44–1.00)
GFR calc Af Amer: >60 mL/min (ref >60)
GFR calc non Af Amer: >60 mL/min (ref >60)
Glucose, Bld: 99 mg/dL (ref 65–99)
POTASSIUM: 4.2 mmol/L (ref 3.5–5.1)
Sodium: 138 mmol/L (ref 135–145)

## 2015-05-04 LAB — TROPONIN I: Troponin I: <0.03 ng/mL (ref <0.031)

## 2015-05-04 LAB — BRAIN NATRIURETIC PEPTIDE: B NATRIURETIC PEPTIDE 5: 29.6 pg/mL (ref 0.0–100.0)

## 2015-05-04 MED ORDER — MORPHINE SULFATE 4 MG/ML IJ SOLN
4.0000 mg | Freq: Once | INTRAMUSCULAR | Status: AC
  Administered 2015-05-04: 4 mg via INTRAVENOUS
  Filled 2015-05-04: qty 1

## 2015-05-04 MED ORDER — DOXYCYCLINE HYCLATE 100 MG PO CAPS: 100.0000 mg | ORAL_CAPSULE | Freq: Two times a day (BID) | ORAL | Status: DC

## 2015-05-04 MED ORDER — BENZONATATE 100 MG PO CAPS: 100.0000 mg | ORAL_CAPSULE | Freq: Three times a day (TID) | ORAL | Status: DC | PRN

## 2015-05-04 MED ORDER — ONDANSETRON HCL 4 MG/2ML IJ SOLN
4.0000 mg | Freq: Once | INTRAMUSCULAR | Status: AC
  Administered 2015-05-04: 4 mg via INTRAVENOUS
  Filled 2015-05-04: qty 2

## 2015-05-04 MED ORDER — DIPHENHYDRAMINE HCL 50 MG/ML IJ SOLN
25.0000 mg | Freq: Once | INTRAMUSCULAR | Status: AC
  Administered 2015-05-04: 25 mg via INTRAVENOUS
  Filled 2015-05-04: qty 1

## 2015-05-04 NOTE — ED Notes (Signed)
Patient transported to X-ray via stretcher, sr x 2 up also with cont oxygen at 2lpm via [place]
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
## 2015-05-04 NOTE — ED Notes (Signed)
Family at bedside. 

## 2015-05-04 NOTE — ED Notes (Signed)
Pt c/o itching at site of IV and about 3 inches up arm. Dr Ward aware.

## 2015-05-04 NOTE — ED Notes (Signed)
Remains on cont cardiac monitor, NBP int q 30 min and cont POX monitoring

## 2015-05-04 NOTE — ED Notes (Signed)
POX at 86% on RA, EDP informed, pt placed on 2lpm via [address]

## 2015-05-04 NOTE — ED Notes (Signed)
12 lead EKG done, pt placed on cont cardiac/ POX monitoring

## 2015-05-04 NOTE — ED Notes (Signed)
EKG given to EDP while at bedside

## 2015-05-04 NOTE — Discharge Instructions (Signed)
Hemoptysis Hemoptysis, which means coughing up blood, can be a sign of a minor problem or a serious medical condition. The blood that is coughed up may come from the lungs and airways. Coughed-up blood can also come from bleeding that occurs outside the lungs and airways. Blood can drain into the windpipe during a severe nosebleed or when blood is vomited from the stomach. Because hemoptysis can be a sign of something serious, a medical evaluation is required. For some people with hemoptysis, no definite cause is ever identified. CAUSES  The most common cause of hemoptysis is bronchitis. Some other common causes include:   A ruptured blood vessel caused by coughing or an infection.   A medical condition that causes damage to the large air passageways (bronchiectasis).   A blood clot in the lungs (pulmonary embolism).   Pneumonia.   Tuberculosis.   Breathing in a small foreign object.   Cancer. For some people with hemoptysis, no definite cause is ever identified.  HOME CARE INSTRUCTIONS  Only take over-the-counter or prescription medicines as directed by your caregiver. Do not use cough suppressants unless your caregiver approves.  If your caregiver prescribes antibiotic medicines, take them as directed. Finish them even if you start to feel better.  Do not smoke. Also avoid secondhand smoke.  Follow up with your caregiver as directed. SEEK IMMEDIATE MEDICAL CARE IF:   You cough up bloody mucus for longer than a week.  You have a blood-producing cough that is severe or getting worse.  You have a blood-producing cough thatcomes and goes over time.  You develop problems with your breathing.   You vomit blood.  You develop bloody or black-colored stools.  You have chest pain.   You develop night sweats.  You feel faint or pass out.   You have a fever or persistent symptoms for more than 2-3 days.  You have a fever and your symptoms suddenly get worse. MAKE  SURE YOU:  Understand these instructions.  Will watch your condition.  Will get help right away if you are not doing well or get worse. Document Released: 01/20/2002 Document Revised: 10/28/2012 Document Reviewed: 08/28/2012 [facility] Patient Information 2015 [place], [place]. This information is not intended to replace advice given to you by your health care provider. Make sure you discuss any questions you have with your health care provider.   Acute Bronchitis Bronchitis is inflammation of the airways that extend from the windpipe into the lungs (bronchi). The inflammation often causes mucus to develop. This leads to a cough, which is the most common symptom of bronchitis.  In acute bronchitis, the condition usually develops suddenly and goes away over time, usually in a couple weeks. Smoking, allergies, and asthma can make bronchitis worse. Repeated episodes of bronchitis may cause further lung problems.  CAUSES Acute bronchitis is most often caused by the same virus that causes a cold. The virus can spread from person to person (contagious) through coughing, sneezing, and touching contaminated objects. SIGNS AND SYMPTOMS   Cough.   Fever.   Coughing up mucus.   Body aches.   Chest congestion.   Chills.   Shortness of breath.   Sore throat.  DIAGNOSIS  Acute bronchitis is usually diagnosed through a physical exam. Your health care provider will also ask you questions about your medical history. Tests, such as chest X-rays, are sometimes done to rule out other conditions.  TREATMENT  Acute bronchitis usually goes away in a couple weeks. Oftentimes, no medical treatment is  necessary. Medicines are sometimes given for relief of fever or cough. Antibiotic medicines are usually not needed but may be prescribed in certain situations. In some cases, an inhaler may be recommended to help reduce shortness of breath and control the cough. A cool mist vaporizer may also be used to  help thin bronchial secretions and make it easier to clear the chest.  HOME CARE INSTRUCTIONS  Get plenty of rest.   Drink enough fluids to keep your urine clear or pale yellow (unless you have a medical condition that requires fluid restriction). Increasing fluids may help thin your respiratory secretions (sputum) and reduce chest congestion, and it will prevent dehydration.   Take medicines only as directed by your health care provider.  If you were prescribed an antibiotic medicine, finish it all even if you start to feel better.  Avoid smoking and secondhand smoke. Exposure to cigarette smoke or irritating chemicals will make bronchitis worse. If you are a smoker, consider using nicotine gum or skin patches to help control withdrawal symptoms. Quitting smoking will help your lungs heal faster.   Reduce the chances of another bout of acute bronchitis by washing your hands frequently, avoiding people with cold symptoms, and trying not to touch your hands to your mouth, nose, or eyes.   Keep all follow-up visits as directed by your health care provider.  SEEK MEDICAL CARE IF: Your symptoms do not improve after 1 week of treatment.  SEEK IMMEDIATE MEDICAL CARE IF:  You develop an increased fever or chills.   You have chest pain.   You have severe shortness of breath.  You have bloody sputum.   You develop dehydration.  You faint or repeatedly feel like you are going to pass out.  You develop repeated vomiting.  You develop a severe headache. MAKE SURE YOU:   Understand these instructions.  Will watch your condition.  Will get help right away if you are not doing well or get worse. Document Released: 12/19/2004 Document Revised: 03/28/2014 Document Reviewed: 05/04/2013 [facility] Patient Information 2015 [place], [place]. This information is not intended to replace advice given to you by your health care provider. Make sure you discuss any questions you have with your  health care provider.

## 2015-05-04 NOTE — ED Notes (Signed)
C/o coughing up blood-bilat rib pain x 3 days-worse on left

## 2015-05-04 NOTE — ED Notes (Signed)
DC note: discharge teaching done with pt and family, stressed importance of taking and completing all abx prescribed by EDP and importance of making and keeping follow up MD appointment. Pt was able to state back important dc instructions. Pt dc to exit via w/c w/ family

## 2015-05-04 NOTE — ED Notes (Signed)
MD at bedside. 

## 2015-07-13 ENCOUNTER — Emergency Department (HOSPITAL_BASED_OUTPATIENT_CLINIC_OR_DEPARTMENT_OTHER)
Admission: EM | Admit: 2015-07-13 | Discharge: 2015-07-13 | Disposition: A | Discharge status: Home / Self Care | Payer: Medicaid Other | Attending: Emergency Medicine | Admitting: Emergency Medicine

## 2015-07-13 ENCOUNTER — Encounter (HOSPITAL_BASED_OUTPATIENT_CLINIC_OR_DEPARTMENT_OTHER): Payer: Self-pay | Admitting: *Deleted

## 2015-07-13 DIAGNOSIS — Z87828 Personal history of other (healed) physical injury and trauma: Secondary | ICD-10-CM | POA: Diagnosis not present

## 2015-07-13 DIAGNOSIS — G8929 Other chronic pain: Secondary | ICD-10-CM | POA: Insufficient documentation

## 2015-07-13 DIAGNOSIS — K219 Gastro-esophageal reflux disease without esophagitis: Secondary | ICD-10-CM | POA: Diagnosis not present

## 2015-07-13 DIAGNOSIS — J45909 Unspecified asthma, uncomplicated: Secondary | ICD-10-CM | POA: Diagnosis not present

## 2015-07-13 DIAGNOSIS — R630 Anorexia: Secondary | ICD-10-CM | POA: Insufficient documentation

## 2015-07-13 DIAGNOSIS — Z87891 Personal history of nicotine dependence: Secondary | ICD-10-CM | POA: Diagnosis not present

## 2015-07-13 DIAGNOSIS — R51 Headache: Secondary | ICD-10-CM | POA: Insufficient documentation

## 2015-07-13 DIAGNOSIS — M545 Low back pain: Secondary | ICD-10-CM | POA: Diagnosis not present

## 2015-07-13 DIAGNOSIS — F419 Anxiety disorder, unspecified: Secondary | ICD-10-CM | POA: Diagnosis not present

## 2015-07-13 DIAGNOSIS — K5900 Constipation, unspecified: Secondary | ICD-10-CM | POA: Diagnosis not present

## 2015-07-13 DIAGNOSIS — E119 Type 2 diabetes mellitus without complications: Secondary | ICD-10-CM | POA: Insufficient documentation

## 2015-07-13 DIAGNOSIS — I10 Essential (primary) hypertension: Secondary | ICD-10-CM | POA: Insufficient documentation

## 2015-07-13 DIAGNOSIS — M79652 Pain in left thigh: Secondary | ICD-10-CM | POA: Insufficient documentation

## 2015-07-13 DIAGNOSIS — M79651 Pain in right thigh: Secondary | ICD-10-CM | POA: Diagnosis not present

## 2015-07-13 DIAGNOSIS — M549 Dorsalgia, unspecified: Secondary | ICD-10-CM

## 2015-07-13 DIAGNOSIS — Z79899 Other long term (current) drug therapy: Secondary | ICD-10-CM | POA: Insufficient documentation

## 2015-07-13 DIAGNOSIS — H538 Other visual disturbances: Secondary | ICD-10-CM | POA: Diagnosis not present

## 2015-07-13 LAB — BASIC METABOLIC PANEL
ANION GAP: 9 (ref 5–15)
BUN: 12 mg/dL (ref 6–20)
CO2: 28 mmol/L (ref 22–32)
CREATININE: 0.87 mg/dL (ref 0.44–1.00)
Calcium: 9.3 mg/dL (ref 8.9–10.3)
Chloride: 100 mmol/L — ABNORMAL LOW (ref 101–111)
Glucose, Bld: 117 mg/dL — ABNORMAL HIGH (ref 65–99)
Potassium: 4.6 mmol/L (ref 3.5–5.1)
Sodium: 137 mmol/L (ref 135–145)

## 2015-07-13 LAB — CBG MONITORING, ED: Glucose-Capillary: 102 mg/dL — ABNORMAL HIGH (ref 65–99)

## 2015-07-13 MED ORDER — DIPHENHYDRAMINE HCL 25 MG PO CAPS
50.0000 mg | ORAL_CAPSULE | Freq: Once | ORAL | Status: AC
  Administered 2015-07-13: 50 mg via ORAL
  Filled 2015-07-13: qty 2

## 2015-07-13 MED ORDER — HYDROMORPHONE HCL 1 MG/ML IJ SOLN
2.0000 mg | Freq: Once | INTRAMUSCULAR | Status: AC
  Administered 2015-07-13: 2 mg via INTRAMUSCULAR
  Filled 2015-07-13: qty 2

## 2015-07-13 MED ORDER — CYCLOBENZAPRINE HCL 10 MG PO TABS
10.0000 mg | ORAL_TABLET | Freq: Once | ORAL | Status: AC
  Administered 2015-07-13: 10 mg via ORAL
  Filled 2015-07-13: qty 1

## 2015-07-13 NOTE — ED Notes (Signed)
Pt has been out of metformin for a month.  Last time she checked her sugar was early last week and it was 121
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
## 2015-07-13 NOTE — Discharge Instructions (Signed)

## 2015-07-13 NOTE — ED Notes (Signed)
Pt here with chronic back pain that is worse due to her being out of the medication.  Pt is out of her medication due to her missing her appointment with her doctors at [facility].  Pt is also out of multiple other medications due to having been unable to travel to [facility] to see her doctors.

## 2015-07-13 NOTE — ED Notes (Signed)
MD at bedside. 

## 2015-09-18 ENCOUNTER — Encounter: Payer: Self-pay | Admitting: Gastroenterology

## 2015-09-30 ENCOUNTER — Emergency Department (HOSPITAL_BASED_OUTPATIENT_CLINIC_OR_DEPARTMENT_OTHER)
Admission: EM | Admit: 2015-09-30 | Discharge: 2015-09-30 | Disposition: A | Discharge status: Home / Self Care | Payer: Medicaid Other | Attending: Physician Assistant | Admitting: Physician Assistant

## 2015-09-30 ENCOUNTER — Emergency Department (HOSPITAL_BASED_OUTPATIENT_CLINIC_OR_DEPARTMENT_OTHER): Payer: Medicaid Other

## 2015-09-30 ENCOUNTER — Encounter (HOSPITAL_BASED_OUTPATIENT_CLINIC_OR_DEPARTMENT_OTHER): Payer: Self-pay | Admitting: *Deleted

## 2015-09-30 DIAGNOSIS — J45909 Unspecified asthma, uncomplicated: Secondary | ICD-10-CM | POA: Insufficient documentation

## 2015-09-30 DIAGNOSIS — R0789 Other chest pain: Secondary | ICD-10-CM | POA: Diagnosis not present

## 2015-09-30 DIAGNOSIS — E119 Type 2 diabetes mellitus without complications: Secondary | ICD-10-CM | POA: Diagnosis not present

## 2015-09-30 DIAGNOSIS — Z792 Long term (current) use of antibiotics: Secondary | ICD-10-CM | POA: Diagnosis not present

## 2015-09-30 DIAGNOSIS — G894 Chronic pain syndrome: Secondary | ICD-10-CM | POA: Insufficient documentation

## 2015-09-30 DIAGNOSIS — F419 Anxiety disorder, unspecified: Secondary | ICD-10-CM | POA: Insufficient documentation

## 2015-09-30 DIAGNOSIS — Z87891 Personal history of nicotine dependence: Secondary | ICD-10-CM | POA: Diagnosis not present

## 2015-09-30 DIAGNOSIS — M545 Low back pain: Secondary | ICD-10-CM | POA: Diagnosis not present

## 2015-09-30 DIAGNOSIS — M791 Myalgia: Secondary | ICD-10-CM | POA: Diagnosis present

## 2015-09-30 DIAGNOSIS — Z87828 Personal history of other (healed) physical injury and trauma: Secondary | ICD-10-CM | POA: Insufficient documentation

## 2015-09-30 DIAGNOSIS — Z7984 Long term (current) use of oral hypoglycemic drugs: Secondary | ICD-10-CM | POA: Diagnosis not present

## 2015-09-30 DIAGNOSIS — Z9181 History of falling: Secondary | ICD-10-CM | POA: Insufficient documentation

## 2015-09-30 DIAGNOSIS — I10 Essential (primary) hypertension: Secondary | ICD-10-CM | POA: Diagnosis not present

## 2015-09-30 DIAGNOSIS — Z7951 Long term (current) use of inhaled steroids: Secondary | ICD-10-CM | POA: Diagnosis not present

## 2015-09-30 DIAGNOSIS — K219 Gastro-esophageal reflux disease without esophagitis: Secondary | ICD-10-CM | POA: Insufficient documentation

## 2015-09-30 DIAGNOSIS — Z79899 Other long term (current) drug therapy: Secondary | ICD-10-CM | POA: Diagnosis not present

## 2015-09-30 DIAGNOSIS — Z9889 Other specified postprocedural states: Secondary | ICD-10-CM | POA: Insufficient documentation

## 2015-09-30 MED ORDER — OXYCODONE-ACETAMINOPHEN 5-325 MG PO TABS
1.0000 | ORAL_TABLET | Freq: Once | ORAL | Status: AC
  Administered 2015-09-30: 1 via ORAL
  Filled 2015-09-30: qty 1

## 2015-09-30 MED ORDER — OXYCODONE-ACETAMINOPHEN 5-325 MG PO TABS
1.0000 | ORAL_TABLET | Freq: Four times a day (QID) | ORAL | Status: DC | PRN
Start: 2015-09-30 — End: 2016-01-10

## 2015-09-30 NOTE — ED Notes (Signed)
Pt is here with generalized body aches since the beginning of the week.  Pt states that she feels like there is heaviness in her chest but no other symptoms.

## 2015-09-30 NOTE — Discharge Instructions (Signed)
°Emergency Department Resource Guide °1) Find a Doctor and Pay Out of Pocket °Although you won't have to find out who is covered by your insurance plan, it is a good idea to ask around and get recommendations. You will then need to call the office and see if the doctor you have chosen will accept you as a new patient and what types of options they offer for patients who are self-pay. Some doctors offer discounts or will set up payment plans for their patients who do not have insurance, but you will need to ask so you aren't surprised when you get to your appointment. ° °2) Contact Your Local Health Department °Not all health departments have doctors that can see patients for sick visits, but many do, so it is worth a call to see if yours does. If you don't know where your local health department is, you can check in your phone book. The CDC also has a tool to help you locate your state's health department, and many state websites also have listings of all of their local health departments. ° °3) Find a Walk-in Clinic °If your illness is not likely to be very severe or complicated, you may want to try a walk in clinic. These are popping up all over the country in pharmacies, drugstores, and shopping centers. They're usually staffed by nurse practitioners or physician assistants that have been trained to treat common illnesses and complaints. They're usually fairly quick and inexpensive. However, if you have serious medical issues or chronic medical problems, these are probably not your best option. ° °No Primary Care Doctor: °- Call [facility] at  832-8000 - they can help you locate a primary care doctor that  accepts your insurance, provides certain services, etc. °- Physician Referral Service- 1-800-533-3463 ° °Chronic Pain Problems: °Organization         Address  Phone   Notes  °[facility]  (336) 297-2271 Patients need to be referred by their primary care doctor.  ° °Medication  Assistance: °Organization         Address  Phone   Notes  °[place] [facility] [address] °[place], [place] [address] (336) 641-8030 --Must be a resident of [place] °-- Must have NO insurance coverage whatsoever (no Medicaid/ Medicare, etc.) °-- The pt. MUST have a primary care doctor that directs their care regularly and follows them in the community °  °[facility]  (866) 331-1348   °[employer]  (888) 892-1162   ° °Agencies that provide inexpensive medical care: °Organization         Address  Phone   Notes  °[facility]  (336) 832-8035   °[facility]    (336) 832-7272   °[facility] [address] °[place], [place] [address] (336) 832-4777   °[facility] [address], °[place] (336) 271-4999   °[facility]    (336) 373-0678   °[facility]    (336) 272-1050   °[facility] ° [address], [place] Phone:  (336) 832-4444, Fax:  (336) 832-4440 Hours of Operation:  9 am - 6 pm, M-F.  Also accepts Medicaid/Medicare and self-pay.  °[facility] ° [address], [place] Phone: (336) 832-3150, Fax: (336) [phone]. Hours of Operation:  8:30 am - 5:30 pm, M-F.  Also accepts Medicaid and self-pay.  °[facility] [place] [address], [place] Phone: (336) 878-6027   °[facility] [address], [place], [place] (336)723-1848, Ext. 123 Mondays & Thursdays: 7-9 AM.  First 15 patients are seen on a first come, first serve basis. °  ° °Medicaid-accepting [place] Providers: ° °Organization         Address  Phone   Notes  °[facility] [address], [place] (336) 641-2100 Also accepts self-pay patients.  °[facility] [address], [place] ° (336) 856-9996   °[facility] [address], [place]  (336) 288-8857   °[facility] [address] [place] [address], [place] (336) 299-7000   °Veita Bland [address], [place]  ° (336) 373-1557 Only accepts [place] Access Medicaid patients after they have their name applied to their card.  ° °Self-Pay (no insurance) in [place]: ° °Organization         Address  Phone   Notes  °Sickle Cell Patients, [facility] [address], [place] (336) 832-1970   °[facility] [address], [place] (336) 832-4400   °[facility] ° [address], [place] (336) 992-4800   °[facility]/Dr. Osei-Bonsu ° [address] [place] [address], [place] or [address], [place] (336) [phone] Phone number for both [place] and [place] locations is the same.  °[facility] [address], [place] (336) 299-0000   °[facility] [address] [place] [address], [place] or [address] (336) 852-7530 °(336) 878-2260   °[facility] [address], [place] (336) 350-1642, phone; (336) 294-5005, fax Sees patients 1st and 3rd Saturday of every month.  Must not qualify for public or private insurance (i.e. Medicaid, Medicare, [place] Health Choice, Veterans' Benefits) • Household income should be no more than 200% of the poverty level •The clinic cannot treat you if you are pregnant or think you are pregnant • Sexually transmitted diseases are not treated at the clinic.  ° ° °Dental Care: °Organization         Address  Phone  Notes  °[place] [facility] [address], [place] (336) 641-6152 Accepts children up to age 21 who are enrolled in Medicaid or [place] Health Choice; pregnant women with a Medicaid card; and children who have applied for Medicaid or [place] Health Choice, but were declined, whose parents can pay a reduced fee at time of service.  °[place]  [facility] [place]  [address], [place] (336) 641-7733 Accepts children up to age 21 who are enrolled in Medicaid or [place] Health Choice; pregnant women with a Medicaid card; and children who have applied for Medicaid or  Health Choice, but were declined, whose parents can pay a reduced fee at time of service.  °[facility] ° [address],  (336) 641-4533 Patients are seen by appointment only. Walk-ins are not accepted. [facility] will see patients 18 years of age and older. °Monday - Tuesday (8am-5pm) °Most Wednesdays (8:30-5pm) °$30 per visit, cash only  °[facility] ° [address], [place] (336) 641-4533 Patients are seen by appointment only. Walk-ins are not accepted. [facility] will see patients 18 years of age and older. °One   Wednesday Evening (Monthly: Volunteer Based).  $30 per visit, cash only  °[facility]  (919) 537-3737 for adults; Children under age 4, call Graduate Pediatric Dentistry at (919) [phone]. Children aged 4-14, please call (919) [phone] to request a pediatric application. ° Dental services are provided in all areas of dental care including fillings, crowns and bridges, complete and partial dentures, implants, gum treatment, root canals, and extractions. Preventive care is also provided. Treatment is provided to both adults and children. °Patients are selected via a lottery and there is often a waiting list. °  °[facility] [address], °[place] ° (336) 763-8833 [email or web address].drcivils.[email or web address] °  °[facility] [address], [place], [place] (336)723-1848, Ext. 123 Second and Fourth Thursday of each month, opens at 6:30 AM; Clinic ends at 9 AM.  Patients are seen on a first-come first-served basis, and a limited number are seen during each clinic.  ° °[facility] ° [address], [place], [place] (336) 723-7904    Eligibility Requirements °You must have lived in [place], [place], or [place] counties for at least the last three months. °  You cannot be eligible for state or federal sponsored healthcare insurance, including Veterans Administration, Medicaid, or Medicare. °  You generally cannot be eligible for healthcare insurance through your employer.  °  How to apply: °Eligibility screenings are held every Tuesday and Wednesday afternoon from 1:00 pm until 4:00 pm. You do not need an appointment for the interview!  °[facility] [address], [place], [place] 336-631-2330   °[facility]  336-342-8273   °[place]  336-703-3100   °[facility]  336-570-6415   ° °Behavioral Health Resources in the Community: °Intensive Outpatient Programs °Organization         Address  Phone  Notes  °[facility] [address], [place], [place] 336-878-6098   °[facility] [address], [place], [place] 336-832-9800   °[facility] [address], [place], [place] ° 336-882-2125   °[facility] [address],  °[place], [place] 1-800-853-5163 or 336-641-4981   °Substance Abuse Resources °Organization         Address  Phone  Notes  °[facility]  336-882-2125   °[facility]  336-784-9470   °[facility]  336-285-9073   °[facility]  336-845-3988   °Residential & Outpatient Substance Abuse Program  1-800-659-3381   °Psychological Services °Organization         Address  Phone  Notes  ° Health  336- 832-9600   °[facility]  336- 378-7881   °[facility] [address], [place] 1-800-853-5163 or 336-641-4981   ° °Mobile Crisis Teams °Organization         Address  Phone  Notes  °[facility], Mobile Crisis Care Unit  1-877-626-1772   °Assertive °[facility] ° [address]  [place], [place] 336-834-9664   °Sharon DeEsch [address] °[place] 336-554-5454   ° °Self-Help/Support Groups °Organization         Address  Phone             Notes  °[facility] - variety of support groups  336- 373-1402 Call for more information  °Narcotics Anonymous (NA), [facility] [address], °[place]  2 meetings at this location  ° °  Residential Treatment Programs Organization         Address  Phone  Notes  [facility] [address]    [place]  1-(479)325-6330   [facility]  [address] [place] [address], [place], [place]   [place], [place] [phone] Admissions: 8am-3pm M-F  [facility] [place] [address],    [place], [place] 449-675-9163   [facility] [address] [place], [place], [place]   [facility] [address]  [place], [place]   [facility] Intensive Outpatient [address], [place], [place]   [facility] [place].,  [place], [place] 1-910-502-8636 or 234-495-4280   [facility] [address] [place], [place] Accepts Medicaid  [facility] [address]  [place] 1-740-051-0855 Substance Abuse/Addiction Treatment   [facility]         Address  Phone  Notes  [facility]  (214) 033-1948   Domenic Schwab, PhD [address] Arlis [place]a [place], [place]   318-883-1376 or 864-402-4532   [place], [place] (512) 544-4679   [facility] [address], [place], [place] (949)877-7979 Insurance/Medicaid/sponsorship through [facility] [address] [place]                                    [place], [place] [phone] [place] [address], [place] (323)[phone]    Dr. Adele Schilder  [phone]   [facility] Dept. 1) [address], [place] 2) [address], [place] 3)  [place] [address], [place] ([phone] [phone]  [phone]   [place] [phone] or [phone] (After Hours)      Chronic Pain Chronic pain can be defined as pain that is off and on and lasts for 3-6 months or longer. Many things cause chronic pain, which can make it difficult to make a diagnosis. There are many treatment options available for chronic pain. However, finding a treatment that works well for you may require trying various approaches until the right one is found. Many people benefit from a combination of two or more types of treatment to control their pain. SYMPTOMS  Chronic pain can occur anywhere in the body and can range from mild to very severe. Some types of chronic pain include:  Headache.  Low back pain.  Cancer pain.  Arthritis pain.  Neurogenic pain. This is pain resulting from damage to nerves. People with chronic pain may also have other symptoms such as:  Depression.  Anger.  Insomnia.  Anxiety. DIAGNOSIS  Your health care provider will help diagnose your condition over time. In many cases, the initial focus will be on excluding possible conditions that could be causing the pain. Depending on your symptoms, your health care provider may order tests to diagnose your condition. Some of these tests may include:   Blood tests.   CT scan.   MRI.   X-rays.   Ultrasounds.   Nerve conduction studies.  You may need to see a specialist.  TREATMENT  Finding treatment that works well may take time. You may be referred to a pain specialist. He or she may prescribe medicine or therapies, such as:   Mindful meditation or yoga.  Shots (injections) of numbing or  pain-relieving medicines into the spine or area of pain.  Local electrical stimulation.  Acupuncture.   Massage therapy.   Aroma, color, light, or sound therapy.   Biofeedback.   Working with a physical therapist to keep from getting stiff.   Regular, gentle exercise.   Cognitive or behavioral therapy.   Group support.  Sometimes, surgery may be recommended.  HOME CARE INSTRUCTIONS   Take all medicines as directed by your health care provider.   Lessen stress in your life by relaxing and doing things such as listening to calming music.   Exercise or be active as directed by your health care provider.   Eat a healthy diet and include things such as vegetables, fruits, fish, and lean meats in your diet.   Keep all follow-up appointments with your health care provider.   Attend a support group with others suffering from chronic pain. SEEK MEDICAL CARE IF:   Your pain gets worse.   You develop a new pain that was not there before.   You cannot tolerate medicines given to you by your health care provider.   You have new symptoms since your last visit with your health care provider.  SEEK IMMEDIATE MEDICAL CARE IF:   You feel weak.   You have decreased sensation or numbness.   You lose control of bowel or bladder function.   Your pain suddenly gets much worse.   You develop shaking.  You develop chills.  You develop confusion.  You develop chest pain.  You develop shortness of breath.  MAKE SURE YOU:  Understand these instructions.  Will watch your condition.  Will get help right away if you are not doing well or get worse.   This information is not intended to replace advice given to you by your health care provider. Make sure you discuss any questions you have with your health care provider.   Document Released: 08/03/2002 Document Revised: 07/14/2013 Document Reviewed: 05/07/2013 Elsevier Interactive Patient Education [employer]
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
## 2015-10-04 ENCOUNTER — Emergency Department (HOSPITAL_BASED_OUTPATIENT_CLINIC_OR_DEPARTMENT_OTHER)
Admission: EM | Admit: 2015-10-04 | Discharge: 2015-10-04 | Disposition: A | Discharge status: Home / Self Care | Payer: Medicaid Other | Attending: Emergency Medicine | Admitting: Emergency Medicine

## 2015-10-04 ENCOUNTER — Emergency Department (HOSPITAL_BASED_OUTPATIENT_CLINIC_OR_DEPARTMENT_OTHER): Payer: Medicaid Other

## 2015-10-04 ENCOUNTER — Encounter (HOSPITAL_BASED_OUTPATIENT_CLINIC_OR_DEPARTMENT_OTHER): Payer: Self-pay

## 2015-10-04 DIAGNOSIS — Z8739 Personal history of other diseases of the musculoskeletal system and connective tissue: Secondary | ICD-10-CM | POA: Insufficient documentation

## 2015-10-04 DIAGNOSIS — F1123 Opioid dependence with withdrawal: Secondary | ICD-10-CM | POA: Insufficient documentation

## 2015-10-04 DIAGNOSIS — E119 Type 2 diabetes mellitus without complications: Secondary | ICD-10-CM | POA: Insufficient documentation

## 2015-10-04 DIAGNOSIS — Z87828 Personal history of other (healed) physical injury and trauma: Secondary | ICD-10-CM | POA: Insufficient documentation

## 2015-10-04 DIAGNOSIS — G8929 Other chronic pain: Secondary | ICD-10-CM | POA: Diagnosis not present

## 2015-10-04 DIAGNOSIS — K219 Gastro-esophageal reflux disease without esophagitis: Secondary | ICD-10-CM | POA: Insufficient documentation

## 2015-10-04 DIAGNOSIS — J45909 Unspecified asthma, uncomplicated: Secondary | ICD-10-CM | POA: Diagnosis not present

## 2015-10-04 DIAGNOSIS — R079 Chest pain, unspecified: Secondary | ICD-10-CM | POA: Diagnosis present

## 2015-10-04 DIAGNOSIS — Z79899 Other long term (current) drug therapy: Secondary | ICD-10-CM | POA: Diagnosis not present

## 2015-10-04 DIAGNOSIS — Z7951 Long term (current) use of inhaled steroids: Secondary | ICD-10-CM | POA: Insufficient documentation

## 2015-10-04 DIAGNOSIS — F419 Anxiety disorder, unspecified: Secondary | ICD-10-CM | POA: Insufficient documentation

## 2015-10-04 DIAGNOSIS — Z87891 Personal history of nicotine dependence: Secondary | ICD-10-CM | POA: Diagnosis not present

## 2015-10-04 DIAGNOSIS — F1193 Opioid use, unspecified with withdrawal: Secondary | ICD-10-CM

## 2015-10-04 DIAGNOSIS — I10 Essential (primary) hypertension: Secondary | ICD-10-CM | POA: Diagnosis not present

## 2015-10-04 LAB — COMPREHENSIVE METABOLIC PANEL
ALK PHOS: 146 U/L — AB (ref 38–126)
ALT: 12 U/L — ABNORMAL LOW (ref 14–54)
AST: 15 U/L (ref 15–41)
Albumin: 4.4 g/dL (ref 3.5–5.0)
Anion gap: 11 (ref 5–15)
BILIRUBIN TOTAL: 0.4 mg/dL (ref 0.3–1.2)
BUN: 9 mg/dL (ref 6–20)
CALCIUM: 9.2 mg/dL (ref 8.9–10.3)
CO2: 25 mmol/L (ref 22–32)
Chloride: 103 mmol/L (ref 101–111)
Creatinine, Ser: 0.88 mg/dL (ref 0.44–1.00)
GFR calc Af Amer: >60 mL/min (ref >60)
GFR calc non Af Amer: >60 mL/min (ref >60)
GLUCOSE: 167 mg/dL — AB (ref 65–99)
Potassium: 3.5 mmol/L (ref 3.5–5.1)
SODIUM: 139 mmol/L (ref 135–145)
Total Protein: 8.8 g/dL — ABNORMAL HIGH (ref 6.5–8.1)

## 2015-10-04 LAB — CBC WITH DIFFERENTIAL/PLATELET
Basophils Absolute: 0 10*3/uL (ref 0.0–0.1)
Basophils Relative: 0 %
Eosinophils Absolute: 0.1 10*3/uL (ref 0.0–0.7)
Eosinophils Relative: 1 %
HEMATOCRIT: 39.1 % (ref 36.0–46.0)
HEMOGLOBIN: 12.1 g/dL (ref 12.0–15.0)
Lymphocytes Relative: 20 %
Lymphs Abs: 2.5 10*3/uL (ref 0.7–4.0)
MCH: 25 pg — ABNORMAL LOW (ref 26.0–34.0)
MCHC: 30.9 g/dL (ref 30.0–36.0)
MCV: 80.8 fL (ref 78.0–100.0)
MONOS PCT: 6 %
Monocytes Absolute: 0.8 10*3/uL (ref 0.1–1.0)
Neutro Abs: 8.9 10*3/uL — ABNORMAL HIGH (ref 1.7–7.7)
Neutrophils Relative %: 73 %
Platelets: 551 10*3/uL — ABNORMAL HIGH (ref 150–400)
RBC: 4.84 MIL/uL (ref 3.87–5.11)
RDW: 15.7 % — ABNORMAL HIGH (ref 11.5–15.5)
WBC: 12.3 10*3/uL — AB (ref 4.0–10.5)

## 2015-10-04 LAB — TROPONIN I: Troponin I: <0.03 ng/mL (ref <0.031)

## 2015-10-04 MED ORDER — LOPERAMIDE HCL 2 MG PO CAPS
2.0000 mg | ORAL_CAPSULE | Freq: Four times a day (QID) | ORAL | Status: DC | PRN
Start: 2015-10-04 — End: 2018-12-07

## 2015-10-04 MED ORDER — ONDANSETRON HCL 4 MG/2ML IJ SOLN: 4.0000 mg | Freq: Once | INTRAMUSCULAR | Status: DC

## 2015-10-04 MED ORDER — PROMETHAZINE HCL 25 MG PO TABS: 25.0000 mg | ORAL_TABLET | Freq: Four times a day (QID) | ORAL | Status: DC | PRN

## 2015-10-04 MED ORDER — SODIUM CHLORIDE 0.9 % IV BOLUS (SEPSIS)
1000.0000 mL | Freq: Once | INTRAVENOUS | Status: AC
  Administered 2015-10-04: 1000 mL via INTRAVENOUS

## 2015-10-04 MED ORDER — CLONIDINE HCL 0.2 MG PO TABS: 0.2000 mg | ORAL_TABLET | Freq: Two times a day (BID) | ORAL | Status: DC | PRN

## 2015-10-04 MED ORDER — CLONIDINE HCL 0.1 MG PO TABS
0.1000 mg | ORAL_TABLET | Freq: Once | ORAL | Status: AC
  Administered 2015-10-04: 0.1 mg via ORAL
  Filled 2015-10-04: qty 1

## 2015-10-04 MED ORDER — PROMETHAZINE HCL 25 MG/ML IJ SOLN
25.0000 mg | Freq: Once | INTRAMUSCULAR | Status: AC
  Administered 2015-10-04: 25 mg via INTRAVENOUS
  Filled 2015-10-04: qty 1

## 2015-10-04 NOTE — ED Notes (Addendum)
Pt c/o CP x "few days-pain to "all my bones" x 1 week-pt is crying/hyperventilating- states she is out of her meds x 4 days-has 2 empty bottles of oxycodone-one is 30mg  one is 5 mg-states she does not have a PCP any longer

## 2015-10-04 NOTE — ED Notes (Signed)
Patient states she has had generalized body pain from arthritis and chest pain for the last three days after running out of her oxycodone pills for her pain.  States her doctor is in [place] and she can not get a ride to see him.
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
## 2015-10-04 NOTE — Discharge Instructions (Signed)
Opioid Withdrawal Opioids are a group of narcotic drugs. They include the street drug heroin. They also include pain medicines, such as morphine, hydrocodone, oxycodone, and fentanyl. Opioid withdrawal is a group of characteristic physical and mental signs and symptoms. It typically occurs if you have been using opioids daily for several weeks or longer and stop using or rapidly decrease use. Opioid withdrawal can also occur if you have used opioids daily for a long time and are given a medicine to block the effect.  SIGNS AND SYMPTOMS Opioid withdrawal includes three or more of the following symptoms:   Depressed, anxious, or irritable mood.  Nausea or vomiting.  Muscle aches or spasms.   Watery eyes.   Runny nose.  Dilated pupils, sweating, or hairs standing on end.  Diarrhea or intestinal cramping.  Yawning.   Fever.  Increased blood pressure.  Fast pulse.  Restlessness or trouble sleeping. These signs and symptoms occur within several hours of stopping or reducing short-acting opioids, such as heroin. They can occur within 3 days of stopping or reducing long-acting opioids, such as methadone. Withdrawal begins within minutes of receiving a drug that blocks the effects of opioids, such as naltrexone or naloxone. DIAGNOSIS  Opioid use disorder is diagnosed by your health care provider. You will be asked about your symptoms, drug and alcohol use, medical history, and use of medicines. A physical exam may be done. Lab tests may be ordered. Your health care provider may have you see a mental health professional.  TREATMENT  The treatment for opioid withdrawal is usually provided by medical doctors with special training in substance use disorders (addiction specialists). The following medicines may be included in treatment:  Opioids given in place of the abused opioid. They turn on opioid receptors in the brain and lessen or prevent withdrawal symptoms. They are gradually  decreased (opioid substitution and taper).  Non-opioids that can lessen certain opioid withdrawal symptoms. They may be used alone or with opioid substitution and taper. Successful long-term recovery usually requires medicine, counseling, and group support. HOME CARE INSTRUCTIONS   Take medicines only as directed by your health care provider.  Check with your health care provider before starting new medicines.  Keep all follow-up visits as directed by your health care provider. SEEK MEDICAL CARE IF:  You are not able to take your medicines as directed.  Your symptoms get worse.  You relapse. SEEK IMMEDIATE MEDICAL CARE IF:  You have serious thoughts about hurting yourself or others.  You have a seizure.  You lose consciousness.   This information is not intended to replace advice given to you by your health care provider. Make sure you discuss any questions you have with your health care provider.   Document Released: 11/14/2003 Document Revised: 12/02/2014 Document Reviewed: 11/24/2013 Elsevier Interactive Patient Education 2016 [employer].    Emergency Department Resource Guide 1) Find a Doctor and Pay Out of Pocket Although you won't have to find out who is covered by your insurance plan, it is a good idea to ask around and get recommendations. You will then need to call the office and see if the doctor you have chosen will accept you as a new patient and what types of options they offer for patients who are self-pay. Some doctors offer discounts or will set up payment plans for their patients who do not have insurance, but you will need to ask so you aren't surprised when you get to your appointment.  2) Contact Your Local  Health Department Not all health departments have doctors that can see patients for sick visits, but many do, so it is worth a call to see if yours does. If you don't know where your local health department is, you can check in your phone book. The CDC  also has a tool to help you locate your state's health department, and many state websites also have listings of all of their local health departments.  3) Find a [facility] If your illness is not likely to be very severe or complicated, you may want to try a walk in clinic. These are popping up all over the country in pharmacies, drugstores, and shopping centers. They're usually staffed by nurse practitioners or physician assistants that have been trained to treat common illnesses and complaints. They're usually fairly quick and inexpensive. However, if you have serious medical issues or chronic medical problems, these are probably not your best option.  No Primary Care Doctor: - Call Health Connect at  [phone] - they can help you locate a primary care doctor that  accepts your insurance, provides certain services, etc. - Physician Referral Service- [phone]  Chronic Pain Problems: Organization         Address  Phone   Notes  [facility]  [phone] Patients need to be referred by their primary care doctor.   Medication Assistance: Organization         Address  Phone   Notes  [facility] Medication [facility] [address] [place], [place] [address] [phone] --Must be a resident of [facility] -- Must have NO insurance coverage whatsoever (no Medicaid/ Medicare, etc.) -- The pt. MUST have a primary care doctor that directs their care regularly and follows them in the community   Med[facility]ist  [phone]   [employer]  [phone]    Agencies that provide inexpensive medical care: Organization         Address  Phone   Notes  [place]  [phone]   [facility]    [phone]   [facility] [place], [place] [address] [phone]   [place] [address] [place] [phone]   Planned Parenthood    [phone]   [facility]    [phone]   [place] and [place] [address], [place] Phone:  [phone], Fax:  [phone] Hours of Operation:  9 am - 6 pm, M-F.  Also accepts Medicaid/Medicare and self-pay.  [facility] for [place], [address] Phone: [phone], Fax: [phone]. Hours of Operation:  8:30 am - 5:30 pm, M-F.  Also accepts Medicaid and self-pay.  [facility] [place] [address] [place] Phone: [phone]   [place], [place], [place] [phone], Ext. [phone] Mondays & Thursdays: 7-9 AM.  First 15 patients are seen on a first come, first serve basis.    [place] Providers:  Organization         Address  Phone   Notes  [facility] [address], [place] [phone] Also accepts self-pay patients.  [place], [place]  [phone]   [place], [place], [place] [phone]  [place] [address], [place] [phone]   Lucianne Lei [address], [place]   [phone] Only accepts [place] Access [place] patients after they have their name applied to their card.   Self-Pay (no insurance) in [facility]:  Organization         Address  Phone   Notes  Sickle Cell Patients, [facility] [place] [phone]   [facility] [place] [phone]   [facility] [place]  [place], [place], [place] [phone]   [facility]/Dr. Osei-Bonsu  [address], [place] or [address], [place] [phone] Phone number for both [place] and [place] locations is the same.  [facility] [address], [place] [phone]   [facility] [address], [place] or [address] [phone]   [facility] [address], [place] [phone], phone; [phone], fax Sees patients 1st and 3rd Saturday of every month.  Must not qualify for public or private insurance (i.e. Medicaid, Medicare, [place] Health Choice, Veterans' Benefits)  Household income should be no more than 200% of the poverty level The clinic cannot treat you if you are pregnant or think you are pregnant  Sexually transmitted diseases are not treated at the clinic.    Dental Care: Organization         Address  Phone  Notes  [facility] Department of [facility] [place] [phone] Accepts children up to age 35 who are enrolled in [place] or [place]; pregnant women with a Medicaid card; and children who have applied for Medicaid or [place] Health Choice, but were declined, whose parents can pay a reduced fee at time of service.  [facility] Department of [facility]  [address], [place] [phone] Accepts children up to age 110 who are enrolled in [place] or [place]; pregnant women with a Medicaid card; and children who have applied for Medicaid or [place] Health Choice, but were declined, whose parents can pay a reduced fee at time of service.  [place] Adult [facility] Access PROGRAM  [place] [phone] Patients are seen by appointment only. Walk-ins are not accepted. [place] will see patients 73 years of age and older. Monday - Tuesday (8am-5pm) Most Wednesdays (8:30-5pm) $30 per visit, cash only  [facility] Adult Dental Access PROGRAM  [address], [place] [facility] [phone] Patients are seen by appointment only. Walk-ins are not accepted. [place] will see patients 67 years of age and older. One  Wednesday Evening (Monthly: Volunteer Based).  $30 per visit, cash only  [facility] [place]  [phone] for adults; Children under age 54, call Graduate Pediatric Dentistry at [phone]. Children aged 30-14, please call [phone] to request a pediatric application.  Dental services are provided in all areas of dental care including fillings, crowns and bridges, complete and partial dentures, implants, gum treatment, root canals, and extractions. Preventive care is also provided. Treatment is provided to both adults and children. Patients are selected via a lottery and there is often a waiting list.   [facility] [address] [place]  [phone] [email or web address]  [facility] [address] [place], [place] [phone], Ext. [phone] Second and Fourth Thursday of each month, opens at 6:30 AM; Clinic ends at 9 AM.  Patients are seen on a first-come first-served basis, and a limited number are seen during each clinic.   [facility]  [address] [place], [place] [phone]   Eligibility Requirements You must have lived in [place], [place], or [place] counties for at least the last three months.   You cannot be eligible for state or federal sponsored [employer], including [employer], [place], or [employer].   You generally cannot be eligible for healthcare insurance through your employer.    How to apply: Eligibility screenings are held every Tuesday and Wednesday afternoon from 1:00 pm until 4:00 pm. You do not need an appointment for the interview!  [facility] [address], [place], [place]   [place]  [place] Department  [place]  [phone]    Behavioral Health Resources in the Community: Intensive Outpatient Programs Organization          Address  Phone  Notes  [place]. [address] [place], [place] [phone]   [facility] Outpatient [address] [place], [place]   ADS: [facility] Drug [facility] [address] [place], [place]   [place] [address]  [place], [place] or [phone]   Substance Abuse Resources Organization         Address  Phone  Notes  Al[facility] Drug Services  [phone]   [facility]  [phone]   [facility] [place]  [phone]   [place]inita Pester  [phone]   Residential & Outpatient Substance Abuse Program  [phone]

## 2015-11-15 ENCOUNTER — Encounter (HOSPITAL_BASED_OUTPATIENT_CLINIC_OR_DEPARTMENT_OTHER): Payer: Self-pay

## 2015-11-15 ENCOUNTER — Emergency Department (HOSPITAL_BASED_OUTPATIENT_CLINIC_OR_DEPARTMENT_OTHER)
Admission: EM | Admit: 2015-11-15 | Discharge: 2015-11-15 | Disposition: A | Discharge status: Home / Self Care | Payer: Medicaid Other | Attending: Emergency Medicine | Admitting: Emergency Medicine

## 2015-11-15 DIAGNOSIS — Z792 Long term (current) use of antibiotics: Secondary | ICD-10-CM | POA: Diagnosis not present

## 2015-11-15 DIAGNOSIS — I10 Essential (primary) hypertension: Secondary | ICD-10-CM | POA: Insufficient documentation

## 2015-11-15 DIAGNOSIS — F419 Anxiety disorder, unspecified: Secondary | ICD-10-CM | POA: Diagnosis not present

## 2015-11-15 DIAGNOSIS — R51 Headache: Secondary | ICD-10-CM | POA: Insufficient documentation

## 2015-11-15 DIAGNOSIS — M542 Cervicalgia: Secondary | ICD-10-CM | POA: Diagnosis not present

## 2015-11-15 DIAGNOSIS — Z87891 Personal history of nicotine dependence: Secondary | ICD-10-CM | POA: Insufficient documentation

## 2015-11-15 DIAGNOSIS — G8929 Other chronic pain: Secondary | ICD-10-CM | POA: Diagnosis not present

## 2015-11-15 DIAGNOSIS — Z9889 Other specified postprocedural states: Secondary | ICD-10-CM | POA: Insufficient documentation

## 2015-11-15 DIAGNOSIS — J45909 Unspecified asthma, uncomplicated: Secondary | ICD-10-CM | POA: Insufficient documentation

## 2015-11-15 DIAGNOSIS — E119 Type 2 diabetes mellitus without complications: Secondary | ICD-10-CM | POA: Insufficient documentation

## 2015-11-15 DIAGNOSIS — K219 Gastro-esophageal reflux disease without esophagitis: Secondary | ICD-10-CM | POA: Diagnosis not present

## 2015-11-15 DIAGNOSIS — Z7984 Long term (current) use of oral hypoglycemic drugs: Secondary | ICD-10-CM | POA: Diagnosis not present

## 2015-11-15 DIAGNOSIS — Z7951 Long term (current) use of inhaled steroids: Secondary | ICD-10-CM | POA: Insufficient documentation

## 2015-11-15 DIAGNOSIS — Z9181 History of falling: Secondary | ICD-10-CM | POA: Diagnosis not present

## 2015-11-15 DIAGNOSIS — R519 Headache, unspecified: Secondary | ICD-10-CM

## 2015-11-15 DIAGNOSIS — M546 Pain in thoracic spine: Secondary | ICD-10-CM | POA: Diagnosis not present

## 2015-11-15 DIAGNOSIS — Z87828 Personal history of other (healed) physical injury and trauma: Secondary | ICD-10-CM | POA: Diagnosis not present

## 2015-11-15 MED ORDER — METHOCARBAMOL 500 MG PO TABS: 500.0000 mg | ORAL_TABLET | Freq: Three times a day (TID) | ORAL | Status: DC | PRN
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
## 2015-11-15 NOTE — ED Notes (Addendum)
C/o HA x 2 days with blurred vision-pt was here earlier approx 1 hour ago as a visitor with family member and did not check in at that time-NAD

## 2015-11-15 NOTE — Discharge Instructions (Signed)

## 2015-11-15 NOTE — ED Notes (Signed)
Patient states she has a two day history of headache and pain down both sides of her spine.  States she thinks that her mattress is bad at home.  Also, states she wakes up and feels like something has bitten her in her mouth and head.  States she sister took a video of her sleeping and thinks she has been having seizures at night.

## 2016-01-10 ENCOUNTER — Observation Stay (HOSPITAL_COMMUNITY)
Admission: EM | Admit: 2016-01-10 | Discharge: 2016-01-12 | Disposition: A | Discharge status: Home / Self Care | Payer: Medicaid Other | Attending: Internal Medicine | Admitting: Internal Medicine

## 2016-01-10 ENCOUNTER — Encounter (HOSPITAL_COMMUNITY): Payer: Self-pay | Admitting: *Deleted

## 2016-01-10 ENCOUNTER — Emergency Department (HOSPITAL_COMMUNITY): Payer: Medicaid Other

## 2016-01-10 DIAGNOSIS — G894 Chronic pain syndrome: Secondary | ICD-10-CM | POA: Diagnosis present

## 2016-01-10 DIAGNOSIS — Z91041 Radiographic dye allergy status: Secondary | ICD-10-CM | POA: Diagnosis not present

## 2016-01-10 DIAGNOSIS — D649 Anemia, unspecified: Secondary | ICD-10-CM | POA: Diagnosis not present

## 2016-01-10 DIAGNOSIS — J449 Chronic obstructive pulmonary disease, unspecified: Secondary | ICD-10-CM | POA: Diagnosis not present

## 2016-01-10 DIAGNOSIS — E1159 Type 2 diabetes mellitus with other circulatory complications: Secondary | ICD-10-CM | POA: Diagnosis present

## 2016-01-10 DIAGNOSIS — Z885 Allergy status to narcotic agent status: Secondary | ICD-10-CM | POA: Insufficient documentation

## 2016-01-10 DIAGNOSIS — E860 Dehydration: Secondary | ICD-10-CM

## 2016-01-10 DIAGNOSIS — E119 Type 2 diabetes mellitus without complications: Secondary | ICD-10-CM | POA: Diagnosis not present

## 2016-01-10 DIAGNOSIS — R51 Headache: Secondary | ICD-10-CM

## 2016-01-10 DIAGNOSIS — I6300 Cerebral infarction due to thrombosis of unspecified precerebral artery: Secondary | ICD-10-CM | POA: Diagnosis not present

## 2016-01-10 DIAGNOSIS — I272 Other secondary pulmonary hypertension: Secondary | ICD-10-CM | POA: Diagnosis not present

## 2016-01-10 DIAGNOSIS — J90 Pleural effusion, not elsewhere classified: Secondary | ICD-10-CM | POA: Insufficient documentation

## 2016-01-10 DIAGNOSIS — R531 Weakness: Secondary | ICD-10-CM | POA: Insufficient documentation

## 2016-01-10 DIAGNOSIS — E66812 Obesity, class 2: Secondary | ICD-10-CM | POA: Diagnosis present

## 2016-01-10 DIAGNOSIS — R29810 Facial weakness: Secondary | ICD-10-CM | POA: Diagnosis not present

## 2016-01-10 DIAGNOSIS — J45909 Unspecified asthma, uncomplicated: Secondary | ICD-10-CM | POA: Insufficient documentation

## 2016-01-10 DIAGNOSIS — E6609 Other obesity due to excess calories: Secondary | ICD-10-CM | POA: Diagnosis present

## 2016-01-10 DIAGNOSIS — I959 Hypotension, unspecified: Secondary | ICD-10-CM | POA: Diagnosis not present

## 2016-01-10 DIAGNOSIS — I639 Cerebral infarction, unspecified: Secondary | ICD-10-CM | POA: Diagnosis present

## 2016-01-10 DIAGNOSIS — Z6841 Body Mass Index (BMI) 40.0 and over, adult: Secondary | ICD-10-CM | POA: Diagnosis not present

## 2016-01-10 DIAGNOSIS — Z91013 Allergy to seafood: Secondary | ICD-10-CM | POA: Insufficient documentation

## 2016-01-10 DIAGNOSIS — Z881 Allergy status to other antibiotic agents status: Secondary | ICD-10-CM | POA: Diagnosis not present

## 2016-01-10 DIAGNOSIS — Z7984 Long term (current) use of oral hypoglycemic drugs: Secondary | ICD-10-CM | POA: Insufficient documentation

## 2016-01-10 DIAGNOSIS — I110 Hypertensive heart disease with heart failure: Secondary | ICD-10-CM | POA: Insufficient documentation

## 2016-01-10 DIAGNOSIS — N179 Acute kidney failure, unspecified: Secondary | ICD-10-CM | POA: Diagnosis present

## 2016-01-10 DIAGNOSIS — K219 Gastro-esophageal reflux disease without esophagitis: Secondary | ICD-10-CM | POA: Diagnosis not present

## 2016-01-10 DIAGNOSIS — I10 Essential (primary) hypertension: Secondary | ICD-10-CM | POA: Diagnosis present

## 2016-01-10 DIAGNOSIS — I509 Heart failure, unspecified: Secondary | ICD-10-CM | POA: Insufficient documentation

## 2016-01-10 DIAGNOSIS — R55 Syncope and collapse: Secondary | ICD-10-CM | POA: Insufficient documentation

## 2016-01-10 DIAGNOSIS — Z87891 Personal history of nicotine dependence: Secondary | ICD-10-CM | POA: Diagnosis not present

## 2016-01-10 DIAGNOSIS — R519 Headache, unspecified: Secondary | ICD-10-CM

## 2016-01-10 HISTORY — DX: Cerebral infarction, unspecified: I63.9

## 2016-01-10 LAB — DIFFERENTIAL
BASOS ABS: 0 10*3/uL (ref 0.0–0.1)
BASOS PCT: 0 %
EOS ABS: 0.1 10*3/uL (ref 0.0–0.7)
EOS PCT: 1 %
LYMPHS ABS: 2.4 10*3/uL (ref 0.7–4.0)
Lymphocytes Relative: 28 %
Monocytes Absolute: 1 10*3/uL (ref 0.1–1.0)
Monocytes Relative: 12 %
NEUTROS PCT: 59 %
Neutro Abs: 5 10*3/uL (ref 1.7–7.7)

## 2016-01-10 LAB — I-STAT CHEM 8, ED
BUN: 14 mg/dL (ref 6–20)
CREATININE: 1.5 mg/dL — AB (ref 0.44–1.00)
Calcium, Ion: 1 mmol/L — ABNORMAL LOW (ref 1.12–1.23)
Chloride: 107 mmol/L (ref 101–111)
Glucose, Bld: 112 mg/dL — ABNORMAL HIGH (ref 65–99)
HEMATOCRIT: 39 % (ref 36.0–46.0)
HEMOGLOBIN: 13.3 g/dL (ref 12.0–15.0)
POTASSIUM: 5.6 mmol/L — AB (ref 3.5–5.1)
SODIUM: 137 mmol/L (ref 135–145)
TCO2: 25 mmol/L (ref 0–100)

## 2016-01-10 LAB — URINALYSIS, ROUTINE W REFLEX MICROSCOPIC
BILIRUBIN URINE: NEGATIVE
Glucose, UA: NEGATIVE mg/dL
HGB URINE DIPSTICK: NEGATIVE
KETONES UR: 15 mg/dL — AB
Leukocytes, UA: NEGATIVE
Nitrite: NEGATIVE
PH: 5.5 (ref 5.0–8.0)
Protein, ur: NEGATIVE mg/dL
SPECIFIC GRAVITY, URINE: 1.01 (ref 1.005–1.030)

## 2016-01-10 LAB — RAPID URINE DRUG SCREEN, HOSP PERFORMED
AMPHETAMINES: NOT DETECTED
BENZODIAZEPINES: POSITIVE — AB
Barbiturates: NOT DETECTED
Cocaine: NOT DETECTED
OPIATES: POSITIVE — AB
TETRAHYDROCANNABINOL: NOT DETECTED

## 2016-01-10 LAB — CBC
HCT: 36.6 % (ref 36.0–46.0)
Hemoglobin: 10.6 g/dL — ABNORMAL LOW (ref 12.0–15.0)
MCH: 22.1 pg — AB (ref 26.0–34.0)
MCHC: 29 g/dL — AB (ref 30.0–36.0)
MCV: 76.4 fL — ABNORMAL LOW (ref 78.0–100.0)
PLATELETS: 386 10*3/uL (ref 150–400)
RBC: 4.79 MIL/uL (ref 3.87–5.11)
RDW: 16.2 % — AB (ref 11.5–15.5)
WBC: 8.5 10*3/uL (ref 4.0–10.5)

## 2016-01-10 LAB — COMPREHENSIVE METABOLIC PANEL
ALBUMIN: 4 g/dL (ref 3.5–5.0)
ALT: 40 U/L (ref 14–54)
ANION GAP: 12 (ref 5–15)
AST: 55 U/L — AB (ref 15–41)
Alkaline Phosphatase: 99 U/L (ref 38–126)
BILIRUBIN TOTAL: 1.2 mg/dL (ref 0.3–1.2)
BUN: 10 mg/dL (ref 6–20)
CHLORIDE: 105 mmol/L (ref 101–111)
CO2: 24 mmol/L (ref 22–32)
Calcium: 9.2 mg/dL (ref 8.9–10.3)
Creatinine, Ser: 1.49 mg/dL — ABNORMAL HIGH (ref 0.44–1.00)
GFR calc Af Amer: 44 mL/min — ABNORMAL LOW (ref >60)
GFR calc non Af Amer: 38 mL/min — ABNORMAL LOW (ref >60)
GLUCOSE: 114 mg/dL — AB (ref 65–99)
POTASSIUM: 3.2 mmol/L — AB (ref 3.5–5.1)
SODIUM: 141 mmol/L (ref 135–145)
TOTAL PROTEIN: 8.1 g/dL (ref 6.5–8.1)

## 2016-01-10 LAB — I-STAT TROPONIN, ED: Troponin i, poc: 0.02 ng/mL (ref 0.00–0.08)

## 2016-01-10 LAB — CBG MONITORING, ED: Glucose-Capillary: 112 mg/dL — ABNORMAL HIGH (ref 65–99)

## 2016-01-10 MED ORDER — IOHEXOL 350 MG/ML SOLN
100.0000 mL | Freq: Once | INTRAVENOUS | Status: AC | PRN
  Administered 2016-01-10: 100 mL via INTRAVENOUS

## 2016-01-10 MED ORDER — SODIUM CHLORIDE 0.9 % IV BOLUS (SEPSIS)
1000.0000 mL | Freq: Once | INTRAVENOUS | Status: AC
  Administered 2016-01-10: 1000 mL via INTRAVENOUS

## 2016-01-10 MED ORDER — FENTANYL CITRATE (PF) 100 MCG/2ML IJ SOLN
50.0000 ug | Freq: Once | INTRAMUSCULAR | Status: AC
  Administered 2016-01-10: 50 ug via INTRAVENOUS
  Filled 2016-01-10: qty 2
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
## 2016-01-10 NOTE — ED Notes (Signed)
Dr. Tobias Alexander arrived to evaluate pt.

## 2016-01-10 NOTE — ED Notes (Signed)
Pt transported to CT scan.
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
## 2016-01-11 ENCOUNTER — Other Ambulatory Visit (HOSPITAL_COMMUNITY): Payer: Self-pay

## 2016-01-11 ENCOUNTER — Inpatient Hospital Stay (HOSPITAL_COMMUNITY): Payer: Medicaid Other

## 2016-01-11 ENCOUNTER — Encounter (HOSPITAL_COMMUNITY): Payer: Self-pay | Admitting: Radiology

## 2016-01-11 ENCOUNTER — Observation Stay (HOSPITAL_BASED_OUTPATIENT_CLINIC_OR_DEPARTMENT_OTHER): Payer: Medicaid Other

## 2016-01-11 DIAGNOSIS — I272 Other secondary pulmonary hypertension: Secondary | ICD-10-CM | POA: Diagnosis not present

## 2016-01-11 DIAGNOSIS — E1159 Type 2 diabetes mellitus with other circulatory complications: Secondary | ICD-10-CM

## 2016-01-11 DIAGNOSIS — I6789 Other cerebrovascular disease: Secondary | ICD-10-CM | POA: Diagnosis not present

## 2016-01-11 DIAGNOSIS — G894 Chronic pain syndrome: Secondary | ICD-10-CM

## 2016-01-11 DIAGNOSIS — I10 Essential (primary) hypertension: Secondary | ICD-10-CM | POA: Diagnosis not present

## 2016-01-11 DIAGNOSIS — F449 Dissociative and conversion disorder, unspecified: Secondary | ICD-10-CM | POA: Diagnosis not present

## 2016-01-11 DIAGNOSIS — N179 Acute kidney failure, unspecified: Secondary | ICD-10-CM | POA: Diagnosis not present

## 2016-01-11 DIAGNOSIS — I110 Hypertensive heart disease with heart failure: Secondary | ICD-10-CM | POA: Diagnosis not present

## 2016-01-11 LAB — CBC
HCT: 35.5 % — ABNORMAL LOW (ref 36.0–46.0)
HEMATOCRIT: 33.3 % — AB (ref 36.0–46.0)
HEMOGLOBIN: 10.1 g/dL — AB (ref 12.0–15.0)
HEMOGLOBIN: 9.6 g/dL — AB (ref 12.0–15.0)
MCH: 22 pg — ABNORMAL LOW (ref 26.0–34.0)
MCH: 22.6 pg — ABNORMAL LOW (ref 26.0–34.0)
MCHC: 28.5 g/dL — AB (ref 30.0–36.0)
MCHC: 28.8 g/dL — ABNORMAL LOW (ref 30.0–36.0)
MCV: 77.2 fL — ABNORMAL LOW (ref 78.0–100.0)
MCV: 78.4 fL (ref 78.0–100.0)
Platelets: 396 10*3/uL (ref 150–400)
Platelets: 371 10*3/uL (ref 150–400)
RBC: 4.6 MIL/uL (ref 3.87–5.11)
RBC: 4.25 MIL/uL (ref 3.87–5.11)
RDW: 16.4 % — AB (ref 11.5–15.5)
RDW: 16.6 % — ABNORMAL HIGH (ref 11.5–15.5)
WBC: 9.1 10*3/uL (ref 4.0–10.5)
WBC: 8.3 10*3/uL (ref 4.0–10.5)

## 2016-01-11 LAB — COMPREHENSIVE METABOLIC PANEL
ALT: 39 U/L (ref 14–54)
ANION GAP: 10 (ref 5–15)
AST: 42 U/L — ABNORMAL HIGH (ref 15–41)
Albumin: 3.4 g/dL — ABNORMAL LOW (ref 3.5–5.0)
Alkaline Phosphatase: 90 U/L (ref 38–126)
BILIRUBIN TOTAL: 0.5 mg/dL (ref 0.3–1.2)
BUN: 7 mg/dL (ref 6–20)
CHLORIDE: 110 mmol/L (ref 101–111)
CO2: 23 mmol/L (ref 22–32)
Calcium: 8.6 mg/dL — ABNORMAL LOW (ref 8.9–10.3)
Creatinine, Ser: 0.75 mg/dL (ref 0.44–1.00)
GFR calc Af Amer: >60 mL/min (ref >60)
Glucose, Bld: 122 mg/dL — ABNORMAL HIGH (ref 65–99)
POTASSIUM: 3.8 mmol/L (ref 3.5–5.1)
Sodium: 143 mmol/L (ref 135–145)
TOTAL PROTEIN: 7.2 g/dL (ref 6.5–8.1)

## 2016-01-11 LAB — GLUCOSE, CAPILLARY
GLUCOSE-CAPILLARY: 115 mg/dL — AB (ref 65–99)
GLUCOSE-CAPILLARY: 141 mg/dL — AB (ref 65–99)
GLUCOSE-CAPILLARY: 135 mg/dL — AB (ref 65–99)
Glucose-Capillary: 105 mg/dL — ABNORMAL HIGH (ref 65–99)

## 2016-01-11 LAB — CREATININE, SERUM: CREATININE: 0.9 mg/dL (ref 0.44–1.00)

## 2016-01-11 LAB — LIPID PANEL
Cholesterol: 141 mg/dL (ref 0–200)
HDL: 39 mg/dL — AB (ref >40)
LDL CALC: 84 mg/dL (ref 0–99)
TRIGLYCERIDES: 90 mg/dL (ref <150)
Total CHOL/HDL Ratio: 3.6 RATIO
VLDL: 18 mg/dL (ref 0–40)

## 2016-01-11 LAB — TROPONIN I
Troponin I: <0.03 ng/mL (ref <0.031)
Troponin I: <0.03 ng/mL (ref <0.031)

## 2016-01-11 LAB — PROTIME-INR
INR: 1.09 (ref 0.00–1.49)
PROTHROMBIN TIME: 14.3 s (ref 11.6–15.2)

## 2016-01-11 LAB — APTT: aPTT: <20 seconds — ABNORMAL LOW (ref 24–37)

## 2016-01-11 MED ORDER — OXYCODONE-ACETAMINOPHEN 5-325 MG PO TABS
1.0000 | ORAL_TABLET | ORAL | Status: DC | PRN
  Administered 2016-01-11: 1 via ORAL
  Filled 2016-01-11: qty 1

## 2016-01-11 MED ORDER — INSULIN ASPART 100 UNIT/ML ~~LOC~~ SOLN
0.0000 [IU] | Freq: Three times a day (TID) | SUBCUTANEOUS | Status: DC
  Administered 2016-01-11 – 2016-01-12 (×3): 1 [IU] via SUBCUTANEOUS

## 2016-01-11 MED ORDER — KETOROLAC TROMETHAMINE 15 MG/ML IJ SOLN: 15.0000 mg | Freq: Four times a day (QID) | INTRAMUSCULAR | Status: DC | PRN

## 2016-01-11 MED ORDER — ASPIRIN 325 MG PO TABS
325.0000 mg | ORAL_TABLET | Freq: Every day | ORAL | Status: DC
  Administered 2016-01-12: 325 mg via ORAL
  Filled 2016-01-11 (×2): qty 1

## 2016-01-11 MED ORDER — MOMETASONE FURO-FORMOTEROL FUM 100-5 MCG/ACT IN AERO
2.0000 | INHALATION_SPRAY | Freq: Two times a day (BID) | RESPIRATORY_TRACT | Status: DC
  Administered 2016-01-11: 2 via RESPIRATORY_TRACT
  Filled 2016-01-11: qty 8.8

## 2016-01-11 MED ORDER — KETOROLAC TROMETHAMINE 30 MG/ML IJ SOLN
30.0000 mg | Freq: Four times a day (QID) | INTRAMUSCULAR | Status: DC | PRN
  Administered 2016-01-11 – 2016-01-12 (×3): 30 mg via INTRAVENOUS
  Filled 2016-01-11 (×3): qty 1

## 2016-01-11 MED ORDER — BUSPIRONE HCL 10 MG PO TABS
10.0000 mg | ORAL_TABLET | Freq: Two times a day (BID) | ORAL | Status: DC
  Administered 2016-01-11 – 2016-01-12 (×4): 10 mg via ORAL
  Filled 2016-01-11 (×4): qty 1

## 2016-01-11 MED ORDER — ASPIRIN 300 MG RE SUPP
300.0000 mg | Freq: Every day | RECTAL | Status: DC
  Administered 2016-01-11: 300 mg via RECTAL
  Filled 2016-01-11: qty 1

## 2016-01-11 MED ORDER — LORAZEPAM 2 MG/ML IJ SOLN
1.0000 mg | Freq: Two times a day (BID) | INTRAMUSCULAR | Status: DC | PRN
  Administered 2016-01-11 – 2016-01-12 (×2): 1 mg via INTRAVENOUS
  Filled 2016-01-11 (×2): qty 1

## 2016-01-11 MED ORDER — CITALOPRAM HYDROBROMIDE 10 MG PO TABS
20.0000 mg | ORAL_TABLET | Freq: Every day | ORAL | Status: DC
  Administered 2016-01-11 – 2016-01-12 (×2): 20 mg via ORAL
  Filled 2016-01-11 (×2): qty 2

## 2016-01-11 MED ORDER — CYCLOBENZAPRINE HCL 10 MG PO TABS
10.0000 mg | ORAL_TABLET | Freq: Three times a day (TID) | ORAL | Status: DC | PRN
Start: 2016-01-11 — End: 2016-01-12
  Filled 2016-01-11: qty 1

## 2016-01-11 MED ORDER — PANTOPRAZOLE SODIUM 40 MG PO TBEC
40.0000 mg | DELAYED_RELEASE_TABLET | Freq: Every day | ORAL | Status: DC
  Administered 2016-01-11 – 2016-01-12 (×2): 40 mg via ORAL
  Filled 2016-01-11 (×2): qty 1

## 2016-01-11 MED ORDER — LORAZEPAM 1 MG PO TABS: 1.0000 mg | ORAL_TABLET | Freq: Two times a day (BID) | ORAL | Status: DC | PRN

## 2016-01-11 MED ORDER — OXYCODONE HCL 5 MG PO TABS
5.0000 mg | ORAL_TABLET | ORAL | Status: DC | PRN
  Administered 2016-01-11: 5 mg via ORAL
  Filled 2016-01-11: qty 1

## 2016-01-11 MED ORDER — SODIUM CHLORIDE 0.9 % IV SOLN
INTRAVENOUS | Status: AC
  Administered 2016-01-11: 1000 mL via INTRAVENOUS

## 2016-01-11 MED ORDER — OXYCODONE-ACETAMINOPHEN 10-325 MG PO TABS: 1.0000 | ORAL_TABLET | ORAL | Status: DC | PRN

## 2016-01-11 MED ORDER — OXYCODONE-ACETAMINOPHEN 5-325 MG PO TABS
1.0000 | ORAL_TABLET | ORAL | Status: DC | PRN
  Administered 2016-01-11 (×2): 2 via ORAL
  Filled 2016-01-11 (×2): qty 2

## 2016-01-11 MED ORDER — ENOXAPARIN SODIUM 40 MG/0.4ML ~~LOC~~ SOLN
40.0000 mg | Freq: Every day | SUBCUTANEOUS | Status: DC
  Administered 2016-01-11 – 2016-01-12 (×2): 40 mg via SUBCUTANEOUS
  Filled 2016-01-11 (×2): qty 0.4

## 2016-01-11 MED ORDER — DOCUSATE SODIUM 100 MG PO CAPS: 100.0000 mg | ORAL_CAPSULE | Freq: Every day | ORAL | Status: DC | PRN

## 2016-01-11 MED ORDER — GABAPENTIN 300 MG PO CAPS
300.0000 mg | ORAL_CAPSULE | Freq: Three times a day (TID) | ORAL | Status: DC
  Administered 2016-01-11 – 2016-01-12 (×5): 300 mg via ORAL
  Filled 2016-01-11 (×5): qty 1

## 2016-01-11 MED ORDER — STROKE: EARLY STAGES OF RECOVERY BOOK
Freq: Once | Status: DC
  Filled 2016-01-11: qty 1

## 2016-01-11 MED ORDER — TIOTROPIUM BROMIDE MONOHYDRATE 18 MCG IN CAPS
18.0000 ug | ORAL_CAPSULE | Freq: Every day | RESPIRATORY_TRACT | Status: DC
  Administered 2016-01-12: 18 ug via RESPIRATORY_TRACT
  Filled 2016-01-11: qty 5

## 2016-01-11 MED ORDER — ALBUTEROL SULFATE (2.5 MG/3ML) 0.083% IN NEBU
2.5000 mg | INHALATION_SOLUTION | Freq: Four times a day (QID) | RESPIRATORY_TRACT | Status: DC | PRN
  Administered 2016-01-11: 2.5 mg via RESPIRATORY_TRACT
  Filled 2016-01-11: qty 3

## 2016-01-11 MED ORDER — OXYCODONE-ACETAMINOPHEN 5-325 MG PO TABS
1.0000 | ORAL_TABLET | Freq: Three times a day (TID) | ORAL | Status: DC | PRN
  Administered 2016-01-12 (×2): 2 via ORAL
  Filled 2016-01-11 (×2): qty 2

## 2016-01-11 MED ORDER — ATORVASTATIN CALCIUM 10 MG PO TABS
10.0000 mg | ORAL_TABLET | Freq: Every day | ORAL | Status: DC
  Administered 2016-01-11 – 2016-01-12 (×2): 10 mg via ORAL
  Filled 2016-01-11 (×2): qty 1
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
## 2016-01-11 NOTE — Progress Notes (Signed)
SLP Note:  Discussed pt with RN, who reports that pt will not eat the pureed diet. Pt had been offered more solid textures during MBS, but declined them due to baseline difficulty with mastication due to ill-fitting dentures. Her speech and ROM had started to considerably improve by early afternoon when this study was completed. Given her previous level of function and her adequate oral function on MBS aside from oral holding, feel she would be appropriate to trial Dys 2 textures under full supervision. Should pt exhibit oral holding or any overt signs of aspiration, would downgrade back to Dys 1 diet. SLP to f/u on next date for tolerance.  Germain Osgood, M.A. CCC-SLP [phone]

## 2016-01-11 NOTE — Progress Notes (Signed)
MBSS complete. Full report located under chart review in imaging section.  Teresa Wiley, M.A. CCC-SLP [phone]  

## 2016-01-11 NOTE — Progress Notes (Signed)
During change of shift and rounding this morning, patient was noted in bed, complaining of severe pain to mid chest and lower back, unable to move extremities and also having difficulty speaking. MD made aware of patient's complains and medicated. During next rounding, patient was sociable, talking without difficulty and moving extremities not knowing she is still being assessed. As the day goes by, patient is able to talk clearly and even got up out of bed and ambulated to the bathroom with walker and standby assist. Patient could not move left leg this morning but can walk to bathroom without limping this evening. MD aware of progress.

## 2016-01-11 NOTE — Progress Notes (Signed)
Patient is complaining of difficulty breathing put her back on 2 litre O2 and called respiratory for PRN  Albuterol breathing treatment, will continue to monitor.

## 2016-01-11 NOTE — Evaluation (Signed)
Occupational Therapy Evaluation Patient Details Name: Teresa Wiley MRN: [id number] DOB: Mar 25, 1956 Today's Date: 01/11/2016    History of Present Illness 60 y.o. female with history of chronic back pain (recently discharged from [facility] pain management clinic due to too many missed appointments--per pt); morbid obesity, COPD, pulmonary hypertension, CHF, diabetes mellitus was brought to the ER after patient had a syncopal episode following which patient required brief CPR and was found to have left-sided weakness. Head CT, CT angiogram and MRI negative for acute findings.    Clinical Impression   Pt admitted with above. She demonstrates the below listed deficits and will benefit from continued OT to maximize safety and independence with BADLs.  Pt presents to OT with significant inconsistencies with strength and function throughout eval.  She is very histrionic and at times difficult to redirect to task.   She deferred OOB activity due to fatigue.  Overall, she requires min - mod A for ADLs, but anticipate she is self limiting and may be able to do more than she did during eval.   Recommend that she has 24 hour supervision/assist at discharge, and she reports and extensive history of falls.   She may need SNF due to fall risk.       Follow Up Recommendations  Supervision/Assistance - 24 hour - may need SNF if she doesn't progress to baseline quickly.    Equipment Recommendations  None recommended by OT    Recommendations for Other Services       Precautions / Restrictions Precautions Precautions: Fall Precaution Comments: pt reports frequent falls at home due to leg weakness (although only outside, never falls inside)      Mobility Bed Mobility Overal bed mobility: Modified Independent Bed Mobility: Rolling;Sidelying to Sit;Sit to Supine Rolling: Min assist Sidelying to sit: Min assist   Sit to supine: Min assist   General bed mobility comments: Pt uses UEs to assist Lt LE off  of and onto bed   Transfers Overall transfer level: Needs assistance Equipment used: Rolling walker (2 wheeled) (bedrail) Transfers: Sit to/from Stand Sit to Stand: Min guard         General transfer comment: Pt did not attempt sit to stand due to fatigue     Balance Overall balance assessment: History of Falls;Needs assistance Sitting-balance support: No upper extremity supported;Feet unsupported Sitting balance-Leahy Scale: Good     Standing balance support: Bilateral upper extremity supported Standing balance-Leahy Scale: Poor                              ADL Overall ADL's : Needs assistance/impaired Eating/Feeding: Independent;Sitting;Bed level   Grooming: Oral care;Wash/dry face;Wash/dry hands;Brushing hair;Set up;Sitting   Upper Body Bathing: Set up;Sitting   Lower Body Bathing: Minimal assistance;Bed level Lower Body Bathing Details (indicate cue type and reason): Pt deferred sit to stand due to fatigue  Upper Body Dressing : Set up;Sitting   Lower Body Dressing: Minimal assistance;Bed level Lower Body Dressing Details (indicate cue type and reason): Pt leaned forward to doff socks and noted to struggle signficantly.  She then sat up, and crossed her ankles over her knees to don the socks with little to no effort    Toilet Transfer Details (indicate cue type and reason): Pt deferred transfer due to pt fatigue                  Vision Additional Comments: Pt reports long term blurry vision  that  she questions might be related to her diabetes.  She reports that she can only read her phone and read if she is wearing reading glasses.   She reports she has not seen an ophthalmologist since 2005.  Explained importance of yearly eye exams.   Pt denies diplopia.  She does demonstrate dysconjugate gaze with exotopia of Rt eye noted.    Perception Perception Perception Tested?: Yes   Praxis Praxis Praxis tested?: Within functional limits    Pertinent  Vitals/Pain Pain Assessment: Faces Faces Pain Scale: Hurts little more Pain Location: headache Pain Descriptors / Indicators: Headache Pain Intervention(s): Monitored during session     Hand Dominance Right   Extremity/Trunk Assessment Upper Extremity Assessment Upper Extremity Assessment: LUE deficits/detail;RUE deficits/detail RUE Deficits / Details: Pt reports long standing numbness and tingling in the ulnar nerve distribution Lt hand  RUE Sensation: history of peripheral neuropathy (Pt reports she was told she has peripheral neuropathy ) LUE Deficits / Details: Inconsistencies noted with Lt UE strength testing MMT revealed strength 4-/5 - 5/5    Lower Extremity Assessment Lower Extremity Assessment: Defer to PT evaluation LLE Deficits / Details: very inconsistent results; when asked to lift leg off bed, there is much apparent effort, however no increased pressure through RLE to stabilize; unable to even bring knee to chest in supine, yet able to stand without buckling x 2 minutes and perform mini-squats LLE Sensation: history of peripheral neuropathy   Cervical / Trunk Assessment Cervical / Trunk Assessment: Normal Cervical / Trunk Exceptions: obese   Communication Communication Communication: No difficulties   Cognition Arousal/Alertness: Awake/alert Behavior During Therapy: WFL for tasks assessed/performed Overall Cognitive Status: Within Functional Limits for tasks assessed (Pt histrionic and timeline information doesn't add up )                     General Comments       Exercises Exercises: Other exercises Other Exercises Other Exercises: AAROM LLE prior to sit EOB   Shoulder Instructions      Home Living Family/patient expects to be discharged to:: Private residence Living Arrangements: Spouse/significant other;Children (wife; teenage son) Available Help at Discharge: Family Type of Home: Other(Comment) (townhouse) Home Access: Stairs to  enter Technical brewer of Steps: 3 Entrance Stairs-Rails: Right;Left ("concrete walls") Home Layout: Two level;Bed/bath upstairs;Other (Comment) (son uses downstairs bedroom and bathroom) Alternate Level Stairs-Number of Steps: 14 Alternate Level Stairs-Rails: Right;Left;Can reach both Bathroom Shower/Tub: Occupational psychologist: Standard     Home Equipment: None          Prior Functioning/Environment Level of Independence: Needs assistance  Gait / Transfers Assistance Needed: stayed upstairs mostly (due to progressive weakness and heart "racing"); if goes downstairs or outside it is only with 2 men helping her; denies falls inside home, falls "everytime" when goes outside (that's why she only goes with men assisting her) ADL's / Homemaking Assistance Needed: washes up at sink mostly (got rid of shower seat because she didn't like the holes in the seat and gave her skin "sores" everywhere there was a drain hole   Comments: Pt reports she stopped cooking and cleaning several years ago.   She reports she does drive    OT Diagnosis: Generalized weakness   OT Problem List: Decreased strength;Decreased activity tolerance;Impaired balance (sitting and/or standing);Decreased knowledge of use of DME or AE;Obesity   OT Treatment/Interventions: Self-care/ADL training;Therapeutic exercise;DME and/or AE instruction;Therapeutic activities;Patient/family education;Balance training    OT Goals(Current goals can be found  in the care plan section) Acute Rehab OT Goals Patient Stated Goal: to get better  OT Goal Formulation: With patient Time For Goal Achievement: 01/25/16 Potential to Achieve Goals: Good ADL Goals Pt Will Perform Grooming: with modified independence;standing Pt Will Perform Upper Body Bathing: with modified independence;sitting;standing Pt Will Perform Lower Body Bathing: with modified independence;sit to/from stand Pt Will Perform Upper Body Dressing: with  modified independence;sitting;standing Pt Will Perform Lower Body Dressing: with modified independence;sit to/from stand Pt Will Transfer to Toilet: with modified independence;ambulating;regular height toilet;bedside commode;grab bars Pt Will Perform Toileting - Clothing Manipulation and hygiene: with modified independence;sit to/from stand  OT Frequency: Min 2X/week   Barriers to D/C:            Co-evaluation              End of Session Nurse Communication: Mobility status  Activity Tolerance: Patient limited by fatigue Patient left: in bed;with call bell/phone within reach;with bed alarm set;with family/visitor present   Time: [id number] OT Time Calculation (min): 45 min Charges:  OT General Charges $OT Visit: 1 Procedure OT Evaluation $OT Eval Moderate Complexity: 1 Procedure OT Treatments $Self Care/Home Management : 8-22 mins $Therapeutic Activity: 8-22 mins G-Codes: OT G-codes **NOT FOR INPATIENT CLASS** Functional Limitation: Self care Self Care Current Status [id number]): At least 40 percent but less than 60 percent impaired, limited or restricted Self Care Goal Status [id number]): At least 1 percent but less than 20 percent impaired, limited or restricted  Carline Dura M 01/11/2016, 5:47 PM

## 2016-01-11 NOTE — Progress Notes (Signed)
  Echocardiogram 2D Echocardiogram has been performed.  Jennette Dubin 01/11/2016, 4:00 PM

## 2016-01-11 NOTE — Progress Notes (Signed)
STROKE TEAM PROGRESS NOTE   HISTORY OF PRESENT ILLNESS Teresa Wiley is a 60 y.o. female who was in her normal state of health before going to bed last night. She woke this morning and was laying in bed talking to her family member and then stood up to walk around and stumbled. She then laid down and went back to sleep. On awakening around noon, she complained of difficulty with her vision. She did, however decided to go out and shop for food. Throughout this time. She was saying that something was not right. She then had what appeared to be a syncopal spell in the grocery store and on coming around was noticed to have severe left-sided weakness and EMS was called. Sudden worsening of left-sided weakness occurred at 5:30 PM. Due to being out of the window a code stroke was not called. However, given that the patient had an abrupt worsening at 5:30 PM she was taken for CT angio/perfusion which did not show any large vessel occlusion. She was last known well to 01/09/2016 prior to going to bed. Patient was not administered TPA secondary to being out of the window. She was admitted for further evaluation and treatment.   SUBJECTIVE (INTERVAL HISTORY) Her student nurse is at the bedside.  Overall she feels her condition is stable. She still complains of left sided heaviness but MRI and MRA negative. She also complains of HA, back pain, not sleep well.    OBJECTIVE Temp:  [97.6 F (36.4 C)-99.1 F (37.3 C)] 99.1 F (37.3 C) (02/16 1116) Pulse Rate:  [75-94] 83 (02/16 1116) Cardiac Rhythm:  [-] Normal sinus rhythm (02/16 0140) Resp:  [12-25] 14 (02/16 1116) BP: (94-139)/(51-80) 117/70 mmHg (02/16 1116) SpO2:  [82 %-100 %] 100 % (02/16 1116) Weight:  [108.863 kg (240 lb)-111.721 kg (246 lb 4.8 oz)] 111.721 kg (246 lb 4.8 oz) (02/16 0100)  CBC:  Recent Labs Lab 01/10/16 1902  01/11/16 0211 01/11/16 1105  WBC 8.5  --  9.1 8.3  NEUTROABS 5.0  --   --   --   HGB 10.6*  < > 10.1* 9.6*  HCT 36.6   < > 35.5* 33.3*  MCV 76.4*  --  77.2* 78.4  PLT 386  --  396 371  < > = values in this interval not displayed.  Basic Metabolic Panel:   Recent Labs Lab 01/10/16 1902 01/10/16 1913 01/11/16 0211  NA 141 137  --   K 3.2* 5.6*  --   CL 105 107  --   CO2 24  --   --   GLUCOSE 114* 112*  --   BUN 10 14  --   CREATININE 1.49* 1.50* 0.90  CALCIUM 9.2  --   --     Lipid Panel:     Component Value Date/Time   CHOL 141 01/11/2016 0222   TRIG 90 01/11/2016 0222   HDL 39* 01/11/2016 0222   CHOLHDL 3.6 01/11/2016 0222   VLDL 18 01/11/2016 0222   LDLCALC 84 01/11/2016 0222   HgbA1c:  Lab Results  Component Value Date   HGBA1C 6.8* 10/17/2014   Urine Drug Screen:     Component Value Date/Time   LABOPIA POSITIVE* 01/10/2016 2022   COCAINSCRNUR NONE DETECTED 01/10/2016 2022   LABBENZ POSITIVE* 01/10/2016 2022   AMPHETMU NONE DETECTED 01/10/2016 2022   THCU NONE DETECTED 01/10/2016 2022   LABBARB NONE DETECTED 01/10/2016 2022      IMAGING I have personally reviewed the radiological images below  and agree with the radiology interpretations.  Dg Chest 1 View 01/11/2016   Mild bibasilar atelectasis   Ct Head Wo Contrast 01/10/2016   Stable exam.  Normal CT evaluation of the brain.   Ct Angio Head & Neck W/cm &/or Wo/cm 01/10/2016   Negative CTA head and neck.   Mr Brain Wo Contrast 01/11/2016   Negative exam.  No acute intracranial findings.   Ct Cerebral Perfusion W/cm 01/10/2016  Negative CT perfusion.  No evidence of acute infarct or ischemia.   Dg Chest Port 1 View 01/10/2016  Cardiac enlargement. Small bilateral pleural effusions with basilar atelectasis.   2D Echocardiogram  pending    Physical exam  Temp:  [97.6 F (36.4 C)-99.1 F (37.3 C)] 98.6 F (37 C) (02/16 1740) Pulse Rate:  [75-87] 87 (02/16 1740) Resp:  [12-25] 18 (02/16 1740) BP: (96-139)/(64-80) 102/68 mmHg (02/16 1740) SpO2:  [82 %-100 %] 97 % (02/16 1740) Weight:  [246 lb 4.8 oz (111.721  kg)] 246 lb 4.8 oz (111.721 kg) (02/16 0100)  General - morbid obesity, well developed, in mild distress due to HA and back pain.  Ophthalmologic - Fundi not visualized due to noncooperation.  Cardiovascular - Regular rate and rhythm.  Mental Status -  Level of arousal and orientation to time, place, and person were intact. Language including expression, naming, repetition, comprehension was assessed and found intact. Fund of Knowledge was assessed and was intact.  Cranial Nerves II - XII - II - Visual field intact OU. III, IV, VI - Extraocular movements intact. V - Facial sensation decreased on the left subjectively. VII - Facial movement intact bilaterally. VIII - Hearing & vestibular intact bilaterally. X - Palate elevates symmetrically, poor denture. XI - Chin turning & shoulder shrug intact bilaterally. XII - Tongue protrusion intact.  Motor Strength - The patient's strength testing showed lack of effect and left arm pain and back pain, RUE 5/5, RLE 4/5, LUE 3/5 with pain at shoulder, LLE 3/5 with back pain. Bulk was normal and fasciculations were absent.   Motor Tone - Muscle tone was assessed at the neck and appendages and was normal.  Reflexes - The patient's reflexes were 1+ in all extremities and she had no pathological reflexes.  Sensory - Light touch, temperature/pinprick were assessed and were symmetrical on BUE, but subjectively decreased on the LLE.     Coordination - not cooperative on exam.  Tremor was absent.  Gait and Station - not tested.   ASSESSMENT/PLAN Ms. Teresa Wiley is a 60 y.o. female with history of COPD, pulmonary hypertension, CHF, diabetes mellitus  presenting with left sided weakness. She did not receive IV t-PA due to delay in arrival.   Likely conversion disorder  CTA head and neck Unremarkable  CT perfusion no acute ischemia  MRI  Unremarkable   2D Echo  pending   LDL 84  HgbA1c pending  Lovenox 40 mg sq daily for VTE  prophylaxis DIET - DYS 1 Room service appropriate?: Yes; Fluid consistency:: Thin  No antithrombotic listed prior to admission, now on aspirin 325 mg daily. Continue ASA on discharge.  Patient counseled to be compliant with her antithrombotic medications  Ongoing aggressive stroke risk factor management  Therapy recommendations:  pending   Disposition:  Pending. Anticipate return home.   Hypertension  Stable  Hyperlipidemia  Home meds:  lipitor 10, resumed in hospital  LDL 84, goal < 70  Continue statin at discharge  Diabetes  HgbA1c pending, goal < 7.0  Other  Stroke Risk Factors   former Cigarette smoker,   Obesity, Body mass index is 36.36 kg/(m^2).   Other Active Problems  Acute renal failure  Syncope secondary to hypertension  History of CHF and pulmonary hypertension  History of COPD  Anemia   Hospital day # 1  Neurology will sign off. Please call with questions. No neuro follow up indicated at this time. Thanks for the consult.  Rosalin Hawking, MD PhD Stroke Neurology 01/11/2016 8:41 PM    To contact Stroke Continuity provider, please refer to [email or web address] After hours, contact General Neurology

## 2016-01-11 NOTE — Evaluation (Signed)
Physical Therapy Evaluation Patient Details Name: Teresa Wiley MRN: [id number] DOB: 25-Jun-1956 Today's Date: 01/11/2016   History of Present Illness  60 y.o. female with history of chronic back pain (recently discharged from [facility] pain management clinic due to too many missed appointments--per pt); morbid obesity, COPD, pulmonary hypertension, CHF, diabetes mellitus was brought to the ER after patient had a syncopal episode following which patient required brief CPR and was found to have left-sided weakness. Head CT, CT angiogram and MRI negative for acute findings.     Clinical Impression  Pt presented with above symptoms and all neurological testing thus far is negative for acute event. Pt very inconsistent with her LUE and LLE abilities throughout session. Although unable to lift or bend leg in supine, she stood x 2 minutes and performed mini-squats with RW.  (See below for further specifics re: mobility and LLE testing). Pt reported and was very pleased that she had been given "narcotics" for her pain today--stating she had run out at home since discharged from the [facility] pain clinic.   Pt currently presents with functional limitations due to the deficits listed below (see PT Problem List). Discussed with patient potential need for further residential rehab prior to discharge home, and at first was not interested, however then stated she would consider. (Noted pt is now observation status, therefore CIR consult is not possible). Anticipate she will make great progress due to the inconsistencies of her deficits. Pt may benefit from skilled PT to increase their independence and safety with mobility to allow discharge to the venue listed below.       Follow Up Recommendations SNF     Equipment Recommendations  Rolling walker with 5" wheels    Recommendations for Other Services       Precautions / Restrictions Precautions Precautions: Fall Precaution Comments: pt reports frequent falls at home  due to leg weakness (although only outside, never falls inside)      Mobility  Bed Mobility Overal bed mobility: Needs Assistance Bed Mobility: Rolling;Sidelying to Sit;Sit to Supine Rolling: Min assist Sidelying to sit: Min assist   Sit to supine: Min assist   General bed mobility comments: all assist given for moving LLE; pt's leg muscles more active at times than others (inconsistent)  Transfers Overall transfer level: Needs assistance Equipment used: Rolling walker (2 wheeled) (bedrail) Transfers: Sit to/from Stand Sit to Stand: Min guard         General transfer comment: using bedrail and mattress to push off, she came to stand over bil legs with no buckling (even when transitioned hands from bed to RW)  Ambulation/Gait             General Gait Details: not tested due to pt's reports of bil leg fatigue after 3 squats at EOB  Stairs            Wheelchair Mobility    Modified Rankin (Stroke Patients Only)       Balance Overall balance assessment: History of Falls;Needs assistance Sitting-balance support: No upper extremity supported;Feet unsupported Sitting balance-Leahy Scale: Good     Standing balance support: Bilateral upper extremity supported Standing balance-Leahy Scale: Poor                               Pertinent Vitals/Pain Pain Assessment: Faces Faces Pain Scale: Hurts whole lot Pain Location: Lt hip, back and posterior thigh Pain Descriptors / Indicators: Grimacing Pain Intervention(s): Limited activity  within patient's tolerance;Monitored during session;Repositioned    Home Living Family/patient expects to be discharged to:: Private residence Living Arrangements: Spouse/significant other;Children (wife; teenage son) Available Help at Discharge: Family Type of Home: Other(Comment) (townhouse) Home Access: Stairs to enter Entrance Stairs-Rails: Right;Left ("concrete walls") Entrance Stairs-Number of Steps: 3 Home  Layout: Two level;Bed/bath upstairs;Other (Comment) (son uses downstairs bedroom and bathroom) Home Equipment: None      Prior Function Level of Independence: Needs assistance   Gait / Transfers Assistance Needed: stayed upstairs mostly (due to progressive weakness and heart "racing"); if goes downstairs or outside it is only with 2 men helping her; denies falls inside home, falls "everytime" when goes outside (that's why she only goes with men assisting her)  ADL's / Homemaking Assistance Needed: washes up at sink mostly (got rid of shower seat because she didn't like the holes in the seat and gave her skin "sores" everywhere there was a drain hole        Hand Dominance        Extremity/Trunk Assessment   Upper Extremity Assessment: Defer to OT evaluation (noted to move LUE for functional tasks; much weaker on comma)           Lower Extremity Assessment: LLE deficits/detail   LLE Deficits / Details: very inconsistent results; when asked to lift leg off bed, there is much apparent effort, however no increased pressure through RLE to stabilize; unable to even bring knee to chest in supine, yet able to stand without buckling x 2 minutes and perform mini-squats  Cervical / Trunk Assessment: Other exceptions  Communication   Communication: No difficulties  Cognition Arousal/Alertness: Awake/alert Behavior During Therapy: WFL for tasks assessed/performed Overall Cognitive Status: Within Functional Limits for tasks assessed (verbose and not sure her timeline makes sense)                      General Comments General comments (skin integrity, edema, etc.): Daughter present; another woman entered during session ?wife    Exercises Other Exercises Other Exercises: AAROM LLE prior to sit EOB      Assessment/Plan    PT Assessment Patient needs continued PT services  PT Diagnosis Hemiplegia non-dominant side   PT Problem List Decreased strength;Decreased activity  tolerance;Decreased mobility;Decreased balance;Decreased knowledge of use of DME;Impaired sensation;Obesity  PT Treatment Interventions DME instruction;Gait training;Stair training;Functional mobility training;Therapeutic activities;Patient/family education   PT Goals (Current goals can be found in the Care Plan section) Acute Rehab PT Goals Patient Stated Goal: diminish back pain PT Goal Formulation: With patient Time For Goal Achievement: 01/18/16 Potential to Achieve Goals: Good    Frequency Min 3X/week   Barriers to discharge        Co-evaluation               End of Session Equipment Utilized During Treatment: Gait belt Activity Tolerance: Patient tolerated treatment well Patient left: in bed;with call bell/phone within reach;with family/visitor present;Other (comment) (echo personnel entering room ) Nurse Communication: Mobility status;Other (comment) (inconsitent weakness/abilities; recommend 2 person assist)    Functional Assessment Tool Used: clinical judgement Functional Limitation: Mobility: Walking and moving around Mobility: Walking and Moving Around Current Status [phone]): At least 20 percent but less than 40 percent impaired, limited or restricted Mobility: Walking and Moving Around Goal Status [phone]): At least 1 percent but less than 20 percent impaired, limited or restricted    Time: 1439-1515 PT Time Calculation (min) (ACUTE ONLY): 36 min   Charges:  PT Evaluation $PT Eval High Complexity: 1 Procedure PT Treatments $Therapeutic Activity: 8-22 mins   PT G Codes:   PT G-Codes **NOT FOR INPATIENT CLASS** Functional Assessment Tool Used: clinical judgement Functional Limitation: Mobility: Walking and moving around Mobility: Walking and Moving Around Current Status [id number]): At least 20 percent but less than 40 percent impaired, limited or restricted Mobility: Walking and Moving Around Goal Status [phone]): At least 1 percent but less than 20 percent  impaired, limited or restricted    Raniah Karan 01/11/2016, 4:40 PM Pager [phone]

## 2016-01-11 NOTE — Progress Notes (Signed)
Patient arrived from ED around 0100 she is alert and oriented but is very difficult to understand left side of face is drooping and she says her tongue feels fat and is spastic, left side ataxia and weakness and right side is not very strong. Has chronic low back pain, family is at bedside BP is running soft, will continue to monitor.
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
## 2016-01-11 NOTE — ED Notes (Signed)
Attempted to call report

## 2016-01-11 NOTE — Care Management Note (Signed)
Case Management Note  Patient Details  Name: Teresa Wiley MRN: [id number] Date of Birth: 09-Aug-1956  Subjective/Objective:                    Action/Plan: Patient presented with L-sided weakness.  Will follow for discharge needs pending PT/OT evals and physician orders.  Expected Discharge Date:   (Pending)               Expected Discharge Plan:     In-House Referral:     Discharge planning Services     Post Acute Care Choice:    Choice offered to:     DME Arranged:    DME Agency:     HH Arranged:    HH Agency:     Status of Service:  In process, will continue to follow  Medicare Important Message Given:    Date Medicare IM Given:    Medicare IM give by:    Date Additional Medicare IM Given:    Additional Medicare Important Message give by:     If discussed at [place] of Stay Meetings, dates discussed:    Additional Comments:  Rolm Baptise, RN 01/11/2016, 10:59 AM [phone]

## 2016-01-11 NOTE — Progress Notes (Signed)
Patient is not being compliant with diet i came into room and patient is eating chips and popcorn, she is being deceptive about her physical condition as her physical symptoms are not consistent with each assessment, the only thing that is consistent with this patient are her request for pain medication and xanax.

## 2016-01-11 NOTE — Progress Notes (Signed)
PT Cancellation Note  Patient Details Name: Teresa Wiley MRN: [id number] DOB: March 20, 1956   Cancelled Treatment:    Reason Eval/Treat Not Completed: Patient at procedure or test/unavailable. Working with Speech Therapy   Eyla Tallon 01/11/2016, 10:28 AM Pager [phone]

## 2016-01-12 LAB — GLUCOSE, CAPILLARY
GLUCOSE-CAPILLARY: 135 mg/dL — AB (ref 65–99)
Glucose-Capillary: 122 mg/dL — ABNORMAL HIGH (ref 65–99)

## 2016-01-12 LAB — HEMOGLOBIN A1C
HEMOGLOBIN A1C: 6.6 % — AB (ref 4.8–5.6)
Mean Plasma Glucose: 143 mg/dL

## 2016-01-12 MED ORDER — METHOCARBAMOL 500 MG PO TABS
500.0000 mg | ORAL_TABLET | Freq: Four times a day (QID) | ORAL | Status: DC | PRN
  Administered 2016-01-12: 500 mg via ORAL
  Filled 2016-01-12: qty 1

## 2016-01-12 MED ORDER — GABAPENTIN 300 MG PO CAPS: 300.0000 mg | ORAL_CAPSULE | Freq: Three times a day (TID) | ORAL | Status: DC

## 2016-01-12 MED ORDER — METHOCARBAMOL 500 MG PO TABS: 500.0000 mg | ORAL_TABLET | Freq: Four times a day (QID) | ORAL | Status: DC | PRN

## 2016-01-12 NOTE — Care Management Note (Signed)
Case Management Note  Patient Details  Name: Evanjelina Ockerman MRN: [id number] Date of Birth: 12-20-1955  Subjective/Objective:                    Action/Plan: Patient discharging home today with self care. Pt with orders for rolling walker and tub bench. Jermaine with [facility] notified and will deliver the DME to the room. Patient not happy with her current PCP. Patient with Medicaid and insisting she can change MD's. CM set her up with [facility]. Appointment placed on the AVS. Information pamphlet given to patient. Bedside RN updated.   Expected Discharge Date:   (Pending)               Expected Discharge Plan:  Home/Self Care  In-House Referral:     Discharge planning Services  CM Consult  Post Acute Care Choice:    Choice offered to:  Patient  DME Arranged:  Tub bench, Walker rolling DME Agency:  [place]:    [place]:     Status of Service:  Completed, signed off  Medicare Important Message Given:    Date Medicare IM Given:    Medicare IM give by:    Date Additional Medicare IM Given:    Additional Medicare Important Message give by:     If discussed at [place] of Stay Meetings, dates discussed:    Additional Comments:  Pollie Friar, RN 01/12/2016, 2:28 PM

## 2016-01-12 NOTE — Progress Notes (Signed)
Physical Therapy Treatment Patient Details Name: Teresa Wiley MRN: [id number] DOB: Oct 11, 1956 Today's Date: 01/12/2016    History of Present Illness 60 y.o. female with history of chronic back pain (recently discharged from [facility] pain management clinic due to too many missed appointments--per pt); morbid obesity, COPD, pulmonary hypertension, CHF, diabetes mellitus was brought to the ER after patient had a syncopal episode following which patient required brief CPR and was found to have left-sided weakness. Head CT, CT angiogram and MRI negative for acute findings.     PT Comments    Patient smiling, laughing with nursing and family on arrival. No deficits noted moving legs off EOB, during sit to stand, and able to stand without UE support. See below re: changes in gait over course of 20 feet. Patient reported heart "racing" and noted HR had elevated from 111 to 118 bpm while walking. Pt reported feeling clammy and returned to room to sit on EOB. Upon sitting to rest, HR increased to 124 bpm. Patient reported she was told by MD after 09/2014 heart catheterization that her HR should never go above 99 bpm because she "has a bad heart." For clarification, pulled up report from this study which stated it was a normal heart catheterization. Pulled up 2D echo report from 01/11/16 showing normal EF (55-60%). Reported results to patient and her daughters. Asked patient if she has been diagnosed with anxiety disorder and she stated "yes." Stated she has not been able to get her anxiety medicine since she lost her primary doctor at [facility].   Educated patient that from a PT perspective there was no reason she could not begin to gradually increase her activity (walking and ADLs) to achieve her goals of being able to move throughout her house (including steps). Pt inquiring re: HHPT and explained that she could pay out of pocket but her insurance would not cover the cost. Patient reported she cannot afford to do that.  Discussed that there was no reason (from a PT perspective) that she could not go home today with family's assist as they have been doing PTA.  Patient became frustrated with information I had provided and asked to speak to her doctor (Dr. Wyline Copas) so she could get some questions answered. Spoke with Dr. Wyline Copas re: patient's request.    Follow Up Recommendations  No PT follow up     Equipment Recommendations  Rolling walker with 5" wheels    Recommendations for Other Services       Precautions / Restrictions Precautions Precautions: Fall Precaution Comments: pt reports frequent falls at home due to leg weakness (although only outside, never falls inside) Restrictions Weight Bearing Restrictions: No    Mobility  Bed Mobility Overal bed mobility: Modified Independent Bed Mobility: Supine to Sit     Supine to sit: Modified independent (Device/Increase time)     General bed mobility comments: no difficulty moving LLE off bed (did not use UEs to assist); refused to lie down, remained seated EOB with family and bed alarm on  Transfers Overall transfer level: Needs assistance Equipment used: Rolling walker (2 wheeled) (bedrail)   Sit to Stand: Min guard         General transfer comment: using bedrail and mattress to push off, she came to stand over bil legs with no buckling (even when transitioned hands from bed to RW); close guard due to h/o falls/syncope  Ambulation/Gait Ambulation/Gait assistance: Min guard Ambulation Distance (Feet): 20 Feet Assistive device: Rolling walker (2 wheeled) Gait Pattern/deviations: Step-through  pattern;Step-to pattern;Trunk flexed   Gait velocity interpretation: Below normal speed for age/gender General Gait Details: initial 10 ft pt walking step-through however stepping too close to RW with feet going past the front wheels despite repeated instructions; began to feel "clammy" and heart racing (HR went from 111 to 118) and began to walk back to  bed. Pt began dragging LLE behind her and hopping on RLE; at one point she leaned forwards on her elbows on Rw to rest   Stairs Stairs:  (discussed that son's will assist her up (as they usually do))          Wheelchair Mobility    Modified Rankin (Stroke Patients Only)       Balance     Sitting balance-Leahy Scale: Good     Standing balance support: No upper extremity supported Standing balance-Leahy Scale: Fair                      Cognition Arousal/Alertness: Awake/alert Behavior During Therapy: Anxious;Impulsive Overall Cognitive Status: Within Functional Limits for tasks assessed                      Exercises      General Comments        Pertinent Vitals/Pain Pain Assessment: Faces Faces Pain Scale: Hurts a little bit Pain Location: headache Pain Descriptors / Indicators: Headache Pain Intervention(s): Limited activity within patient's tolerance;Monitored during session    Home Living                      Prior Function            PT Goals (current goals can now be found in the care plan section) Acute Rehab PT Goals Patient Stated Goal: be able to walk through house without passing out Time For Goal Achievement: 01/18/16 Progress towards PT goals: Progressing toward goals    Frequency  Min 3X/week    PT Plan Discharge plan needs to be updated    Co-evaluation             End of Session Equipment Utilized During Treatment: Gait belt Activity Tolerance: Patient limited by fatigue;Other (comment) (self-limited) Patient left: in bed;with call bell/phone within reach;with family/visitor present;with bed alarm set     Time: 1100-1147 PT Time Calculation (min) (ACUTE ONLY): 47 min  Charges:  $Gait Training: 23-37 mins $Self Care/Home Management: 8-22                    G Codes:      Teresa Wiley 01/13/16, 12:05 PM Pager [phone]

## 2016-01-12 NOTE — Progress Notes (Signed)
Speech Language Pathology Treatment: Dysphagia  Patient Details Name: Teresa Wiley MRN: [id number] DOB: 1956-10-31 Today's Date: 01/12/2016 Time: 3254-9826 SLP Time Calculation (min) (ACUTE ONLY): 29 min  Assessment / Plan / Recommendation Clinical Impression  Pt does not have any oral deficits noted today, with no oral holding observed. Despite lack of dentition, pt consumed regular textures and thin liquids via straw without any difficulty or signs of aspiration. Recommend regular textures and thin liquids. SLP to sign off for swallowing.   Of note, pt states that she does not have a PCP, and she and her wife would like help obtaining one.   HPI HPI: 60 y.o. female with history of COPD, pulmonary hypertension, CHF, diabetes mellitus was brought to the ER after patient had a syncopal episode following which patient required brief CPR and was found to have left-sided weakness. MRI negative for acute findings.      SLP Plan  All goals met     Recommendations  Diet recommendations: Regular;Thin liquid Liquids provided via: Cup;Straw Medication Administration: Whole meds with liquid Supervision: Patient able to self feed Compensations: Slow rate;Small sips/bites Postural Changes and/or Swallow Maneuvers: Seated upright 90 degrees             Oral Care Recommendations: Oral care BID Follow up Recommendations: None Plan: All goals met     GO               Germain Osgood, M.A. CCC-SLP [phone]   Germain Osgood 01/12/2016, 2:26 PM

## 2016-01-12 NOTE — Progress Notes (Signed)
SLP Late Entry:   01/11/16 1000  SLP G-Codes **NOT FOR INPATIENT CLASS**  Functional Assessment Tool Used skilled clinical judgment  Functional Limitations Swallowing  Swallow Current Status [id number]) CK  Swallow Goal Status [id number]) CJ  SLP Evaluations  $ SLP Speech Visit 1 Procedure  SLP Evaluations  $BSS Swallow 1 Procedure  $Swallowing Treatment 1 Procedure   Germain Osgood, M.A. CCC-SLP [phone]

## 2016-01-12 NOTE — Progress Notes (Signed)
Patient questioned the change from q4 to q8 in her pain medication and I told her that the provider discovered that she was released from a pain clinic for questionable reasons and that there were conflicting results with assessment since she had been admitted all of which made the attending provider fell that a reduction in the amount of pain medication would be in her best interest considering all of her heart and other comorbidities.
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
## 2016-01-19 ENCOUNTER — Inpatient Hospital Stay: Payer: Self-pay

## 2016-11-22 ENCOUNTER — Emergency Department (HOSPITAL_COMMUNITY): Payer: Medicaid Other

## 2016-11-22 ENCOUNTER — Emergency Department (HOSPITAL_COMMUNITY)
Admission: EM | Admit: 2016-11-22 | Discharge: 2016-11-23 | Disposition: A | Discharge status: Home / Self Care | Payer: Medicaid Other | Attending: Emergency Medicine | Admitting: Emergency Medicine

## 2016-11-22 ENCOUNTER — Encounter (HOSPITAL_COMMUNITY): Payer: Self-pay | Admitting: Neurology

## 2016-11-22 DIAGNOSIS — Z8673 Personal history of transient ischemic attack (TIA), and cerebral infarction without residual deficits: Secondary | ICD-10-CM | POA: Diagnosis not present

## 2016-11-22 DIAGNOSIS — J45909 Unspecified asthma, uncomplicated: Secondary | ICD-10-CM | POA: Diagnosis not present

## 2016-11-22 DIAGNOSIS — R079 Chest pain, unspecified: Secondary | ICD-10-CM

## 2016-11-22 DIAGNOSIS — E119 Type 2 diabetes mellitus without complications: Secondary | ICD-10-CM | POA: Insufficient documentation

## 2016-11-22 DIAGNOSIS — Z87891 Personal history of nicotine dependence: Secondary | ICD-10-CM | POA: Insufficient documentation

## 2016-11-22 DIAGNOSIS — Z79899 Other long term (current) drug therapy: Secondary | ICD-10-CM | POA: Diagnosis not present

## 2016-11-22 DIAGNOSIS — Z7984 Long term (current) use of oral hypoglycemic drugs: Secondary | ICD-10-CM | POA: Insufficient documentation

## 2016-11-22 LAB — I-STAT TROPONIN, ED
TROPONIN I, POC: 0 ng/mL (ref 0.00–0.08)
Troponin i, poc: 0 ng/mL (ref 0.00–0.08)

## 2016-11-22 LAB — CBC
HCT: 35.9 % — ABNORMAL LOW (ref 36.0–46.0)
Hemoglobin: 10.8 g/dL — ABNORMAL LOW (ref 12.0–15.0)
MCH: 23.3 pg — AB (ref 26.0–34.0)
MCHC: 30.1 g/dL (ref 30.0–36.0)
MCV: 77.4 fL — AB (ref 78.0–100.0)
PLATELETS: 515 10*3/uL — AB (ref 150–400)
RBC: 4.64 MIL/uL (ref 3.87–5.11)
RDW: 16.3 % — AB (ref 11.5–15.5)
WBC: 7.1 10*3/uL (ref 4.0–10.5)

## 2016-11-22 LAB — BASIC METABOLIC PANEL
Anion gap: 10 (ref 5–15)
BUN: 9 mg/dL (ref 6–20)
CALCIUM: 9.3 mg/dL (ref 8.9–10.3)
CHLORIDE: 102 mmol/L (ref 101–111)
CO2: 26 mmol/L (ref 22–32)
CREATININE: 0.83 mg/dL (ref 0.44–1.00)
GFR calc non Af Amer: >60 mL/min (ref >60)
GLUCOSE: 102 mg/dL — AB (ref 65–99)
Potassium: 4 mmol/L (ref 3.5–5.1)
Sodium: 138 mmol/L (ref 135–145)

## 2016-11-22 LAB — CBG MONITORING, ED: Glucose-Capillary: 100 mg/dL — ABNORMAL HIGH (ref 65–99)

## 2016-11-22 MED ORDER — OXYCODONE-ACETAMINOPHEN 5-325 MG PO TABS
2.0000 | ORAL_TABLET | Freq: Once | ORAL | Status: AC
  Administered 2016-11-22: 2 via ORAL
  Filled 2016-11-22: qty 2

## 2016-11-22 MED ORDER — LORAZEPAM 1 MG PO TABS
1.0000 mg | ORAL_TABLET | Freq: Once | ORAL | Status: AC
  Administered 2016-11-22: 1 mg via ORAL
  Filled 2016-11-22: qty 1

## 2016-11-22 NOTE — ED Triage Notes (Addendum)
Pt reports cp and sob since last night. Feels like burning to left side of her chest. No radiation, denies n/v/d. CP 7/10. Has not been taking he ativan and thinks that may be making her nervous.
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
## 2017-01-18 ENCOUNTER — Emergency Department (HOSPITAL_COMMUNITY): Payer: Medicaid Other

## 2017-01-18 ENCOUNTER — Emergency Department (HOSPITAL_COMMUNITY)
Admission: EM | Admit: 2017-01-18 | Discharge: 2017-01-18 | Disposition: A | Discharge status: Home / Self Care | Payer: Medicaid Other | Attending: Emergency Medicine | Admitting: Emergency Medicine

## 2017-01-18 ENCOUNTER — Encounter (HOSPITAL_COMMUNITY): Payer: Self-pay | Admitting: Emergency Medicine

## 2017-01-18 DIAGNOSIS — I10 Essential (primary) hypertension: Secondary | ICD-10-CM | POA: Insufficient documentation

## 2017-01-18 DIAGNOSIS — J40 Bronchitis, not specified as acute or chronic: Secondary | ICD-10-CM

## 2017-01-18 DIAGNOSIS — E119 Type 2 diabetes mellitus without complications: Secondary | ICD-10-CM | POA: Diagnosis not present

## 2017-01-18 DIAGNOSIS — R0602 Shortness of breath: Secondary | ICD-10-CM | POA: Diagnosis not present

## 2017-01-18 DIAGNOSIS — Z7984 Long term (current) use of oral hypoglycemic drugs: Secondary | ICD-10-CM | POA: Diagnosis not present

## 2017-01-18 DIAGNOSIS — Z87891 Personal history of nicotine dependence: Secondary | ICD-10-CM | POA: Diagnosis not present

## 2017-01-18 DIAGNOSIS — Z8673 Personal history of transient ischemic attack (TIA), and cerebral infarction without residual deficits: Secondary | ICD-10-CM | POA: Insufficient documentation

## 2017-01-18 DIAGNOSIS — R05 Cough: Secondary | ICD-10-CM | POA: Diagnosis present

## 2017-01-18 LAB — BASIC METABOLIC PANEL
Anion gap: 9 (ref 5–15)
BUN: 12 mg/dL (ref 6–20)
CHLORIDE: 99 mmol/L — AB (ref 101–111)
CO2: 30 mmol/L (ref 22–32)
CREATININE: 1.03 mg/dL — AB (ref 0.44–1.00)
Calcium: 8.6 mg/dL — ABNORMAL LOW (ref 8.9–10.3)
GFR calc Af Amer: >60 mL/min (ref >60)
GFR calc non Af Amer: 58 mL/min — ABNORMAL LOW (ref >60)
GLUCOSE: 103 mg/dL — AB (ref 65–99)
Potassium: 5.9 mmol/L — ABNORMAL HIGH (ref 3.5–5.1)
SODIUM: 138 mmol/L (ref 135–145)

## 2017-01-18 LAB — I-STAT CHEM 8, ED
BUN: 13 mg/dL (ref 6–20)
CALCIUM ION: 1.06 mmol/L — AB (ref 1.15–1.40)
CHLORIDE: 99 mmol/L — AB (ref 101–111)
Creatinine, Ser: 1 mg/dL (ref 0.44–1.00)
GLUCOSE: 93 mg/dL (ref 65–99)
HCT: 32 % — ABNORMAL LOW (ref 36.0–46.0)
Hemoglobin: 10.9 g/dL — ABNORMAL LOW (ref 12.0–15.0)
POTASSIUM: 5.2 mmol/L — AB (ref 3.5–5.1)
Sodium: 139 mmol/L (ref 135–145)
TCO2: 34 mmol/L (ref 0–100)

## 2017-01-18 LAB — I-STAT TROPONIN, ED: Troponin i, poc: 0 ng/mL (ref 0.00–0.08)

## 2017-01-18 LAB — CBC
HCT: 31.3 % — ABNORMAL LOW (ref 36.0–46.0)
Hemoglobin: 8.9 g/dL — ABNORMAL LOW (ref 12.0–15.0)
MCH: 23.2 pg — ABNORMAL LOW (ref 26.0–34.0)
MCHC: 28.4 g/dL — AB (ref 30.0–36.0)
MCV: 81.5 fL (ref 78.0–100.0)
PLATELETS: 367 10*3/uL (ref 150–400)
RBC: 3.84 MIL/uL — ABNORMAL LOW (ref 3.87–5.11)
RDW: 16.3 % — AB (ref 11.5–15.5)
WBC: 10.7 10*3/uL — AB (ref 4.0–10.5)

## 2017-01-18 LAB — BRAIN NATRIURETIC PEPTIDE: B NATRIURETIC PEPTIDE 5: 51.1 pg/mL (ref 0.0–100.0)

## 2017-01-18 LAB — INFLUENZA PANEL BY PCR (TYPE A & B)
Influenza A By PCR: NEGATIVE
Influenza B By PCR: NEGATIVE

## 2017-01-18 MED ORDER — BENZONATATE 100 MG PO CAPS: 100.0000 mg | ORAL_CAPSULE | Freq: Three times a day (TID) | ORAL | 0 refills | Status: DC | PRN

## 2017-01-18 MED ORDER — ACETAMINOPHEN 325 MG PO TABS
650.0000 mg | ORAL_TABLET | Freq: Once | ORAL | Status: AC
  Administered 2017-01-18: 650 mg via ORAL
  Filled 2017-01-18: qty 2

## 2017-01-18 MED ORDER — OXYCODONE-ACETAMINOPHEN 5-325 MG PO TABS
1.0000 | ORAL_TABLET | Freq: Once | ORAL | Status: AC
  Administered 2017-01-18: 1 via ORAL
  Filled 2017-01-18: qty 1

## 2017-01-18 MED ORDER — IPRATROPIUM-ALBUTEROL 0.5-2.5 (3) MG/3ML IN SOLN
3.0000 mL | Freq: Once | RESPIRATORY_TRACT | Status: DC
  Filled 2017-01-18: qty 3

## 2017-01-18 MED ORDER — SODIUM CHLORIDE 0.9 % IV BOLUS (SEPSIS)
1000.0000 mL | Freq: Once | INTRAVENOUS | Status: AC
  Administered 2017-01-18: 1000 mL via INTRAVENOUS

## 2017-01-18 MED ORDER — PREDNISONE 20 MG PO TABS
40.0000 mg | ORAL_TABLET | Freq: Once | ORAL | Status: DC
  Filled 2017-01-18: qty 2

## 2017-01-18 NOTE — Discharge Instructions (Signed)
Teresa Wiley,  Please follow up with your primary care doctor this coming week.  Please return if your symptoms do not improve or worsen.

## 2017-01-18 NOTE — ED Triage Notes (Signed)
Visitor reports that it appeared pt was getting sick for past 2 days. Last night pt began having chills and required extra blankets. Today pt has been lethargic and sleeping. When visitor tried to wake pt up, pt reports that she is waiting for her parents to come get her. Pt then would go back to sleep. Pt drowsy in triage, pulse ox 80% on room air. Pulse ox increased to 91% on 3 L O2.

## 2017-01-18 NOTE — ED Notes (Signed)
[facility] EMT called to advise unable to run I Stat chem 8 with blood drawn.  Needs to be re-drawn.

## 2017-01-18 NOTE — ED Notes (Signed)
Patient transported to X-ray 

## 2017-01-18 NOTE — ED Notes (Signed)
Tried to run Chem 8 X 2. Both times stars for each result. Blood remixed each time. Not enough blood in tube to run a 3rd time. Called RN Angela Nevin to let her know needs to be redrawn.

## 2017-01-18 NOTE — ED Notes (Signed)
Family and patient updated on plan of care.  MD will reassess patient, patient is A&Ox4 at this time and in no current distress.
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
## 2017-04-28 ENCOUNTER — Other Ambulatory Visit: Payer: Self-pay | Admitting: Emergency Medicine

## 2017-06-13 ENCOUNTER — Emergency Department (HOSPITAL_BASED_OUTPATIENT_CLINIC_OR_DEPARTMENT_OTHER)
Admission: EM | Admit: 2017-06-13 | Discharge: 2017-06-13 | Disposition: A | Discharge status: Home / Self Care | Payer: Medicaid Other | Attending: Emergency Medicine | Admitting: Emergency Medicine

## 2017-06-13 ENCOUNTER — Encounter (HOSPITAL_BASED_OUTPATIENT_CLINIC_OR_DEPARTMENT_OTHER): Payer: Self-pay

## 2017-06-13 DIAGNOSIS — Z23 Encounter for immunization: Secondary | ICD-10-CM | POA: Insufficient documentation

## 2017-06-13 DIAGNOSIS — Z79899 Other long term (current) drug therapy: Secondary | ICD-10-CM | POA: Diagnosis not present

## 2017-06-13 DIAGNOSIS — R2242 Localized swelling, mass and lump, left lower limb: Secondary | ICD-10-CM | POA: Diagnosis present

## 2017-06-13 DIAGNOSIS — L03116 Cellulitis of left lower limb: Secondary | ICD-10-CM

## 2017-06-13 DIAGNOSIS — J45909 Unspecified asthma, uncomplicated: Secondary | ICD-10-CM | POA: Diagnosis not present

## 2017-06-13 DIAGNOSIS — Z87891 Personal history of nicotine dependence: Secondary | ICD-10-CM | POA: Diagnosis not present

## 2017-06-13 DIAGNOSIS — E119 Type 2 diabetes mellitus without complications: Secondary | ICD-10-CM | POA: Diagnosis not present

## 2017-06-13 DIAGNOSIS — I10 Essential (primary) hypertension: Secondary | ICD-10-CM | POA: Diagnosis not present

## 2017-06-13 DIAGNOSIS — Z7984 Long term (current) use of oral hypoglycemic drugs: Secondary | ICD-10-CM | POA: Insufficient documentation

## 2017-06-13 MED ORDER — VANCOMYCIN HCL IN DEXTROSE 1-5 GM/200ML-% IV SOLN: 1000.0000 mg | Freq: Once | INTRAVENOUS | Status: DC

## 2017-06-13 MED ORDER — LEVOFLOXACIN 750 MG PO TABS
750.0000 mg | ORAL_TABLET | Freq: Once | ORAL | Status: AC
  Administered 2017-06-13: 750 mg via ORAL
  Filled 2017-06-13: qty 1

## 2017-06-13 MED ORDER — LIDOCAINE HCL (PF) 1 % IJ SOLN: 20.0000 mL | Freq: Once | INTRAMUSCULAR | Status: DC

## 2017-06-13 MED ORDER — LIDOCAINE HCL 1 % IJ SOLN
INTRAMUSCULAR | Status: AC
  Administered 2017-06-13: 20 mL
  Filled 2017-06-13: qty 20

## 2017-06-13 MED ORDER — LEVOFLOXACIN 500 MG PO TABS: 750.0000 mg | ORAL_TABLET | Freq: Every day | ORAL | 0 refills | Status: DC

## 2017-06-13 MED ORDER — TETANUS-DIPHTH-ACELL PERTUSSIS 5-2.5-18.5 LF-MCG/0.5 IM SUSP
0.5000 mL | Freq: Once | INTRAMUSCULAR | Status: AC
  Administered 2017-06-13: 0.5 mL via INTRAMUSCULAR

## 2017-06-13 MED ORDER — TETANUS-DIPHTH-ACELL PERTUSSIS 5-2.5-18.5 LF-MCG/0.5 IM SUSP
INTRAMUSCULAR | Status: DC
Start: 2017-06-13 — End: 2017-06-13
  Filled 2017-06-13: qty 0.5

## 2017-06-13 MED ORDER — FENTANYL CITRATE (PF) 100 MCG/2ML IJ SOLN
100.0000 ug | Freq: Once | INTRAMUSCULAR | Status: AC
  Administered 2017-06-13: 100 ug via INTRAMUSCULAR
  Filled 2017-06-13: qty 2

## 2017-06-13 MED ORDER — LIDOCAINE HCL (PF) 1 % IJ SOLN
30.0000 mL | Freq: Once | INTRAMUSCULAR | Status: DC
  Filled 2017-06-13: qty 30
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
## 2017-06-13 NOTE — ED Notes (Signed)
ED Provider at bedside. 

## 2017-06-13 NOTE — ED Notes (Signed)
Pt verbalizes understanding of d/c instructions and denies any further needs at this time. 

## 2017-06-13 NOTE — ED Triage Notes (Signed)
Pt felt something bite her foot about a week ago, it started to turn into an abscess about 3 days ago and she and her family tried to drain it at home but it has gotten worse with swelling, pain and redness to entire foot now.  Pt denies fever, states she is taking her percocet but it is not helping, is also trying some left over penicillin.  Clear fluid currently draining from wound, no streaking appreciated up the leg but the lower leg is slightly warm

## 2017-06-13 NOTE — Discharge Instructions (Signed)
Please take all of your antibiotics until finished!   You may develop abdominal discomfort or diarrhea from the antibiotic.  You may help offset this with probiotics which you can buy or get in yogurt. Do not eat  or take the probiotics until 2 hours after your antibiotic.   Please return in 24 hours for re-check.

## 2017-07-08 ENCOUNTER — Other Ambulatory Visit: Payer: Self-pay | Admitting: Emergency Medicine

## 2017-08-07 ENCOUNTER — Other Ambulatory Visit: Payer: Self-pay | Admitting: Emergency Medicine

## 2017-08-13 ENCOUNTER — Other Ambulatory Visit: Payer: Self-pay | Admitting: Emergency Medicine

## 2018-01-31 ENCOUNTER — Other Ambulatory Visit: Payer: Self-pay

## 2018-01-31 ENCOUNTER — Inpatient Hospital Stay (HOSPITAL_COMMUNITY)
Admission: EM | Admit: 2018-01-31 | Discharge: 2018-02-01 | DRG: 189 | Discharge status: Against Medical Advice | Payer: Medicaid Other | Attending: Internal Medicine | Admitting: Internal Medicine

## 2018-01-31 ENCOUNTER — Emergency Department (HOSPITAL_COMMUNITY): Payer: Medicaid Other

## 2018-01-31 DIAGNOSIS — E119 Type 2 diabetes mellitus without complications: Secondary | ICD-10-CM

## 2018-01-31 DIAGNOSIS — J9621 Acute and chronic respiratory failure with hypoxia: Principal | ICD-10-CM

## 2018-01-31 DIAGNOSIS — J984 Other disorders of lung: Secondary | ICD-10-CM | POA: Diagnosis not present

## 2018-01-31 DIAGNOSIS — Z91048 Other nonmedicinal substance allergy status: Secondary | ICD-10-CM

## 2018-01-31 DIAGNOSIS — M549 Dorsalgia, unspecified: Secondary | ICD-10-CM | POA: Diagnosis present

## 2018-01-31 DIAGNOSIS — I110 Hypertensive heart disease with heart failure: Secondary | ICD-10-CM

## 2018-01-31 DIAGNOSIS — E66812 Obesity, class 2: Secondary | ICD-10-CM | POA: Diagnosis present

## 2018-01-31 DIAGNOSIS — Z91041 Radiographic dye allergy status: Secondary | ICD-10-CM

## 2018-01-31 DIAGNOSIS — Z6841 Body Mass Index (BMI) 40.0 and over, adult: Secondary | ICD-10-CM | POA: Diagnosis not present

## 2018-01-31 DIAGNOSIS — I2721 Secondary pulmonary arterial hypertension: Secondary | ICD-10-CM

## 2018-01-31 DIAGNOSIS — Z7901 Long term (current) use of anticoagulants: Secondary | ICD-10-CM

## 2018-01-31 DIAGNOSIS — Z7984 Long term (current) use of oral hypoglycemic drugs: Secondary | ICD-10-CM | POA: Diagnosis not present

## 2018-01-31 DIAGNOSIS — R4182 Altered mental status, unspecified: Secondary | ICD-10-CM | POA: Diagnosis present

## 2018-01-31 DIAGNOSIS — R402413 Glasgow coma scale score 13-15, at hospital admission: Secondary | ICD-10-CM | POA: Diagnosis present

## 2018-01-31 DIAGNOSIS — I5032 Chronic diastolic (congestive) heart failure: Secondary | ICD-10-CM | POA: Diagnosis present

## 2018-01-31 DIAGNOSIS — N179 Acute kidney failure, unspecified: Secondary | ICD-10-CM | POA: Diagnosis present

## 2018-01-31 DIAGNOSIS — Z881 Allergy status to other antibiotic agents status: Secondary | ICD-10-CM

## 2018-01-31 DIAGNOSIS — Z885 Allergy status to narcotic agent status: Secondary | ICD-10-CM

## 2018-01-31 DIAGNOSIS — K219 Gastro-esophageal reflux disease without esophagitis: Secondary | ICD-10-CM | POA: Diagnosis present

## 2018-01-31 DIAGNOSIS — J449 Chronic obstructive pulmonary disease, unspecified: Secondary | ICD-10-CM

## 2018-01-31 DIAGNOSIS — Z79899 Other long term (current) drug therapy: Secondary | ICD-10-CM

## 2018-01-31 DIAGNOSIS — J9601 Acute respiratory failure with hypoxia: Secondary | ICD-10-CM | POA: Diagnosis present

## 2018-01-31 DIAGNOSIS — Z91013 Allergy to seafood: Secondary | ICD-10-CM

## 2018-01-31 DIAGNOSIS — G8929 Other chronic pain: Secondary | ICD-10-CM | POA: Diagnosis present

## 2018-01-31 DIAGNOSIS — Z79891 Long term (current) use of opiate analgesic: Secondary | ICD-10-CM

## 2018-01-31 DIAGNOSIS — Z5321 Procedure and treatment not carried out due to patient leaving prior to being seen by health care provider: Secondary | ICD-10-CM | POA: Diagnosis not present

## 2018-01-31 DIAGNOSIS — I272 Pulmonary hypertension, unspecified: Secondary | ICD-10-CM | POA: Diagnosis present

## 2018-01-31 DIAGNOSIS — F419 Anxiety disorder, unspecified: Secondary | ICD-10-CM

## 2018-01-31 DIAGNOSIS — Z9981 Dependence on supplemental oxygen: Secondary | ICD-10-CM

## 2018-01-31 DIAGNOSIS — R0602 Shortness of breath: Secondary | ICD-10-CM

## 2018-01-31 DIAGNOSIS — R404 Transient alteration of awareness: Secondary | ICD-10-CM

## 2018-01-31 DIAGNOSIS — Z87891 Personal history of nicotine dependence: Secondary | ICD-10-CM | POA: Diagnosis not present

## 2018-01-31 DIAGNOSIS — I10 Essential (primary) hypertension: Secondary | ICD-10-CM | POA: Diagnosis present

## 2018-01-31 DIAGNOSIS — Z6835 Body mass index (BMI) 35.0-35.9, adult: Secondary | ICD-10-CM | POA: Diagnosis present

## 2018-01-31 DIAGNOSIS — E669 Obesity, unspecified: Secondary | ICD-10-CM | POA: Diagnosis present

## 2018-01-31 DIAGNOSIS — M5136 Other intervertebral disc degeneration, lumbar region: Secondary | ICD-10-CM | POA: Diagnosis present

## 2018-01-31 DIAGNOSIS — T50901A Poisoning by unspecified drugs, medicaments and biological substances, accidental (unintentional), initial encounter: Secondary | ICD-10-CM | POA: Diagnosis present

## 2018-01-31 DIAGNOSIS — G4736 Sleep related hypoventilation in conditions classified elsewhere: Secondary | ICD-10-CM

## 2018-01-31 LAB — COMPREHENSIVE METABOLIC PANEL
ALT: 15 U/L (ref 14–54)
AST: 29 U/L (ref 15–41)
Albumin: 3.6 g/dL (ref 3.5–5.0)
Alkaline Phosphatase: 122 U/L (ref 38–126)
Anion gap: 11 (ref 5–15)
BUN: 13 mg/dL (ref 6–20)
CO2: 29 mmol/L (ref 22–32)
Calcium: 8.7 mg/dL — ABNORMAL LOW (ref 8.9–10.3)
Chloride: 98 mmol/L — ABNORMAL LOW (ref 101–111)
Creatinine, Ser: 1.35 mg/dL — ABNORMAL HIGH (ref 0.44–1.00)
GFR calc Af Amer: 48 mL/min — ABNORMAL LOW (ref >60)
GFR calc non Af Amer: 42 mL/min — ABNORMAL LOW (ref >60)
Glucose, Bld: 156 mg/dL — ABNORMAL HIGH (ref 65–99)
Potassium: 5.7 mmol/L — ABNORMAL HIGH (ref 3.5–5.1)
Sodium: 138 mmol/L (ref 135–145)
Total Bilirubin: 0.7 mg/dL (ref 0.3–1.2)
Total Protein: 7.7 g/dL (ref 6.5–8.1)

## 2018-01-31 LAB — CBC WITH DIFFERENTIAL/PLATELET
Basophils Absolute: 0 10*3/uL (ref 0.0–0.1)
Basophils Relative: 0 %
Eosinophils Absolute: 0 10*3/uL (ref 0.0–0.7)
Eosinophils Relative: 0 %
HCT: 38.1 % (ref 36.0–46.0)
Hemoglobin: 10.8 g/dL — ABNORMAL LOW (ref 12.0–15.0)
Lymphocytes Relative: 19 %
Lymphs Abs: 2.2 10*3/uL (ref 0.7–4.0)
MCH: 24.2 pg — ABNORMAL LOW (ref 26.0–34.0)
MCHC: 28.3 g/dL — ABNORMAL LOW (ref 30.0–36.0)
MCV: 85.2 fL (ref 78.0–100.0)
Monocytes Absolute: 0.7 10*3/uL (ref 0.1–1.0)
Monocytes Relative: 6 %
Neutro Abs: 8.6 10*3/uL — ABNORMAL HIGH (ref 1.7–7.7)
Neutrophils Relative %: 75 %
Platelets: 343 10*3/uL (ref 150–400)
RBC: 4.47 MIL/uL (ref 3.87–5.11)
RDW: 16.8 % — ABNORMAL HIGH (ref 11.5–15.5)
WBC: 11.6 10*3/uL — ABNORMAL HIGH (ref 4.0–10.5)

## 2018-01-31 LAB — I-STAT ARTERIAL BLOOD GAS, ED
ACID-BASE EXCESS: 7 mmol/L — AB (ref 0.0–2.0)
Bicarbonate: 32.8 mmol/L — ABNORMAL HIGH (ref 20.0–28.0)
O2 SAT: 89 %
PH ART: 7.403 (ref 7.350–7.450)
TCO2: 34 mmol/L — ABNORMAL HIGH (ref 22–32)
pCO2 arterial: 52.9 mmHg — ABNORMAL HIGH (ref 32.0–48.0)
pO2, Arterial: 60 mmHg — ABNORMAL LOW (ref 83.0–108.0)

## 2018-01-31 LAB — HEMOGLOBIN A1C
HEMOGLOBIN A1C: 7.2 % — AB (ref 4.8–5.6)
Mean Plasma Glucose: 159.94 mg/dL

## 2018-01-31 LAB — ETHANOL: Alcohol, Ethyl (B): <10 mg/dL (ref <10)

## 2018-01-31 LAB — AMMONIA: Ammonia: 21 umol/L (ref 9–35)

## 2018-01-31 LAB — D-DIMER, QUANTITATIVE (NOT AT ARMC): D-Dimer, Quant: 0.28 ug/mL-FEU (ref 0.00–0.50)

## 2018-01-31 MED ORDER — MAGNESIUM SULFATE 2 GM/50ML IV SOLN
2.0000 g | Freq: Once | INTRAVENOUS | Status: AC
  Administered 2018-01-31: 2 g via INTRAVENOUS
  Filled 2018-01-31: qty 50

## 2018-01-31 MED ORDER — ACETAMINOPHEN 650 MG RE SUPP: 650.0000 mg | Freq: Four times a day (QID) | RECTAL | Status: DC | PRN

## 2018-01-31 MED ORDER — MOMETASONE FURO-FORMOTEROL FUM 100-5 MCG/ACT IN AERO: 2.0000 | INHALATION_SPRAY | Freq: Two times a day (BID) | RESPIRATORY_TRACT | Status: DC

## 2018-01-31 MED ORDER — ACETAMINOPHEN 325 MG PO TABS
650.0000 mg | ORAL_TABLET | Freq: Four times a day (QID) | ORAL | Status: DC | PRN
  Administered 2018-01-31: 650 mg via ORAL
  Filled 2018-01-31: qty 2

## 2018-01-31 MED ORDER — SODIUM CHLORIDE 0.9% FLUSH: 3.0000 mL | Freq: Two times a day (BID) | INTRAVENOUS | Status: DC

## 2018-01-31 MED ORDER — ENOXAPARIN SODIUM 40 MG/0.4ML ~~LOC~~ SOLN
40.0000 mg | SUBCUTANEOUS | Status: DC
  Administered 2018-01-31: 40 mg via SUBCUTANEOUS
  Filled 2018-01-31: qty 0.4

## 2018-01-31 MED ORDER — INSULIN ASPART 100 UNIT/ML ~~LOC~~ SOLN: 0.0000 [IU] | Freq: Three times a day (TID) | SUBCUTANEOUS | Status: DC

## 2018-01-31 MED ORDER — IPRATROPIUM-ALBUTEROL 0.5-2.5 (3) MG/3ML IN SOLN
3.0000 mL | Freq: Four times a day (QID) | RESPIRATORY_TRACT | Status: DC
  Administered 2018-01-31: 3 mL via RESPIRATORY_TRACT
  Filled 2018-01-31: qty 3

## 2018-01-31 MED ORDER — ALBUTEROL SULFATE (2.5 MG/3ML) 0.083% IN NEBU: 2.5000 mg | INHALATION_SOLUTION | RESPIRATORY_TRACT | Status: DC | PRN

## 2018-01-31 MED ORDER — METHYLPREDNISOLONE SODIUM SUCC 125 MG IJ SOLR
125.0000 mg | Freq: Once | INTRAMUSCULAR | Status: AC
  Administered 2018-01-31: 125 mg via INTRAVENOUS
  Filled 2018-01-31: qty 2

## 2018-01-31 MED ORDER — IPRATROPIUM-ALBUTEROL 0.5-2.5 (3) MG/3ML IN SOLN
3.0000 mL | Freq: Once | RESPIRATORY_TRACT | Status: AC
  Administered 2018-01-31: 3 mL via RESPIRATORY_TRACT
  Filled 2018-01-31: qty 3
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
## 2018-01-31 NOTE — ED Provider Notes (Signed)
Medical screening examination/treatment/procedure(s) were conducted as a shared visit with non-physician practitioner(s) and myself.  I personally evaluated the patient during the encounter.   EKG Interpretation  Date/Time:  Saturday January 31 2018 11:14:21 EST Ventricular Rate:  99 PR Interval:    QRS Duration: 93 QT Interval:  337 QTC Calculation: 433 R Axis:   75 Text Interpretation:  Sinus rhythm Low voltage, precordial leads Borderline T abnormalities, anterior leads no change from previous Confirmed by Charlesetta Shanks [phone] on 01/31/2018 3:22:22 PM     Patient has complex medical problems.  Asthma, chronic pain, pulmonary hypertension, morbid obesity, diabetes.  Patient is here with her live-in companion.  She reports that the patient has been sleeping more than typical over the past couple of days.  She reports that the patient has been noncompliant with her oxygen for over a week.  It has been over the past couple days with increasing sleep but no other particularly noticeable symptoms.  Today however she reports that she was so somnolent that she could not awaken her even by risk movements and stimulus that would typically create pain such as moving her in such a way that would precipitate her back pain.  At this point EMS was called.  The patient's girlfriend reports that they takes her medications together usually.  She does report that she is giving the patient Percocet and Ativan is prescribed for her chronic back pain.  Patient was much more somnolent upon arrival.  She still remains somnolent but now awakens to light to moderate stimulus to converse with me.  Patient describes to me her main symptom is pain in her legs and lower back.  The patient reports she has been trying to exercise more because she needs to have weight loss.  The patient reports she cannot remember all the things that happened to get her to the emergency department and eventually started to wake up when she had all  the EMS personnel around her.  Patient is sleeping.  She has somewhat sonorous respirations.  She awakens to light moderate stimulus.  Once awake she is conversing with me in a oriented fashion.  Heart is regular no gross rub murmur gallop.  Lungs grossly clear with soft breath sounds.  Abdomen nontender.  Lower extremities no significant peripheral edema or calf tenderness.  She does have morbid obesity.  At this time, highest on my differential is for possible hypercapnia with hypoventilation syndrome exacerbated by use of pain medications and benzodiazepines.  Patient does seem to be showing signs of improvement.  At this time no sign of impending airway failure.  She remains somnolent but with stimulus awakens to oriented mental status.  Patient will require admission for ongoing observation and management.   Charlesetta Shanks, MD 01/31/18 1630

## 2018-01-31 NOTE — ED Notes (Signed)
Pt is A&O x 4 at this time.  Asked pt if it was possible that she took additional pain medication today, pt responded, "It's possible."
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
## 2018-01-31 NOTE — ED Triage Notes (Signed)
Per EMS Pt found on floor in bed room beside bed and dresser. Family called EMS to report Pt was DEAD.  Pt lethargic but does open eyes to painful . Pt alert to name only . Per EMS Pt  Takes Percocet and Baclofen for chronic back pain. Pt's  Family reports  Hearing Pt up walking at 0400.

## 2018-01-31 NOTE — ED Notes (Signed)
Pt admitted to wife in the presence of this writer that she did not wait for her to administer pain medication.

## 2018-01-31 NOTE — ED Triage Notes (Signed)
Multiple request for  Only 2 visitors in room at a time. There are 5 visitors in room now.

## 2018-01-31 NOTE — ED Triage Notes (Signed)
Unable to obtain IV acces  With [place]  Multiple attempts made.

## 2018-01-31 NOTE — ED Triage Notes (Signed)
Please call Pt Wife - Ruby at [phone]

## 2018-02-01 NOTE — Discharge Summary (Addendum)
Name: Tenleigh Byer MRN: [id number] DOB: 09/10/1956 62 y.o. PCP: Ellender Hose, MD  Date of Admission: 01/31/2018 11:07 AM Date of Discharge: 02/01/2018 Attending Physician: Joni Reining, DO    Name: Tully Burgo MRN: [id number] DOB: Aug 10, 1956 62 y.o. PCP: Ellender Hose, MD  Date of Admission: 01/31/2018 11:07 AM Date of Discharge: 02/01/2018 Attending Physician: No att. providers found  Discharge Diagnosis: 1. Acute on Chronic Respiratory failure 2. Chronic pain  Principal Problem:   Acute respiratory failure with hypoxia (HCC) Active Problems:   Obesity, morbid, BMI 40.0-49.9 (HCC)   Nocturnal hypoxemia due to obesity   HTN (hypertension)   Diabetes type 2, controlled ([place])   Pulmonary HTN ([place])   Discharge Medications: Allergies as of 02/01/2018      Reactions   Erythromycin Anaphylaxis, Nausea And Vomiting   Morphine And Related Hives   Pt immediately c/o itching and hives after administration of 4mg  Morphine IV, required Benadryl IV for relief.   Shellfish Allergy Anaphylaxis, Hives   Iodine Hives, Rash   Patient reports not having anaphylaxis to iodine. That was entered in mistake previously.      Medication List    ASK your doctor about these medications   albuterol 108 (90 Base) MCG/ACT inhaler Commonly known as:  PROVENTIL HFA;VENTOLIN HFA Inhale 2 puffs into the lungs every 6 (six) hours as needed for wheezing or shortness of breath. For shortness of breath   amLODipine 5 MG tablet Commonly known as:  NORVASC Take 5 mg by mouth daily.   baclofen 10 MG tablet Commonly known as:  LIORESAL Take 10 mg by mouth 3 (three) times daily.   benzonatate 100 MG capsule Commonly known as:  TESSALON PERLES Take 1 capsule (100 mg total) by mouth 3 (three) times daily as needed for cough.   carvedilol 6.25 MG tablet Commonly known as:  COREG Take 6.25 mg by mouth 2 (two) times daily with a meal.   dexlansoprazole 60 MG capsule Commonly known as:   DEXILANT Take 60 mg by mouth daily.   ELIQUIS 5 MG Tabs tablet Generic drug:  apixaban Take 5 mg by mouth 2 (two) times daily.   gabapentin 400 MG capsule Commonly known as:  NEURONTIN Take 400 mg by mouth 3 (three) times daily.   gabapentin 300 MG capsule Commonly known as:  NEURONTIN Take 1 capsule (300 mg total) by mouth 3 (three) times daily.   linaclotide 290 MCG Caps capsule Commonly known as:  LINZESS Take 290 mcg by mouth daily before breakfast.   lisinopril 5 MG tablet Commonly known as:  PRINIVIL,ZESTRIL Take 1 tablet (5 mg total) by mouth daily.   loperamide 2 MG capsule Commonly known as:  IMODIUM Take 1 capsule (2 mg total) by mouth 4 (four) times daily as needed for diarrhea or loose stools.   LORazepam 1 MG tablet Commonly known as:  ATIVAN Take 1 tablet (1 mg total) by mouth 2 (two) times daily.   lubiprostone 24 MCG capsule Commonly known as:  AMITIZA Take 24 mcg by mouth 2 (two) times daily as needed for constipation.   metFORMIN 500 MG tablet Commonly known as:  GLUCOPHAGE Take 500 mg by mouth 2 (two) times daily with a meal.   methocarbamol 500 MG tablet Commonly known as:  ROBAXIN Take 1 tablet (500 mg total) by mouth every 6 (six) hours as needed for muscle spasms.   mometasone-formoterol 100-5 MCG/ACT Aero Commonly known as:  DULERA Inhale 2 puffs into the lungs 2 (two) times  daily.   oxyCODONE-acetaminophen 10-325 MG tablet Commonly known as:  PERCOCET Take 1 tablet by mouth 4 (four) times daily.   OXYGEN Inhale 2-3 L into the lungs as needed (shortness of breath).   tiotropium 18 MCG inhalation capsule Commonly known as:  SPIRIVA Place 1 capsule (18 mcg total) into inhaler and inhale daily.   Vitamin D (Ergocalciferol) 50000 units Caps capsule Commonly known as:  DRISDOL Take 50,000 Units by mouth every Saturday.       Disposition and follow-up:   Ms.Fedra Messler was discharged from [facility] in Stable  condition.  At the hospital follow up visit please address:  1.    Acute on Chronic Respiratory Failure with Hypoxia -Patient has not been using her 3 L supplemental oxygen for one week duration prior to presentation -No signs/symptoms of pneumonia or COPD exacerbation -Would likely benefit from pulmonary follow up -PFTs to establish formal diagnosis   Chronic Pain  -Patient's somnolence was likely due to overuse of sedating medications, as she eventually admitted to nursing staff that she may have taken additional doses than prescribed -Please address the amount of sedating medications the patient is taking -Would likely benefit from new pain contract   2.  Labs / imaging needed at time of follow-up: none  3.  Pending labs/ test needing follow-up: none  Follow-up Appointments:   Hospital Course by problem list: Principal Problem:   Acute respiratory failure with hypoxia ([place]) Active Problems:   Obesity, morbid, BMI 40.0-49.9 (HCC)   Nocturnal hypoxemia due to obesity   HTN (hypertension)   Diabetes type 2, controlled (HCC)   Pulmonary HTN (HCC)   Acute on Chronic Respiratory Failure with Hypoxia Chronic Pain  Received report from night team that patient became more alert and responsive in the emergency department yesterday evening, and left against medical advice. The IMTS night team was unable to see the patient because she had left by the time they were notified.   In brief summary, 62 yo female with PMH of morbid obesity, HTN, COPD, DMII, and chronic pain on several sedating medications presenting from home with increased somnolence and altered mental status. She was arousable to name and light touch when evaluated by admitting team at approximately 5 pm, but unable to participate in the evaluation. She was requiring greater than her chronic 3 liters of supplemental oxygen and was intermittently desaturating into 70-80s. CXR negative for focal opacity, edema, or effusions. ABG  was obtained and showed pH 7.403, pCO2, 52.9, pO2 60.0. She was placed on BiPAP. Per chart review she became more alert and oriented around 730 PM and admitted to her wife that she did not wait for her to administer her pain medications and it is possible that she may have taken more than prescribed. The patient chose to leave AMA from [facility], verbalizing understanding of risks associated with leaving.   Discharge Vitals:   BP 138/74   Pulse 100   Resp (!) 21   SpO2 94%   Pertinent Labs, Studies, and Procedures:  CBC Latest Ref Rng & Units 01/31/2018 01/18/2017 01/18/2017  WBC 4.0 - 10.5 K/uL 11.6(H) - 10.7(H)  Hemoglobin 12.0 - 15.0 g/dL 10.8(L) 10.9(L) 8.9(L)  Hematocrit 36.0 - 46.0 % 38.1 32.0(L) 31.3(L)  Platelets 150 - 400 K/uL 343 - 367   BMP Latest Ref Rng & Units 01/31/2018 01/18/2017 01/18/2017  Glucose 65 - 99 mg/dL 156(H) 93 103(H)  BUN 6 - 20 mg/dL 13 13 12   Creatinine 0.44 -  1.00 mg/dL 1.35(H) 1.00 1.03(H)  Sodium 135 - 145 mmol/L 138 139 138  Potassium 3.5 - 5.1 mmol/L 5.7(H) 5.2(H) 5.9(H)  Chloride 101 - 111 mmol/L 98(L) 99(L) 99(L)  CO2 22 - 32 mmol/L 29 - 30  Calcium 8.9 - 10.3 mg/dL 8.7(L) - 8.6(L)   Chest Xray FINDINGS: Cardiac silhouette is normal in size. No mediastinal or hilar masses. Prominent pulmonary arteries are stable from the most recent prior study.  Mild reticular scarring noted in the anterior lung bases best seen on the lateral view, corresponding to the right middle lobe and left upper lobe lingula. This is stable. There is no evidence of pneumonia or pulmonary edema. No pleural effusion or pneumothorax.  Skeletal structures are grossly intact.  IMPRESSION: No acute cardiopulmonary disease.  Discharge Instructions:   Signed: Melanee Spry, MD 02/01/2018, 2:04 PM   Pager: [phone]

## 2018-02-01 NOTE — ED Notes (Signed)
Pt ambulated to the bathroom w/ a steady gait.  Pt's O2 sat is 96% on RA.

## 2018-02-01 NOTE — ED Notes (Signed)
Pt is choosing to leave AMA.  Risks associated up to and including death have been explained to pt.  Pt verbalized understanding and accepts risks associated w/ leaving AMA.

## 2018-03-24 ENCOUNTER — Encounter (HOSPITAL_BASED_OUTPATIENT_CLINIC_OR_DEPARTMENT_OTHER): Payer: Self-pay | Admitting: *Deleted

## 2018-03-24 ENCOUNTER — Other Ambulatory Visit: Payer: Self-pay

## 2018-03-24 ENCOUNTER — Emergency Department (HOSPITAL_BASED_OUTPATIENT_CLINIC_OR_DEPARTMENT_OTHER): Payer: Medicaid Other

## 2018-03-24 ENCOUNTER — Emergency Department (HOSPITAL_BASED_OUTPATIENT_CLINIC_OR_DEPARTMENT_OTHER)
Admission: EM | Admit: 2018-03-24 | Discharge: 2018-03-24 | Disposition: A | Discharge status: Home / Self Care | Payer: Medicaid Other | Attending: Emergency Medicine | Admitting: Emergency Medicine

## 2018-03-24 DIAGNOSIS — Z7901 Long term (current) use of anticoagulants: Secondary | ICD-10-CM | POA: Insufficient documentation

## 2018-03-24 DIAGNOSIS — R0602 Shortness of breath: Secondary | ICD-10-CM | POA: Insufficient documentation

## 2018-03-24 DIAGNOSIS — F419 Anxiety disorder, unspecified: Secondary | ICD-10-CM | POA: Diagnosis not present

## 2018-03-24 DIAGNOSIS — I10 Essential (primary) hypertension: Secondary | ICD-10-CM | POA: Insufficient documentation

## 2018-03-24 DIAGNOSIS — I272 Pulmonary hypertension, unspecified: Secondary | ICD-10-CM | POA: Diagnosis not present

## 2018-03-24 DIAGNOSIS — Z9981 Dependence on supplemental oxygen: Secondary | ICD-10-CM

## 2018-03-24 DIAGNOSIS — Z7984 Long term (current) use of oral hypoglycemic drugs: Secondary | ICD-10-CM | POA: Insufficient documentation

## 2018-03-24 DIAGNOSIS — J45909 Unspecified asthma, uncomplicated: Secondary | ICD-10-CM | POA: Diagnosis not present

## 2018-03-24 DIAGNOSIS — R0789 Other chest pain: Secondary | ICD-10-CM

## 2018-03-24 DIAGNOSIS — Z9049 Acquired absence of other specified parts of digestive tract: Secondary | ICD-10-CM | POA: Insufficient documentation

## 2018-03-24 DIAGNOSIS — Z87891 Personal history of nicotine dependence: Secondary | ICD-10-CM | POA: Insufficient documentation

## 2018-03-24 DIAGNOSIS — J9611 Chronic respiratory failure with hypoxia: Secondary | ICD-10-CM | POA: Insufficient documentation

## 2018-03-24 DIAGNOSIS — E119 Type 2 diabetes mellitus without complications: Secondary | ICD-10-CM | POA: Insufficient documentation

## 2018-03-24 DIAGNOSIS — Z79899 Other long term (current) drug therapy: Secondary | ICD-10-CM | POA: Diagnosis not present

## 2018-03-24 DIAGNOSIS — Z8673 Personal history of transient ischemic attack (TIA), and cerebral infarction without residual deficits: Secondary | ICD-10-CM | POA: Diagnosis not present

## 2018-03-24 DIAGNOSIS — R2243 Localized swelling, mass and lump, lower limb, bilateral: Secondary | ICD-10-CM | POA: Diagnosis present

## 2018-03-24 LAB — CBC WITH DIFFERENTIAL/PLATELET
Basophils Absolute: 0 10*3/uL (ref 0.0–0.1)
Basophils Relative: 0 %
EOS ABS: 0.2 10*3/uL (ref 0.0–0.7)
EOS PCT: 4 %
HCT: 35.3 % — ABNORMAL LOW (ref 36.0–46.0)
Hemoglobin: 10.6 g/dL — ABNORMAL LOW (ref 12.0–15.0)
LYMPHS ABS: 1.6 10*3/uL (ref 0.7–4.0)
Lymphocytes Relative: 33 %
MCH: 23.6 pg — ABNORMAL LOW (ref 26.0–34.0)
MCHC: 30 g/dL (ref 30.0–36.0)
MCV: 78.6 fL (ref 78.0–100.0)
MONO ABS: 0.5 10*3/uL (ref 0.1–1.0)
MONOS PCT: 11 %
Neutro Abs: 2.5 10*3/uL (ref 1.7–7.7)
Neutrophils Relative %: 52 %
PLATELETS: 317 10*3/uL (ref 150–400)
RBC: 4.49 MIL/uL (ref 3.87–5.11)
RDW: 16.8 % — AB (ref 11.5–15.5)
WBC: 4.8 10*3/uL (ref 4.0–10.5)

## 2018-03-24 LAB — COMPREHENSIVE METABOLIC PANEL
ALK PHOS: 105 U/L (ref 38–126)
ALT: 12 U/L — ABNORMAL LOW (ref 14–54)
ANION GAP: 10 (ref 5–15)
AST: 15 U/L (ref 15–41)
Albumin: 3.8 g/dL (ref 3.5–5.0)
BUN: 10 mg/dL (ref 6–20)
CALCIUM: 8.8 mg/dL — AB (ref 8.9–10.3)
CHLORIDE: 99 mmol/L — AB (ref 101–111)
CO2: 26 mmol/L (ref 22–32)
Creatinine, Ser: 0.58 mg/dL (ref 0.44–1.00)
GFR calc Af Amer: >60 mL/min (ref >60)
GFR calc non Af Amer: >60 mL/min (ref >60)
Glucose, Bld: 248 mg/dL — ABNORMAL HIGH (ref 65–99)
POTASSIUM: 3.9 mmol/L (ref 3.5–5.1)
SODIUM: 135 mmol/L (ref 135–145)
Total Bilirubin: 0.7 mg/dL (ref 0.3–1.2)
Total Protein: 7.5 g/dL (ref 6.5–8.1)

## 2018-03-24 LAB — BRAIN NATRIURETIC PEPTIDE: B NATRIURETIC PEPTIDE 5: 29 pg/mL (ref 0.0–100.0)

## 2018-03-24 LAB — TROPONIN I: Troponin I: <0.03 ng/mL (ref <0.03)

## 2018-03-24 MED ORDER — OXYCODONE-ACETAMINOPHEN 5-325 MG PO TABS
1.0000 | ORAL_TABLET | Freq: Once | ORAL | Status: AC
  Administered 2018-03-24: 1 via ORAL
  Filled 2018-03-24: qty 1

## 2018-03-24 MED ORDER — IOPAMIDOL (ISOVUE-370) INJECTION 76%: 100.0000 mL | Freq: Once | INTRAVENOUS | Status: DC | PRN

## 2018-03-24 MED ORDER — CARVEDILOL 6.25 MG PO TABS
6.2500 mg | ORAL_TABLET | Freq: Two times a day (BID) | ORAL | Status: DC
  Filled 2018-03-24: qty 1

## 2018-03-24 MED ORDER — CYCLOBENZAPRINE HCL 10 MG PO TABS
10.0000 mg | ORAL_TABLET | Freq: Once | ORAL | Status: AC
  Administered 2018-03-24: 10 mg via ORAL
  Filled 2018-03-24: qty 1

## 2018-03-24 MED ORDER — NITROGLYCERIN 0.4 MG SL SUBL
0.4000 mg | SUBLINGUAL_TABLET | SUBLINGUAL | Status: DC | PRN
  Administered 2018-03-24 (×2): 0.4 mg via SUBLINGUAL
  Filled 2018-03-24: qty 1

## 2018-03-24 NOTE — ED Triage Notes (Signed)
Pt c/o Bil leg swelling and SOB  X 2 days

## 2018-03-24 NOTE — ED Notes (Signed)
Patient transported to X-ray 
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
## 2018-07-31 LAB — PULMONARY FUNCTION TEST

## 2018-08-26 ENCOUNTER — Encounter: Payer: Self-pay | Admitting: Internal Medicine

## 2018-11-17 ENCOUNTER — Observation Stay (HOSPITAL_COMMUNITY)
Admission: EM | Admit: 2018-11-17 | Discharge: 2018-11-18 | Disposition: A | Discharge status: Home / Self Care | Payer: Medicaid Other | Attending: Internal Medicine | Admitting: Internal Medicine

## 2018-11-17 ENCOUNTER — Other Ambulatory Visit: Payer: Self-pay

## 2018-11-17 ENCOUNTER — Encounter (HOSPITAL_COMMUNITY): Payer: Self-pay

## 2018-11-17 ENCOUNTER — Emergency Department (HOSPITAL_COMMUNITY): Payer: Medicaid Other

## 2018-11-17 ENCOUNTER — Observation Stay (HOSPITAL_COMMUNITY): Payer: Medicaid Other

## 2018-11-17 DIAGNOSIS — Z7901 Long term (current) use of anticoagulants: Secondary | ICD-10-CM | POA: Insufficient documentation

## 2018-11-17 DIAGNOSIS — I959 Hypotension, unspecified: Secondary | ICD-10-CM

## 2018-11-17 DIAGNOSIS — R197 Diarrhea, unspecified: Secondary | ICD-10-CM | POA: Diagnosis present

## 2018-11-17 DIAGNOSIS — I10 Essential (primary) hypertension: Secondary | ICD-10-CM

## 2018-11-17 DIAGNOSIS — Z7984 Long term (current) use of oral hypoglycemic drugs: Secondary | ICD-10-CM | POA: Insufficient documentation

## 2018-11-17 DIAGNOSIS — G894 Chronic pain syndrome: Secondary | ICD-10-CM

## 2018-11-17 DIAGNOSIS — Z79891 Long term (current) use of opiate analgesic: Secondary | ICD-10-CM

## 2018-11-17 DIAGNOSIS — N179 Acute kidney failure, unspecified: Secondary | ICD-10-CM | POA: Diagnosis not present

## 2018-11-17 DIAGNOSIS — Z888 Allergy status to other drugs, medicaments and biological substances status: Secondary | ICD-10-CM | POA: Diagnosis not present

## 2018-11-17 DIAGNOSIS — R55 Syncope and collapse: Principal | ICD-10-CM | POA: Diagnosis present

## 2018-11-17 DIAGNOSIS — K5909 Other constipation: Secondary | ICD-10-CM | POA: Insufficient documentation

## 2018-11-17 DIAGNOSIS — J189 Pneumonia, unspecified organism: Secondary | ICD-10-CM

## 2018-11-17 DIAGNOSIS — Z91048 Other nonmedicinal substance allergy status: Secondary | ICD-10-CM

## 2018-11-17 DIAGNOSIS — I272 Pulmonary hypertension, unspecified: Secondary | ICD-10-CM

## 2018-11-17 DIAGNOSIS — Z885 Allergy status to narcotic agent status: Secondary | ICD-10-CM | POA: Diagnosis not present

## 2018-11-17 DIAGNOSIS — K5900 Constipation, unspecified: Secondary | ICD-10-CM

## 2018-11-17 DIAGNOSIS — J9621 Acute and chronic respiratory failure with hypoxia: Secondary | ICD-10-CM | POA: Diagnosis not present

## 2018-11-17 DIAGNOSIS — Z79899 Other long term (current) drug therapy: Secondary | ICD-10-CM | POA: Diagnosis not present

## 2018-11-17 DIAGNOSIS — Z720 Tobacco use: Secondary | ICD-10-CM

## 2018-11-17 DIAGNOSIS — Z881 Allergy status to other antibiotic agents status: Secondary | ICD-10-CM | POA: Diagnosis not present

## 2018-11-17 DIAGNOSIS — D751 Secondary polycythemia: Secondary | ICD-10-CM

## 2018-11-17 DIAGNOSIS — E119 Type 2 diabetes mellitus without complications: Secondary | ICD-10-CM | POA: Insufficient documentation

## 2018-11-17 DIAGNOSIS — M47816 Spondylosis without myelopathy or radiculopathy, lumbar region: Secondary | ICD-10-CM

## 2018-11-17 DIAGNOSIS — F419 Anxiety disorder, unspecified: Secondary | ICD-10-CM

## 2018-11-17 DIAGNOSIS — Z91013 Allergy to seafood: Secondary | ICD-10-CM

## 2018-11-17 DIAGNOSIS — J9601 Acute respiratory failure with hypoxia: Secondary | ICD-10-CM | POA: Diagnosis not present

## 2018-11-17 DIAGNOSIS — J181 Lobar pneumonia, unspecified organism: Secondary | ICD-10-CM

## 2018-11-17 DIAGNOSIS — R918 Other nonspecific abnormal finding of lung field: Secondary | ICD-10-CM

## 2018-11-17 DIAGNOSIS — Z8719 Personal history of other diseases of the digestive system: Secondary | ICD-10-CM

## 2018-11-17 DIAGNOSIS — Z9119 Patient's noncompliance with other medical treatment and regimen: Secondary | ICD-10-CM

## 2018-11-17 DIAGNOSIS — R109 Unspecified abdominal pain: Secondary | ICD-10-CM

## 2018-11-17 LAB — CBC WITH DIFFERENTIAL/PLATELET
Abs Immature Granulocytes: 0 10*3/uL (ref 0.00–0.07)
Basophils Absolute: 0.1 10*3/uL (ref 0.0–0.1)
Basophils Relative: 1 %
Eosinophils Absolute: 0.3 10*3/uL (ref 0.0–0.5)
Eosinophils Relative: 3 %
HCT: 47.8 % — ABNORMAL HIGH (ref 36.0–46.0)
Hemoglobin: 13.7 g/dL (ref 12.0–15.0)
Lymphocytes Relative: 17 %
Lymphs Abs: 1.7 10*3/uL (ref 0.7–4.0)
MCH: 23.1 pg — ABNORMAL LOW (ref 26.0–34.0)
MCHC: 28.7 g/dL — ABNORMAL LOW (ref 30.0–36.0)
MCV: 80.5 fL (ref 80.0–100.0)
Monocytes Absolute: 0.3 10*3/uL (ref 0.1–1.0)
Monocytes Relative: 3 %
NEUTROS PCT: 76 %
Neutro Abs: 7.6 10*3/uL (ref 1.7–7.7)
Platelets: 440 10*3/uL — ABNORMAL HIGH (ref 150–400)
RBC: 5.94 MIL/uL — AB (ref 3.87–5.11)
RDW: 22 % — ABNORMAL HIGH (ref 11.5–15.5)
WBC: 10 10*3/uL (ref 4.0–10.5)
nRBC: 0 % (ref 0.0–0.2)
nRBC: 0 /100 WBC

## 2018-11-17 LAB — COMPREHENSIVE METABOLIC PANEL
ALT: 12 U/L (ref 0–44)
ANION GAP: 12 (ref 5–15)
AST: 24 U/L (ref 15–41)
Albumin: 3.9 g/dL (ref 3.5–5.0)
Alkaline Phosphatase: 87 U/L (ref 38–126)
BUN: 11 mg/dL (ref 8–23)
CHLORIDE: 99 mmol/L (ref 98–111)
CO2: 27 mmol/L (ref 22–32)
Calcium: 9.5 mg/dL (ref 8.9–10.3)
Creatinine, Ser: 1.23 mg/dL — ABNORMAL HIGH (ref 0.44–1.00)
GFR calc Af Amer: 55 mL/min — ABNORMAL LOW (ref >60)
GFR calc non Af Amer: 47 mL/min — ABNORMAL LOW (ref >60)
Glucose, Bld: 127 mg/dL — ABNORMAL HIGH (ref 70–99)
POTASSIUM: 4 mmol/L (ref 3.5–5.1)
Sodium: 138 mmol/L (ref 135–145)
Total Bilirubin: 0.8 mg/dL (ref 0.3–1.2)
Total Protein: 8.2 g/dL — ABNORMAL HIGH (ref 6.5–8.1)

## 2018-11-17 LAB — I-STAT VENOUS BLOOD GAS, ED
Acid-Base Excess: 7 mmol/L — ABNORMAL HIGH (ref 0.0–2.0)
Bicarbonate: 34.5 mmol/L — ABNORMAL HIGH (ref 20.0–28.0)
O2 Saturation: 66 %
PH VEN: 7.376 (ref 7.250–7.430)
TCO2: 36 mmol/L — ABNORMAL HIGH (ref 22–32)
pCO2, Ven: 58.8 mmHg (ref 44.0–60.0)
pO2, Ven: 36 mmHg (ref 32.0–45.0)

## 2018-11-17 LAB — TYPE AND SCREEN
ABO/RH(D): O POS
Antibody Screen: NEGATIVE

## 2018-11-17 LAB — I-STAT CHEM 8, ED
BUN: 14 mg/dL (ref 8–23)
Calcium, Ion: 1.07 mmol/L — ABNORMAL LOW (ref 1.15–1.40)
Chloride: 100 mmol/L (ref 98–111)
Creatinine, Ser: 1.2 mg/dL — ABNORMAL HIGH (ref 0.44–1.00)
Glucose, Bld: 126 mg/dL — ABNORMAL HIGH (ref 70–99)
HCT: 49 % — ABNORMAL HIGH (ref 36.0–46.0)
Hemoglobin: 16.7 g/dL — ABNORMAL HIGH (ref 12.0–15.0)
Potassium: 4.6 mmol/L (ref 3.5–5.1)
Sodium: 138 mmol/L (ref 135–145)
TCO2: 32 mmol/L (ref 22–32)

## 2018-11-17 LAB — I-STAT CG4 LACTIC ACID, ED
Lactic Acid, Venous: 2.66 mmol/L (ref 0.5–1.9)
Lactic Acid, Venous: 2.19 mmol/L (ref 0.5–1.9)

## 2018-11-17 LAB — GLUCOSE, CAPILLARY
Glucose-Capillary: 107 mg/dL — ABNORMAL HIGH (ref 70–99)
Glucose-Capillary: 106 mg/dL — ABNORMAL HIGH (ref 70–99)
Glucose-Capillary: 109 mg/dL — ABNORMAL HIGH (ref 70–99)

## 2018-11-17 LAB — ABO/RH: ABO/RH(D): O POS

## 2018-11-17 LAB — BRAIN NATRIURETIC PEPTIDE: B Natriuretic Peptide: 19 pg/mL (ref 0.0–100.0)

## 2018-11-17 LAB — AMMONIA: Ammonia: 44 umol/L — ABNORMAL HIGH (ref 9–35)

## 2018-11-17 LAB — PROTIME-INR
INR: 1.15
Prothrombin Time: 14.6 seconds (ref 11.4–15.2)

## 2018-11-17 LAB — LIPASE, BLOOD: Lipase: 33 U/L (ref 11–51)

## 2018-11-17 LAB — TSH: TSH: 1.415 u[IU]/mL (ref 0.350–4.500)

## 2018-11-17 LAB — PHOSPHORUS: Phosphorus: 3.7 mg/dL (ref 2.5–4.6)

## 2018-11-17 LAB — I-STAT TROPONIN, ED: Troponin i, poc: 0 ng/mL (ref 0.00–0.08)

## 2018-11-17 LAB — MAGNESIUM: Magnesium: 1.7 mg/dL (ref 1.7–2.4)

## 2018-11-17 MED ORDER — SODIUM CHLORIDE 0.9 % IV SOLN
1.0000 g | Freq: Once | INTRAVENOUS | Status: AC
  Administered 2018-11-17: 1 g via INTRAVENOUS
  Filled 2018-11-17: qty 10

## 2018-11-17 MED ORDER — ACETAMINOPHEN 325 MG PO TABS
650.0000 mg | ORAL_TABLET | Freq: Four times a day (QID) | ORAL | Status: DC | PRN
  Administered 2018-11-18: 650 mg via ORAL
  Filled 2018-11-17: qty 2

## 2018-11-17 MED ORDER — OXYCODONE-ACETAMINOPHEN 5-325 MG PO TABS
1.0000 | ORAL_TABLET | Freq: Three times a day (TID) | ORAL | Status: DC | PRN
  Administered 2018-11-17: 1 via ORAL
  Filled 2018-11-17: qty 1

## 2018-11-17 MED ORDER — OXYCODONE-ACETAMINOPHEN 5-325 MG PO TABS
2.0000 | ORAL_TABLET | Freq: Three times a day (TID) | ORAL | Status: DC | PRN
  Administered 2018-11-17 – 2018-11-18 (×3): 2 via ORAL
  Filled 2018-11-17 (×4): qty 2

## 2018-11-17 MED ORDER — APIXABAN 5 MG PO TABS
5.0000 mg | ORAL_TABLET | Freq: Two times a day (BID) | ORAL | Status: DC
  Administered 2018-11-17 – 2018-11-18 (×2): 5 mg via ORAL
  Filled 2018-11-17 (×2): qty 1

## 2018-11-17 MED ORDER — GABAPENTIN 600 MG PO TABS
300.0000 mg | ORAL_TABLET | Freq: Once | ORAL | Status: AC
  Administered 2018-11-17: 300 mg via ORAL
  Filled 2018-11-17: qty 1

## 2018-11-17 MED ORDER — SODIUM CHLORIDE 0.9 % IV BOLUS
500.0000 mL | Freq: Once | INTRAVENOUS | Status: AC
  Administered 2018-11-17: 500 mL via INTRAVENOUS

## 2018-11-17 MED ORDER — METRONIDAZOLE IN NACL 5-0.79 MG/ML-% IV SOLN
500.0000 mg | Freq: Once | INTRAVENOUS | Status: AC
  Administered 2018-11-17: 500 mg via INTRAVENOUS
  Filled 2018-11-17: qty 100

## 2018-11-17 MED ORDER — INSULIN ASPART 100 UNIT/ML ~~LOC~~ SOLN: 0.0000 [IU] | Freq: Three times a day (TID) | SUBCUTANEOUS | Status: DC

## 2018-11-17 MED ORDER — ENOXAPARIN SODIUM 40 MG/0.4ML ~~LOC~~ SOLN: 40.0000 mg | SUBCUTANEOUS | Status: DC

## 2018-11-17 MED ORDER — ACETAMINOPHEN 650 MG RE SUPP: 650.0000 mg | Freq: Four times a day (QID) | RECTAL | Status: DC | PRN

## 2018-11-17 MED ORDER — FOLIC ACID 1 MG PO TABS
1.0000 mg | ORAL_TABLET | Freq: Every day | ORAL | Status: DC
  Administered 2018-11-17 – 2018-11-18 (×2): 1 mg via ORAL
  Filled 2018-11-17 (×2): qty 1

## 2018-11-17 MED ORDER — LINACLOTIDE 145 MCG PO CAPS
290.0000 ug | ORAL_CAPSULE | Freq: Every day | ORAL | Status: DC
  Administered 2018-11-18: 290 ug via ORAL
  Filled 2018-11-17: qty 2

## 2018-11-17 MED ORDER — INSULIN ASPART 100 UNIT/ML ~~LOC~~ SOLN: 0.0000 [IU] | Freq: Every day | SUBCUTANEOUS | Status: DC

## 2018-11-17 MED ORDER — DIAZEPAM 5 MG PO TABS
5.0000 mg | ORAL_TABLET | Freq: Two times a day (BID) | ORAL | Status: DC | PRN
  Administered 2018-11-17 – 2018-11-18 (×2): 5 mg via ORAL
  Filled 2018-11-17 (×3): qty 1

## 2018-11-17 MED ORDER — VITAMIN B-1 100 MG PO TABS
100.0000 mg | ORAL_TABLET | Freq: Every day | ORAL | Status: DC
  Administered 2018-11-17 – 2018-11-18 (×2): 100 mg via ORAL
  Filled 2018-11-17 (×2): qty 1

## 2018-11-17 MED ORDER — SENNOSIDES-DOCUSATE SODIUM 8.6-50 MG PO TABS
1.0000 | ORAL_TABLET | Freq: Two times a day (BID) | ORAL | Status: DC
  Administered 2018-11-18: 1 via ORAL
  Filled 2018-11-17 (×2): qty 1

## 2018-11-17 NOTE — ED Notes (Signed)
MD bedside to attempt Ultrasound IV

## 2018-11-17 NOTE — Plan of Care (Signed)
  Problem: Spiritual Needs Goal: Ability to function at adequate level 11/17/2018 1443 by Jeanne Ivan, RN Outcome: Progressing 11/17/2018 1442 by Jeanne Ivan, RN Outcome: Progressing   Problem: Education: Goal: Knowledge of General Education information will improve Description Including pain rating scale, medication(s)/side effects and non-pharmacologic comfort measures Outcome: Progressing   Problem: Health Behavior/Discharge Planning: Goal: Ability to manage health-related needs will improve Outcome: Progressing   Problem: Clinical Measurements: Goal: Ability to maintain clinical measurements within normal limits will improve Outcome: Progressing Goal: Will remain free from infection Outcome: Progressing Goal: Diagnostic test results will improve Outcome: Progressing Goal: Respiratory complications will improve Outcome: Progressing Goal: Cardiovascular complication will be avoided Outcome: Progressing   Problem: Activity: Goal: Risk for activity intolerance will decrease Outcome: Progressing   Problem: Nutrition: Goal: Adequate nutrition will be maintained Outcome: Progressing   Problem: Coping: Goal: Level of anxiety will decrease Outcome: Progressing   Problem: Elimination: Goal: Will not experience complications related to bowel motility Outcome: Progressing Goal: Will not experience complications related to urinary retention Outcome: Progressing   Problem: Pain Managment: Goal: General experience of comfort will improve Outcome: Progressing   Problem: Safety: Goal: Ability to remain free from injury will improve Outcome: Progressing   Problem: Skin Integrity: Goal: Risk for impaired skin integrity will decrease Outcome: Progressing

## 2018-11-17 NOTE — ED Notes (Addendum)
Pt 72% on room air, placed on 6L Nasal cannula, brought up to 88%. Pt lethargic and responses are delayed.  Nonrebreather placed on pt, sats increased to 94%. MD bedside at this time.

## 2018-11-17 NOTE — ED Notes (Signed)
Pt is very difficult stick, unsuccessful EJ attempt by this RN. MD aware.

## 2018-11-17 NOTE — ED Triage Notes (Signed)
Pt arrives to ED from home with complaints of syncopal episode since this morning while in the bathroom with her son, pt did not fall all the way to the floor and did not hit her head, pt is on blood thinner from previous stroke per EMS. BP en route was 77/50 for EMS, unable to obtain IV access. Pt has generalized weakness from previous strokes. Pt placed in position of comfort with bed locked and lowered, call bell in reach.

## 2018-11-17 NOTE — ED Notes (Signed)
Pt significantly more alert after administration of fluids and abx. Admitting team bedside updating patient and family on plan of care.

## 2018-11-17 NOTE — Plan of Care (Signed)
  Problem: Spiritual Needs Goal: Ability to function at adequate level Outcome: Progressing   
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
## 2018-11-17 NOTE — Progress Notes (Signed)
Contacted Dr. Eileen Stanford about pain management. Patient wanted to restart home regimen. Dr. Eileen Stanford stated that he wanted to wait till after the xray was preformed and wanted to be cautious due to patient presentation to the ER. Informed patient about needing to wait before advancing pain management. Verbalized understanding. Will continue to monitor.

## 2018-11-17 NOTE — Progress Notes (Signed)
Chaplain responded to spiritual care consult. Patient may be interested in information about Advanced Directive.  Patient was resting when chaplain entered room.  Chaplain spoke briefly with patient and offered to return if patient is ready to ask questions or  complete document.   Tamsen Snider Pager [phone]

## 2018-11-17 NOTE — ED Notes (Signed)
Pt states she took a percocet prior to arrival.

## 2018-11-18 DIAGNOSIS — R918 Other nonspecific abnormal finding of lung field: Secondary | ICD-10-CM | POA: Diagnosis not present

## 2018-11-18 DIAGNOSIS — N179 Acute kidney failure, unspecified: Secondary | ICD-10-CM | POA: Diagnosis not present

## 2018-11-18 DIAGNOSIS — J9621 Acute and chronic respiratory failure with hypoxia: Secondary | ICD-10-CM | POA: Diagnosis not present

## 2018-11-18 DIAGNOSIS — R55 Syncope and collapse: Secondary | ICD-10-CM | POA: Diagnosis not present

## 2018-11-18 LAB — CBC
HCT: 40.3 % (ref 36.0–46.0)
Hemoglobin: 11.7 g/dL — ABNORMAL LOW (ref 12.0–15.0)
MCH: 22.8 pg — ABNORMAL LOW (ref 26.0–34.0)
MCHC: 29 g/dL — ABNORMAL LOW (ref 30.0–36.0)
MCV: 78.6 fL — ABNORMAL LOW (ref 80.0–100.0)
Platelets: 368 10*3/uL (ref 150–400)
RBC: 5.13 MIL/uL — ABNORMAL HIGH (ref 3.87–5.11)
RDW: 21.2 % — ABNORMAL HIGH (ref 11.5–15.5)
WBC: 7.4 10*3/uL (ref 4.0–10.5)
nRBC: 0 % (ref 0.0–0.2)

## 2018-11-18 LAB — COMPREHENSIVE METABOLIC PANEL
ALT: 9 U/L (ref 0–44)
AST: 13 U/L — ABNORMAL LOW (ref 15–41)
Albumin: 3.3 g/dL — ABNORMAL LOW (ref 3.5–5.0)
Alkaline Phosphatase: 74 U/L (ref 38–126)
Anion gap: 10 (ref 5–15)
BUN: 9 mg/dL (ref 8–23)
CO2: 29 mmol/L (ref 22–32)
Calcium: 9.2 mg/dL (ref 8.9–10.3)
Chloride: 102 mmol/L (ref 98–111)
Creatinine, Ser: 0.84 mg/dL (ref 0.44–1.00)
GFR calc Af Amer: >60 mL/min (ref >60)
GFR calc non Af Amer: >60 mL/min (ref >60)
Glucose, Bld: 98 mg/dL (ref 70–99)
Potassium: 3.5 mmol/L (ref 3.5–5.1)
Sodium: 141 mmol/L (ref 135–145)
Total Bilirubin: 0.7 mg/dL (ref 0.3–1.2)
Total Protein: 6.9 g/dL (ref 6.5–8.1)

## 2018-11-18 LAB — GLUCOSE, CAPILLARY: Glucose-Capillary: 103 mg/dL — ABNORMAL HIGH (ref 70–99)

## 2018-11-18 NOTE — Discharge Summary (Signed)
Name: Teresa Wiley MRN: [id number] DOB: 08/27/1956 62 y.o. PCP: Teresa Wiley  Date of Admission: 11/17/2018  9:54 AM Date of Discharge: 11/18/2018 Attending Physician: Dr. Rebeca Alert  Discharge Diagnosis: 1. Near syncope 2/2 Acute hypoxic respiratory failure vs polypharmacy 2.  Acute on chronic constipation 3.  Acute kidney injury 4.  Pulmonary hypertension 5.  Type 2 diabetes mellitus 6.  Chronic anxiety disorder 7.  Chronic pain syndrome  Discharge Medications: Allergies as of 11/18/2018      Reactions   Erythromycin Anaphylaxis, Nausea And Vomiting   Morphine And Related Hives   Pt immediately c/o itching and hives after administration of 4mg  Morphine IV, required Benadryl IV for relief.   Shellfish Allergy Anaphylaxis, Hives   Iodine Hives, Rash   Patient reports not having anaphylaxis to iodine. That was entered in mistake previously.      Medication List    TAKE these medications   albuterol 108 (90 Base) MCG/ACT inhaler Commonly known as:  PROVENTIL HFA;VENTOLIN HFA Inhale 2 puffs into the lungs every 6 (six) hours as needed for wheezing or shortness of breath. For shortness of breath   amLODipine 5 MG tablet Commonly known as:  NORVASC Take 5 mg by mouth daily.   baclofen 10 MG tablet Commonly known as:  LIORESAL Take 10 mg by mouth 3 (three) times daily.   benzonatate 100 MG capsule Commonly known as:  TESSALON PERLES Take 1 capsule (100 mg total) by mouth 3 (three) times daily as needed for cough.   carvedilol 6.25 MG tablet Commonly known as:  COREG Take 6.25 mg by mouth 2 (two) times daily with a meal.   dexlansoprazole 60 MG capsule Commonly known as:  DEXILANT Take 60 mg by mouth daily.   diazepam 5 MG tablet Commonly known as:  VALIUM Take 5 mg by mouth 3 (three) times daily as needed for anxiety.   ELIQUIS 5 MG Tabs tablet Generic drug:  apixaban Take 5 mg by mouth 2 (two) times daily.   furosemide 20 MG tablet Commonly known  as:  LASIX Take 20 mg by mouth daily.   gabapentin 300 MG capsule Commonly known as:  NEURONTIN Take 1 capsule (300 mg total) by mouth 3 (three) times daily. What changed:  Another medication with the same name was removed. Continue taking this medication, and follow the directions you see here.   hydrOXYzine 25 MG tablet Commonly known as:  ATARAX/VISTARIL Take 25 mg by mouth 4 (four) times daily as needed for itching.   JARDIANCE 10 MG Tabs tablet Generic drug:  empagliflozin Take 10 mg by mouth daily.   linaclotide 290 MCG Caps capsule Commonly known as:  LINZESS Take 290 mcg by mouth daily before breakfast.   lisinopril 5 MG tablet Commonly known as:  PRINIVIL,ZESTRIL Take 1 tablet (5 mg total) by mouth daily.   loperamide 2 MG capsule Commonly known as:  IMODIUM Take 1 capsule (2 mg total) by mouth 4 (four) times daily as needed for diarrhea or loose stools.   LORazepam 1 MG tablet Commonly known as:  ATIVAN Take 1 tablet (1 mg total) by mouth 2 (two) times daily.   lubiprostone 24 MCG capsule Commonly known as:  AMITIZA Take 24 mcg by mouth 2 (two) times daily as needed for constipation.   methocarbamol 500 MG tablet Commonly known as:  ROBAXIN Take 1 tablet (500 mg total) by mouth every 6 (six) hours as needed for muscle spasms.   mometasone-formoterol 100-5 MCG/ACT Aero  Commonly known as:  DULERA Inhale 2 puffs into the lungs 2 (two) times daily.   oxyCODONE-acetaminophen 10-325 MG tablet Commonly known as:  PERCOCET Take 1 tablet by mouth 4 (four) times daily.   OXYGEN Inhale 2-3 L into the lungs as needed (shortness of breath).   OZEMPIC (0.25 OR 0.5 MG/DOSE) [place] Inject 0.5 mg into the skin once a week.   sitaGLIPtin 100 MG tablet Commonly known as:  JANUVIA Take 100 mg by mouth daily.   tiotropium 18 MCG inhalation capsule Commonly known as:  SPIRIVA Place 1 capsule (18 mcg total) into inhaler and inhale daily.   Vitamin D (Ergocalciferol) 1.25  MG (50000 UT) Caps capsule Commonly known as:  DRISDOL Take 50,000 Units by mouth every Saturday.       Disposition and follow-up:   TeresaSarann Wiley was discharged from [facility] in [place] condition.  At the hospital follow up visit please address:  1. Near syncope 2/2 Acute hypoxic respiratory failure vs polypharmacy: Please ensure compliance with supplemental oxygen and also consider tapering down opioids and benzodiazepine.  2.  Labs / imaging needed at time of follow-up: BMP   3.  Pending labs/ test needing follow-up: None  Follow-up Appointments:   Hospital Course by problem list: 1. 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  She endorses nonadherence to home supplemental oxygen.  The morning prior to admission, she was straining to have a bowel movement and experienced lightheadedness and dizziness.  She denied syncopal episode, loss of consciousness, seizure-like activities or chest pain.  Teresa Wiley is on multiple sedatives and centrally acting medications including diazepam, Percocet, gabapentin, hydroxyzine, baclofen which might have played a role.  Initially SPO2 in the 60s which improved to 99% on 3 L nasal cannula.  She maintain oxygen saturation in the 90s on nasal cannula and did not report any subsequent episodes of lightheadedness or dizziness.   2.  Acute on chronic constipation: This issue is chronic however it might have been worsened by narcotics.  She takes Linzess and amities a at home.  Nominal x-ray did not show evidence of obstruction.  She was continued on Linzess and Senokot and subsequently had a bowel movement.  3.  Acute kidney injury: This was due to renal hypoperfusion as  she was found to be hypotensive on admission.  She received IV fluids with improvement to renal function.  4.  Pulmonary hypertension: She was maintained on 3 L nasal cannula and continue saturation was in the 90s.  5.  Type 2 diabetes mellitus: She was managed on a sliding scale insulin with bedtime correction.  6.  Chronic anxiety disorder: She was continued on home Valium 500 mg twice daily  7.  Chronic pain syndrome: We slowly resumed her home narcotics.  She was maintained on Percocet 10-325 mg BID and gabapentin.   Discharge Vitals:   BP (!) 149/85 (BP Location: Left Arm)   Pulse 85   Temp 98.2 F (36.8 C) (Oral)   Resp 15   Ht 5\' 9"  (1.753 m)   Wt (!) 145.6 kg   SpO2 (!) 89%   BMI 47.40 kg/m   Pertinent Labs, Studies, and Procedures:  CBC Latest Ref Rng & Units 11/18/2018 11/17/2018 11/17/2018  WBC 4.0 - 10.5 K/uL 7.4 -  10.0  Hemoglobin 12.0 - 15.0 g/dL 11.7(L) 16.7(H) 13.7  Hematocrit 36.0 - 46.0 % 40.3 49.0(H) 47.8(H)  Platelets 150 - 400 K/uL 368 - 440(H)   CMP Latest Ref Rng & Units 11/18/2018 11/17/2018 11/17/2018  Glucose 70 - 99 mg/dL 98 126(H) 127(H)  BUN 8 - 23 mg/dL 9 14 11   Creatinine 0.44 - 1.00 mg/dL 0.84 1.20(H) 1.23(H)  Sodium 135 - 145 mmol/L 141 138 138  Potassium 3.5 - 5.1 mmol/L 3.5 4.6 4.0  Chloride 98 - 111 mmol/L 102 100 99  CO2 22 - 32 mmol/L 29 - 27  Calcium 8.9 - 10.3 mg/dL 9.2 - 9.5  Total Protein 6.5 - 8.1 g/dL 6.9 - 8.2(H)  Total Bilirubin 0.3 - 1.2 mg/dL 0.7 - 0.8  Alkaline Phos 38 - 126 U/L 74 - 87  AST 15 - 41 U/L 13(L) - 24  ALT 0 - 44 U/L 9 - 12    Discharge Instructions: Discharge Instructions    Call MD for:  difficulty breathing, headache or visual disturbances   Complete by:  As directed    Call MD for:  persistant dizziness or light-headedness   Complete by:  As directed    Call MD for:  persistant nausea and vomiting   Complete by:  As directed    Diet - low sodium heart healthy   Complete by:  As directed     Discharge instructions   Complete by:  As directed    Ms. Kraker,  It was a pleasure taking care of you at the hospital.  You were admitted because of dizziness and lightheadedness.  This was due to not wearing your oxygen.  Here my recommendations after this hospital stay:  1.  Please wear your oxygen all the time 2.  Please follow-up with your primary care physician after this visit 3.  For your constipation, continue home regimen  Take care and merry christmas ~Dr. Eileen Stanford   Increase activity slowly   Complete by:  As directed       Signed: Jean Rosenthal, MD 11/19/2018, 10:49 AM   Pager: [phone] IMTS PGY-1

## 2018-11-18 NOTE — Progress Notes (Signed)
Subjective: HD#0   Overnight: She asked to resume her home gabapentin  Today, Ms. Adduci was examined at bedside, she reports that she is feeling okay. She denies any issues breathing with the oxygen. She reports that she is supposed to be on oxygen 24 hours and that the night before admission she got constipated and that the next  morning when she went to the bathroom she was not wearing oxygen. And she was straining to go to the bathroom and that's why she thinks she almost passed out. She only take 4-10mg  percocets per day, she also take tylenol or Advil for her chronic pain. She states that she does not think that it was due to her medications. She reports that she was feeling better today.   Discussed the importance of wearing the oxygen at all times. Discussed the plan for today and she is in agreement.   Objective:  Vital signs in last 24 hours: Vitals:   11/17/18 1300 11/17/18 1433 11/17/18 2009 11/18/18 0731  BP:  (!) 123/96 135/84 (!) 149/85  Pulse: 79 80 86 85  Resp: 18 18 16 15   Temp:   99.1 F (37.3 C) 98.2 F (36.8 C)  TempSrc:   Oral Oral  SpO2: 97% 99% 91% (!) 89%  Weight:      Height:       Constitutional: In no acute distress, lying in bed comfortably Cardiovascular: RRR, no murmurs, gallops, rubs Respiratory: Clear to auscultation Abdomen: Bowel sounds present, distended due to body habitus, mildly tender to palpation at the lower quadrants.  Assessment/Plan:  Principal Problem:   Near syncope Active Problems:   Polypharmacy  Ms. 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  Near syncope 2/2 hypoxia vs polypharmacy: She is more alert this morning and conversational.  Reiterated today that prior to her episode of near syncope, she was straining to have a  bowel movement and did not have her home oxygen on.  Continues to endorse nonadherence to oxygen.  We stressed today about the importance of wearing her oxygen 24/7 to decrease her rate of rehospitalizations and she reported understanding to it.  Maintaining oxygen saturation above 90% on 3L nasal cannula. - Medically safe to discharge - Advised to use supplemental oxygen 24/7.  Acute on chronic constipation: 2 episodes of BM yesterday evening.  Abdominal x-ray with no acute findings.  Continues to endorse lower quadrant abdominal pain. -Continue Linzess and Senokot  ?CAP vs aspiration pneumonia: Very low suspicion.  She status post IV ceftriaxone and Flagyl.  Acute kidney injury:  Resolved. sCr 0.8<<1.2<<1.24 (Baseline 0.58).   Acute on chronic hypoxic respiratory failure Pulmonary hypertension -Continue supplemental oxygen 3 L nasal cannula  Hypertension: BP this a.m. and 140s/80s -Hold home antihypertensives  Polycythemia: Resolved.  Hgb 11.7<<16.7 secondary to chronic hypoxia vs hemoconcentration. -Continue to monitor  Type 2 diabetes mellitus: CBG of 109.  - Continue sliding scale insulin with bedtime correction  Chronic anxiety disorder: Continue Valium 5 mg BID  Chronic pain syndrome: History of DJD of the lumbar spine. Chronically on Percocet 10-325 mg QID.  I strongly believe there is a chance for her to titrate down her opioids and benzos. -Continue Percocet 10-325 mg BID prn pain .  She is to follow-up with her primary care doctor for further discussion.  FEN: Replace electrolytes as needed, heart healthy  diet VTE ppx: Eliquis CODE STATUS: Full code  Dispo: Admit patient to Inpatient with expected length of stay greater than 2 midnights.  Jean Rosenthal, MD 11/18/2018, 7:40 AM Pager: [phone] IMTS PGY-1

## 2018-11-19 LAB — HIV ANTIBODY (ROUTINE TESTING W REFLEX): HIV Screen 4th Generation wRfx: NONREACTIVE

## 2018-11-19 LAB — GLUCOSE, CAPILLARY: Glucose-Capillary: 139 mg/dL — ABNORMAL HIGH (ref 70–99)

## 2018-11-22 LAB — CULTURE, BLOOD (ROUTINE X 2)
Culture: NO GROWTH
Culture: NO GROWTH
SPECIAL REQUESTS: ADEQUATE

## 2018-12-04 ENCOUNTER — Other Ambulatory Visit: Payer: Self-pay

## 2018-12-04 ENCOUNTER — Emergency Department (HOSPITAL_COMMUNITY)
Admission: EM | Admit: 2018-12-04 | Discharge: 2018-12-04 | Disposition: A | Discharge status: Home / Self Care | Payer: Medicaid Other | Attending: Emergency Medicine | Admitting: Emergency Medicine

## 2018-12-04 ENCOUNTER — Emergency Department (HOSPITAL_COMMUNITY): Payer: Medicaid Other

## 2018-12-04 ENCOUNTER — Encounter (HOSPITAL_COMMUNITY): Payer: Self-pay

## 2018-12-04 DIAGNOSIS — M542 Cervicalgia: Secondary | ICD-10-CM | POA: Insufficient documentation

## 2018-12-04 DIAGNOSIS — G8929 Other chronic pain: Secondary | ICD-10-CM | POA: Insufficient documentation

## 2018-12-04 DIAGNOSIS — R197 Diarrhea, unspecified: Secondary | ICD-10-CM | POA: Insufficient documentation

## 2018-12-04 DIAGNOSIS — R112 Nausea with vomiting, unspecified: Secondary | ICD-10-CM | POA: Insufficient documentation

## 2018-12-04 DIAGNOSIS — R110 Nausea: Secondary | ICD-10-CM

## 2018-12-04 DIAGNOSIS — M549 Dorsalgia, unspecified: Secondary | ICD-10-CM | POA: Diagnosis not present

## 2018-12-04 DIAGNOSIS — R531 Weakness: Secondary | ICD-10-CM | POA: Diagnosis not present

## 2018-12-04 LAB — CBC WITH DIFFERENTIAL/PLATELET
Abs Immature Granulocytes: 0.04 10*3/uL (ref 0.00–0.07)
Basophils Absolute: 0 10*3/uL (ref 0.0–0.1)
Basophils Relative: 0 %
Eosinophils Absolute: 0 10*3/uL (ref 0.0–0.5)
Eosinophils Relative: 0 %
HCT: 40.1 % (ref 36.0–46.0)
Hemoglobin: 11.1 g/dL — ABNORMAL LOW (ref 12.0–15.0)
Immature Granulocytes: 1 %
Lymphocytes Relative: 29 %
Lymphs Abs: 2.4 10*3/uL (ref 0.7–4.0)
MCH: 22.9 pg — ABNORMAL LOW (ref 26.0–34.0)
MCHC: 27.7 g/dL — ABNORMAL LOW (ref 30.0–36.0)
MCV: 82.9 fL (ref 80.0–100.0)
Monocytes Absolute: 0.9 10*3/uL (ref 0.1–1.0)
Monocytes Relative: 11 %
Neutro Abs: 5 10*3/uL (ref 1.7–7.7)
Neutrophils Relative %: 59 %
Platelets: 337 10*3/uL (ref 150–400)
RBC: 4.84 MIL/uL (ref 3.87–5.11)
RDW: 20.9 % — ABNORMAL HIGH (ref 11.5–15.5)
WBC: 8.4 10*3/uL (ref 4.0–10.5)
nRBC: 0.2 % (ref 0.0–0.2)

## 2018-12-04 LAB — COMPREHENSIVE METABOLIC PANEL
ALT: 11 U/L (ref 0–44)
AST: 29 U/L (ref 15–41)
Albumin: 3.4 g/dL — ABNORMAL LOW (ref 3.5–5.0)
Alkaline Phosphatase: 75 U/L (ref 38–126)
Anion gap: 9 (ref 5–15)
BUN: 12 mg/dL (ref 8–23)
CO2: 31 mmol/L (ref 22–32)
Calcium: 8.8 mg/dL — ABNORMAL LOW (ref 8.9–10.3)
Chloride: 99 mmol/L (ref 98–111)
Creatinine, Ser: 1.06 mg/dL — ABNORMAL HIGH (ref 0.44–1.00)
GFR calc Af Amer: >60 mL/min (ref >60)
GFR calc non Af Amer: 57 mL/min — ABNORMAL LOW (ref >60)
Glucose, Bld: 93 mg/dL (ref 70–99)
Potassium: 5.7 mmol/L — ABNORMAL HIGH (ref 3.5–5.1)
Sodium: 139 mmol/L (ref 135–145)
Total Bilirubin: 1.5 mg/dL — ABNORMAL HIGH (ref 0.3–1.2)
Total Protein: 6.9 g/dL (ref 6.5–8.1)

## 2018-12-04 LAB — URINALYSIS, ROUTINE W REFLEX MICROSCOPIC
Bilirubin Urine: NEGATIVE
Glucose, UA: NEGATIVE mg/dL
Hgb urine dipstick: NEGATIVE
Ketones, ur: NEGATIVE mg/dL
Leukocytes, UA: NEGATIVE
Nitrite: NEGATIVE
Protein, ur: NEGATIVE mg/dL
Specific Gravity, Urine: 1.019 (ref 1.005–1.030)
pH: 5 (ref 5.0–8.0)

## 2018-12-04 MED ORDER — LOPERAMIDE HCL 2 MG PO CAPS
4.0000 mg | ORAL_CAPSULE | Freq: Once | ORAL | Status: AC
  Administered 2018-12-04: 4 mg via ORAL
  Filled 2018-12-04: qty 2

## 2018-12-04 MED ORDER — SODIUM CHLORIDE 0.9 % IV SOLN
INTRAVENOUS | Status: DC
  Administered 2018-12-04: 18:00:00 via INTRAVENOUS

## 2018-12-04 MED ORDER — ONDANSETRON HCL 4 MG PO TABS: 4.0000 mg | ORAL_TABLET | Freq: Four times a day (QID) | ORAL | 0 refills | Status: DC

## 2018-12-04 MED ORDER — ONDANSETRON HCL 4 MG/2ML IJ SOLN
4.0000 mg | Freq: Once | INTRAMUSCULAR | Status: AC
  Administered 2018-12-04: 4 mg via INTRAVENOUS
  Filled 2018-12-04: qty 2

## 2018-12-04 NOTE — ED Notes (Signed)
Patient transported to X-ray 

## 2018-12-04 NOTE — ED Triage Notes (Signed)
Pt here from home with ems for n.v.d for 3-4 days with fever/chills and generalized weakness. Pt treated for pneumonia on christmas eve and pt recently started on cymbalta 4 days ago. Pt a.o upon arrival, vss

## 2018-12-06 ENCOUNTER — Observation Stay (HOSPITAL_COMMUNITY)
Admission: EM | Admit: 2018-12-06 | Discharge: 2018-12-07 | Disposition: A | Discharge status: Home Health | Payer: Medicaid Other | Attending: Oncology | Admitting: Oncology

## 2018-12-06 ENCOUNTER — Emergency Department (HOSPITAL_COMMUNITY): Payer: Medicaid Other

## 2018-12-06 ENCOUNTER — Encounter (HOSPITAL_COMMUNITY): Payer: Self-pay | Admitting: Emergency Medicine

## 2018-12-06 ENCOUNTER — Other Ambulatory Visit: Payer: Self-pay

## 2018-12-06 DIAGNOSIS — G894 Chronic pain syndrome: Secondary | ICD-10-CM | POA: Insufficient documentation

## 2018-12-06 DIAGNOSIS — Z79899 Other long term (current) drug therapy: Secondary | ICD-10-CM

## 2018-12-06 DIAGNOSIS — E119 Type 2 diabetes mellitus without complications: Secondary | ICD-10-CM | POA: Diagnosis not present

## 2018-12-06 DIAGNOSIS — I272 Pulmonary hypertension, unspecified: Secondary | ICD-10-CM | POA: Insufficient documentation

## 2018-12-06 DIAGNOSIS — Z91013 Allergy to seafood: Secondary | ICD-10-CM

## 2018-12-06 DIAGNOSIS — F419 Anxiety disorder, unspecified: Secondary | ICD-10-CM | POA: Diagnosis not present

## 2018-12-06 DIAGNOSIS — B370 Candidal stomatitis: Secondary | ICD-10-CM | POA: Diagnosis not present

## 2018-12-06 DIAGNOSIS — Z885 Allergy status to narcotic agent status: Secondary | ICD-10-CM | POA: Diagnosis not present

## 2018-12-06 DIAGNOSIS — Z6841 Body Mass Index (BMI) 40.0 and over, adult: Secondary | ICD-10-CM | POA: Diagnosis not present

## 2018-12-06 DIAGNOSIS — Z888 Allergy status to other drugs, medicaments and biological substances status: Secondary | ICD-10-CM | POA: Diagnosis not present

## 2018-12-06 DIAGNOSIS — G929 Unspecified toxic encephalopathy: Secondary | ICD-10-CM | POA: Diagnosis present

## 2018-12-06 DIAGNOSIS — J9601 Acute respiratory failure with hypoxia: Secondary | ICD-10-CM

## 2018-12-06 DIAGNOSIS — Z9119 Patient's noncompliance with other medical treatment and regimen: Secondary | ICD-10-CM

## 2018-12-06 DIAGNOSIS — F329 Major depressive disorder, single episode, unspecified: Secondary | ICD-10-CM

## 2018-12-06 DIAGNOSIS — M6281 Muscle weakness (generalized): Secondary | ICD-10-CM | POA: Insufficient documentation

## 2018-12-06 DIAGNOSIS — Z7984 Long term (current) use of oral hypoglycemic drugs: Secondary | ICD-10-CM | POA: Insufficient documentation

## 2018-12-06 DIAGNOSIS — G92 Toxic encephalopathy: Principal | ICD-10-CM | POA: Insufficient documentation

## 2018-12-06 DIAGNOSIS — M549 Dorsalgia, unspecified: Secondary | ICD-10-CM

## 2018-12-06 DIAGNOSIS — Z960 Presence of urogenital implants: Secondary | ICD-10-CM

## 2018-12-06 DIAGNOSIS — G9340 Encephalopathy, unspecified: Secondary | ICD-10-CM | POA: Diagnosis not present

## 2018-12-06 DIAGNOSIS — Z79891 Long term (current) use of opiate analgesic: Secondary | ICD-10-CM

## 2018-12-06 DIAGNOSIS — Z9981 Dependence on supplemental oxygen: Secondary | ICD-10-CM

## 2018-12-06 DIAGNOSIS — E875 Hyperkalemia: Secondary | ICD-10-CM

## 2018-12-06 DIAGNOSIS — M47816 Spondylosis without myelopathy or radiculopathy, lumbar region: Secondary | ICD-10-CM | POA: Diagnosis not present

## 2018-12-06 DIAGNOSIS — Z87891 Personal history of nicotine dependence: Secondary | ICD-10-CM | POA: Insufficient documentation

## 2018-12-06 DIAGNOSIS — Z7901 Long term (current) use of anticoagulants: Secondary | ICD-10-CM | POA: Diagnosis not present

## 2018-12-06 DIAGNOSIS — K5900 Constipation, unspecified: Secondary | ICD-10-CM

## 2018-12-06 DIAGNOSIS — I110 Hypertensive heart disease with heart failure: Secondary | ICD-10-CM

## 2018-12-06 DIAGNOSIS — N179 Acute kidney failure, unspecified: Secondary | ICD-10-CM | POA: Insufficient documentation

## 2018-12-06 DIAGNOSIS — I509 Heart failure, unspecified: Secondary | ICD-10-CM

## 2018-12-06 DIAGNOSIS — Z9181 History of falling: Secondary | ICD-10-CM | POA: Diagnosis not present

## 2018-12-06 DIAGNOSIS — Z881 Allergy status to other antibiotic agents status: Secondary | ICD-10-CM | POA: Diagnosis not present

## 2018-12-06 DIAGNOSIS — I10 Essential (primary) hypertension: Secondary | ICD-10-CM | POA: Diagnosis not present

## 2018-12-06 DIAGNOSIS — I5030 Unspecified diastolic (congestive) heart failure: Secondary | ICD-10-CM

## 2018-12-06 DIAGNOSIS — Z91048 Other nonmedicinal substance allergy status: Secondary | ICD-10-CM

## 2018-12-06 DIAGNOSIS — R7989 Other specified abnormal findings of blood chemistry: Secondary | ICD-10-CM | POA: Diagnosis not present

## 2018-12-06 DIAGNOSIS — R945 Abnormal results of liver function studies: Secondary | ICD-10-CM | POA: Diagnosis present

## 2018-12-06 DIAGNOSIS — T50905A Adverse effect of unspecified drugs, medicaments and biological substances, initial encounter: Secondary | ICD-10-CM | POA: Insufficient documentation

## 2018-12-06 LAB — BASIC METABOLIC PANEL
Anion gap: 11 (ref 5–15)
BUN: 27 mg/dL — ABNORMAL HIGH (ref 8–23)
CO2: 32 mmol/L (ref 22–32)
Calcium: 9.3 mg/dL (ref 8.9–10.3)
Chloride: 98 mmol/L (ref 98–111)
Creatinine, Ser: 1.39 mg/dL — ABNORMAL HIGH (ref 0.44–1.00)
GFR calc Af Amer: 47 mL/min — ABNORMAL LOW (ref >60)
GFR calc non Af Amer: 41 mL/min — ABNORMAL LOW (ref >60)
Glucose, Bld: 120 mg/dL — ABNORMAL HIGH (ref 70–99)
Potassium: 3.9 mmol/L (ref 3.5–5.1)
Sodium: 141 mmol/L (ref 135–145)

## 2018-12-06 LAB — COMPREHENSIVE METABOLIC PANEL
ALT: 82 U/L — ABNORMAL HIGH (ref 0–44)
AST: 125 U/L — ABNORMAL HIGH (ref 15–41)
Albumin: 3.4 g/dL — ABNORMAL LOW (ref 3.5–5.0)
Alkaline Phosphatase: 73 U/L (ref 38–126)
Anion gap: 14 (ref 5–15)
BUN: 30 mg/dL — ABNORMAL HIGH (ref 8–23)
CO2: 25 mmol/L (ref 22–32)
Calcium: 8.9 mg/dL (ref 8.9–10.3)
Chloride: 100 mmol/L (ref 98–111)
Creatinine, Ser: 1.96 mg/dL — ABNORMAL HIGH (ref 0.44–1.00)
GFR calc Af Amer: 31 mL/min — ABNORMAL LOW (ref >60)
GFR calc non Af Amer: 27 mL/min — ABNORMAL LOW (ref >60)
Glucose, Bld: 125 mg/dL — ABNORMAL HIGH (ref 70–99)
Potassium: 5.5 mmol/L — ABNORMAL HIGH (ref 3.5–5.1)
Sodium: 139 mmol/L (ref 135–145)
Total Bilirubin: 1.6 mg/dL — ABNORMAL HIGH (ref 0.3–1.2)
Total Protein: 6.8 g/dL (ref 6.5–8.1)

## 2018-12-06 LAB — URINALYSIS, ROUTINE W REFLEX MICROSCOPIC
Bilirubin Urine: NEGATIVE
GLUCOSE, UA: NEGATIVE mg/dL
Hgb urine dipstick: NEGATIVE
Ketones, ur: NEGATIVE mg/dL
Leukocytes, UA: NEGATIVE
Nitrite: NEGATIVE
Protein, ur: NEGATIVE mg/dL
Specific Gravity, Urine: 1.012 (ref 1.005–1.030)
pH: 5 (ref 5.0–8.0)

## 2018-12-06 LAB — CBC WITH DIFFERENTIAL/PLATELET
Abs Immature Granulocytes: 0.08 10*3/uL — ABNORMAL HIGH (ref 0.00–0.07)
Basophils Absolute: 0 10*3/uL (ref 0.0–0.1)
Basophils Relative: 0 %
Eosinophils Absolute: 0 10*3/uL (ref 0.0–0.5)
Eosinophils Relative: 0 %
HCT: 41.5 % (ref 36.0–46.0)
Hemoglobin: 12 g/dL (ref 12.0–15.0)
Immature Granulocytes: 1 %
Lymphocytes Relative: 18 %
Lymphs Abs: 1.7 10*3/uL (ref 0.7–4.0)
MCH: 23.6 pg — ABNORMAL LOW (ref 26.0–34.0)
MCHC: 28.9 g/dL — ABNORMAL LOW (ref 30.0–36.0)
MCV: 81.7 fL (ref 80.0–100.0)
Monocytes Absolute: 1.2 10*3/uL — ABNORMAL HIGH (ref 0.1–1.0)
Monocytes Relative: 13 %
Neutro Abs: 6.6 10*3/uL (ref 1.7–7.7)
Neutrophils Relative %: 68 %
Platelets: 347 10*3/uL (ref 150–400)
RBC: 5.08 MIL/uL (ref 3.87–5.11)
RDW: 21.1 % — ABNORMAL HIGH (ref 11.5–15.5)
WBC: 9.6 10*3/uL (ref 4.0–10.5)
nRBC: 0.5 % — ABNORMAL HIGH (ref 0.0–0.2)

## 2018-12-06 LAB — LACTIC ACID, PLASMA
Lactic Acid, Venous: 1.8 mmol/L (ref 0.5–1.9)
Lactic Acid, Venous: 1.2 mmol/L (ref 0.5–1.9)

## 2018-12-06 LAB — CBG MONITORING, ED: Glucose-Capillary: 133 mg/dL — ABNORMAL HIGH (ref 70–99)

## 2018-12-06 LAB — MRSA PCR SCREENING: MRSA by PCR: NEGATIVE

## 2018-12-06 LAB — RAPID URINE DRUG SCREEN, HOSP PERFORMED
Amphetamines: NOT DETECTED
Barbiturates: NOT DETECTED
Benzodiazepines: POSITIVE — AB
Cocaine: NOT DETECTED
Opiates: POSITIVE — AB
Tetrahydrocannabinol: NOT DETECTED

## 2018-12-06 LAB — I-STAT ARTERIAL BLOOD GAS, ED
Acid-Base Excess: 9 mmol/L — ABNORMAL HIGH (ref 0.0–2.0)
Bicarbonate: 35.5 mmol/L — ABNORMAL HIGH (ref 20.0–28.0)
O2 SAT: 93 %
Patient temperature: 99.7
TCO2: 37 mmol/L — AB (ref 22–32)
pCO2 arterial: 55.1 mmHg — ABNORMAL HIGH (ref 32.0–48.0)
pH, Arterial: 7.419 (ref 7.350–7.450)
pO2, Arterial: 69 mmHg — ABNORMAL LOW (ref 83.0–108.0)

## 2018-12-06 LAB — POTASSIUM: Potassium: 4.3 mmol/L (ref 3.5–5.1)

## 2018-12-06 LAB — I-STAT CG4 LACTIC ACID, ED: Lactic Acid, Venous: 2.18 mmol/L (ref 0.5–1.9)

## 2018-12-06 LAB — I-STAT TROPONIN, ED: Troponin i, poc: 0.06 ng/mL (ref 0.00–0.08)

## 2018-12-06 LAB — ETHANOL: Alcohol, Ethyl (B): <10 mg/dL (ref <10)

## 2018-12-06 LAB — GLUCOSE, CAPILLARY
Glucose-Capillary: 126 mg/dL — ABNORMAL HIGH (ref 70–99)
Glucose-Capillary: 114 mg/dL — ABNORMAL HIGH (ref 70–99)

## 2018-12-06 LAB — CK: Total CK: 157 U/L (ref 38–234)

## 2018-12-06 LAB — AMMONIA: Ammonia: 19 umol/L (ref 9–35)

## 2018-12-06 LAB — BRAIN NATRIURETIC PEPTIDE: B Natriuretic Peptide: 293.4 pg/mL — ABNORMAL HIGH (ref 0.0–100.0)

## 2018-12-06 LAB — ACETAMINOPHEN LEVEL: Acetaminophen (Tylenol), Serum: <10 ug/mL — ABNORMAL LOW (ref 10–30)

## 2018-12-06 MED ORDER — ACETAMINOPHEN 650 MG RE SUPP: 650.0000 mg | Freq: Four times a day (QID) | RECTAL | Status: DC | PRN

## 2018-12-06 MED ORDER — SODIUM CHLORIDE 0.9 % IV BOLUS: 1000.0000 mL | Freq: Once | INTRAVENOUS | Status: DC

## 2018-12-06 MED ORDER — LUBIPROSTONE 24 MCG PO CAPS
24.0000 ug | ORAL_CAPSULE | Freq: Two times a day (BID) | ORAL | Status: DC | PRN
  Filled 2018-12-06: qty 1

## 2018-12-06 MED ORDER — CARVEDILOL 12.5 MG PO TABS
6.2500 mg | ORAL_TABLET | Freq: Two times a day (BID) | ORAL | Status: DC
  Administered 2018-12-07: 6.25 mg via ORAL
  Filled 2018-12-06: qty 1

## 2018-12-06 MED ORDER — LIDOCAINE 5 % EX PTCH
1.0000 | MEDICATED_PATCH | CUTANEOUS | Status: DC
  Administered 2018-12-06: 1 via TRANSDERMAL
  Filled 2018-12-06: qty 1

## 2018-12-06 MED ORDER — LINACLOTIDE 145 MCG PO CAPS
290.0000 ug | ORAL_CAPSULE | Freq: Every day | ORAL | Status: DC
  Administered 2018-12-07: 290 ug via ORAL
  Filled 2018-12-06: qty 2

## 2018-12-06 MED ORDER — ALBUTEROL SULFATE (2.5 MG/3ML) 0.083% IN NEBU: 2.5000 mg | INHALATION_SOLUTION | Freq: Four times a day (QID) | RESPIRATORY_TRACT | Status: DC | PRN

## 2018-12-06 MED ORDER — AMLODIPINE BESYLATE 5 MG PO TABS
5.0000 mg | ORAL_TABLET | Freq: Every day | ORAL | Status: DC
  Administered 2018-12-07: 5 mg via ORAL
  Filled 2018-12-06: qty 1

## 2018-12-06 MED ORDER — PANTOPRAZOLE SODIUM 40 MG PO TBEC
40.0000 mg | DELAYED_RELEASE_TABLET | Freq: Every day | ORAL | Status: DC
  Administered 2018-12-07: 40 mg via ORAL
  Filled 2018-12-06: qty 1

## 2018-12-06 MED ORDER — FUROSEMIDE 10 MG/ML IJ SOLN
40.0000 mg | Freq: Once | INTRAMUSCULAR | Status: AC
  Administered 2018-12-06: 40 mg via INTRAVENOUS
  Filled 2018-12-06: qty 4

## 2018-12-06 MED ORDER — AMLODIPINE BESYLATE 5 MG PO TABS: 5.0000 mg | ORAL_TABLET | Freq: Every day | ORAL | Status: DC

## 2018-12-06 MED ORDER — APIXABAN 5 MG PO TABS
5.0000 mg | ORAL_TABLET | Freq: Two times a day (BID) | ORAL | Status: DC
  Administered 2018-12-07 (×2): 5 mg via ORAL
  Filled 2018-12-06 (×2): qty 1

## 2018-12-06 MED ORDER — ACETAMINOPHEN 325 MG PO TABS: 650.0000 mg | ORAL_TABLET | Freq: Four times a day (QID) | ORAL | Status: DC | PRN
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
## 2018-12-06 NOTE — ED Notes (Signed)
Patient transported to CT 

## 2018-12-06 NOTE — ED Notes (Signed)
EMS reports giving a total of 1.2 mg narcan IM

## 2018-12-06 NOTE — Progress Notes (Signed)
Teresa Wiley was re-evaluated at the bedside again this evening. She was more alert and oriented and her son and daughter were present. They stated she was recently started on Cymbalta they think for pain and/or sleep. The son noticed for the past few days she had not been eating and sleeping most of the day. At baseline, she is alert and oriented and able to perform her ADL's. The family was concerned she is taking too many medications and wanted to know if her pain medications could be tapered off or discontinued.    Plan: - restart Valium tomorrow morning, risk of seizure if this is abruptly stopped - will restart smaller doses of some of patient's narcotics tomorrow - ordered a lidocaine patch for better pain control of chronic back pain

## 2018-12-06 NOTE — ED Triage Notes (Signed)
Pt arrives via EMS from home with family reports not being able to wake her up this AM. EMS reports pt was unresponsive and had white powder in her mouth. Pt arrives actively vomiting and moaning, not answering any questions. Family reports pt was just started on medication to help with pain

## 2018-12-06 NOTE — ED Notes (Signed)
Rodell Perna PA made aware of Lactic Acid results. ED-Lab

## 2018-12-06 NOTE — ED Notes (Signed)
IV tem called to start line, and collect blood work.
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
## 2018-12-06 NOTE — ED Notes (Signed)
Patient transported to US 

## 2018-12-07 ENCOUNTER — Encounter (HOSPITAL_COMMUNITY): Payer: Self-pay

## 2018-12-07 DIAGNOSIS — R740 Nonspecific elevation of levels of transaminase and lactic acid dehydrogenase [LDH]: Secondary | ICD-10-CM

## 2018-12-07 DIAGNOSIS — G92 Toxic encephalopathy: Secondary | ICD-10-CM | POA: Diagnosis present

## 2018-12-07 DIAGNOSIS — N179 Acute kidney failure, unspecified: Secondary | ICD-10-CM | POA: Diagnosis not present

## 2018-12-07 DIAGNOSIS — Z86711 Personal history of pulmonary embolism: Secondary | ICD-10-CM

## 2018-12-07 DIAGNOSIS — E875 Hyperkalemia: Secondary | ICD-10-CM | POA: Diagnosis not present

## 2018-12-07 DIAGNOSIS — M47816 Spondylosis without myelopathy or radiculopathy, lumbar region: Secondary | ICD-10-CM

## 2018-12-07 DIAGNOSIS — Z6841 Body Mass Index (BMI) 40.0 and over, adult: Secondary | ICD-10-CM

## 2018-12-07 DIAGNOSIS — Z9049 Acquired absence of other specified parts of digestive tract: Secondary | ICD-10-CM

## 2018-12-07 DIAGNOSIS — G929 Unspecified toxic encephalopathy: Secondary | ICD-10-CM | POA: Diagnosis present

## 2018-12-07 LAB — GLUCOSE, CAPILLARY
Glucose-Capillary: 106 mg/dL — ABNORMAL HIGH (ref 70–99)
Glucose-Capillary: 137 mg/dL — ABNORMAL HIGH (ref 70–99)

## 2018-12-07 LAB — COMPREHENSIVE METABOLIC PANEL
ALT: 77 U/L — ABNORMAL HIGH (ref 0–44)
ANION GAP: 12 (ref 5–15)
AST: 60 U/L — ABNORMAL HIGH (ref 15–41)
Albumin: 3.3 g/dL — ABNORMAL LOW (ref 3.5–5.0)
Alkaline Phosphatase: 71 U/L (ref 38–126)
BUN: 21 mg/dL (ref 8–23)
CALCIUM: 9.1 mg/dL (ref 8.9–10.3)
CO2: 35 mmol/L — ABNORMAL HIGH (ref 22–32)
Chloride: 94 mmol/L — ABNORMAL LOW (ref 98–111)
Creatinine, Ser: 0.98 mg/dL (ref 0.44–1.00)
GFR calc Af Amer: >60 mL/min (ref >60)
GFR calc non Af Amer: >60 mL/min (ref >60)
Glucose, Bld: 103 mg/dL — ABNORMAL HIGH (ref 70–99)
POTASSIUM: 3.3 mmol/L — AB (ref 3.5–5.1)
SODIUM: 141 mmol/L (ref 135–145)
Total Bilirubin: 1.3 mg/dL — ABNORMAL HIGH (ref 0.3–1.2)
Total Protein: 7.2 g/dL (ref 6.5–8.1)

## 2018-12-07 LAB — BILIRUBIN, FRACTIONATED(TOT/DIR/INDIR)
Bilirubin, Direct: 0.2 mg/dL (ref 0.0–0.2)
Indirect Bilirubin: 1.3 mg/dL — ABNORMAL HIGH (ref 0.3–0.9)
Total Bilirubin: 1.5 mg/dL — ABNORMAL HIGH (ref 0.3–1.2)

## 2018-12-07 LAB — HEPATITIS PANEL, ACUTE
HCV Ab: 2.2 s/co ratio — ABNORMAL HIGH (ref 0.0–0.9)
Hep A IgM: NEGATIVE
Hep B C IgM: NEGATIVE
Hepatitis B Surface Ag: NEGATIVE

## 2018-12-07 MED ORDER — OXYCODONE HCL 5 MG PO TABS: 5.0000 mg | ORAL_TABLET | Freq: Four times a day (QID) | ORAL | Status: DC | PRN

## 2018-12-07 MED ORDER — CHLORHEXIDINE GLUCONATE 0.12 % MT SOLN: 15.0000 mL | Freq: Three times a day (TID) | OROMUCOSAL | 0 refills | Status: AC

## 2018-12-07 MED ORDER — POTASSIUM CHLORIDE CRYS ER 20 MEQ PO TBCR
40.0000 meq | EXTENDED_RELEASE_TABLET | Freq: Once | ORAL | Status: AC
  Administered 2018-12-07: 40 meq via ORAL
  Filled 2018-12-07: qty 2

## 2018-12-07 MED ORDER — DIAZEPAM 5 MG PO TABS: 5.0000 mg | ORAL_TABLET | Freq: Three times a day (TID) | ORAL | Status: DC | PRN

## 2018-12-07 MED ORDER — CHLORHEXIDINE GLUCONATE 0.12 % MT SOLN
15.0000 mL | Freq: Three times a day (TID) | OROMUCOSAL | Status: DC
  Administered 2018-12-07: 15 mL via OROMUCOSAL

## 2018-12-07 MED ORDER — OXYCODONE-ACETAMINOPHEN 5-325 MG PO TABS
1.0000 | ORAL_TABLET | Freq: Four times a day (QID) | ORAL | Status: DC | PRN
  Administered 2018-12-07: 1 via ORAL
  Filled 2018-12-07: qty 1

## 2018-12-07 MED ORDER — OXYCODONE-ACETAMINOPHEN 10-325 MG PO TABS: 1.0000 | ORAL_TABLET | Freq: Four times a day (QID) | ORAL | Status: DC | PRN

## 2018-12-07 MED ORDER — FUROSEMIDE 20 MG PO TABS
20.0000 mg | ORAL_TABLET | Freq: Every day | ORAL | Status: DC
  Administered 2018-12-07: 20 mg via ORAL
  Filled 2018-12-07: qty 1

## 2018-12-07 NOTE — Evaluation (Signed)
Physical Therapy Evaluation Patient Details Name: Mikaili Flippin MRN: [id number] DOB: 07/20/1956 Today's Date: 12/07/2018   History of Present Illness  63yo female presenting with AMS, found unresponsive at home and taken to ED via EMS. Found to have hyperkalemia, AKI, increased LFTs, and increased lactic acid. Admitted for encephalopathy. PMH anxiety, chronic back pain, DM, HTN, morbid obesity, pulmonary HTN   Clinical Impression   Patient received in bed, very pleasant and willing to participate in PT, daughter present and observed entirety of session. Able to complete bed mobility with S, functional transfers with min guard and no device, and gait approximately 199f with RW and min guard for safety; SpO2 remained WNL on 6LPM during session but patient easily fatigued. Required cues for safety during session due to mild impulsivity, also assist for line management with mobility. She was left in bed with all needs met, bed alarm active, and daughter present. She will continue to benefit from skilled PT services during this hospital stay, also recommend 24/7 S and HHPT moving forward.     Follow Up Recommendations Home health PT;Supervision/Assistance - 24 hour    Equipment Recommendations  Rolling walker with 5" wheels    Recommendations for Other Services       Precautions / Restrictions Precautions Precautions: Fall;Other (comment) Precaution Comments: watch Spo2  Restrictions Weight Bearing Restrictions: No      Mobility  Bed Mobility Overal bed mobility: Needs Assistance Bed Mobility: Supine to Sit;Sit to Supine     Supine to sit: Supervision Sit to supine: Supervision   General bed mobility comments: S for safety and line management   Transfers Overall transfer level: Needs assistance Equipment used: None Transfers: Sit to/from Stand Sit to Stand: Min guard         General transfer comment: min guard, no assistive devicce; able to march at side of bed with min  guard/no device but requested RW for gait   Ambulation/Gait Ambulation/Gait assistance: Supervision Gait Distance (Feet): 160 Feet Assistive device: Rolling walker (2 wheeled) Gait Pattern/deviations: Step-through pattern;Decreased step length - right;Decreased step length - left;Trunk flexed Gait velocity: decreased    General Gait Details: steady and safe with RW, assist for line management but otherwise ambulating well, easily fatigued; SpO2 WNL on 6LPM   Stairs            Wheelchair Mobility    Modified Rankin (Stroke Patients Only)       Balance Overall balance assessment: Needs assistance;History of Falls Sitting-balance support: No upper extremity supported;Feet supported Sitting balance-Leahy Scale: Good     Standing balance support: Bilateral upper extremity supported;During functional activity Standing balance-Leahy Scale: Fair Standing balance comment: reliance on B UE support                              Pertinent Vitals/Pain Pain Assessment: No/denies pain    Home Living Family/patient expects to be discharged to:: Private residence Living Arrangements: Spouse/significant other Available Help at Discharge: Family;Available 24 hours/day Type of Home: Other(Comment)(townhouse ) Home Access: Stairs to enter Entrance Stairs-Rails: Right;Left Entrance Stairs-Number of Steps: 3 Home Layout: Two level;Bed/bath upstairs;Other (Comment)(son uses bed/bathroom downstairs) Home Equipment: None Additional Comments: had a cane but it broke, usually does not use assistive equipment, recent history of falls on stairs     Prior Function Level of Independence: Needs assistance   Gait / Transfers Assistance Needed: her son does stairs with her, otherwise she reports she was walking  without a device            Hand Dominance        Extremity/Trunk Assessment   Upper Extremity Assessment Upper Extremity Assessment: Defer to OT evaluation     Lower Extremity Assessment Lower Extremity Assessment: Overall WFL for tasks assessed    Cervical / Trunk Assessment Cervical / Trunk Assessment: Normal  Communication   Communication: No difficulties  Cognition Arousal/Alertness: Awake/alert Behavior During Therapy: WFL for tasks assessed/performed;Impulsive Overall Cognitive Status: Impaired/Different from baseline Area of Impairment: Safety/judgement;Orientation;Attention;Memory;Following commands;Awareness;Problem solving                 Orientation Level: Person;Place;Time;Situation Current Attention Level: Selective   Following Commands: Follows one step commands consistently;Follows multi-step commands consistently Safety/Judgement: Decreased awareness of safety Awareness: Intellectual Problem Solving: Slow processing;Decreased initiation General Comments: cues for safety throughout session, patient able to converse with PT but at times asks things like "what company are you with?"       General Comments      Exercises     Assessment/Plan    PT Assessment Patient needs continued PT services  PT Problem List Decreased strength;Decreased balance;Decreased mobility;Decreased knowledge of use of DME;Cardiopulmonary status limiting activity;Obesity;Decreased activity tolerance;Decreased coordination;Decreased safety awareness       PT Treatment Interventions DME instruction;Functional mobility training;Balance training;Patient/family education;Gait training;Therapeutic activities;Neuromuscular re-education;Stair training;Therapeutic exercise    PT Goals (Current goals can be found in the Care Plan section)  Acute Rehab PT Goals Patient Stated Goal: go home  PT Goal Formulation: With patient/family Time For Goal Achievement: 12/21/18 Potential to Achieve Goals: Good    Frequency Min 3X/week   Barriers to discharge        Co-evaluation PT/OT/SLP Co-Evaluation/Treatment: Yes Reason for Co-Treatment: To  address functional/ADL transfers;For patient/therapist safety PT goals addressed during session: Mobility/safety with mobility;Balance;Proper use of DME;Strengthening/ROM         AM-PAC PT "6 Clicks" Mobility  Outcome Measure Help needed turning from your back to your side while in a flat bed without using bedrails?: None Help needed moving from lying on your back to sitting on the side of a flat bed without using bedrails?: None Help needed moving to and from a bed to a chair (including a wheelchair)?: A Little Help needed standing up from a chair using your arms (e.g., wheelchair or bedside chair)?: A Little Help needed to walk in hospital room?: A Little Help needed climbing 3-5 steps with a railing? : A Lot 6 Click Score: 19    End of Session Equipment Utilized During Treatment: Gait belt;Oxygen Activity Tolerance: Patient tolerated treatment well Patient left: in bed;with bed alarm set;with call bell/phone within reach;with family/visitor present Nurse Communication: Mobility status PT Visit Diagnosis: Muscle weakness (generalized) (M62.81);History of falling (Z91.81)    Time: 3818-2993 PT Time Calculation (min) (ACUTE ONLY): 24 min   Charges:   PT Evaluation $PT Eval Low Complexity: 1 Low          Deniece Ree PT, DPT, CBIS  Supplemental Physical Therapist [place]    Pager [phone] Acute Rehab Office [phone]

## 2018-12-07 NOTE — Progress Notes (Signed)
Pharmacy students rounding with [facility] service. Consulted by IMTP/[facility] rounding team to counsel patient on discharge medications. Patient's daughter and wife were present at the time of counseling. We counseled the patient on medications that she should stop and continue/start per discharge instructions, as well as how they should be taken. Patient's wife, who gives the patient her medications, stated that patient is still currently taking Baclofen and lisinopril. The only inhaler she said the patient was taking was Proventil.  [place], [employer]4 Pharmacy Students

## 2018-12-07 NOTE — Progress Notes (Addendum)
   Subjective:   No overnight events. Patient much more alert and oriented this morning. She states she took Cymbalta for 3 days and then started vomiting. The last thing she recalls is being brought to the hospital via EMS. She reports heaving, initially dry heaving which progressed to "liquid heaving." Denies muscle aches or soreness. Denies n/v. She reports some soreness in her abdomen that she thinks may be due to throwing up and heaving. She reports she took the Cymbalta as prescribed. She reports taking Valium, percocet, baclofen, robaxin, hydroxyzine and gabapentin. She also takes an OTC sleep aid that has benadryl in it. She is not currently taking Ativan. Her daughter is present at bedside and reports that the patient is almost back to her baseline, but still seems a little slow. She walked with PT this morning. The patient thinks she's on Eliquis for a heart reason, but is unsure of the details.  Objective:  Vital signs in last 24 hours: Vitals:   12/06/18 1546 12/06/18 1934 12/06/18 2300 12/07/18 0300  BP: (!) 143/89 (!) 143/81 (!) 147/98 139/81  Pulse: 95 90 95 84  Resp: 16 18 18 18   Temp: 99.7 F (37.6 C) 98.4 F (36.9 C) 99.2 F (37.3 C) 99.6 F (37.6 C)  TempSrc: Oral Oral Oral Oral  SpO2: 99% 96% 96% 94%  Weight:      Height:       General: comfortably laying in bed, NAD Neuro: alert and oriented x3, bilateral LE reflexes intact  HENT: oral exudate, EOM nl, PERRLA Lungs: CTA bilaterally  Cardio: RRR, no murmurs  Extremities: no edema  Assessment/Plan:  Active Problems:   Encephalopathy  Ms. Teresa Wiley is a 63 year old female with a history of hypertension, DMII, morbid obesity, pulmonary hypertension on 2.5L supplemental oxygen at home, and HFpEF (EF 55-60% on 12/2015 ECHO) who presented with altered mental status and was admitted to the hospital for further evaluation and management.  Encephalopathy - Mentation is much improved today. The patient and her  daughter would like her to be discharged today. - Likely 2/2 polypharmacy as patient has been taking Valium, Percocet, Robaxin, Baclofen, Gabapentin, Hydroxyzine, and a benadryl containing sleep aid - Patient seems confused about her medications, but her wife helps her manage these at home. Plan - Discontinue Cymbalta - Resume home Valium 5mg  TID PRN for anxiety - Resume home Percocet 10-325mg  QID PRN for pain - Formal med rec with the [facility] pharmacy team. Restart centrally acting medications per recommendations.  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 - Currently holding home baclofen and robaxin 2/2 encephalopathy Plan - Resume home Percocet - Med rec per pharmacy   AKI - Cr improved from 1.96 on admission to 0.98 today (back to baseline)  Elevated LFTs - LFTs and bilirubin have trended down - RUQ US showed no hepatic abnormalities Plan - F/u hepatitis panel  HTN - BP 142/81 this am - Patient reportedly not taking lisinopril at home Plan - Continue amlodipine 5mg  daily  HFpEF - Does not appear to be in an acute heart failure exacerbation given clear lungs and no edema Plan - Continue home lasix and carvedilol  DMII - Blood sugars within goal Plan - CBG monitoring  Oral bacterial overgrowth - Peridex rinse TID  Dispo: Anticipated discharge later today after thorough medication reconciliation.   Rya Rausch, Andree Elk, MD 12/07/2018, 6:48 AM Pager: [phone]

## 2018-12-07 NOTE — Evaluation (Signed)
Occupational Therapy Evaluation Patient Details Name: Teresa Wiley MRN: [id number] DOB: 1956-10-19 Today's Date: 12/07/2018    History of Present Illness 63yo female presenting with AMS, found unresponsive at home and taken to ED via EMS. Found to have hyperkalemia, AKI, increased LFTs, and increased lactic acid. Admitted for encephalopathy. PMH anxiety, chronic back pain, DM, HTN, morbid obesity, pulmonary HTN    Clinical Impression   This 63 y/o female presents with the above. At baseline pt reports she is mod independent with ADL and functional mobility, was not requiring use of AD for ambulation. Pt on home O2 (reports 2.5L at baseline), and requiring increased supplemental (5-6L) O2 to maintain sats above 95% this session. Pt currently requiring minguard assist for UB ADL, minA for LB ADL, and minA for functional mobility using RW. Pt demonstrating cognitive impairments including memory deficits and increased difficulty providing PLOF; daughter present during session to confirm information provided. Pt will benefit from continued acute OT services and recommend follow up [facility] therapy services at time of discharge to maximize her overall safety and independence with ADL and functional mobility. Will follow.     Follow Up Recommendations  Home health OT;Supervision/Assistance - 24 hour    Equipment Recommendations  None recommended by OT           Precautions / Restrictions Precautions Precautions: Fall;Other (comment) Precaution Comments: watch Spo2  Restrictions Weight Bearing Restrictions: No      Mobility Bed Mobility Overal bed mobility: Needs Assistance Bed Mobility: Supine to Sit;Sit to Supine     Supine to sit: Supervision Sit to supine: Supervision   General bed mobility comments: S for safety and line management   Transfers Overall transfer level: Needs assistance Equipment used: None Transfers: Sit to/from Stand Sit to Stand: Min guard         General  transfer comment: min guard, no assistive devicce; able to march at side of bed with min guard/no device but requested RW for gait     Balance Overall balance assessment: Needs assistance;History of Falls Sitting-balance support: No upper extremity supported;Feet supported Sitting balance-Leahy Scale: Good     Standing balance support: Bilateral upper extremity supported;During functional activity Standing balance-Leahy Scale: Fair Standing balance comment: reliance on B UE support during dynamic mobility                           ADL either performed or assessed with clinical judgement   ADL Overall ADL's : Needs assistance/impaired Eating/Feeding: Modified independent;Sitting   Grooming: Set up;Wash/dry face;Sitting   Upper Body Bathing: Min guard;Sitting   Lower Body Bathing: Sit to/from stand;Min guard   Upper Body Dressing : Min guard;Sitting   Lower Body Dressing: Minimal assistance;Sit to/from stand Lower Body Dressing Details (indicate cue type and reason): pt donning socks seated EOB with minguard assist for sitting balance; minA standing balance Toilet Transfer: Minimal assistance;Ambulation;RW Toilet Transfer Details (indicate cue type and reason): simulated via room level mobility using RW Toileting- Clothing Manipulation and Hygiene: Minimal assistance;Sit to/from stand       Functional mobility during ADLs: Minimal assistance;Rolling walker;+2 for safety/equipment General ADL Comments: pt able to tolerate mobility in room and hallway, though with increased fatigue after      Vision         Perception     Praxis      Pertinent Vitals/Pain Pain Assessment: No/denies pain     Hand Dominance     Extremity/Trunk Assessment  Upper Extremity Assessment Upper Extremity Assessment: Generalized weakness   Lower Extremity Assessment Lower Extremity Assessment: Defer to PT evaluation   Cervical / Trunk Assessment Cervical / Trunk Assessment:  Normal   Communication Communication Communication: No difficulties   Cognition Arousal/Alertness: Awake/alert Behavior During Therapy: WFL for tasks assessed/performed;Impulsive Overall Cognitive Status: Impaired/Different from baseline Area of Impairment: Safety/judgement;Orientation;Attention;Memory;Following commands;Awareness;Problem solving                 Orientation Level: Person;Place;Time;Situation Current Attention Level: Selective Memory: Decreased short-term memory Following Commands: Follows one step commands consistently Safety/Judgement: Decreased awareness of safety Awareness: Intellectual Problem Solving: Slow processing;Decreased initiation General Comments: cues for safety throughout session, patient able to converse with therapist but with notable confusion and decreased memory when providing PLOF   General Comments  pt's daughter present during session; pt on 5-6L O2 during session with SpO2 remaining above 95%    Exercises     Shoulder Instructions      Home Living Family/patient expects to be discharged to:: Private residence Living Arrangements: Spouse/significant other Available Help at Discharge: Family;Available 24 hours/day Type of Home: Other(Comment)(townhouse) Home Access: Stairs to enter Entrance Stairs-Number of Steps: 3 Entrance Stairs-Rails: Right;Left Home Layout: Two level;Bed/bath upstairs;Other (Comment)(per chart, son uses bed/bathroom downstairs) Alternate Level Stairs-Number of Steps: 32- son goes up and down stairs with her  Alternate Level Stairs-Rails: Right Bathroom Shower/Tub: Teacher, early years/pre: Standard     Home Equipment: Tub bench   Additional Comments: had a cane but it broke, usually does not use AD for mobility, recent history of falls on stairs       Prior Functioning/Environment Level of Independence: Needs assistance  Gait / Transfers Assistance Needed: pt reports her son provides  supervision for stiars, otherwise she reports she was walking without a device  ADL's / Homemaking Assistance Needed: mod independent             OT Problem List: Decreased strength;Decreased range of motion;Decreased activity tolerance;Impaired balance (sitting and/or standing);Cardiopulmonary status limiting activity;Decreased cognition      OT Treatment/Interventions: Self-care/ADL training;Therapeutic exercise;Energy conservation;Therapeutic activities;Patient/family education;Balance training;Cognitive remediation/compensation;DME and/or AE instruction    OT Goals(Current goals can be found in the care plan section) Acute Rehab OT Goals Patient Stated Goal: go home  OT Goal Formulation: With patient Time For Goal Achievement: 12/21/18 Potential to Achieve Goals: Good  OT Frequency: Min 2X/week   Barriers to D/C:            Co-evaluation PT/OT/SLP Co-Evaluation/Treatment: Yes Reason for Co-Treatment: For patient/therapist safety;To address functional/ADL transfers PT goals addressed during session: Mobility/safety with mobility;Balance;Proper use of DME;Strengthening/ROM OT goals addressed during session: ADL's and self-care;Proper use of Adaptive equipment and DME      AM-PAC OT "6 Clicks" Daily Activity     Outcome Measure Help from another person eating meals?: None Help from another person taking care of personal grooming?: A Little Help from another person toileting, which includes using toliet, bedpan, or urinal?: A Little Help from another person bathing (including washing, rinsing, drying)?: A Little Help from another person to put on and taking off regular upper body clothing?: None Help from another person to put on and taking off regular lower body clothing?: A Little 6 Click Score: 20   End of Session Equipment Utilized During Treatment: Gait belt;Rolling walker;Oxygen Nurse Communication: Mobility status  Activity Tolerance: Patient tolerated treatment  well Patient left: in bed;with call bell/phone within reach;with bed alarm set;with family/visitor present  OT Visit Diagnosis: Muscle weakness (generalized) (M62.81);History of falling (Z91.81)                Time: 3794-3276 OT Time Calculation (min): 25 min Charges:  OT General Charges $OT Visit: 1 Visit OT Evaluation $OT Eval Moderate Complexity: 1 Mod  Lou Cal, OT [employer] Pager [phone] Office [place] 12/07/2018, 10:24 AM

## 2018-12-07 NOTE — Care Management Note (Signed)
Case Management Note  Patient Details  Name: Teresa Wiley MRN: [id number] Date of Birth: Apr 08, 1956  Subjective/Objective:    From home with sign other, for dc today, NCM offered choice for HHPT, [place], patient daughter at bedside chose [facility] , referral given to [facility] for [place], [place].  Await to hear back to see if she can take patient per Butch Penny. Jeneen Rinks brought rolling walker to patient's room.                 Action/Plan: DC home with [facility] services.   Expected Discharge Date:  12/07/18               Expected Discharge Plan:  [place]  In-House Referral:     Discharge planning Services  CM Consult  Post Acute Care Choice:  Durable Medical Equipment, Home Health Choice offered to:  Adult Children  DME Arranged:  Walker rolling DME Agency:  [place] Arranged:  PT, OT [place] Agency:  [place]  Status of Service:  Completed, signed off  If discussed at [place] of Stay Meetings, dates discussed:    Additional Comments:  Zenon Mayo, RN 12/07/2018, 12:36 PM

## 2018-12-07 NOTE — Progress Notes (Signed)
Pharmacy students rounding with I[facility] service. Consulted by IMTP/[facility] rounding team to review patient's medication list. Contacted the office of patient's PCP (Dr. Lennice Sites) and spoke with Jeani Hawking who gave [place] current medication list on file for patient at their office. We then reviewed these medications with the patient and her wife (over the phone) to see how the patient was taking them. Medication list was then reviewed with Dr. Annie Paras.  [place], P4 Pharmacy Students

## 2018-12-07 NOTE — Discharge Summary (Addendum)
Name: Teresa Wiley MRN: [id number] DOB: 03-05-1956 63 y.o. PCP: [name]  Date of Admission: 12/06/2018 10:21 AM Date of Discharge: 12/07/2018 Attending Physician: Dr. Beryle Beams  Discharge Diagnosis: 1. Encephalopathy 2/2 polypharmacy 2. AKI 3. Elevated LFTs 4. 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 Anxiety disorder 6. Oral bacterial overgrowth 7. Chronic anticoagulation with Eliquis  Discharge Medications: Allergies as of 12/07/2018      Reactions   Buprenorphine Hcl Hives   Pt immediately c/o itching and hives after administration of 4mg  Morphine IV, required Benadryl IV for relief. Pt immediately c/o itching and hives after administration of 4mg  Morphine IV, required Benadryl IV for relief.   Erythromycin Anaphylaxis, Nausea And Vomiting   Morphine And Related Hives   Pt immediately c/o itching and hives after administration of 4mg  Morphine IV, required Benadryl IV for relief.   Shellfish Allergy Anaphylaxis, Hives   Iodine Hives, Rash   Patient reports not having anaphylaxis to iodine. That was entered in mistake previously.      Medication List    STOP taking these medications   benzonatate 100 MG capsule Commonly known as:  TESSALON PERLES   DULoxetine 60 MG capsule Commonly known as:  CYMBALTA   hydrOXYzine 25 MG tablet Commonly known as:  ATARAX/VISTARIL   loperamide 2 MG capsule Commonly known as:  IMODIUM   LORazepam 1 MG tablet Commonly known as:  ATIVAN   methocarbamol 500 MG tablet Commonly known as:  ROBAXIN   ondansetron 4 MG tablet Commonly known as:  ZOFRAN     TAKE these medications   albuterol (2.5 MG/3ML) 0.083% nebulizer solution Commonly known as:  PROVENTIL Take 2.5 mg by nebulization every 6 (six) hours as needed for wheezing or shortness of breath.   albuterol 108 (90 Base) MCG/ACT inhaler Commonly known as:  PROVENTIL HFA;VENTOLIN HFA Inhale 2 puffs into the lungs every 6 (six) hours as needed for wheezing or  shortness of breath. For shortness of breath   amLODipine 5 MG tablet Commonly known as:  NORVASC Take 5 mg by mouth daily.   baclofen 10 MG tablet Commonly known as:  LIORESAL Take 10 mg by mouth 3 (three) times daily.   carvedilol 6.25 MG tablet Commonly known as:  COREG Take 6.25 mg by mouth 2 (two) times daily with a meal.   chlorhexidine 0.12 % solution Commonly known as:  PERIDEX Use as directed 15 mLs in the mouth or throat 3 (three) times daily for 10 days.   dexlansoprazole 60 MG capsule Commonly known as:  DEXILANT Take 60 mg by mouth daily.   diazepam 5 MG tablet Commonly known as:  VALIUM Take 5 mg by mouth 3 (three) times daily as needed for anxiety.   ELIQUIS 5 MG Tabs tablet Generic drug:  apixaban Take 5 mg by mouth 2 (two) times daily.   furosemide 20 MG tablet Commonly known as:  LASIX Take 20 mg by mouth daily.   gabapentin 600 MG tablet Commonly known as:  NEURONTIN Take 600 mg by mouth 3 (three) times daily. What changed:  Another medication with the same name was removed. Continue taking this medication, and follow the directions you see here.   ibuprofen 200 MG tablet Commonly known as:  ADVIL,MOTRIN Take 400 mg by mouth every 6 (six) hours as needed for headache (pain).   JARDIANCE 10 MG Tabs tablet Generic drug:  empagliflozin Take 10 mg by mouth daily.   linaclotide 290 MCG Caps capsule Commonly known as:  LINZESS Take 290 mcg by mouth daily before breakfast.   lisinopril 2.5 MG tablet Commonly known as:  PRINIVIL,ZESTRIL Take 2.5 mg by mouth daily. What changed:  Another medication with the same name was removed. Continue taking this medication, and follow the directions you see here.   lubiprostone 24 MCG capsule Commonly known as:  AMITIZA Take 24 mcg by mouth 2 (two) times daily as needed for constipation.   metFORMIN 500 MG tablet Commonly known as:  GLUCOPHAGE Take by mouth 2 (two) times daily with a meal.     mometasone-formoterol 100-5 MCG/ACT Aero Commonly known as:  DULERA Inhale 2 puffs into the lungs 2 (two) times daily.   oxyCODONE-acetaminophen 10-325 MG tablet Commonly known as:  PERCOCET Take 1 tablet by mouth 4 (four) times daily.   OXYGEN Inhale 2.5 L into the lungs as needed (shortness of breath).   OZEMPIC (0.25 OR 0.5 MG/DOSE) [place] Inject 0.5 mg into the skin every Wednesday. Wednesday evening   sitaGLIPtin 100 MG tablet Commonly known as:  JANUVIA Take 100 mg by mouth daily.   tiotropium 18 MCG inhalation capsule Commonly known as:  SPIRIVA Place 1 capsule (18 mcg total) into inhaler and inhale daily.   Vitamin D (Ergocalciferol) 1.25 MG (50000 UT) Caps capsule Commonly known as:  DRISDOL Take 50,000 Units by mouth every Wednesday.            Durable Medical Equipment  (From admission, onward)         Start     Ordered   12/07/18 1227  For home use only DME Walker rolling  Once    Question:  Patient needs a walker to treat with the following condition  Answer:  Weakness   12/07/18 1226          Disposition and follow-up:   Teresa Wiley was discharged from [facility] in Good condition.  At the hospital follow up visit please address:  1. Encephalopathy 2/2 polypharmacy - Returned to baseline mentation before discharge - Discontinued Cymbalta (as that was the most recently added medication and she thought it made her vomit) - Discontinued hydroxyzine (she was unable to specify why she is taking it) - Discontinued robaxin (patient said she was not taking this) - Recommended that she stop taking an OTC sleep aid with benadryl - Continued home Percocet and Valium for pain and anxiety - Continued baclofen PRN   2. Elevated LFTs - AST downtrended from 125 on admission to 60 on discharge. ALT from 82 to 77. Total bili from 1.6 to 1.3.  - RUQ [place] without hepatic abnormalities - Please obtain CMP on f/u to ensure resolution of  abnormalities  3. Oral bacterial overgrowth - Started on peridex rinse TID for 10 days  4. Chronic anticoagulation on Eliquis - It appears the patient is on Eliquis after a PE in 2017 - Please address whether or not this medication can be discontinued in the outpatient setting based on the circumstances of this PE  5.  Labs / imaging needed at time of follow-up: CMP  6.  Pending labs/ test needing follow-up: hepatitis panel  Follow-up Appointments: Follow-up Information    Secundino Ginger, PA-C Follow up in 1 week(s).   Specialty:  Cardiology Contact information: [facility]  [address] [place] [address] [phone]          Hospital Course by problem list: 1. Encephalopathy 2/2 polypharmacy: Teresa Wiley is a79 year old female with a history  of hypertension, DMII, morbid obesity, pulmonary hypertension on 2.5L supplemental oxygen at home, and HFpEF (EF 55-60% on 12/2015 ECHO) who presented with altered mental status likely 2/2 polypharmacy and was admitted to the hospital for further evaluation and management. No signs of infection. Normal head CT. Patient's mentation cleared within 24 hours after holding her home medications. Resumed home percocet, valium, and baclofen. Discontinued Cymbalta as this was the medication started most recently and the patient thought this was the cause of her emesis. Patient reported that she does not take robaxin. She was advised to avoid these medications until speaking with her PCP. She was also advised to stop taking hydroxyzine as well as over the counter benadryl-containing sleep aids. Immediate f/u with PCP was recommended to further discuss medication changes.  2. AKI: Cr decreased from 2 on admission to 1 on discharge. Likely prerenal 2/2 emesis and decreased PO intake while encephalopathic.   3. Elevated LFTs: AST downtrended from 125 on admission to 60 on discharge. ALT from 82 to 77. Total bili from 1.6 to 1.3.  RUQ [place] without hepatic abnormalities. No certain etiology. She should follow-up with her PCP for repeat CMP.   4. 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 Anxiety disorder: Resumed home Valium.  6. Oral bacterial overgrowth: White exudate on tongue. Prescribed 10-day course of peridex rinse TID.  7. Chronic anticoagulation with Eliquis: Her PCP's office reports that the patient has been on Eliquis since a 2017 pulmonary embolus. This medication should be readdressed with her PCP since the thrombotic event was three years ago. Depending on the circumstances of the clot, she may be able to discontinue anticoagulation.  8. PT/OT: discharged home with home health  Discharge Vitals:   BP (!) 142/81 (BP Location: Left Arm)   Pulse 76   Temp 99.5 F (37.5 C) (Oral)   Resp 20   Ht 5\' 9"  (1.753 m)   Wt (!) 145.6 kg   SpO2 97%   BMI 47.40 kg/m   Pertinent Labs, Studies, and Procedures:   CBC Latest Ref Rng & Units 12/06/2018 12/04/2018 11/18/2018  WBC 4.0 - 10.5 K/uL 9.6 8.4 7.4  Hemoglobin 12.0 - 15.0 g/dL 12.0 11.1(L) 11.7(L)  Hematocrit 36.0 - 46.0 % 41.5 40.1 40.3  Platelets 150 - 400 K/uL 347 337 368    CMP Latest Ref Rng & Units 12/07/2018 12/07/2018 12/06/2018  Glucose 70 - 99 mg/dL - 103(H) 120(H)  BUN 8 - 23 mg/dL - 21 27(H)  Creatinine 0.44 - 1.00 mg/dL - 0.98 1.39(H)  Sodium 135 - 145 mmol/L - 141 141  Potassium 3.5 - 5.1 mmol/L - 3.3(L) 3.9  Chloride 98 - 111 mmol/L - 94(L) 98  CO2 22 - 32 mmol/L - 35(H) 32  Calcium 8.9 - 10.3 mg/dL - 9.1 9.3  Total Protein 6.5 - 8.1 g/dL - 7.2 -  Total Bilirubin 0.3 - 1.2 mg/dL 1.5(H) 1.3(H) -  Alkaline Phos 38 - 126 U/L - 71 -  AST 15 - 41 U/L - 60(H) -  ALT 0 - 44 U/L - 77(H) -   Head CT Normal CT of the head without contrast. No acute or focal abnormality to explain altered loss of consciousness.  RUQ [place] - Prior cholecystectomy. - Mild biliary ductal dilatation, with common bile duct measuring 12 mm.  Etiology not visualized by ultrasound. Consider further evaluation with abdomen MRI and MRCP without and with contrast.  Discharge Instructions: Discharge Instructions    Diet - low  sodium heart healthy   Complete by:  As directed    Discharge instructions   Complete by:  As directed    It was a pleasure taking care of you, Ms. Schreurs!  1. You were hospitalized because you were confused and altered. This was likely because of the combination of medications that you take.  2. Please follow the medication list on the discharge instructions. The pharmacy students have gone over these instructions with you as well. The changes we made to your med list - Stop taking hydroxyzine - Stop taking Cymbalta - Stop taking an over the counter sleep aid that contains diphenhydramine (benadryl) - Stop taking robaxin. You can continue taking baclofen as previously prescribed, but only take it as needed for muscle spasms.  3. You should follow up closely with your PCP at his next available appointment to discuss these medication changes. You may need to slowly add some agents back on if you are having difficulty with pain or sleep.  4. You should ask your doctor about Eliquis and whether or not you should continue this medication  Feel free to call our clinic at [phone] if you have any questions.  Thanks, Dr. Annie Paras   Increase activity slowly   Complete by:  As directed       Signed: Franchesca Veneziano, Andree Elk, MD 12/07/2018, 12:33 PM   Pager: [phone]
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
## 2018-12-17 ENCOUNTER — Telehealth: Payer: Self-pay | Admitting: Oncology

## 2018-12-17 NOTE — Telephone Encounter (Signed)
Per Merry Proud from [facility] need verbal orders [phone]

## 2018-12-17 NOTE — Telephone Encounter (Signed)
Return call to [place], [facility] - no answer, left message to give [place] a call back.

## 2018-12-22 NOTE — Telephone Encounter (Signed)
Merry Proud notified that we are unable to give verbal auth for [facility] PT as patient is not a patient at [facility]. Hubbard Hartshorn, RN, BSN

## 2019-02-08 ENCOUNTER — Emergency Department (HOSPITAL_COMMUNITY): Payer: Medicaid Other

## 2019-02-08 ENCOUNTER — Other Ambulatory Visit: Payer: Self-pay

## 2019-02-08 ENCOUNTER — Inpatient Hospital Stay (HOSPITAL_COMMUNITY)
Admission: EM | Admit: 2019-02-08 | Discharge: 2019-02-12 | DRG: 291 | Disposition: A | Discharge status: Home Health | Payer: Medicaid Other | Attending: Internal Medicine | Admitting: Internal Medicine

## 2019-02-08 DIAGNOSIS — J45909 Unspecified asthma, uncomplicated: Secondary | ICD-10-CM | POA: Diagnosis present

## 2019-02-08 DIAGNOSIS — Z7984 Long term (current) use of oral hypoglycemic drugs: Secondary | ICD-10-CM

## 2019-02-08 DIAGNOSIS — F419 Anxiety disorder, unspecified: Secondary | ICD-10-CM | POA: Diagnosis present

## 2019-02-08 DIAGNOSIS — Z8673 Personal history of transient ischemic attack (TIA), and cerebral infarction without residual deficits: Secondary | ICD-10-CM

## 2019-02-08 DIAGNOSIS — J9622 Acute and chronic respiratory failure with hypercapnia: Secondary | ICD-10-CM | POA: Diagnosis present

## 2019-02-08 DIAGNOSIS — E669 Obesity, unspecified: Secondary | ICD-10-CM | POA: Diagnosis present

## 2019-02-08 DIAGNOSIS — E119 Type 2 diabetes mellitus without complications: Secondary | ICD-10-CM | POA: Diagnosis present

## 2019-02-08 DIAGNOSIS — I639 Cerebral infarction, unspecified: Secondary | ICD-10-CM | POA: Diagnosis present

## 2019-02-08 DIAGNOSIS — J9611 Chronic respiratory failure with hypoxia: Secondary | ICD-10-CM | POA: Diagnosis present

## 2019-02-08 DIAGNOSIS — Z8249 Family history of ischemic heart disease and other diseases of the circulatory system: Secondary | ICD-10-CM

## 2019-02-08 DIAGNOSIS — I2699 Other pulmonary embolism without acute cor pulmonale: Secondary | ICD-10-CM | POA: Diagnosis present

## 2019-02-08 DIAGNOSIS — Z79891 Long term (current) use of opiate analgesic: Secondary | ICD-10-CM

## 2019-02-08 DIAGNOSIS — Z6838 Body mass index (BMI) 38.0-38.9, adult: Secondary | ICD-10-CM

## 2019-02-08 DIAGNOSIS — Z881 Allergy status to other antibiotic agents status: Secondary | ICD-10-CM

## 2019-02-08 DIAGNOSIS — Z7901 Long term (current) use of anticoagulants: Secondary | ICD-10-CM

## 2019-02-08 DIAGNOSIS — Z86718 Personal history of other venous thrombosis and embolism: Secondary | ICD-10-CM

## 2019-02-08 DIAGNOSIS — Z79899 Other long term (current) drug therapy: Secondary | ICD-10-CM

## 2019-02-08 DIAGNOSIS — Z888 Allergy status to other drugs, medicaments and biological substances status: Secondary | ICD-10-CM

## 2019-02-08 DIAGNOSIS — G9341 Metabolic encephalopathy: Secondary | ICD-10-CM | POA: Diagnosis present

## 2019-02-08 DIAGNOSIS — G894 Chronic pain syndrome: Secondary | ICD-10-CM | POA: Diagnosis present

## 2019-02-08 DIAGNOSIS — I10 Essential (primary) hypertension: Secondary | ICD-10-CM | POA: Diagnosis present

## 2019-02-08 DIAGNOSIS — E1159 Type 2 diabetes mellitus with other circulatory complications: Secondary | ICD-10-CM | POA: Diagnosis present

## 2019-02-08 DIAGNOSIS — Z9981 Dependence on supplemental oxygen: Secondary | ICD-10-CM

## 2019-02-08 DIAGNOSIS — N179 Acute kidney failure, unspecified: Secondary | ICD-10-CM | POA: Diagnosis present

## 2019-02-08 DIAGNOSIS — D1809 Hemangioma of other sites: Secondary | ICD-10-CM | POA: Diagnosis present

## 2019-02-08 DIAGNOSIS — M549 Dorsalgia, unspecified: Secondary | ICD-10-CM | POA: Diagnosis present

## 2019-02-08 DIAGNOSIS — I5033 Acute on chronic diastolic (congestive) heart failure: Secondary | ICD-10-CM | POA: Diagnosis present

## 2019-02-08 DIAGNOSIS — K589 Irritable bowel syndrome without diarrhea: Secondary | ICD-10-CM | POA: Diagnosis present

## 2019-02-08 DIAGNOSIS — K219 Gastro-esophageal reflux disease without esophagitis: Secondary | ICD-10-CM | POA: Diagnosis present

## 2019-02-08 DIAGNOSIS — J9621 Acute and chronic respiratory failure with hypoxia: Secondary | ICD-10-CM | POA: Diagnosis present

## 2019-02-08 DIAGNOSIS — E1142 Type 2 diabetes mellitus with diabetic polyneuropathy: Secondary | ICD-10-CM | POA: Diagnosis present

## 2019-02-08 DIAGNOSIS — I272 Pulmonary hypertension, unspecified: Secondary | ICD-10-CM | POA: Diagnosis present

## 2019-02-08 DIAGNOSIS — I509 Heart failure, unspecified: Secondary | ICD-10-CM

## 2019-02-08 DIAGNOSIS — I110 Hypertensive heart disease with heart failure: Principal | ICD-10-CM | POA: Diagnosis present

## 2019-02-08 DIAGNOSIS — Z823 Family history of stroke: Secondary | ICD-10-CM

## 2019-02-08 DIAGNOSIS — Z86711 Personal history of pulmonary embolism: Secondary | ICD-10-CM

## 2019-02-08 DIAGNOSIS — Z885 Allergy status to narcotic agent status: Secondary | ICD-10-CM

## 2019-02-08 DIAGNOSIS — Z91013 Allergy to seafood: Secondary | ICD-10-CM

## 2019-02-08 DIAGNOSIS — Z87891 Personal history of nicotine dependence: Secondary | ICD-10-CM

## 2019-02-08 DIAGNOSIS — J9601 Acute respiratory failure with hypoxia: Secondary | ICD-10-CM

## 2019-02-08 LAB — PROTIME-INR
INR: 1.4 — AB (ref 0.8–1.2)
Prothrombin Time: 16.5 seconds — ABNORMAL HIGH (ref 11.4–15.2)

## 2019-02-08 LAB — CBC WITH DIFFERENTIAL/PLATELET
Abs Immature Granulocytes: 0.03 10*3/uL (ref 0.00–0.07)
BASOS ABS: 0 10*3/uL (ref 0.0–0.1)
Basophils Relative: 0 %
Eosinophils Absolute: 0.2 10*3/uL (ref 0.0–0.5)
Eosinophils Relative: 3 %
HCT: 34.9 % — ABNORMAL LOW (ref 36.0–46.0)
Hemoglobin: 9.3 g/dL — ABNORMAL LOW (ref 12.0–15.0)
Immature Granulocytes: 0 %
Lymphocytes Relative: 21 %
Lymphs Abs: 1.5 10*3/uL (ref 0.7–4.0)
MCH: 23.8 pg — ABNORMAL LOW (ref 26.0–34.0)
MCHC: 26.6 g/dL — ABNORMAL LOW (ref 30.0–36.0)
MCV: 89.5 fL (ref 80.0–100.0)
Monocytes Absolute: 0.6 10*3/uL (ref 0.1–1.0)
Monocytes Relative: 8 %
Neutro Abs: 4.9 10*3/uL (ref 1.7–7.7)
Neutrophils Relative %: 68 %
Platelets: 289 10*3/uL (ref 150–400)
RBC: 3.9 MIL/uL (ref 3.87–5.11)
RDW: 18.9 % — AB (ref 11.5–15.5)
WBC: 7.2 10*3/uL (ref 4.0–10.5)
nRBC: 0.6 % — ABNORMAL HIGH (ref 0.0–0.2)

## 2019-02-08 LAB — COMPREHENSIVE METABOLIC PANEL
ALBUMIN: 3.3 g/dL — AB (ref 3.5–5.0)
ALT: 8 U/L (ref 0–44)
AST: 12 U/L — ABNORMAL LOW (ref 15–41)
Alkaline Phosphatase: 81 U/L (ref 38–126)
Anion gap: 7 (ref 5–15)
BUN: 16 mg/dL (ref 8–23)
CO2: 30 mmol/L (ref 22–32)
Calcium: 8.6 mg/dL — ABNORMAL LOW (ref 8.9–10.3)
Chloride: 102 mmol/L (ref 98–111)
Creatinine, Ser: 1.22 mg/dL — ABNORMAL HIGH (ref 0.44–1.00)
GFR calc Af Amer: 55 mL/min — ABNORMAL LOW (ref >60)
GFR calc non Af Amer: 48 mL/min — ABNORMAL LOW (ref >60)
Glucose, Bld: 107 mg/dL — ABNORMAL HIGH (ref 70–99)
Potassium: 4.2 mmol/L (ref 3.5–5.1)
Sodium: 139 mmol/L (ref 135–145)
Total Bilirubin: 0.9 mg/dL (ref 0.3–1.2)
Total Protein: 7.5 g/dL (ref 6.5–8.1)

## 2019-02-08 LAB — POCT I-STAT 7, (LYTES, BLD GAS, ICA,H+H)
Acid-Base Excess: 5 mmol/L — ABNORMAL HIGH (ref 0.0–2.0)
Bicarbonate: 31.6 mmol/L — ABNORMAL HIGH (ref 20.0–28.0)
Calcium, Ion: 1.17 mmol/L (ref 1.15–1.40)
HCT: 31 % — ABNORMAL LOW (ref 36.0–46.0)
Hemoglobin: 10.5 g/dL — ABNORMAL LOW (ref 12.0–15.0)
O2 Saturation: 85 %
PCO2 ART: 58.4 mmHg — AB (ref 32.0–48.0)
Patient temperature: 98.6
Potassium: 4.2 mmol/L (ref 3.5–5.1)
Sodium: 140 mmol/L (ref 135–145)
TCO2: 33 mmol/L — ABNORMAL HIGH (ref 22–32)
pH, Arterial: 7.341 — ABNORMAL LOW (ref 7.350–7.450)
pO2, Arterial: 55 mmHg — ABNORMAL LOW (ref 83.0–108.0)

## 2019-02-08 LAB — POCT I-STAT EG7
Acid-Base Excess: 6 mmol/L — ABNORMAL HIGH (ref 0.0–2.0)
Bicarbonate: 32 mmol/L — ABNORMAL HIGH (ref 20.0–28.0)
Calcium, Ion: 1.03 mmol/L — ABNORMAL LOW (ref 1.15–1.40)
HCT: 32 % — ABNORMAL LOW (ref 36.0–46.0)
Hemoglobin: 10.9 g/dL — ABNORMAL LOW (ref 12.0–15.0)
O2 Saturation: 56 %
Potassium: 4.3 mmol/L (ref 3.5–5.1)
Sodium: 140 mmol/L (ref 135–145)
TCO2: 33 mmol/L — AB (ref 22–32)
pCO2, Ven: 49.6 mmHg (ref 44.0–60.0)
pH, Ven: 7.418 (ref 7.250–7.430)
pO2, Ven: 30 mmHg — CL (ref 32.0–45.0)

## 2019-02-08 LAB — I-STAT TROPONIN, ED: Troponin i, poc: 0 ng/mL (ref 0.00–0.08)

## 2019-02-08 LAB — ETHANOL: Alcohol, Ethyl (B): <10 mg/dL (ref <10)

## 2019-02-08 MED ORDER — FUROSEMIDE 10 MG/ML IJ SOLN
40.0000 mg | INTRAMUSCULAR | Status: AC
  Administered 2019-02-08: 40 mg via INTRAVENOUS
  Filled 2019-02-08: qty 4

## 2019-02-08 MED ORDER — OXYCODONE-ACETAMINOPHEN 5-325 MG PO TABS
1.0000 | ORAL_TABLET | Freq: Three times a day (TID) | ORAL | Status: DC
  Administered 2019-02-09: 1 via ORAL
  Filled 2019-02-08: qty 1

## 2019-02-08 MED ORDER — OXYCODONE-ACETAMINOPHEN 10-325 MG PO TABS: 1.0000 | ORAL_TABLET | Freq: Four times a day (QID) | ORAL | Status: DC

## 2019-02-08 MED ORDER — OXYCODONE HCL 5 MG PO TABS
5.0000 mg | ORAL_TABLET | Freq: Three times a day (TID) | ORAL | Status: DC
  Administered 2019-02-09: 5 mg via ORAL
  Filled 2019-02-08: qty 1
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
## 2019-02-08 NOTE — ED Notes (Signed)
Per SO pt has been C/O SOB X few days, has been "acting strange" X few hours. Pt lethargic in triage. Immediately taken to room.

## 2019-02-08 NOTE — Progress Notes (Signed)
RT to room to collect ABG. Pt currently being transported to [place].

## 2019-02-08 NOTE — ED Notes (Signed)
Pt sats 52% RA in triage, immediately placed on NRB, sats mid 60's

## 2019-02-08 NOTE — ED Provider Notes (Signed)
Medical screening examination/treatment/procedure(s) were conducted as a shared visit with non-physician practitioner(s) and myself.  I personally evaluated the patient during the encounter.  Clinical Impression:   Final diagnoses:  Acute respiratory failure with hypoxia (HCC)  Acute on chronic congestive heart failure, unspecified heart failure type [facility])    The patient is a 63 year old female, she presents in respiratory distress after not using her oxygen this evening.  She presents with O2 sats of 65%, no fever, mild swelling of legs, her cardiogram from 2017 showed ejection fraction of 55 to 79%, grade 1 diastolic dysfunction.  Exam the patient has rales, wheezing, decreased lung sounds and is speaking in 2-3 word sentences in distress.  She has significant hypoxia and is requiring high flow nasal cannula and eventually BiPAP.  Critical care services provided, patient will need to be admitted to a higher level of care.  She will need to be diuresed, she has no leukocytosis or fever suggestive that this is more of either cardiac or effusion process   EKG Interpretation  Date/Time:  Monday February 08 2019 22:26:47 EDT Ventricular Rate:  80 PR Interval:  172 QRS Duration: 78 QT Interval:  388 QTC Calculation: 447 R Axis:   81 Text Interpretation:  Normal sinus rhythm Low voltage QRS Borderline ECG Since last tracing rate slower Confirmed by Noemi Chapel [phone]) on 02/08/2019 10:30:48 PM         Noemi Chapel, MD 02/09/19 2328

## 2019-02-09 ENCOUNTER — Inpatient Hospital Stay (HOSPITAL_COMMUNITY): Payer: Medicaid Other

## 2019-02-09 ENCOUNTER — Encounter (HOSPITAL_COMMUNITY): Payer: Self-pay

## 2019-02-09 ENCOUNTER — Other Ambulatory Visit (HOSPITAL_COMMUNITY): Payer: Self-pay

## 2019-02-09 DIAGNOSIS — K219 Gastro-esophageal reflux disease without esophagitis: Secondary | ICD-10-CM

## 2019-02-09 DIAGNOSIS — Z87891 Personal history of nicotine dependence: Secondary | ICD-10-CM | POA: Diagnosis not present

## 2019-02-09 DIAGNOSIS — G9341 Metabolic encephalopathy: Secondary | ICD-10-CM | POA: Diagnosis not present

## 2019-02-09 DIAGNOSIS — Z8249 Family history of ischemic heart disease and other diseases of the circulatory system: Secondary | ICD-10-CM | POA: Diagnosis not present

## 2019-02-09 DIAGNOSIS — Z9981 Dependence on supplemental oxygen: Secondary | ICD-10-CM | POA: Diagnosis not present

## 2019-02-09 DIAGNOSIS — I2699 Other pulmonary embolism without acute cor pulmonale: Secondary | ICD-10-CM | POA: Diagnosis present

## 2019-02-09 DIAGNOSIS — I272 Pulmonary hypertension, unspecified: Secondary | ICD-10-CM | POA: Diagnosis present

## 2019-02-09 DIAGNOSIS — Z86711 Personal history of pulmonary embolism: Secondary | ICD-10-CM | POA: Diagnosis not present

## 2019-02-09 DIAGNOSIS — N179 Acute kidney failure, unspecified: Secondary | ICD-10-CM | POA: Diagnosis present

## 2019-02-09 DIAGNOSIS — M549 Dorsalgia, unspecified: Secondary | ICD-10-CM | POA: Diagnosis present

## 2019-02-09 DIAGNOSIS — I361 Nonrheumatic tricuspid (valve) insufficiency: Secondary | ICD-10-CM

## 2019-02-09 DIAGNOSIS — J9611 Chronic respiratory failure with hypoxia: Secondary | ICD-10-CM | POA: Diagnosis present

## 2019-02-09 DIAGNOSIS — Z6838 Body mass index (BMI) 38.0-38.9, adult: Secondary | ICD-10-CM | POA: Diagnosis not present

## 2019-02-09 DIAGNOSIS — J9622 Acute and chronic respiratory failure with hypercapnia: Secondary | ICD-10-CM | POA: Diagnosis present

## 2019-02-09 DIAGNOSIS — I10 Essential (primary) hypertension: Secondary | ICD-10-CM

## 2019-02-09 DIAGNOSIS — K589 Irritable bowel syndrome without diarrhea: Secondary | ICD-10-CM | POA: Diagnosis present

## 2019-02-09 DIAGNOSIS — F419 Anxiety disorder, unspecified: Secondary | ICD-10-CM

## 2019-02-09 DIAGNOSIS — D1809 Hemangioma of other sites: Secondary | ICD-10-CM | POA: Diagnosis present

## 2019-02-09 DIAGNOSIS — J45909 Unspecified asthma, uncomplicated: Secondary | ICD-10-CM | POA: Diagnosis present

## 2019-02-09 DIAGNOSIS — Z86718 Personal history of other venous thrombosis and embolism: Secondary | ICD-10-CM | POA: Diagnosis not present

## 2019-02-09 DIAGNOSIS — I5033 Acute on chronic diastolic (congestive) heart failure: Secondary | ICD-10-CM | POA: Diagnosis not present

## 2019-02-09 DIAGNOSIS — J9621 Acute and chronic respiratory failure with hypoxia: Secondary | ICD-10-CM | POA: Diagnosis present

## 2019-02-09 DIAGNOSIS — E1142 Type 2 diabetes mellitus with diabetic polyneuropathy: Secondary | ICD-10-CM | POA: Diagnosis present

## 2019-02-09 DIAGNOSIS — Z8673 Personal history of transient ischemic attack (TIA), and cerebral infarction without residual deficits: Secondary | ICD-10-CM | POA: Diagnosis not present

## 2019-02-09 DIAGNOSIS — G894 Chronic pain syndrome: Secondary | ICD-10-CM | POA: Diagnosis present

## 2019-02-09 DIAGNOSIS — R0602 Shortness of breath: Secondary | ICD-10-CM | POA: Diagnosis present

## 2019-02-09 DIAGNOSIS — J4520 Mild intermittent asthma, uncomplicated: Secondary | ICD-10-CM

## 2019-02-09 DIAGNOSIS — I639 Cerebral infarction, unspecified: Secondary | ICD-10-CM

## 2019-02-09 DIAGNOSIS — I110 Hypertensive heart disease with heart failure: Secondary | ICD-10-CM | POA: Diagnosis present

## 2019-02-09 DIAGNOSIS — E669 Obesity, unspecified: Secondary | ICD-10-CM | POA: Diagnosis present

## 2019-02-09 LAB — MRSA PCR SCREENING: MRSA by PCR: NEGATIVE

## 2019-02-09 LAB — URINALYSIS, ROUTINE W REFLEX MICROSCOPIC
Bilirubin Urine: NEGATIVE
Glucose, UA: NEGATIVE mg/dL
Hgb urine dipstick: NEGATIVE
Ketones, ur: NEGATIVE mg/dL
Leukocytes,Ua: NEGATIVE
Nitrite: NEGATIVE
Protein, ur: NEGATIVE mg/dL
Specific Gravity, Urine: 1.016 (ref 1.005–1.030)
pH: 8 (ref 5.0–8.0)

## 2019-02-09 LAB — ECHOCARDIOGRAM COMPLETE
Height: 69 in
Weight: 4160 oz

## 2019-02-09 LAB — RAPID URINE DRUG SCREEN, HOSP PERFORMED
Amphetamines: NOT DETECTED
Barbiturates: NOT DETECTED
Benzodiazepines: POSITIVE — AB
Cocaine: NOT DETECTED
Opiates: POSITIVE — AB
Tetrahydrocannabinol: NOT DETECTED

## 2019-02-09 LAB — GLUCOSE, CAPILLARY
GLUCOSE-CAPILLARY: 102 mg/dL — AB (ref 70–99)
Glucose-Capillary: 115 mg/dL — ABNORMAL HIGH (ref 70–99)
Glucose-Capillary: 96 mg/dL (ref 70–99)
Glucose-Capillary: 76 mg/dL (ref 70–99)
Glucose-Capillary: 118 mg/dL — ABNORMAL HIGH (ref 70–99)

## 2019-02-09 LAB — BRAIN NATRIURETIC PEPTIDE: B NATRIURETIC PEPTIDE 5: 169.5 pg/mL — AB (ref 0.0–100.0)

## 2019-02-09 MED ORDER — DIAZEPAM 2 MG PO TABS
2.0000 mg | ORAL_TABLET | Freq: Two times a day (BID) | ORAL | Status: DC | PRN
  Administered 2019-02-09: 2 mg via ORAL
  Filled 2019-02-09: qty 1

## 2019-02-09 MED ORDER — SODIUM CHLORIDE 0.9% FLUSH: 3.0000 mL | INTRAVENOUS | Status: DC | PRN

## 2019-02-09 MED ORDER — LUBIPROSTONE 24 MCG PO CAPS
24.0000 ug | ORAL_CAPSULE | Freq: Two times a day (BID) | ORAL | Status: DC | PRN
  Filled 2019-02-09: qty 1

## 2019-02-09 MED ORDER — LINACLOTIDE 145 MCG PO CAPS
290.0000 ug | ORAL_CAPSULE | Freq: Every day | ORAL | Status: DC
  Administered 2019-02-10 – 2019-02-12 (×2): 290 ug via ORAL
  Filled 2019-02-09 (×4): qty 2

## 2019-02-09 MED ORDER — DM-GUAIFENESIN ER 30-600 MG PO TB12
1.0000 | ORAL_TABLET | Freq: Two times a day (BID) | ORAL | Status: DC | PRN
  Administered 2019-02-09: 1 via ORAL
  Filled 2019-02-09: qty 1

## 2019-02-09 MED ORDER — AMLODIPINE BESYLATE 5 MG PO TABS
5.0000 mg | ORAL_TABLET | Freq: Every day | ORAL | Status: DC
  Administered 2019-02-09 – 2019-02-10 (×2): 5 mg via ORAL
  Filled 2019-02-09 (×2): qty 1

## 2019-02-09 MED ORDER — TIOTROPIUM BROMIDE MONOHYDRATE 18 MCG IN CAPS: 18.0000 ug | ORAL_CAPSULE | Freq: Every day | RESPIRATORY_TRACT | Status: DC

## 2019-02-09 MED ORDER — DIAZEPAM 5 MG PO TABS: 5.0000 mg | ORAL_TABLET | Freq: Three times a day (TID) | ORAL | Status: DC | PRN

## 2019-02-09 MED ORDER — SODIUM CHLORIDE 0.9% FLUSH
10.0000 mL | Freq: Two times a day (BID) | INTRAVENOUS | Status: DC
  Administered 2019-02-09 – 2019-02-12 (×6): 10 mL

## 2019-02-09 MED ORDER — NALOXONE HCL 0.4 MG/ML IJ SOLN: 0.2000 mg | Freq: Once | INTRAMUSCULAR | Status: DC

## 2019-02-09 MED ORDER — MOMETASONE FURO-FORMOTEROL FUM 100-5 MCG/ACT IN AERO
2.0000 | INHALATION_SPRAY | Freq: Two times a day (BID) | RESPIRATORY_TRACT | Status: DC
  Administered 2019-02-09 – 2019-02-12 (×5): 2 via RESPIRATORY_TRACT
  Filled 2019-02-09: qty 8.8

## 2019-02-09 MED ORDER — ACETAMINOPHEN 325 MG PO TABS
650.0000 mg | ORAL_TABLET | ORAL | Status: DC | PRN
  Administered 2019-02-12: 650 mg via ORAL
  Filled 2019-02-09: qty 2

## 2019-02-09 MED ORDER — OXYCODONE-ACETAMINOPHEN 5-325 MG PO TABS
1.0000 | ORAL_TABLET | Freq: Four times a day (QID) | ORAL | Status: DC
  Administered 2019-02-09 (×3): 1 via ORAL
  Filled 2019-02-09 (×3): qty 1

## 2019-02-09 MED ORDER — IPRATROPIUM-ALBUTEROL 0.5-2.5 (3) MG/3ML IN SOLN
3.0000 mL | RESPIRATORY_TRACT | Status: DC
  Administered 2019-02-09 (×5): 3 mL via RESPIRATORY_TRACT
  Filled 2019-02-09 (×5): qty 3

## 2019-02-09 MED ORDER — HYDROMORPHONE HCL 1 MG/ML IJ SOLN
0.5000 mg | INTRAMUSCULAR | Status: DC | PRN
  Filled 2019-02-09: qty 1

## 2019-02-09 MED ORDER — HYDRALAZINE HCL 20 MG/ML IJ SOLN: 5.0000 mg | INTRAMUSCULAR | Status: DC | PRN

## 2019-02-09 MED ORDER — ALBUTEROL SULFATE (2.5 MG/3ML) 0.083% IN NEBU: 2.5000 mg | INHALATION_SOLUTION | RESPIRATORY_TRACT | Status: DC | PRN

## 2019-02-09 MED ORDER — FUROSEMIDE 10 MG/ML IJ SOLN
20.0000 mg | Freq: Once | INTRAMUSCULAR | Status: AC
  Administered 2019-02-09: 20 mg via INTRAVENOUS
  Filled 2019-02-09: qty 2

## 2019-02-09 MED ORDER — CARVEDILOL 6.25 MG PO TABS
6.2500 mg | ORAL_TABLET | Freq: Two times a day (BID) | ORAL | Status: DC
  Administered 2019-02-09 – 2019-02-12 (×7): 6.25 mg via ORAL
  Filled 2019-02-09 (×7): qty 1

## 2019-02-09 MED ORDER — ONDANSETRON HCL 4 MG/2ML IJ SOLN: 4.0000 mg | Freq: Four times a day (QID) | INTRAMUSCULAR | Status: DC | PRN

## 2019-02-09 MED ORDER — INSULIN ASPART 100 UNIT/ML ~~LOC~~ SOLN: 0.0000 [IU] | Freq: Every day | SUBCUTANEOUS | Status: DC

## 2019-02-09 MED ORDER — APIXABAN 5 MG PO TABS
5.0000 mg | ORAL_TABLET | Freq: Two times a day (BID) | ORAL | Status: DC
  Administered 2019-02-09 – 2019-02-12 (×7): 5 mg via ORAL
  Filled 2019-02-09 (×7): qty 1

## 2019-02-09 MED ORDER — SODIUM CHLORIDE 0.9% FLUSH
3.0000 mL | Freq: Two times a day (BID) | INTRAVENOUS | Status: DC
  Administered 2019-02-09 – 2019-02-11 (×5): 3 mL via INTRAVENOUS

## 2019-02-09 MED ORDER — IPRATROPIUM-ALBUTEROL 0.5-2.5 (3) MG/3ML IN SOLN
3.0000 mL | Freq: Four times a day (QID) | RESPIRATORY_TRACT | Status: DC
  Administered 2019-02-10 (×2): 3 mL via RESPIRATORY_TRACT
  Filled 2019-02-09: qty 3

## 2019-02-09 MED ORDER — INSULIN ASPART 100 UNIT/ML ~~LOC~~ SOLN
0.0000 [IU] | Freq: Three times a day (TID) | SUBCUTANEOUS | Status: DC
  Administered 2019-02-11: 1 [IU] via SUBCUTANEOUS

## 2019-02-09 MED ORDER — SODIUM CHLORIDE 0.9 % IV SOLN: 250.0000 mL | INTRAVENOUS | Status: DC | PRN

## 2019-02-09 MED ORDER — SODIUM CHLORIDE 0.9% FLUSH: 10.0000 mL | INTRAVENOUS | Status: DC | PRN

## 2019-02-09 NOTE — Progress Notes (Signed)
Received to room 2W03 from ER via stretcher. Assisted to bed and positioned for comfort. In no acute respiratory distress but emotionally distraught and tearful over current health status. Allowed to express feelings and emotional support given. Oriented to room, bed and unit.

## 2019-02-09 NOTE — Progress Notes (Signed)
Teresa Wiley is a 63 y.o. female with medical history significant of HTN, pulmonary HTN, dCHF, DM, chronic anticoagulation on Eliquis 2/2 PE in 2017, chronic pain syndrome on Percocet QID, chronic home O2 requirement of 3L, stroke, GERD, anxiety, polypharmacy, who presents with shortness of breath and altered mental status.  Per her family, pt has been having shortness of breath in the past several days, which has worsened today.  She has dry cough, but no fever or chills.  Not sure if patient has any chest pain.  Patient is confused today.  When I saw patient in the ED, she is orientated to the place, but not oriented to the time and person.  She moves all extremities, no facial droop or slurred speech.  No active nausea, vomiting, diarrhea or abdominal pain.  Not sure if patient has any symptoms for UTI.  Patient was found to have oxygen desaturations of 52% on room air in triage which improved to saturations 100% on 4 L via nasal cannula. Family reports recent 5 day tx with Levaquin for presumed PNA at PCP follow up ~3 weeks ago.  02/09/19: Seen and examined at her bedside.  She is on BiPAP.  She denies any chest pain.  N.p.o. while on BiPAP.  Independently reviewed chest x-ray done on admission which revealed cardiomegaly with increased pulmonary vascularity and bilateral pleural effusions greater on the right.  Please refer to H&P dictated by Dr. Blaine Hamper on 02/09/2019 for further details of the assessment and plan.
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
## 2019-02-09 NOTE — Progress Notes (Signed)
  Echocardiogram 2D Echocardiogram has been performed.  Teresa Wiley 02/09/2019, 4:37 PM

## 2019-02-09 NOTE — Progress Notes (Signed)
Patient complaining her pain medication has not been ordered correctly.  Paged on call physician and discussed plan of care.  No new orders were placed and discussed with the patient she will have to speak with the doctor in the morning.

## 2019-02-10 ENCOUNTER — Encounter (HOSPITAL_COMMUNITY): Payer: Self-pay

## 2019-02-10 ENCOUNTER — Inpatient Hospital Stay (HOSPITAL_COMMUNITY): Payer: Medicaid Other

## 2019-02-10 LAB — GLUCOSE, CAPILLARY
Glucose-Capillary: 102 mg/dL — ABNORMAL HIGH (ref 70–99)
Glucose-Capillary: 97 mg/dL (ref 70–99)
Glucose-Capillary: 91 mg/dL (ref 70–99)
Glucose-Capillary: 123 mg/dL — ABNORMAL HIGH (ref 70–99)

## 2019-02-10 LAB — BASIC METABOLIC PANEL
Anion gap: 7 (ref 5–15)
BUN: 12 mg/dL (ref 8–23)
CO2: 32 mmol/L (ref 22–32)
Calcium: 8.8 mg/dL — ABNORMAL LOW (ref 8.9–10.3)
Chloride: 100 mmol/L (ref 98–111)
Creatinine, Ser: 0.91 mg/dL (ref 0.44–1.00)
GFR calc Af Amer: >60 mL/min (ref >60)
GFR calc non Af Amer: >60 mL/min (ref >60)
Glucose, Bld: 91 mg/dL (ref 70–99)
Potassium: 3.6 mmol/L (ref 3.5–5.1)
SODIUM: 139 mmol/L (ref 135–145)

## 2019-02-10 LAB — RESPIRATORY PANEL BY PCR
Adenovirus: NOT DETECTED
Bordetella pertussis: NOT DETECTED
Chlamydophila pneumoniae: NOT DETECTED
Coronavirus 229E: NOT DETECTED
Coronavirus HKU1: NOT DETECTED
Coronavirus NL63: NOT DETECTED
Coronavirus OC43: NOT DETECTED
Influenza A: NOT DETECTED
Influenza B: NOT DETECTED
Metapneumovirus: NOT DETECTED
Mycoplasma pneumoniae: NOT DETECTED
PARAINFLUENZA VIRUS 2-RVPPCR: NOT DETECTED
Parainfluenza Virus 1: NOT DETECTED
Parainfluenza Virus 3: NOT DETECTED
Parainfluenza Virus 4: NOT DETECTED
RESPIRATORY SYNCYTIAL VIRUS-RVPPCR: NOT DETECTED
Rhinovirus / Enterovirus: NOT DETECTED

## 2019-02-10 LAB — INFLUENZA PANEL BY PCR (TYPE A & B)
Influenza A By PCR: NEGATIVE
Influenza B By PCR: NEGATIVE

## 2019-02-10 LAB — MAGNESIUM: Magnesium: 2.2 mg/dL (ref 1.7–2.4)

## 2019-02-10 MED ORDER — DIAZEPAM 5 MG PO TABS
5.0000 mg | ORAL_TABLET | Freq: Four times a day (QID) | ORAL | Status: DC | PRN
  Administered 2019-02-10 – 2019-02-11 (×3): 5 mg via ORAL
  Filled 2019-02-10 (×3): qty 1

## 2019-02-10 MED ORDER — OXYCODONE-ACETAMINOPHEN 5-325 MG PO TABS
2.0000 | ORAL_TABLET | Freq: Once | ORAL | Status: AC
  Administered 2019-02-10: 2 via ORAL
  Filled 2019-02-10: qty 2

## 2019-02-10 MED ORDER — BACLOFEN 5 MG HALF TABLET: 10.0000 mg | ORAL_TABLET | Freq: Three times a day (TID) | ORAL | Status: DC | PRN

## 2019-02-10 MED ORDER — BACLOFEN 5 MG HALF TABLET: 10.0000 mg | ORAL_TABLET | Freq: Three times a day (TID) | ORAL | Status: DC

## 2019-02-10 MED ORDER — OXYCODONE HCL 5 MG PO TABS
5.0000 mg | ORAL_TABLET | Freq: Three times a day (TID) | ORAL | Status: DC
  Administered 2019-02-10 – 2019-02-11 (×7): 5 mg via ORAL
  Filled 2019-02-10 (×7): qty 1

## 2019-02-10 MED ORDER — OXYCODONE-ACETAMINOPHEN 5-325 MG PO TABS
1.0000 | ORAL_TABLET | Freq: Three times a day (TID) | ORAL | Status: DC
  Administered 2019-02-10 – 2019-02-11 (×7): 1 via ORAL
  Filled 2019-02-10 (×7): qty 1

## 2019-02-10 MED ORDER — GABAPENTIN 400 MG PO CAPS
400.0000 mg | ORAL_CAPSULE | Freq: Three times a day (TID) | ORAL | Status: DC
  Administered 2019-02-10 – 2019-02-11 (×5): 400 mg via ORAL
  Filled 2019-02-10 (×5): qty 1

## 2019-02-10 MED ORDER — PANTOPRAZOLE SODIUM 40 MG PO TBEC
40.0000 mg | DELAYED_RELEASE_TABLET | Freq: Every day | ORAL | Status: DC
  Administered 2019-02-10 – 2019-02-12 (×3): 40 mg via ORAL
  Filled 2019-02-10 (×3): qty 1

## 2019-02-10 MED ORDER — FUROSEMIDE 10 MG/ML IJ SOLN
40.0000 mg | Freq: Two times a day (BID) | INTRAMUSCULAR | Status: DC
  Administered 2019-02-10 – 2019-02-12 (×5): 40 mg via INTRAVENOUS
  Filled 2019-02-10 (×5): qty 4

## 2019-02-10 MED ORDER — IPRATROPIUM-ALBUTEROL 0.5-2.5 (3) MG/3ML IN SOLN
3.0000 mL | Freq: Three times a day (TID) | RESPIRATORY_TRACT | Status: DC
  Administered 2019-02-10 – 2019-02-12 (×6): 3 mL via RESPIRATORY_TRACT
  Filled 2019-02-10 (×6): qty 3

## 2019-02-10 NOTE — Progress Notes (Signed)
Patient woke up in pain and upset.  Explained she was resting with eyes closed and didn't want to wake her.  Patient stating she gets 10-650 mg Percocet 4 times a day and 5 mg Valium 3 times a day and believe she is going through "withdrawls" at this time.  Paged on-call MD to give a Percocet 5-325 mg one-time dose and call for either a K-pad or Valium 2 mg one-time dose.  Asked on-call MD to return the call at this time.  Explained to the patient why the doctor may not have decreased her medications.

## 2019-02-10 NOTE — Progress Notes (Signed)
PROGRESS NOTE  Teresa Wiley UTM:[id number] DOB: August 20, 1956 DOA: 02/08/2019 PCP: Secundino Ginger, PA-C  HPI/Recap of past 24 hours: Teresa Wiley a 63 y.o.femalewith medical history significant ofHTN, pulmonary HTN,dCHF, DM, chronic anticoagulation on Eliquis 2/2 PE in 2017, chronic pain syndrome on Percocet QID, chronic home O2 requirement of 3L,stroke, GERD, anxiety, polypharmacy,whopresents with shortness of breath and altered mental status.  Per her family, pthas been having shortness of breath in the past several days, which has worsened today. She has dry cough, but no fever or chills. Not sure if patient has any chest pain. Patient is confused today. When I saw patient in the ED, she is orientated to the place, but not oriented to the time and person. She moves all extremities,no facial droop or slurred speech. No active nausea,vomiting, diarrhea or abdominal pain. Not sure if patient has any symptoms for UTI. Patient was found to have oxygendesaturations of 52% on room air in triage which improved to saturations100% on 4 L via nasal cannula.Familyreports recent 5 day tx with Levaquin for presumed PNA at PCP follow up ~3 weeks ago.  02/09/19: Seen and examined at her bedside.  She is on BiPAP.  She denies any chest pain.  N.p.o. while on BiPAP.  Independently reviewed chest x-ray done on admission which revealed cardiomegaly with increased pulmonary vascularity and bilateral pleural effusions greater on the right.  02/10/19: Patient seen and examined at bedside.  No acute events overnight.  Reports persistent dyspnea with minimal movement.  CT chest ordered and pending.   Assessment/Plan: Principal Problem:   Acute on chronic respiratory failure with hypoxia and hypercapnia (HCC) Active Problems:   Anxiety   HTN (hypertension)   Type 2 diabetes mellitus with vascular disease (HCC)   Stroke (cerebrum) (HCC)   Polypharmacy   Acute on chronic diastolic CHF  (congestive heart failure) (HCC)   AKI (acute kidney injury) ([place])   PE (pulmonary thromboembolism) (HCC)   Asthma   GERD (gastroesophageal reflux disease)   Acute metabolic encephalopathy  Acute on chronic hypoxic hypercarbic respiratory failure secondary to acute on chronic diastolic CHF and pulmonary hypertension On 2.5 L of oxygen at baseline continuously Currently requiring 5 L to maintain O2 saturation greater than 90% Obtain CT chest with no contrast to further assess Currently on Eliquis for DVT and PE Initially on BiPAP has been weaned off to nasal cannula at 5 L Independently reviewed chest x-ray done on admission which revealed cardiomegaly with increased pulmonary vascularity and bilateral pleural effusion right greater than left Continue daily weight and strict I's and O's Low-salt diet Fluid restriction IV Lasix, bronchodilators, nebs Maintain O2 saturation greater than 92%  Resolved acute metabolic encephalopathy, unclear etiology CT head unremarkable for any acute intracranial findings  Acute on chronic diastolic CHF and pulmonary hypertension Last 2D echo done on 02/04/2019 revealed LVEF 55 to 60% with grade 1 diastolic dysfunction Continue cardiac medications BNP on admission 169 on 02/08/19 from 293 on 12/06/2018  Chronic pain syndrome Resume home medications  Bilateral pleural effusions suspect iatrogenic from acute on chronic diastolic CHF Continue diuretics Management as stated above  IBS Continue Lasix and Amitiza  HTN:  -Continue home medications: Amlodipine, Coreg -Hold lisinopril due to AKI -IV hydralazine prn  Type 2 diabetes mellitus with vascular disease ([place]): Last A1c 7.2. Patient is taking Jardiance, metformin, Ozempic at home -SSI  History of stroke (cerebrum) ([place]): -on eliquis  Resolved AKI:  Presented with Cre 1.22  Baseline creatinine 0.9 Back to her  baseline Continue to avoid nephrotoxic agents/dehydration/hypotension Monitor  urine output  Asthma: -Continue DuoNeb nebulizers, Dulera inhaler, albuterol nebulizers prn  GERD: -Continue Protonix  Hx of  DVT and PE (pulmonary thromboembolism) ([place]):  Continue Eliquis    DVT ppx: on Eliquis Code Status: Full code Family Communication: None at bedside Disposition Plan:  Anticipate discharge back to previous home environment in 1 to 2 days Consults called:  none   Objective: Vitals:   02/10/19 0736 02/10/19 0743 02/10/19 0746 02/10/19 1148  BP: 140/80   131/84  Pulse: 79   85  Resp: 16   18  Temp: 98.4 F (36.9 C)     TempSrc: Oral     SpO2: 94% 95% 96% 96%  Weight:      Height:        Intake/Output Summary (Last 24 hours) at 02/10/2019 1200 Last data filed at 02/09/2019 1624 Gross per 24 hour  Intake -  Output 400 ml  Net -400 ml   Filed Weights   02/08/19 2318  Weight: 117.9 kg    Exam:  . General: 63 y.o. year-old female well developed well nourished in no acute distress.  Alert and oriented x3. . Cardiovascular: Regular rate and rhythm with no rubs or gallops.  No thyromegaly or JVD noted.   Marland Kitchen Respiratory: Mild rales at bases with no wheezes. Good inspiratory effort. . Abdomen: Soft nontender nondistended with normal bowel sounds x4 quadrants. . Musculoskeletal: No lower extremity edema. 2/4 pulses in all 4 extremities. Marland Kitchen Psychiatry: Mood is appropriate for condition and setting   Data Reviewed: CBC: Recent Labs  Lab 02/08/19 2242 02/08/19 2251 02/08/19 2312  WBC 7.2  --   --   NEUTROABS 4.9  --   --   HGB 9.3* 10.9* 10.5*  HCT 34.9* 32.0* 31.0*  MCV 89.5  --   --   PLT 289  --   --    Basic Metabolic Panel: Recent Labs  Lab 02/08/19 2242 02/08/19 2251 02/08/19 2312 02/10/19 0210  NA 139 140 140 139  K 4.2 4.3 4.2 3.6  CL 102  --   --  100  CO2 30  --   --  32  GLUCOSE 107*  --   --  91  BUN 16  --   --  12  CREATININE 1.22*  --   --  0.91  CALCIUM 8.6*  --   --  8.8*  MG  --   --   --  2.2   GFR:  Estimated Creatinine Clearance: 89.1 mL/min (by C-G formula based on SCr of 0.91 mg/dL). Liver Function Tests: Recent Labs  Lab 02/08/19 2242  AST 12*  ALT 8  ALKPHOS 81  BILITOT 0.9  PROT 7.5  ALBUMIN 3.3*   No results for input(s): LIPASE, AMYLASE in the last 168 hours. No results for input(s): AMMONIA in the last 168 hours. Coagulation Profile: Recent Labs  Lab 02/08/19 2242  INR 1.4*   Cardiac Enzymes: No results for input(s): CKTOTAL, CKMB, CKMBINDEX, TROPONINI in the last 168 hours. BNP (last 3 results) No results for input(s): PROBNP in the last 8760 hours. HbA1C: No results for input(s): HGBA1C in the last 72 hours. CBG: Recent Labs  Lab 02/09/19 1128 02/09/19 1612 02/09/19 2150 02/10/19 0734 02/10/19 1147  GLUCAP 76 102* 118* 102* 97   Lipid Profile: No results for input(s): CHOL, HDL, LDLCALC, TRIG, CHOLHDL, LDLDIRECT in the last 72 hours. Thyroid Function Tests: No results for input(s): TSH,  T4TOTAL, FREET4, T3FREE, THYROIDAB in the last 72 hours. Anemia Panel: No results for input(s): VITAMINB12, FOLATE, FERRITIN, TIBC, IRON, RETICCTPCT in the last 72 hours. Urine analysis:    Component Value Date/Time   COLORURINE YELLOW 02/09/2019 1420   APPEARANCEUR CLEAR 02/09/2019 1420   LABSPEC 1.016 02/09/2019 1420   PHURINE 8.0 02/09/2019 1420   GLUCOSEU NEGATIVE 02/09/2019 1420   HGBUR NEGATIVE 02/09/2019 1420   BILIRUBINUR NEGATIVE 02/09/2019 1420   KETONESUR NEGATIVE 02/09/2019 1420   PROTEINUR NEGATIVE 02/09/2019 1420   UROBILINOGEN 1.0 12/07/2014 0200   NITRITE NEGATIVE 02/09/2019 1420   LEUKOCYTESUR NEGATIVE 02/09/2019 1420   Sepsis Labs: @LABRCNTIP (procalcitonin:4,lacticidven:4)  ) Recent Results (from the past 240 hour(s))  MRSA PCR Screening     Status: None   Collection Time: 02/09/19 11:00 AM  Result Value Ref Range Status   MRSA by PCR NEGATIVE NEGATIVE Final    Comment:        The GeneXpert MRSA Assay (FDA approved for NASAL  specimens only), is one component of a comprehensive MRSA colonization surveillance program. It is not intended to diagnose MRSA infection nor to guide or monitor treatment for MRSA infections. Performed at [facility], [place] [address] [place], [place] [address]   Respiratory Panel by PCR     Status: None   Collection Time: 02/10/19  8:13 AM  Result Value Ref Range Status   Adenovirus NOT DETECTED NOT DETECTED Final   Coronavirus 229E NOT DETECTED NOT DETECTED Final    Comment: (NOTE) The Coronavirus on the Respiratory Panel, DOES NOT test for the novel  Coronavirus (2019 nCoV)    Coronavirus HKU1 NOT DETECTED NOT DETECTED Final   Coronavirus NL63 NOT DETECTED NOT DETECTED Final   Coronavirus OC43 NOT DETECTED NOT DETECTED Final   Metapneumovirus NOT DETECTED NOT DETECTED Final   Rhinovirus / Enterovirus NOT DETECTED NOT DETECTED Final   Influenza A NOT DETECTED NOT DETECTED Final   Influenza B NOT DETECTED NOT DETECTED Final   Parainfluenza Virus 1 NOT DETECTED NOT DETECTED Final   Parainfluenza Virus 2 NOT DETECTED NOT DETECTED Final   Parainfluenza Virus 3 NOT DETECTED NOT DETECTED Final   Parainfluenza Virus 4 NOT DETECTED NOT DETECTED Final   Respiratory Syncytial Virus NOT DETECTED NOT DETECTED Final   Bordetella pertussis NOT DETECTED NOT DETECTED Final   Chlamydophila pneumoniae NOT DETECTED NOT DETECTED Final   Mycoplasma pneumoniae NOT DETECTED NOT DETECTED Final    Comment: Performed at [facility], [place]. [address], [place], [place] [address]      Studies: Ct Chest Wo Contrast  Result Date: 02/10/2019 CLINICAL DATA:  Upper chest heaviness and shortness of breath EXAM: CT CHEST WITHOUT CONTRAST TECHNIQUE: Multidetector CT imaging of the chest was performed following the standard protocol without IV contrast. COMPARISON:  12/19/2013 FINDINGS: Cardiovascular: Normal heart size. No pericardial effusion. No vascular findings. Mediastinum/Nodes: Moderate  sliding hiatal hernia. Lungs/Pleura: Small pleural effusions and multi segment dependent atelectasis. Variable airway collapse, advanced in the right. Patchy air trapping at the apices. No generalized Kerley lines or air bronchogram. Subpleural fat expansion, chronic Upper Abdomen: No acute finding Musculoskeletal: No acute or aggressive finding. T11 and T12 hemangioma. IMPRESSION: 1. Multi segment atelectasis and small pleural effusions. 2. 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 3. Moderate hiatal hernia. Electronically Signed   By: Monte Fantasia M.D.   On: 02/10/2019 09:09    Scheduled Meds: . amLODipine  5 mg Oral Daily  .  apixaban  5 mg Oral BID  . carvedilol  6.25 mg Oral BID WC  . furosemide  40 mg Intravenous BID  . gabapentin  400 mg Oral TID  . insulin aspart  0-5 Units Subcutaneous QHS  . insulin aspart  0-9 Units Subcutaneous TID WC  . ipratropium-albuterol  3 mL Nebulization TID  . linaclotide  290 mcg Oral QAC breakfast  . mometasone-formoterol  2 puff Inhalation BID  . naLOXone (NARCAN)  injection  0.2 mg Intravenous Once  . oxyCODONE-acetaminophen  1 tablet Oral TID AC & HS   And  . oxyCODONE  5 mg Oral TID AC & HS  . pantoprazole  40 mg Oral Daily  . sodium chloride flush  10-40 mL Intracatheter Q12H  . sodium chloride flush  3 mL Intravenous Q12H    Continuous Infusions: . sodium chloride       LOS: 1 day     Kayleen Memos, MD [facility] Pager [phone]  If 7PM-7AM, please contact night-coverage [email or web address] Password [email or web address] 02/10/2019, 12:00 PM

## 2019-02-10 NOTE — TOC Initial Note (Signed)
Transition of Care [facility]) - Initial/Assessment Note    Patient Details  Name: Teresa Wiley MRN: [id number] Date of Birth: 1956/02/20  Transition of Care [facility]) CM/SW Contact:    Zenon Mayo, RN Phone Number: 02/10/2019, 2:09 PM  Clinical Narrative:                 Patient from home with spouse, pta indep, she has home oxygen with [employer] 2.5 to 3 liters but not continously. She has a walker and a shower seat at home. She uses  [facility]. She will have transportation at dc. She has a PCP, she states she has no problem getting medications but she would like the attending MD here to help her get her other medications filled that she takes at home. NCM informed her about [facility] for CHF at home, she states she does not want a HHRN for CHF disease management.   Expected Discharge Plan: [place] Barriers to Discharge: No Barriers Identified   Patient Goals and CMS Choice Patient states their goals for this hospitalization and ongoing recovery are:: rest, get use to using oxygen continously   Choice offered to / list presented to : NA  Expected Discharge Plan and Services Expected Discharge Plan: [place] Discharge Planning Services: CM Consult Post Acute Care Choice: NA Living arrangements for the past 2 months: Single Family Home                          Prior Living Arrangements/Services Living arrangements for the past 2 months: Single Family Home Lives with:: Spouse   Do you feel safe going back to the place where you live?: Yes        Care giver support system in place?: Yes (comment)(spouse) Current home services: DME(home oxygen with [employer])    Activities of Daily Living Home Assistive Devices/Equipment: Shower chair with back ADL Screening (condition at time of admission) Patient's cognitive ability adequate to safely complete daily activities?: Yes Is the patient deaf or have difficulty hearing?: No Does the  patient have difficulty seeing, even when wearing glasses/contacts?: No Does the patient have difficulty concentrating, remembering, or making decisions?: No Patient able to express need for assistance with ADLs?: No Does the patient have difficulty dressing or bathing?: No Independently performs ADLs?: Yes (appropriate for developmental age) Does the patient have difficulty walking or climbing stairs?: No Weakness of Legs: Both Weakness of Arms/Hands: None  Permission Sought/Granted                  Emotional Assessment   Attitude/Demeanor/Rapport: Engaged Affect (typically observed): Appropriate Orientation: : Oriented to Self, Oriented to Place, Oriented to  Time, Oriented to Situation   Psych Involvement: No (comment)  Admission diagnosis:  Acute respiratory failure with hypoxia ([place]) [J96.01] Acute on chronic congestive heart failure, unspecified heart failure type [facility]) [I50.9] Patient Active Problem List   Diagnosis Date Noted  . Acute on chronic diastolic CHF (congestive heart failure) ([place]) 02/09/2019  . AKI (acute kidney injury) ([place]) 02/09/2019  . PE (pulmonary thromboembolism) ([place]) 02/09/2019  . Asthma 02/09/2019  . GERD (gastroesophageal reflux disease) 02/09/2019  . Acute metabolic encephalopathy 40/98/1191  . Acute on chronic respiratory failure with hypoxia and hypercapnia ([place]) 02/09/2019  . Encephalopathy, toxic 12/07/2018  . Encephalopathy 12/06/2018  . Near syncope 11/17/2018  . Polypharmacy 11/17/2018  . Acute respiratory failure with hypoxia ([employer]) 01/31/2018  . Type 2 diabetes  mellitus with vascular disease ([place]) 01/10/2016  . ARF (acute renal failure) ([place]) 01/10/2016  . Stroke (cerebrum) ([place]) 01/10/2016  . Cerebral infarction due to unspecified mechanism   . Narcotic dependence ([place]) 10/18/2014  . Chronic pain syndrome 10/18/2014  . Diabetes type 2, controlled ([place])   . Pulmonary HTN ([place])   . Syncope 07/19/2014  . S/P cholecystectomy  07/19/2014  . Abnormal EKG 07/19/2014  . Sludge in gallbladder 03/09/2014  . Steatohepatitis, nonalcoholic 09/32/6712  . Tobacco abuse 03/09/2014  . Right-sided chest wall pain 03/09/2014  . Right flank pain 03/09/2014  . Right lateral abdominal pain (RUQ & RLQ) 03/09/2014  . Urinary incontinence in female 03/09/2014  . Coccydynia 03/09/2014  . Nocturnal hypoxemia due to obesity 03/09/2014  . DM (diabetes mellitus) ([place]) 03/09/2014  . HTN (hypertension) 03/09/2014  . 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 Chronic back pain   . Obesity, morbid, BMI 40.0-49.9 ([place])   . Anxiety 03/08/2014  . Disequilibrium 11/15/2012  . Weakness 11/15/2012  . DDD (degenerative disc disease)    PCP:  Secundino Ginger, PA-C Pharmacy:   [facility] [phone] [place], [place] - [place] [address] [place] [address] Phone: [phone] Fax: [phone]  [place], [place] [address] Phone: [phone] Fax: [phone]  [facility] [phone] - [place], [address] AT [place] [address] Phone: [phone] Fax: [phone]     Social Determinants of Health (SDOH) Interventions    Readmission Risk Interventions 30 Day Unplanned Readmission Risk Score     ED to Hosp-Admission (Current) from 02/08/2019 in [place] [facility] [place] [facility]  30 Day Unplanned Readmission Risk Score (%)  26 Filed at 02/10/2019 1200     This score is the patient's risk of an unplanned readmission within 30 days of being discharged (0 -100%). The score is based on dignosis, age, lab data, medications, orders, and past utilization.   Low:  0-14.9   Medium: 15-21.9   High: 22-29.9   Extreme: 30 and above       Readmission Risk Prevention Plan 02/10/2019  Transportation Screening Complete  HRI or Home Care  Consult Patient refused  Palliative Care Screening Not Applicable  Medication Review (RN Care Manager) Complete  Some recent data might be hidden

## 2019-02-11 LAB — GLUCOSE, CAPILLARY
GLUCOSE-CAPILLARY: 121 mg/dL — AB (ref 70–99)
GLUCOSE-CAPILLARY: 138 mg/dL — AB (ref 70–99)
Glucose-Capillary: 100 mg/dL — ABNORMAL HIGH (ref 70–99)
Glucose-Capillary: 86 mg/dL (ref 70–99)
Glucose-Capillary: 82 mg/dL (ref 70–99)

## 2019-02-11 LAB — BASIC METABOLIC PANEL
Anion gap: 8 (ref 5–15)
BUN: 14 mg/dL (ref 8–23)
CO2: 28 mmol/L (ref 22–32)
Calcium: 9.2 mg/dL (ref 8.9–10.3)
Chloride: 100 mmol/L (ref 98–111)
Creatinine, Ser: 0.87 mg/dL (ref 0.44–1.00)
GFR calc Af Amer: >60 mL/min (ref >60)
GFR calc non Af Amer: >60 mL/min (ref >60)
Glucose, Bld: 106 mg/dL — ABNORMAL HIGH (ref 70–99)
Potassium: 3.5 mmol/L (ref 3.5–5.1)
Sodium: 136 mmol/L (ref 135–145)

## 2019-02-11 LAB — HEMOGLOBIN A1C
Hgb A1c MFr Bld: 5.6 % (ref 4.8–5.6)
Mean Plasma Glucose: 114.02 mg/dL

## 2019-02-11 MED ORDER — GABAPENTIN 100 MG PO CAPS
100.0000 mg | ORAL_CAPSULE | Freq: Three times a day (TID) | ORAL | Status: DC
  Administered 2019-02-12: 100 mg via ORAL
  Filled 2019-02-11: qty 1

## 2019-02-11 MED ORDER — MOMETASONE FURO-FORMOTEROL FUM 100-5 MCG/ACT IN AERO: 2.0000 | INHALATION_SPRAY | Freq: Two times a day (BID) | RESPIRATORY_TRACT | 0 refills | Status: DC

## 2019-02-11 MED ORDER — DIAZEPAM 2 MG PO TABS
2.0000 mg | ORAL_TABLET | Freq: Two times a day (BID) | ORAL | Status: DC | PRN
  Administered 2019-02-11: 2 mg via ORAL
  Filled 2019-02-11: qty 1

## 2019-02-11 MED ORDER — FUROSEMIDE 40 MG PO TABS: 40.0000 mg | ORAL_TABLET | Freq: Two times a day (BID) | ORAL | 0 refills | Status: DC

## 2019-02-11 MED ORDER — OXYCODONE-ACETAMINOPHEN 5-325 MG PO TABS
1.0000 | ORAL_TABLET | Freq: Two times a day (BID) | ORAL | Status: DC | PRN
  Administered 2019-02-11 – 2019-02-12 (×2): 1 via ORAL
  Filled 2019-02-11 (×2): qty 1

## 2019-02-11 MED ORDER — CARVEDILOL 6.25 MG PO TABS: 6.2500 mg | ORAL_TABLET | Freq: Two times a day (BID) | ORAL | 0 refills | Status: DC

## 2019-02-11 NOTE — Progress Notes (Signed)
Pt walked with physical therapy this evening. She became very dizzy and reported her vision becoming "cloudy". Pt had to be assisted to sit immediately. (Pt was on 3L of O2 then 4L  when ambulating). Vital signs stable - no orthostatic hypotension. Pt reports this happens frequently to her outside of hospital. RN will continue to monitor. MD messaged.

## 2019-02-11 NOTE — Discharge Summary (Signed)
Discharge Summary  Teresa Wiley [id number]:[id number] DOB: [birth year]  PCP: Teresa Ginger, PA-C  Admit date: 02/08/2019 Discharge date: 02/11/2019  Time spent: 35 minutes  Recommendations for Outpatient Follow-up:  1. Follow-up with your primary care physician 2. Take your medications as prescribed  Discharge Diagnoses:  Active Hospital Problems   Diagnosis Date Noted  . Acute on chronic respiratory failure with hypoxia and hypercapnia ([place]) 02/09/2019  . Acute on chronic diastolic CHF (congestive heart failure) ([place]) 02/09/2019  . AKI (acute kidney injury) ([place]) 02/09/2019  . PE (pulmonary thromboembolism) ([place]) 02/09/2019  . Asthma 02/09/2019  . GERD (gastroesophageal reflux disease) 02/09/2019  . Acute metabolic encephalopathy 09/32/6712  . Polypharmacy 11/17/2018  . 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 Type 2 diabetes mellitus with vascular disease ([place]) 01/10/2016  . HTN (hypertension) 03/09/2014  . Anxiety 03/08/2014    Resolved Hospital Problems  No resolved problems to display.    Discharge Condition: Stable  Diet recommendation: Resume previous diet  Vitals:   02/11/19 0717 02/11/19 0803  BP: 133/88   Pulse: 73 72  Resp:  11  Temp: 97.9 F (36.6 C)   SpO2: 97% 97%    History of present illness:  Teresa Wiley a 63 y.o.femalewith medical history significant ofHTN, pulmonary HTN,dCHF, DM, chronic anticoagulation on Eliquis 2/2 PE in 2017, chronic pain syndrome on Percocet QID, chronic home O2 requirement of 3L,stroke, GERD, anxiety, polypharmacy,whopresents with shortness of breath and altered mental status.  Per her family, pthas been having shortness of breath in the past several days, which has worsened today. She has dry cough, but no fever or chills. Not sure if patient has any chest pain. Patient is confused today. When I saw patient in the ED, she is orientated to the place, but not oriented to the time and person. She moves all  extremities,no facial droop or slurred speech. No active nausea,vomiting, diarrhea or abdominal pain. Not sure if patient has any symptoms for UTI. Patient was found to have oxygendesaturations of 52% on room air in triage which improved to saturations100% on 4 L via nasal cannula.Familyreports recent 5 day tx with Levaquin for presumed PNA at PCP follow up ~3 weeks ago.  Hospital course complicated by acute on chronic hypoxic hypercarbic respiratory failure requiring BiPAP.  Placed on IV diuretics and tolerated well with improvement of respiratory status.  02/11/19: Seen and examined with her daughter present at bedside.  No acute events overnight.  She is now off BiPAP.  She has no new complaints.  On the day of discharge, the patient was hemodynamically stable.  She will need to follow-up with her primary care provider for polypharmacy.  Patient understands and agrees to plan.    Hospital Course:  Principal Problem:   Acute on chronic respiratory failure with hypoxia and hypercapnia (HCC) Active Problems:   Anxiety   HTN (hypertension)   Type 2 diabetes mellitus with vascular disease (HCC)   Stroke (cerebrum) (HCC)   Polypharmacy   Acute on chronic diastolic CHF (congestive heart failure) (HCC)   AKI (acute kidney injury) ([place])   PE (pulmonary thromboembolism) (HCC)   Asthma   GERD (gastroesophageal reflux disease)   Acute metabolic encephalopathy  Resolving acute on chronic hypoxic hypercarbic respiratory failure secondary to acute on chronic diastolic CHF and pulmonary hypertension On 3 L of oxygen at baseline continuously Continue Eliquis for DVT and PE Initially on BiPAP has been weaned off to nasal cannula Home O2 evaluation prior to discharge  Influenza A&B, viral  panel negative  vital signs stable Afebrile with no leukocytosis  Resolved acute metabolic encephalopathy, suspect secondary to polypharmacy CT head unremarkable for any acute intracranial findings  Patient advised to follow-up with her primary care provider for adjustment of her medications Patient declines changes/adjustments of her opiates and benzodiazepines medications  Acute on chronic diastolic CHF and pulmonary hypertension Last 2D echo done on 02/04/2019 revealed LVEF 55 to 60% with grade 1 diastolic dysfunction Continue cardiac medications BNP on admission 169 on 02/08/19 from 293 on 12/06/2018  Chronic pain syndrome Resume home medications  Bilateral pleural effusions suspect iatrogenic from acute on chronic diastolic CHF Continue diuretics Management as stated above  IBS Continue Lasix and Amitiza  HTN:  -Continue home medications:Amlodipine, Coreg -Hold lisinopril due to AKI -IV hydralazine prn  Type 2 diabetes mellitus with vascular disease (HCC):Last A1c7.2. Patient is takingJardiance, metformin, Ozempicat home Resume metformin 500 mg twice daily Avoid hypoglycemia for medications adjustment Follow-up with your PCP  History of stroke (cerebrum) ([place]): -on eliquis  Resolved AKI: Presented with Cre 1.22  Baseline creatinine 0.9 Back to her baseline Continue to avoid nephrotoxic agents/dehydration/hypotension Monitor urine output  Asthma: -Continue DuoNeb nebulizers, Dulera inhaler, albuterol nebulizersprn  GERD: -Continue Protonix  Hx of DVT and PE (pulmonary thromboembolism) ([place]): Continue Eliquis  T11 and T12 hemangioma Follow-up with your PCP    DVT ppx:on Eliquis Code Status:Full code       Discharge Exam: BP 133/88   Pulse 72   Temp 97.9 F (36.6 C) (Oral)   Resp 11   Ht 5\' 9"  (1.753 m)   Wt 117.9 kg   SpO2 97%   BMI 38.40 kg/m  . General: 63 y.o. year-old female well developed well nourished in no acute distress.  Alert and oriented x3. . Cardiovascular: Regular rate and rhythm with no rubs or gallops.  No thyromegaly or JVD noted.   Marland Kitchen Respiratory: Clear to auscultation with no wheezes or  rales.  Poor inspiratory effort. . Abdomen: Soft nontender nondistended with normal bowel sounds x4 quadrants. . Musculoskeletal: No lower extremity edema. 2/4 pulses in all 4 extremities. Marland Kitchen Psychiatry: Mood is appropriate for condition and setting  Discharge Instructions You were cared for by a hospitalist during your hospital stay. If you have any questions about your discharge medications or the care you received while you were in the hospital after you are discharged, you can call the unit and asked to speak with the hospitalist on call if the hospitalist that took care of you is not available. Once you are discharged, your primary care physician will handle any further medical issues. Please note that NO REFILLS for any discharge medications will be authorized once you are discharged, as it is imperative that you return to your primary care physician (or establish a relationship with a primary care physician if you do not have one) for your aftercare needs so that they can reassess your need for medications and monitor your lab values.   Allergies as of 02/11/2019      Reactions   Buprenorphine Hcl Hives   Pt immediately c/o itching and hives after administration of 4mg  Morphine IV, required Benadryl IV for relief. Pt immediately c/o itching and hives after administration of 4mg  Morphine IV, required Benadryl IV for relief.   Erythromycin Anaphylaxis, Nausea And Vomiting   Morphine And Related Hives   Pt immediately c/o itching and hives after administration of 4mg  Morphine IV, required Benadryl IV for relief.   Shellfish Allergy Anaphylaxis,  Hives   Iodine Hives, Rash   Patient reports not having anaphylaxis to iodine. That was entered in mistake previously.      Medication List    STOP taking these medications   amLODipine 5 MG tablet Commonly known as:  NORVASC   ibuprofen 200 MG tablet Commonly known as:  ADVIL,MOTRIN   Jardiance 10 MG Tabs tablet Generic drug:  empagliflozin    lisinopril 2.5 MG tablet Commonly known as:  PRINIVIL,ZESTRIL   OZEMPIC (0.25 OR 0.5 MG/DOSE) [place]   sitaGLIPtin 100 MG tablet Commonly known as:  JANUVIA   tiotropium 18 MCG inhalation capsule Commonly known as:  SPIRIVA     TAKE these medications   albuterol (2.5 MG/3ML) 0.083% nebulizer solution Commonly known as:  PROVENTIL Take 2.5 mg by nebulization every 6 (six) hours as needed for wheezing or shortness of breath.   albuterol 108 (90 Base) MCG/ACT inhaler Commonly known as:  PROVENTIL HFA;VENTOLIN HFA Inhale 2 puffs into the lungs every 6 (six) hours as needed for wheezing or shortness of breath. For shortness of breath   baclofen 10 MG tablet Commonly known as:  LIORESAL Take 10 mg by mouth 3 (three) times daily.   carvedilol 6.25 MG tablet Commonly known as:  COREG Take 1 tablet (6.25 mg total) by mouth 2 (two) times daily with a meal. What changed:    medication strength  how much to take   chlorhexidine 0.12 % solution Commonly known as:  PERIDEX Use as directed 15 mLs in the mouth or throat 2 (two) times daily.   dexlansoprazole 60 MG capsule Commonly known as:  DEXILANT Take 60 mg by mouth daily.   diazepam 5 MG tablet Commonly known as:  VALIUM Take 5 mg by mouth 3 (three) times daily as needed for anxiety.   DULoxetine 60 MG capsule Commonly known as:  CYMBALTA Take 60 mg by mouth daily.   Eliquis 5 MG Tabs tablet Generic drug:  apixaban Take 5 mg by mouth 2 (two) times daily.   furosemide 40 MG tablet Commonly known as:  LASIX Take 1 tablet (40 mg total) by mouth 2 (two) times daily for 15 days. What changed:  when to take this   gabapentin 600 MG tablet Commonly known as:  NEURONTIN Take 600 mg by mouth 3 (three) times daily.   linaclotide 290 MCG Caps capsule Commonly known as:  LINZESS Take 290 mcg by mouth daily before breakfast.   lubiprostone 24 MCG capsule Commonly known as:  AMITIZA Take 24 mcg by mouth 2 (two) times daily  as needed for constipation.   metFORMIN 500 MG tablet Commonly known as:  GLUCOPHAGE Take by mouth 2 (two) times daily with a meal.   mometasone-formoterol 100-5 MCG/ACT Aero Commonly known as:  DULERA Inhale 2 puffs into the lungs 2 (two) times daily.   ondansetron 4 MG tablet Commonly known as:  ZOFRAN Take 4 mg by mouth every 8 (eight) hours as needed for nausea or vomiting.   oxyCODONE-acetaminophen 10-325 MG tablet Commonly known as:  PERCOCET Take 1 tablet by mouth 4 (four) times daily.   OXYGEN Inhale 2.5 L into the lungs as needed (shortness of breath).   Vitamin D (Ergocalciferol) 1.25 MG (50000 UT) Caps capsule Commonly known as:  DRISDOL Take 50,000 Units by mouth every Wednesday.            Durable Medical Equipment  (From admission, onward)         Start  Ordered   02/11/19 0518  For home use only DME oxygen  Once    Question Answer Comment  Mode or (Route) Nasal cannula   Liters per Minute 3   Frequency Continuous (stationary and portable oxygen unit needed)   Oxygen conserving device Yes   Oxygen delivery system Gas      02/11/19 0517   02/11/19 0518  For home use only DME Pulse oximeter  Once     02/11/19 0517         Allergies  Allergen Reactions  . Buprenorphine Hcl Hives    Pt immediately c/o itching and hives after administration of 4mg  Morphine IV, required Benadryl IV for relief. Pt immediately c/o itching and hives after administration of 4mg  Morphine IV, required Benadryl IV for relief.   . Erythromycin Anaphylaxis and Nausea And Vomiting  . Morphine And Related Hives    Pt immediately c/o itching and hives after administration of 4mg  Morphine IV, required Benadryl IV for relief.  . Shellfish Allergy Anaphylaxis and Hives  . Iodine Hives and Rash    Patient reports not having anaphylaxis to iodine. That was entered in mistake previously.   Follow-up Information    Teresa Ginger, PA-C. Call in 1 day(s).   Specialty:   Cardiology Why:  Please call for a post hospital follow-up appointment. Contact information: [facility]  [address] [place] [address] [phone]            The results of significant diagnostics from this hospitalization (including imaging, microbiology, ancillary and laboratory) are listed below for reference.    Significant Diagnostic Studies: Dg Chest 2 View  Result Date: 02/08/2019 CLINICAL DATA:  Shortness of breath EXAM: CHEST - 2 VIEW COMPARISON:  12/06/2018 FINDINGS: Small bilateral pleural effusions, right greater than left airspace disease at the right base. Cardiomegaly with vascular congestion and mild edema. No pneumothorax. IMPRESSION: 1. Cardiomegaly with vascular congestion and pulmonary edema. 2. Small right greater than left pleural effusions. Right basilar atelectasis or pneumonia Electronically Signed   By: Donavan Foil M.D.   On: 02/08/2019 22:53   Ct Head Wo Contrast  Result Date: 02/09/2019 CLINICAL DATA:  AMS. Pt on Eliquis. Hx HTN and diabetes. EXAM: CT HEAD WITHOUT CONTRAST TECHNIQUE: Contiguous axial images were obtained from the base of the skull through the vertex without intravenous contrast. COMPARISON:  12/06/2018 FINDINGS: Brain: No evidence of acute infarction, hemorrhage, hydrocephalus, extra-axial collection or mass lesion/mass effect. Vascular: No hyperdense vessel or unexpected calcification. Skull: Normal. Negative for fracture or focal lesion. Sinuses/Orbits: Globes and orbits are unremarkable. The visualized sinuses and mastoid air cells are clear. Other: None. IMPRESSION: Normal unenhanced CT scan of the brain. Electronically Signed   By: Lajean Manes M.D.   On: 02/09/2019 01:31   Ct Chest Wo Contrast  Result Date: 02/10/2019 CLINICAL DATA:  Upper chest heaviness and shortness of breath EXAM: CT CHEST WITHOUT CONTRAST TECHNIQUE: Multidetector CT imaging of the chest was performed following the standard protocol without IV  contrast. COMPARISON:  12/19/2013 FINDINGS: Cardiovascular: Normal heart size. No pericardial effusion. No vascular findings. Mediastinum/Nodes: Moderate sliding hiatal hernia. Lungs/Pleura: Small pleural effusions and multi segment dependent atelectasis. Variable airway collapse, advanced in the right. Patchy air trapping at the apices. No generalized Kerley lines or air bronchogram. Subpleural fat expansion, chronic Upper Abdomen: No acute finding Musculoskeletal: No acute or aggressive finding. T11 and T12 hemangioma. IMPRESSION: 1. Multi segment atelectasis and small pleural effusions. 2. 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 3. Moderate hiatal hernia. Electronically Signed   By: Monte Fantasia M.D.   On: 02/10/2019 09:09    Microbiology: Recent Results (from the past 240 hour(s))  MRSA PCR Screening     Status: None   Collection Time: 02/09/19 11:00 AM  Result Value Ref Range Status   MRSA by PCR NEGATIVE NEGATIVE Final    Comment:        The GeneXpert MRSA Assay (FDA approved for NASAL specimens only), is one component of a comprehensive MRSA colonization surveillance program. It is not intended to diagnose MRSA infection nor to guide or monitor treatment for MRSA infections. Performed at [facility], [place] [address] [place], [place] [address]   Respiratory Panel by PCR     Status: None   Collection Time: 02/10/19  8:13 AM  Result Value Ref Range Status   Adenovirus NOT DETECTED NOT DETECTED Final   Coronavirus 229E NOT DETECTED NOT DETECTED Final    Comment: (NOTE) The Coronavirus on the Respiratory Panel, DOES NOT test for the novel  Coronavirus (2019 nCoV)    Coronavirus HKU1 NOT DETECTED NOT DETECTED Final   Coronavirus NL63 NOT DETECTED NOT DETECTED Final   Coronavirus OC43 NOT DETECTED NOT DETECTED Final   Metapneumovirus NOT DETECTED NOT DETECTED Final   Rhinovirus / Enterovirus NOT DETECTED NOT DETECTED Final    Influenza A NOT DETECTED NOT DETECTED Final   Influenza B NOT DETECTED NOT DETECTED Final   Parainfluenza Virus 1 NOT DETECTED NOT DETECTED Final   Parainfluenza Virus 2 NOT DETECTED NOT DETECTED Final   Parainfluenza Virus 3 NOT DETECTED NOT DETECTED Final   Parainfluenza Virus 4 NOT DETECTED NOT DETECTED Final   Respiratory Syncytial Virus NOT DETECTED NOT DETECTED Final   Bordetella pertussis NOT DETECTED NOT DETECTED Final   Chlamydophila pneumoniae NOT DETECTED NOT DETECTED Final   Mycoplasma pneumoniae NOT DETECTED NOT DETECTED Final    Comment: Performed at [facility], [place]. [address] [place], [place] [address]     Labs: Basic Metabolic Panel: Recent Labs  Lab 02/08/19 2242 02/08/19 2251 02/08/19 2312 02/10/19 0210 02/11/19 0509  NA 139 140 140 139 136  K 4.2 4.3 4.2 3.6 3.5  CL 102  --   --  100 100  CO2 30  --   --  32 28  GLUCOSE 107*  --   --  91 106*  BUN 16  --   --  12 14  CREATININE 1.22*  --   --  0.91 0.87  CALCIUM 8.6*  --   --  8.8* 9.2  MG  --   --   --  2.2  --    Liver Function Tests: Recent Labs  Lab 02/08/19 2242  AST 12*  ALT 8  ALKPHOS 81  BILITOT 0.9  PROT 7.5  ALBUMIN 3.3*   No results for input(s): LIPASE, AMYLASE in the last 168 hours. No results for input(s): AMMONIA in the last 168 hours. CBC: Recent Labs  Lab 02/08/19 2242 02/08/19 2251 02/08/19 2312  WBC 7.2  --   --   NEUTROABS 4.9  --   --   HGB 9.3* 10.9* 10.5*  HCT 34.9* 32.0* 31.0*  MCV 89.5  --   --   PLT 289  --   --    Cardiac Enzymes: No results for input(s): CKTOTAL, CKMB, CKMBINDEX, TROPONINI in the last 168 hours. BNP: BNP (last 3  results) Recent Labs    11/17/18 1052 12/06/18 1225 02/08/19 2242  BNP 19.0 293.4* 169.5*    ProBNP (last 3 results) No results for input(s): PROBNP in the last 8760 hours.  CBG: Recent Labs  Lab 02/10/19 0734 02/10/19 1147 02/10/19 1600 02/10/19 2152 02/11/19 0732  GLUCAP 102* 97 91 123* 100*        Signed:  Kayleen Memos, MD [facility] 02/11/2019, 8:58 AM

## 2019-02-11 NOTE — TOC Transition Note (Signed)
Transition of Care [facility]) - CM/SW Discharge Note   Patient Details  Name: Teresa Wiley MRN: [id number] Date of Birth: [birth year]  [facility] CM/SW Contact:  Zenon Mayo, RN Phone Number: 02/11/2019, 12:12 PM   Clinical Narrative:       Final next level of care: [place] Barriers to Discharge: No Barriers Identified   Patient Goals and CMS Choice Patient states their goals for this hospitalization and ongoing recovery are:: rest, get use to using oxygen continously CMS Medicare.gov Compare Post Acute Care list provided to:: Patient Choice offered to / list presented to : Patient  Discharge Placement                       Discharge Plan and Services   Discharge Planning Services: CM Consult Post Acute Care Choice: Durable Medical Equipment, Home Health          DME Arranged: Oxygen DME Agency: [facility] HH Arranged: RN, PT, Nurse's Aide [place] Agency: [facility] [employer]   Social Determinants of Health (SDOH) Interventions   Patient for dc today, NCM asked if she considered the [facility] services again, she states yes and she would like to work with [facility] , because she had them before she came to the hospital.  Referral made to [facility], she states she has to check with the office and get back with me.  Butch Penny states they can not take patient.  Also Tommi Rumps with [facility] said he could not take patient, and [facility] could not take referral.  NCM spoke with Tiffany for [facility] and she states they can take referral. Soc will begin 24-48 hrs post dc.  Patient will also has new oxygen orders with [facility], Brenton Grills will bring a tank to room for her to go home with.      Readmission Risk Interventions Readmission Risk Prevention Plan 02/11/2019 02/10/2019  Transportation Screening - Complete  PCP or Specialist Appt within 3-5 Days Complete -  HRI or Home Care Consult Complete Patient refused  Social Work Consult for  [place] Planning/Counseling Not Complete -  Palliative Care Screening - Not Applicable  Medication Review [employer]) - Complete  Some recent data might be hidden

## 2019-02-11 NOTE — Evaluation (Signed)
Physical Therapy Evaluation Patient Details Name: Teresa Wiley MRN: [id number] DOB: Mar 01, 1956 Today's Date: 02/11/2019   History of Present Illness  Teresa Wiley is a 63 y.o. female with medical history significant of HTN, pulmonary HTN, dCHF, DM, chronic anticoagulation on Eliquis 2/2 PE in 2017, chronic pain syndrome on Percocet QID, chronic home O2 requirement of 3L, stroke, GERD, anxiety, polypharmacy, who presents with shortness of breath and altered mental status.    Clinical Impression  Pt admitted with above diagnosis. Pt currently with functional limitations due to the deficits listed below (see PT Problem List). PTA, patient reports independence at home living with family members in story home. Today ambulating short distances with poor balance/strength/tolerance. Wold benefit from RA. Pt will benefit from skilled PT to increase their independence and safety with mobility to allow discharge to the venue listed below.       Follow Up Recommendations Home health PT;Supervision/Assistance - 24 hour    Equipment Recommendations  Rolling walker with 5" wheels    Recommendations for Other Services       Precautions / Restrictions Precautions Precautions: Fall Restrictions Weight Bearing Restrictions: No      Mobility  Bed Mobility Overal bed mobility: Modified Independent                Transfers Overall transfer level: Needs assistance Equipment used: None;1 person hand held assist Transfers: Sit to/from Stand              Ambulation/Gait Ambulation/Gait assistance: Min assist Gait Distance (Feet): 75 Feet Assistive device: None   Gait velocity: decreased   General Gait Details: patient desat to 83% on RA, LOB x1 requiring min A to stablilize. woudl benefit from O2 and RW during gait at this time  Financial trader Rankin (Stroke Patients Only)       Balance Overall balance assessment: Needs  assistance   Sitting balance-Leahy Scale: Fair       Standing balance-Leahy Scale: Poor                               Pertinent Vitals/Pain Pain Assessment: No/denies pain    Home Living Family/patient expects to be discharged to:: Private residence Living Arrangements: Spouse/significant other;Children Available Help at Discharge: Family;Available 24 hours/day Type of Home: Other(Comment) Home Access: Stairs to enter Entrance Stairs-Rails: Right;Left Entrance Stairs-Number of Steps: 3 Home Layout: Two level;Bed/bath upstairs;Other (Comment)   Additional Comments: had a cane but it broke, usually does not use AD for mobility, recent history of falls on stairs     Prior Function Level of Independence: Needs assistance   Gait / Transfers Assistance Needed: pt reports her son provides supervision for stiars, otherwise she reports she was walking without a device   ADL's / Homemaking Assistance Needed: mod independent         Hand Dominance        Extremity/Trunk Assessment   Upper Extremity Assessment Upper Extremity Assessment: Overall WFL for tasks assessed    Lower Extremity Assessment Lower Extremity Assessment: Overall WFL for tasks assessed       Communication      Cognition Arousal/Alertness: Awake/alert Behavior During Therapy: WFL for tasks assessed/performed Overall Cognitive Status: Within Functional Limits for tasks assessed      following cues conversational, however reports she wears no home O2 per chart 3L at baseline.  General Comments      Exercises     Assessment/Plan    PT Assessment    PT Problem List         PT Treatment Interventions      PT Goals (Current goals can be found in the Care Plan section)  Acute Rehab PT Goals Patient Stated Goal: go home when able PT Goal Formulation: With patient Time For Goal Achievement: 02/25/19 Potential to Achieve Goals:  Good    Frequency Min 3X/week   Barriers to discharge        Co-evaluation               AM-PAC PT "6 Clicks" Mobility  Outcome Measure Help needed turning from your back to your side while in a flat bed without using bedrails?: A Little Help needed moving from lying on your back to sitting on the side of a flat bed without using bedrails?: A Little Help needed moving to and from a bed to a chair (including a wheelchair)?: A Little Help needed standing up from a chair using your arms (e.g., wheelchair or bedside chair)?: A Little Help needed to walk in hospital room?: A Little Help needed climbing 3-5 steps with a railing? : A Little 6 Click Score: 18    End of Session Equipment Utilized During Treatment: Gait belt Activity Tolerance: Patient tolerated treatment well Patient left: in bed Nurse Communication: Mobility status PT Visit Diagnosis: Unsteadiness on feet (R26.81)    Time: 1533-1610 PT Time Calculation (min) (ACUTE ONLY): 37 min   Charges:   PT Evaluation $PT Eval Moderate Complexity: 1 Mod PT Treatments $Gait Training: 8-22 mins       Reinaldo Berber, PT, DPT Acute Rehabilitation Services Pager: [phone] Office: [phone]   Reinaldo Berber 02/11/2019, 4:56 PM

## 2019-02-11 NOTE — Discharge Instructions (Signed)
Acute Respiratory Failure, Adult  Acute respiratory failure occurs when there is not enough oxygen passing from your lungs to your body. When this happens, your lungs have trouble removing carbon dioxide from the blood. This causes your blood oxygen level to drop too low as carbon dioxide builds up. Acute respiratory failure is a medical emergency. It can develop quickly, but it is temporary if treated promptly. Your lung capacity, or how much air your lungs can hold, may improve with time, exercise, and treatment. What are the causes? There are many possible causes of acute respiratory failure, including:  Lung injury.  Chest injury or damage to the ribs or tissues near the lungs.  Lung conditions that affect the flow of air and blood into and out of the lungs, such as pneumonia, acute respiratory distress syndrome, and cystic fibrosis.  Medical conditions, such as strokes or spinal cord injuries, that affect the muscles and nerves that control breathing.  Blood infection (sepsis).  Inflammation of the pancreas (pancreatitis).  A blood clot in the lungs (pulmonary embolism).  A large-volume blood transfusion.  Burns.  Near-drowning.  Seizure.  Smoke inhalation.  Reaction to medicines.  Alcohol or drug overdose. What increases the risk? This condition is more likely to develop in people who have:  A blocked airway.  Asthma.  A condition or disease that damages or weakens the muscles, nerves, bones, or tissues that are involved in breathing.  A serious infection.  A health problem that blocks the unconscious reflex that is involved in breathing, such as hypothyroidism or sleep apnea.  A lung injury or trauma. What are the signs or symptoms? Trouble breathing is the main symptom of acute respiratory failure. Symptoms may also include:  Rapid breathing.  Restlessness or anxiety.  Skin, lips, or fingernails that appear blue (cyanosis).  Rapid heart  rate.  Abnormal heart rhythms (arrhythmias).  Confusion or changes in behavior.  Tiredness or loss of energy.  Feeling sleepy or having a loss of consciousness. How is this diagnosed? Your health care provider can diagnose acute respiratory failure with a medical history and physical exam. During the exam, your health care provider will listen to your heart and check for crackling or wheezing sounds in your lungs. Your may also have tests to confirm the diagnosis and determine what is causing respiratory failure. These tests may include:  Measuring the amount of oxygen in your blood (pulse oximetry). The measurement comes from a small device that is placed on your finger, earlobe, or toe.  Other blood tests to measure blood gases and to look for signs of infection.  Sampling your cerebral spinal fluid or tracheal fluid to check for infections.  Chest X-ray to look for fluid in spaces that should be filled with air.  Electrocardiogram (ECG) to look at the heart's electrical activity. How is this treated? Treatment for this condition usually takes places in a hospital intensive care unit (ICU). Treatment depends on what is causing the condition. It may include one or more treatments until your symptoms improve. Treatment may include:  Supplemental oxygen. Extra oxygen is given through a tube in the nose, a face mask, or a hood.  A device such as a continuous positive airway pressure (CPAP) or bi-level positive airway pressure (BiPAP or BPAP) machine. This treatment uses mild air pressure to keep the airways open. A mask or other device will be placed over your nose or mouth. A tube that is connected to a motor will deliver oxygen through  the mask.  Ventilator. This treatment helps move air into and out of the lungs. This may be done with a bag and mask or a machine. For this treatment, a tube is placed in your windpipe (trachea) so air and oxygen can flow to the lungs.  Extracorporeal  membrane oxygenation (ECMO). This treatment temporarily takes over the function of the heart and lungs, supplying oxygen and removing carbon dioxide. ECMO gives the lungs a chance to recover. It may be used if a ventilator is not effective.  Tracheostomy. This is a procedure that creates a hole in the neck to insert a breathing tube.  Receiving fluids and medicines.  Rocking the bed to help breathing. Follow these instructions at home:  Take over-the-counter and prescription medicines only as told by your health care provider.  Return to normal activities as told by your health care provider. Ask your health care provider what activities are safe for you.  Keep all follow-up visits as told by your health care provider. This is important. How is this prevented? Treating infections and medical conditions that may lead to acute respiratory failure can help prevent the condition from developing. Contact a health care provider if:  You have a fever.  Your symptoms do not improve or they get worse. Get help right away if:  You are having trouble breathing.  You lose consciousness.  Your have cyanosis or turn blue.  You develop a rapid heart rate.  You are confused. These symptoms may represent a serious problem that is an emergency. Do not wait to see if the symptoms will go away. Get medical help right away. Call your local emergency services (911 in the [place] Do not drive yourself to the hospital. This information is not intended to replace advice given to you by your health care provider. Make sure you discuss any questions you have with your health care provider. Document Released: 11/16/2013 Document Revised: 06/08/2016 Document Reviewed: 05/29/2016 Elsevier Interactive Patient Education  2019 [place].   Heart Failure Eating Plan Heart failure, also called congestive heart failure, occurs when your heart does not pump blood well enough to meet your body's needs for  oxygen-rich blood. Heart failure is a long-term (chronic) condition. Living with heart failure can be challenging. However, following your health care provider's instructions about a healthy lifestyle and working with a diet and nutrition specialist (dietitian) to choose the right foods may help to improve your symptoms. What are tips for following this plan? General guidelines  Do not eat more than 2,300 mg of salt (sodium) a day. The amount of sodium that is recommended for you may be lower, depending on your condition.  Maintain a healthy body weight as directed. Ask your health care provider what a healthy weight is for you. ? Check your weight every day. ? Work with your health care provider and dietitian to make a plan that is right for you to lose weight or maintain your current weight.  Limit how much fluid you drink. Ask your health care provider or dietitian how much fluid you can have each day.  Limit or avoid alcohol as told by your health care provider or dietitian. Reading food labels  Check food labels for the amount of sodium per serving. Choose foods that have less than 140 mg (milligrams) of sodium in each serving.  Check food labels for the number of calories per serving. This is important if you need to limit your daily calorie intake to lose weight.  Check food labels for the serving size. If you eat more than one serving, you will be eating more sodium and calories than what is listed on the label.  Look for foods that are labeled as "sodium-free," "very low sodium," or "low sodium." ? Foods labeled as "reduced sodium" or "lightly salted" may still have more sodium than what is recommended for you. Cooking  Avoid adding salt when cooking. Ask your health care provider or dietitian before using salt substitutes.  Season food with salt-free seasonings, spices, or herbs. Check the label of seasoning mixes to make sure they do not contain salt.  Cook with heart-healthy  oils, such as olive, canola, soybean, or sunflower oil.  Do not fry foods. Cook foods using low-fat methods, such as baking, boiling, grilling, and broiling.  Limit unhealthy fats when cooking by: ? Removing the skin from poultry, such as chicken. ? Removing all visible fats from meats. ? Skimming the fat off from stews, soups, and gravies before serving them. Meal planning   Limit your intake of: ? Processed, canned, or pre-packaged foods. ? Foods that are high in trans fat, such as fried foods. ? Sweets, desserts, sugary drinks, and other foods with added sugar. ? Full-fat dairy products, such as whole milk.  Eat a balanced diet that includes: ? 4-5 servings of fruit each day and 4-5 servings of vegetables each day. At each meal, try to fill half of your plate with fruits and vegetables. ? Up to 6-8 servings of whole grains each day. ? Up to 2 servings of lean meat, poultry, or fish each day. One serving of meat is equal to 3 oz. This is about the same size as a deck of cards. ? 2 servings of low-fat dairy each day. ? Heart-healthy fats. Healthy fats called omega-3 fatty acids are found in foods such as flaxseed and cold-water fish like sardines, salmon, and mackerel.  Aim to eat 25-35 g (grams) of fiber a day. Foods that are high in fiber include apples, broccoli, carrots, beans, peas, and whole grains.  Do not add salt or condiments that contain salt (such as soy sauce) to foods before eating.  When eating at a restaurant, ask that your food be prepared with less salt or no salt, if possible.  Try to eat 2 or more vegetarian meals each week.  Eat more home-cooked food and eat less restaurant, buffet, and fast food. Recommended foods The items listed may not be a complete list. Talk with your dietitian about what dietary choices are best for you. Grains Bread with less than 80 mg of sodium per slice. Whole-wheat pasta, quinoa, and brown rice. Oats and oatmeal. Barley. [place].  Grits and cream of wheat. Whole-grain and whole-wheat cold cereal. Vegetables All fresh vegetables. Vegetables that are frozen without sauce or added salt. Low-sodium or sodium-free canned vegetables. Fruits All fresh, frozen, and canned fruits. Dried fruits, such as raisins, prunes, and cranberries. Meats and other protein foods Lean cuts of meat. Skinless chicken and [place]. Fish with high omega-3 fatty acids, such as salmon, sardines, and other cold-water fishes. Eggs. Dried beans, peas, and edamame. Unsalted nuts and nut butters. Dairy Low-fat or nonfat (skim) milk and dried milk. Rice milk, soy milk, and almond milk. Low-fat or nonfat yogurt. Small amounts of reduced-sodium block cheese. Low-sodium cottage cheese. Fats and oils Olive, canola, soybean, flaxseed, or sunflower oil. Avocado. Sweets and desserts Apple sauce. Granola bars. Sugar-free pudding and gelatin. Frozen fruit bars. Seasoning and other foods  Fresh and dried herbs. Lemon or lime juice. Vinegar. Low-sodium ketchup. Salt-free marinades, salad dressings, sauces, and seasonings. Foods to avoid The items listed may not be a complete list. Talk with your dietitian about what dietary choices are best for you. Grains Bread with more than 80 mg of sodium per slice. Hot or cold cereal with more than 140 mg sodium per serving. Salted pretzels and crackers. Pre-packaged breadcrumbs. Bagels, croissants, and biscuits. Vegetables Canned vegetables. Frozen vegetables with sauce or seasonings. Creamed vegetables. [place] fries. Onion rings. Pickled vegetables and sauerkraut. Fruits Fruits that are dried with sodium-containing preservatives. Meats and other protein foods Ribs and chicken wings. Bacon, ham, pepperoni, bologna, salami, and packaged luncheon meats. Hot dogs, bratwurst, and sausage. Canned meat. Smoked meat and fish. Salted nuts and seeds. Dairy Whole milk, half-and-half, and cream. Buttermilk. Processed cheese, cheese  spreads, and cheese curds. Regular cottage cheese. Feta cheese. Shredded cheese. String cheese. Fats and oils Butter, lard, shortening, ghee, and bacon fat. Canned and packaged gravies. Seasoning and other foods Onion salt, garlic salt, table salt, and sea salt. Marinades. Regular salad dressings. Relishes, pickles, and olives. Meat flavorings and tenderizers, and bouillon cubes. Horseradish, ketchup, and mustard. Worcestershire sauce. Teriyaki sauce, soy sauce (including reduced sodium). Hot sauce and Tabasco sauce. Steak sauce, fish sauce, oyster sauce, and cocktail sauce. Taco seasonings. Barbecue sauce. Tartar sauce. Summary  A heart failure eating plan includes changes that limit your intake of sodium and unhealthy fat, and it may help you lose weight or maintain a healthy weight. Your health care provider may also recommend limiting how much fluid you drink.  Most people with heart failure should eat no more than 2,300 mg of salt (sodium) a day. The amount of sodium that is recommended for you may be lower, depending on your condition.  Contact your health care provider or dietitian before making any major changes to your diet. This information is not intended to replace advice given to you by your health care provider. Make sure you discuss any questions you have with your health care provider. Document Released: 03/28/2017 Document Revised: 03/28/2017 Document Reviewed: 03/28/2017 Elsevier Interactive Patient Education  2019 [place].   Heart Failure  Heart failure means your heart has trouble pumping blood. This makes it hard for your body to work well. Heart failure is usually a long-term (chronic) condition. You must take good care of yourself and follow your treatment plan from your doctor. Follow these instructions at home: Medicines  Take over-the-counter and prescription medicines only as told by your doctor. ? Do not stop taking your medicine unless your doctor told you  to do that. ? Do not skip any doses. ? Refill your prescriptions before you run out of medicine. You need your medicines every day. Eating and drinking   Eat heart-healthy foods. Talk with a diet and nutrition specialist (dietitian) to make an eating plan.  Choose foods that: ? Have no trans fat. ? Are low in saturated fat and cholesterol.  Choose healthy foods, like: ? Fresh or frozen fruits and vegetables. ? Fish. ? Low-fat (lean) meats. ? Legumes (like beans, peas, and lentils). ? Fat-free or low-fat dairy products. ? Whole-grain foods. ? High-fiber foods.  Limit salt (sodium) if told by your doctor. Ask your nutrition specialist to recommend heart-healthy seasonings.  Cook in healthy ways instead of frying. Healthy ways of cooking include: ? Roasting. ? Grilling. ? Broiling. ? Baking. ? Poaching. ? Steaming. ? Stir-frying.  Limit how much fluid  you drink, if told by your doctor. Lifestyle  Do not smoke or use chewing tobacco. Do not use nicotine gum or patches before talking to your doctor.  Limit alcohol intake to no more than 1 drink a day for non-pregnant women and 2 drinks a day for men. One drink equals 12 oz of beer, 5 oz of wine, or 1 oz of hard liquor. ? Tell your doctor if you drink alcohol many times a week. ? Talk with your doctor about whether any alcohol is safe for you. ? You should stop drinking alcohol: ? If your heart has been damaged by alcohol. ? You have very bad heart failure.  Do not use illegal drugs.  Lose weight if told by your doctor.  Do moderate physical activity if told by your doctor. Ask your doctor what activities are safe for you if: ? You are of older age (elderly). ? You have very bad heart failure. Keep track of important information  Weigh yourself every day. ? Weigh yourself every morning after you pee (urinate) and before breakfast. ? Wear the same amount of clothing each time. ? Write down your daily weight. Give  your record to your doctor.  Check and write down your blood pressure as told by your doctor.  Check your pulse as told by your doctor. Dealing with heat and cold  If the weather is very hot: ? Avoid activity that takes a lot of energy. ? Use air conditioning or fans, or find a cooler place. ? Avoid caffeine. ? Avoid alcohol. ? Wear clothing that is loose-fitting, lightweight, and light-colored.  If the weather is very cold: ? Avoid activity that takes a lot of energy. ? Layer your clothes. ? Wear mittens or gloves, a hat, and a scarf when you go outside. ? Avoid alcohol. General instructions  Manage other conditions that you have as told by your doctor.  Learn to manage stress. If you need help, ask your doctor.  Plan rest periods for when you get tired.  Get education and support as needed.  Get rehab (rehabilitation) to help you stay independent and to help with everyday tasks.  Stay up to date with shots (immunizations), especially pneumococcal and flu (influenza) shots.  Keep all follow-up visits as told by your doctor. This is important. Contact a doctor if:  You gain weight quickly.  You are more short of breath than normal.  You cannot do your normal activities.  You tire easily.  You cough more than normal, especially with activity.  You have any or more puffiness (swelling) in areas such as your hands, feet, ankles, or belly (abdomen).  You cannot sleep because it is hard to breathe.  You feel like your heart is beating fast (palpitations).  You get dizzy or light-headed when you stand up. Get help right away if:  You have trouble breathing.  You or someone else notices a change in your awareness. This could be trouble staying awake or trouble concentrating.  You have chest pain or discomfort.  You pass out (faint). Summary  Heart failure means your heart has trouble pumping blood.  Make sure you refill your prescriptions before you run out  of medicine. You need your medicines every day.  Keep records of your weight and blood pressure to give to your doctor.  Contact a doctor if you gain weight quickly. This information is not intended to replace advice given to you by your health care provider. Make sure you discuss any  questions you have with your health care provider. Document Released: 08/20/2008 Document Revised: 08/05/2018 Document Reviewed: 12/03/2016 Elsevier Interactive Patient Education  2019 [place].   Heart Failure Action Plan A heart failure action plan helps you understand what to do when you have symptoms of heart failure. Follow the plan that was created by you and your health care provider. Review your plan each time you visit your health care provider. Red zone These signs and symptoms mean you should get medical help right away:  You have trouble breathing when resting.  You have a dry cough that is getting worse.  You have swelling or pain in your legs or abdomen that is getting worse.  You suddenly gain more than 2-3 lb (0.9-1.4 kg) in a day, or more than 5 lb (2.3 kg) in one week. This amount may be more or less depending on your condition.  You have trouble staying awake or you feel confused.  You have chest pain.  You do not have an appetite.  You pass out. If you experience any of these symptoms:  Call your local emergency services (911 in the [place] right away or seek help at the emergency department of the nearest hospital. Yellow zone These signs and symptoms mean your condition may be getting worse and you should make some changes:  You have trouble breathing when you are active or you need to sleep with extra pillows.  You have swelling in your legs or abdomen.  You gain 2-3 lb (0.9-1.4 kg) in one day, or 5 lb (2.3 kg) in one week. This amount may be more or less depending on your condition.  You get tired easily.  You have trouble sleeping.  You have a dry cough. If you  experience any of these symptoms:  Contact your health care provider within the next day.  Your health care provider may adjust your medicines. Green zone These signs mean you are doing well and can continue what you are doing:  You do not have shortness of breath.  You have very little swelling or no new swelling.  Your weight is stable (no gain or loss).  You have a normal activity level.  You do not have chest pain or any other new symptoms. Follow these instructions at home:  Take over-the-counter and prescription medicines only as told by your health care provider.  Weigh yourself daily. Your target weight is __________ lb (__________ kg). ? Call your health care provider if you gain more than __________ lb (__________ kg) in a day, or more than __________ lb (__________ kg) in one week.  Eat a heart-healthy diet. Work with a diet and nutrition specialist (dietitian) to create an eating plan that is best for you.  Keep all follow-up visits as told by your health care provider. This is important. Where to find more information  American Heart Association: www.heart.org Summary  Follow the action plan that was created by you and your health care provider.  Get help right away if you have any symptoms in the Red zone. This information is not intended to replace advice given to you by your health care provider. Make sure you discuss any questions you have with your health care provider. Document Released: 12/21/2016 Document Revised: 07/16/2017 Document Reviewed: 12/21/2016 Elsevier Interactive Patient Education  2019 [employer]   Heart Failure Exacerbation  Heart failure is a condition in which the heart does not fill up with enough blood, and therefore does not pump enough blood  and oxygen to the body. When this happens, parts of the body do not get the blood and oxygen they need to function properly. This can cause symptoms such as breathing problems, fatigue,  swelling, and confusion. Heart failure exacerbation refers to heart failure symptoms that get worse. The symptoms may get worse suddenly or develop slowly over time. Heart failure exacerbation is a serious medical problem that should be treated right away. What are the causes? A heart failure exacerbation can be triggered by:  Not taking your heart failure medicines correctly.  Infections.  Eating an unhealthy diet or a diet that is high in salt (sodium).  Drinking too much fluid.  Drinking alcohol.  Taking illegal drugs, such as cocaine or methamphetamine.  Not exercising. Other causes include:  Other heart conditions such as an irregular heartbeat (arrhythmia).  Anemia.  Other medical problems, such as kidney failure. Sometimes the cause of the exacerbation is not known. What are the signs or symptoms? When heart failure symptoms suddenly or slowly get worse, this may be a sign of heart failure exacerbation. Symptoms of heart failure include:  Breathing problems or shortness of breath.  Chronic coughing or wheezing.  Fatigue.  Nausea or lack of appetite.  Feeling light-headed.  Confusion or memory loss.  Increased heart rate or irregular heartbeat.  Buildup of fluid in the legs, ankles, feet, or abdomen.  Difficulty breathing when lying down. How is this diagnosed? This condition is diagnosed based on:  Your symptoms and medical history.  A physical exam. You may also have tests, including:  Electrocardiogram (ECG). This test measures the electrical activity of your heart.  Echocardiogram. This test uses sound waves to take a picture of your heart to see how well it works.  Blood tests.  Imaging tests, such as: ? Chest X-ray. ? MRI. ? Ultrasound.  Stress test. This test examines how well your heart functions when you exercise. Your heart is monitored while you exercise on a treadmill or exercise bike. If you cannot exercise, medicines may be used to  increase your heartbeat in place of exercise.  Cardiac catheterization. During this test, a thin, flexible tube (catheter) is inserted into a blood vessel and threaded up to your heart. This test allows your health care provider to check the arteries that lead to your heart (coronary arteries).  Right heart catheterization. During this test, the pressure in your heart is measured. How is this treated? This condition may be treated by:  Adjusting your heart medicines.  Maintaining a healthy lifestyle. This includes: ? Eating a heart-healthy diet that is low in sodium. ? Not using any products that contain nicotine or tobacco, such as cigarettes and e-cigarettes. ? Regular exercise. ? Monitoring your fluid intake. ? Monitoring your weight and reporting changes to your health care provider.  Treating sleep apnea, if you have this condition.  Surgery. This may include: ? Implanting a device that helps both sides of your heart contract at the same time (cardiac resynchronization therapy device). This can help with heart function and relieve heart failure symptoms. ? Implanting a device that can correct heart rhythm problems (implantable cardioverter defibrillator). ? Connecting a device to your heart to help it pump blood (ventricular assist device). ? Heart transplant. Follow these instructions at home: Medicines  Take over-the-counter and prescription medicines only as told by your health care provider.  Do not stop taking your medicines or change the amount you take. If you are having problems or side effects from your  medicines, talk to your health care provider.  If you are having difficulty paying for your medicines, contact a social worker or your clinic. There are many programs to assist with medicine costs.  Talk to your health care provider before starting any new medicines or supplements.  Make sure your health care provider and pharmacist have a list of all the medicines you  are taking. Eating and drinking   Avoid drinking alcohol.  Eat a heart-healthy diet as told by your health care provider. This includes: ? Plenty of fruits and vegetables. ? Lean proteins. ? Low-fat dairy. ? Whole grains. ? Foods that are low in sodium. Activity   Exercise regularly as told by your health care provider. Balance exercise with rest.  Ask your health care provider what activities are safe for you. This includes sexual activity, exercise, and daily tasks at home or work. Lifestyle  Do not use any products that contain nicotine or tobacco, such as cigarettes and e-cigarettes. If you need help quitting, ask your health care provider.  Maintain a healthy weight. Ask your health care provider what weight is healthy for you.  Consider joining a patient support group. This can help with emotional problems you may have, such as stress and anxiety. General instructions  Talk to your health care provider about flu and pneumonia vaccines.  Keep a list of medicines that you are taking. This may help in emergency situations.  Keep all follow-up visits as told by your health care provider. This is important. Contact a health care provider if:  You have questions about your medicines or you miss a dose.  You feel anxious, depressed, or stressed.  You have swelling in your feet, ankles, legs, or abdomen.  You have shortness of breath during activity or exercise.  You have a cough.  You have a fever.  You have trouble sleeping.  You gain 2-3 lb (1-1.4 kg) in 24 hours or 5 lb (2.3 kg) in a week. Get help right away if:  You have chest pain.  You have shortness of breath while resting.  You have severe fatigue.  You are confused.  You have severe dizziness.  You have a rapid or irregular heartbeat.  You have nausea or you vomit.  You have a cough that is worse at night or you cannot lie flat.  You have a cough that will not go away.  You have severe  depression or sadness. Summary  When heart failure symptoms get worse, it is called heart failure exacerbation.  Common causes of this condition include taking medicines incorrectly, infections, and drinking alcohol.  This condition may be treated by adjusting medicines, maintaining a healthy lifestyle, or surgery.  Do not stop taking your medicines or change the amount you take. If you are having problems or side effects from your medicines, talk to your health care provider. This information is not intended to replace advice given to you by your health care provider. Make sure you discuss any questions you have with your health care provider. Document Released: 03/25/2017 Document Revised: 03/25/2017 Document Reviewed: 03/25/2017 Elsevier Interactive Patient Education  2019 Elsevier Inc.   Preventing Heart Failure Heart failure is a condition in which the heart has trouble pumping blood. This may mean that the heart cannot pump enough blood out to the body, or that the heart does not fill up with enough blood. Either of those problems can lead to symptoms such as fatigue, trouble breathing, and swelling throughout the body. This  is a common medical condition that affects not only the heart, but the entire body. Making certain nutrition and lifestyle changes can help you prevent heart failure and avoid serious health problems. What nutrition changes can be made?   If you are overweight or obese, reduce how many calories you eat each day so that you lose weight. Work with your health care provider or a diet and nutrition specialist (dietitian) to determine how many calories you need each day.  Eat foods that are low in salt (sodium). Avoid adding extra salt to foods.  Eat a well-balanced diet that includes a lot of: ? Fresh fruits and vegetables. ? Whole grains. ? Lean meats. ? Beans. ? Fat-free or low-fat dairy products.  Avoid foods that contain a lot of: ? Trans  fats. ? Saturated fats. ? Sugar. ? Cholesterol. What lifestyle changes can be made?   Do not use any products that contain nicotine or tobacco, such as cigarettes and e-cigarettes. If you need help quitting or reducing how much you smoke, ask your health care provider.  Stop using alcohol, or limit alcohol intake to no more than 1 drink a day for nonpregnant women and 2 drinks a day for men. One drink equals 12 oz of beer, 5 oz of wine, or 1 oz of hard liquor.  Exercise for at least 150 minutes each week, or as much as told by your health care provider. ? Do moderate-intensity exercise, such as brisk walking, bicycling, or water aerobics. ? Ask your health care provider which activities are safe for you.  See a health care provider regularly for screening and wellness checks. Know your heart health indicators, such as: ? Blood pressure. ? Cholesterol levels. ? Blood sugar (glucose) levels. ? Weight and BMI.  If you have diabetes, manage your condition and follow your treatment plan as instructed.  Try to get 7-9 hours of sleep each night. To help with sleep: ? Keep your bedroom cool and dark. ? Do not eat a heavy meal during the hour before you go to bed. ? Do not drink alcohol or caffeinated drinks before bed. ? Avoid screen time before bedtime. This means avoiding television, computers, tablets, and cell phones.  Find ways to relax and manage stress. These may include: ? Breathing exercises. ? Meditation. ? Yoga. ? Listening to music. Why are these changes important?  A well-balanced diet with the appropriate amount of calories can keep your body weight at a healthy level, which reduces strain on your heart.  A low-sodium diet can help keep your blood pressure in a normal range and keep your blood vessels working properly.  Quitting smoking and limiting alcohol intake can reduce harmful effects that these substances have on your heart and blood vessels.  Regular exercise  can keep your heart strong so it can pump blood normally.  Managing diabetes helps your blood circulate and can help you maintain a healthy weight.  Managing stress helps to reduce the risk of high blood pressure and heart problems. What can happen if changes are not made? Heart failure can cause very serious problems that may get worse over time, such as:  Extreme fatigue during normal physical activities.  Shortness of breath or trouble breathing.  Swelling in your abdomen, legs, ankles, feet, or neck.  Fluid buildup throughout the body.  Weight gain.  Cough.  Frequent urination. What can I do to lower my risk? You may be able to lower your risk of heart failure by:  Losing weight or keeping your weight under control.  Working with your health care provider to manage your: ? Cholesterol. ? Blood pressure. ? Diabetes, if this applies.  Eating a healthy diet.  Exercising regularly.  Avoiding unhealthy habits, such as smoking, drinking, or using drugs.  Getting plenty of sleep.  Managing your stress. How is this treated? Heart failure cannot be cured except by heart transplant, but treatment can help to improve your quality of life. Treatment may include:  Medicines to help: ? Lower blood pressure. ? Remove excess sodium from your body. ? Relax blood vessels. ? Improve heart function. ? Control other symptoms of heart failure.  Surgery to open blocked coronary arteries or repair damaged heart valves.  Implantation of a biventricular pacemaker to improve heart muscle function (cardiac resynchronization therapy). This device paces both the right ventricle and left ventricle.  Implantation of a device to treat serious abnormal heart rhythms (implantable cardioverter defibrillator, ICD).  Implantation of a mechanical heart pump to improve the pumping ability of your heart (left ventricular assist device, LVAD).  Heart transplant. This treatment is considered for  certain people who do not improve with other treatments. Where to find more information  National Heart, Lung, and Blood Institute: ClickDebate.gl  Centers for Disease Control and Prevention: LawyerNetworking.com.cy  NI[facility] Senior [facility]: https://www.[place]-brown.info/  American Heart Association: ReligiousCamps.at.jsp Contact a health care provider if:  You have rapid weight gain.  You have increasing shortness of breath that is unusual for you.  You tire easily, or you are unable to participate in your usual activities.  You cough more than normal, especially with physical activity.  You have any swelling or more swelling in areas such as your hands, feet, ankles, or abdomen. Summary  Heart failure can be prevented by making changes to your diet and your lifestyle.  It is important to eat a healthy diet, manage your weight, exercise regularly, manage stress, avoid drugs and alcohol, and keep your cholesterol and blood pressure under control.  Heart failure can cause very serious problems over time. This information is not intended to replace advice given to you by your health care provider. Make sure you discuss any questions you have with your health care provider. Document Released: 07/02/2016 Document Revised: 01/28/2017 Document Reviewed: 07/02/2016 Elsevier Interactive Patient Education  2019 [place] on my medicine - ELIQUIS (apixaban)  This medication education was reviewed with me or my healthcare representative as part of my discharge preparation.  The pharmacist that spoke with me during my hospital stay was:  Onnie Boer, RPH-CPP  Why was Eliquis prescribed for you? Eliquis was prescribed to treat blood clots that may have been found in the veins of your legs (deep vein thrombosis)  or in your lungs (pulmonary embolism) and to reduce the risk of them occurring again.  What do You need to know about Eliquis ? Continue apixaban 5 mg tablet taken TWICE daily.  Eliquis may be taken with or without food.   Try to take the dose about the same time in the morning and in the evening. If you have difficulty swallowing the tablet whole please discuss with your pharmacist how to take the medication safely.  Take Eliquis exactly as prescribed and DO NOT stop taking Eliquis without talking to the doctor who prescribed the medication.  Stopping may increase your risk of developing a new blood clot.  Refill your prescription before you run out.  After discharge, you should have regular check-up appointments with your  healthcare provider that is prescribing your Eliquis.    What do you do if you miss a dose? If a dose of ELIQUIS is not taken at the scheduled time, take it as soon as possible on the same day and twice-daily administration should be resumed. The dose should not be doubled to make up for a missed dose.  Important Safety Information A possible side effect of Eliquis is bleeding. You should call your healthcare provider right away if you experience any of the following: ? Bleeding from an injury or your nose that does not stop. ? Unusual colored urine (red or dark brown) or unusual colored stools (red or black). ? Unusual bruising for unknown reasons. ? A serious fall or if you hit your head (even if there is no bleeding).  Some medicines may interact with Eliquis and might increase your risk of bleeding or clotting while on Eliquis. To help avoid this, consult your healthcare provider or pharmacist prior to using any new prescription or non-prescription medications, including herbals, vitamins, non-steroidal anti-inflammatory drugs (NSAIDs) and supplements.  This website has more information on Eliquis (apixaban): http://www.eliquis.com/eliquis/home

## 2019-02-11 NOTE — Progress Notes (Signed)
Physical Therapy Treatment Patient Details Name: Teresa Wiley MRN: [id number] DOB: January 05, 1956 Today's Date: 02/11/2019    History of Present Illness Teresa Wiley is a 63 y.o. female with medical history significant of HTN, pulmonary HTN, dCHF, DM, chronic anticoagulation on Eliquis 2/2 PE in 2017, chronic pain syndrome on Percocet QID, chronic home O2 requirement of 3L, stroke, GERD, anxiety, polypharmacy, who presents with shortness of breath and altered mental status.    PT Comments    Patient ambulating on 3L with SpO2 >94% however after 30' become dizzy with vision becoming distorted and required premature sit onto chair.  BP 136/78, HR 95. Discussed with DO and RN. Patient will need competent assistance at home to prevent future falls, or need to consider sub acute rehab.     Follow Up Recommendations  Home health PT;Supervision/Assistance - 24 hour     Equipment Recommendations  Rolling walker with 5" wheels    Recommendations for Other Services       Precautions / Restrictions Precautions Precautions: Fall Restrictions Weight Bearing Restrictions: No    Mobility  Bed Mobility Overal bed mobility: Modified Independent                Transfers Overall transfer level: Needs assistance Equipment used: None;1 person hand held assist Transfers: Sit to/from Stand              Ambulation/Gait Ambulation/Gait assistance: Min assist Gait Distance (Feet): 70 Feet(35 35') Assistive device: None   Gait velocity: decreased   General Gait Details: patient near fall premature sit into chair with c.o of vision distortion and feeling dizzy. VSS on 3L throughout visit. discussed with RN and MD feel medication related.    Stairs             Wheelchair Mobility    Modified Rankin (Stroke Patients Only)       Balance Overall balance assessment: Needs assistance   Sitting balance-Leahy Scale: Fair       Standing balance-Leahy Scale: Poor                               Cognition Arousal/Alertness: Awake/alert Behavior During Therapy: WFL for tasks assessed/performed Overall Cognitive Status: Within Functional Limits for tasks assessed                                        Exercises      General Comments        Pertinent Vitals/Pain Pain Assessment: No/denies pain    Home Living                      Prior Function            PT Goals (current goals can now be found in the care plan section) Acute Rehab PT Goals Patient Stated Goal: go home when able PT Goal Formulation: With patient Time For Goal Achievement: 02/25/19 Potential to Achieve Goals: Good Progress towards PT goals: Progressing toward goals    Frequency    Min 3X/week      PT Plan Current plan remains appropriate    Co-evaluation              AM-PAC PT "6 Clicks" Mobility   Outcome Measure  Help needed turning from your back to your side while in a flat bed without  using bedrails?: A Little Help needed moving from lying on your back to sitting on the side of a flat bed without using bedrails?: A Little Help needed moving to and from a bed to a chair (including a wheelchair)?: A Little Help needed standing up from a chair using your arms (e.g., wheelchair or bedside chair)?: A Little Help needed to walk in hospital room?: A Little Help needed climbing 3-5 steps with a railing? : A Little 6 Click Score: 18    End of Session Equipment Utilized During Treatment: Gait belt Activity Tolerance: Patient tolerated treatment well Patient left: in bed Nurse Communication: Mobility status PT Visit Diagnosis: Unsteadiness on feet (R26.81)     Time: 1830-1900 PT Time Calculation (min) (ACUTE ONLY): 30 min  Charges:  $Gait Training: 8-22 mins $Therapeutic Activity: 8-22 mins                     Reinaldo Berber, PT, DPT Acute Rehabilitation Services Pager: [phone] Office:  [phone]     Reinaldo Berber 02/11/2019, 7:11 PM

## 2019-02-11 NOTE — TOC Transition Note (Addendum)
Transition of Care [facility] - CM/SW Discharge Note   Patient Details  Name: Teresa Wiley MRN: [id number] Date of Birth: Jul 02, 1956  Transition [facility] CM/SW Contact:  Zenon Mayo, RN Phone Number: 02/11/2019, 9:23 AM   Clinical Narrative:    Patient for dc today, NCM asked if she considered the [facility] services again, she states yes and she would like to work with [facility] , because she had them before she came to the hospital.  Referral made to [facility] with [facility], she states she has to check with the office and get back with me.  Butch Penny states they can not take patient.  Also Tommi Rumps with [facility] said he could not take patient, and [facility] could not take referral.  NCM spoke with Tiffany for [facility] and she states they can take referral. Soc will begin 24-48 hrs post dc.  Patient will also need home oxygen, Jermaine with  [employer] will bring to patient's room.  Her wife and her grandson will be with her 24 hrs.  The oxgen tank is in her room.    Final next level of care: [place] Barriers to Discharge: No Barriers Identified   Patient Goals and CMS Choice Patient states their goals for this hospitalization and ongoing recovery are:: rest, get use to using oxygen continously CMS Medicare.gov Compare Post Acute Care list provided to:: Patient Choice offered to / list presented to : Patient  Discharge Placement                       Discharge Plan and Services Discharge Planning Services: CM Consult Post Acute Care Choice: Home Health              HH Arranged: RN, PT, Nurse's Aide [place] Agency: [facility]    Social Determinants of Health (SDOH) Interventions     Readmission Risk Interventions Readmission Risk Prevention Plan 02/10/2019  Transportation Screening Complete  HRI or Home Care Consult Patient refused  Palliative Care Screening Not Applicable  Medication Review (RN Care Manager) Complete  Some recent data might be hidden

## 2019-02-11 NOTE — Progress Notes (Signed)
SATURATION QUALIFICATIONS: (This note is used to comply with regulatory documentation for home oxygen)  Patient Saturations on Room Air at Rest = 91%  Patient Saturations on Room Air while Ambulating = 83%  Patient Saturations on 6 Liters of oxygen while Ambulating = 93%  Please briefly explain why patient needs home oxygen:  While pt ambulated. RN increased pt's O2 by 1L until she stayed at or above 90% consistently. She required 6L to be therapeutic. Pt reported being fatigued and slightly dizzy at end of walk. Pt's O2 improved once rested (96% on 6L).

## 2019-02-12 LAB — GLUCOSE, CAPILLARY: Glucose-Capillary: 133 mg/dL — ABNORMAL HIGH (ref 70–99)

## 2019-02-12 MED ORDER — OXYCODONE-ACETAMINOPHEN 5-325 MG PO TABS: 1.0000 | ORAL_TABLET | Freq: Two times a day (BID) | ORAL | 0 refills | Status: DC | PRN

## 2019-02-12 MED ORDER — DIAZEPAM 2 MG PO TABS: 2.0000 mg | ORAL_TABLET | Freq: Two times a day (BID) | ORAL | 0 refills | Status: DC | PRN

## 2019-02-12 MED ORDER — GABAPENTIN 100 MG PO CAPS: 100.0000 mg | ORAL_CAPSULE | Freq: Three times a day (TID) | ORAL | 0 refills | Status: DC

## 2019-02-12 MED ORDER — INFLUENZA VAC SPLIT QUAD 0.5 ML IM SUSY: 0.5000 mL | PREFILLED_SYRINGE | INTRAMUSCULAR | Status: DC

## 2019-02-12 NOTE — Progress Notes (Signed)
Physical Therapy Treatment Patient Details Name: Teresa Wiley MRN: [id number] DOB: 1956/04/24 Today's Date: 02/12/2019    History of Present Illness Teresa Wiley is a 63 y.o. female with medical history significant of HTN, pulmonary HTN, dCHF, DM, chronic anticoagulation on Eliquis 2/2 PE in 2017, chronic pain syndrome on Percocet QID, chronic home O2 requirement of 3L, stroke, GERD, anxiety, polypharmacy, who presents with shortness of breath and altered mental status.    PT Comments    Patient seen again to perform stair training and training with rollator. Patient eager to return home with poor attention and cary over of safety cues during visit. Affirms wife wll be home but unable to reach her by phone or her other family members. PT recommendation is HHPT only if there are competent assistance at home due to high fall risk, if not SNF.     Follow Up Recommendations  Home health PT;Supervision/Assistance - 24 hour     Equipment Recommendations  (rollator)    Recommendations for Other Services       Precautions / Restrictions Precautions Precautions: Fall Restrictions Weight Bearing Restrictions: No    Mobility  Bed Mobility Overal bed mobility: Modified Independent                Transfers Overall transfer level: Needs assistance Equipment used: None;1 person hand held assist Transfers: Sit to/from Stand Sit to Stand: Min guard            Ambulation/Gait Ambulation/Gait assistance: Min assist;Min guard Gait Distance (Feet): 50 Feet Assistive device: None Gait Pattern/deviations: Step-to pattern;Step-through pattern Gait velocity: decreased   General Gait Details: pt walking with imporved tolerance today on 6L, VSS. no vision delays or neeed for rest break. balance decreases as she moblilizes   Stairs Stairs: Yes Stairs assistance: Min guard Stair Management: One rail Right Number of Stairs: 6 General stair comments: alternating pattern, 1  rail use. DOE 2/4 end of stairs, VSS on 6L. educated on aspects of safety and improving technique    Wheelchair Mobility    Modified Rankin (Stroke Patients Only)       Balance Overall balance assessment: Needs assistance   Sitting balance-Leahy Scale: Fair       Standing balance-Leahy Scale: Poor                              Cognition Arousal/Alertness: Awake/alert Behavior During Therapy: WFL for tasks assessed/performed Overall Cognitive Status: Within Functional Limits for tasks assessed                                        Exercises      General Comments        Pertinent Vitals/Pain Pain Assessment: No/denies pain    Home Living                      Prior Function            PT Goals (current goals can now be found in the care plan section) Acute Rehab PT Goals Patient Stated Goal: go home when able PT Goal Formulation: With patient Time For Goal Achievement: 02/25/19 Potential to Achieve Goals: Good Progress towards PT goals: Progressing toward goals    Frequency    Min 3X/week      PT Plan Current plan remains appropriate  Co-evaluation              AM-PAC PT "6 Clicks" Mobility   Outcome Measure  Help needed turning from your back to your side while in a flat bed without using bedrails?: A Little Help needed moving from lying on your back to sitting on the side of a flat bed without using bedrails?: A Little Help needed moving to and from a bed to a chair (including a wheelchair)?: A Little Help needed standing up from a chair using your arms (e.g., wheelchair or bedside chair)?: A Little Help needed to walk in hospital room?: A Little Help needed climbing 3-5 steps with a railing? : A Little 6 Click Score: 18    End of Session Equipment Utilized During Treatment: Gait belt Activity Tolerance: Patient tolerated treatment well Patient left: in bed Nurse Communication: Mobility  status PT Visit Diagnosis: Unsteadiness on feet (R26.81)     Time: 1000-1025 PT Time Calculation (min) (ACUTE ONLY): 25 min  Charges:  $Gait Training: 8-22 mins $Therapeutic Activity: 8-22 mins                     Reinaldo Berber, PT, DPT Acute Rehabilitation Services Pager: [phone] Office: [phone]     Reinaldo Berber 02/12/2019, 11:13 AM

## 2019-02-12 NOTE — Discharge Summary (Addendum)
Discharge Summary  Teresa Wiley QBH:[id number] DOB: [birth year]  PCP: Secundino Ginger, PA-C  Admit date: 02/08/2019 Discharge date: 02/12/2019  Time spent: 35 minutes  Discharge delayed on 02/11/19 due to failed home O2 evaluation with hypoxia requiring 6L to maintain O2 sat above 90%.  Recommendations for Outpatient Follow-up:  1. Follow-up with your primary care physician for adjustment of your medications due to polypharmacy 2. Follow-up with your cardiologist 3. Take your medications as prescribed  Discharge Diagnoses:  Active Hospital Problems   Diagnosis Date Noted  . Acute on chronic respiratory failure with hypoxia and hypercapnia ([place]) 02/09/2019  . Acute on chronic diastolic CHF (congestive heart failure) ([place]) 02/09/2019  . AKI (acute kidney injury) ([place]) 02/09/2019  . PE (pulmonary thromboembolism) ([place]) 02/09/2019  . Asthma 02/09/2019  . GERD (gastroesophageal reflux disease) 02/09/2019  . Acute metabolic encephalopathy 09/73/5329  . Polypharmacy 11/17/2018  . 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 Type 2 diabetes mellitus with vascular disease ([place]) 01/10/2016  . HTN (hypertension) 03/09/2014  . Anxiety 03/08/2014    Resolved Hospital Problems  No resolved problems to display.    Discharge Condition: Stable  Diet recommendation: Resume previous diet  Vitals:   02/11/19 2312 02/12/19 0410  BP: 137/90 121/79  Pulse: 80 79  Resp: 20 18  Temp: 98.3 F (36.8 C) 98 F (36.7 C)  SpO2: 95% 94%    History of present illness:  Teresa Wiley a 63 y.o.femalewith medical history significant ofHTN, pulmonary HTN,dCHF, DM, chronic anticoagulation on Eliquis 2/2 PE in 2017, chronic pain syndrome on Percocet QID, chronic home O2 requirement of 3L,stroke, GERD, anxiety, polypharmacy,whopresents with shortness of breath and altered mental status.  Per her family, pthas been having shortness of breath in the past several days, which has worsened today.  She has dry cough, but no fever or chills. Not sure if patient has any chest pain. Patient is confused today. When I saw patient in the ED, she is orientated to the place, but not oriented to the time and person. She moves all extremities,no facial droop or slurred speech. No active nausea,vomiting, diarrhea or abdominal pain. Not sure if patient has any symptoms for UTI. Patient was found to have oxygendesaturations of 52% on room air in triage which improved to saturations100% on 4 L via nasal cannula.Familyreports recent 5 day tx with Levaquin for presumed PNA at PCP follow up ~3 weeks ago.  Hospital course complicated by acute on chronic hypoxic hypercarbic respiratory failure requiring BiPAP.  Placed on IV diuretics and tolerated well with improvement of respiratory status.  02/11/19: Seen and examined with her daughter present at bedside.  No acute events overnight.  She is now off BiPAP.  She has no new complaints.  02/12/19: Patient seen and examined at her bedside.  No acute events overnight.  She is on 3 L of oxygen by nasal cannula and saturating 97%.  She will have a repeat home O2 evaluation prior to discharge.  On the day of discharge, the patient was hemodynamically stable.  She will need to follow-up with her primary care provider for polypharmacy.  She will also need to follow-up with her cardiologist posthospitalization.  Patient understands and agrees to plan.  On the day of discharge, the patient was hemodynamically stable.  She will need to follow-up with her primary care provider for polypharmacy.  Patient understands and agrees to plan.    Hospital Course:  Principal Problem:   Acute on chronic respiratory failure with hypoxia and hypercapnia (  [place]) Active Problems:   Anxiety   HTN (hypertension)   Type 2 diabetes mellitus with vascular disease ([facility])   Stroke (cerebrum) ([facility])   Polypharmacy   Acute on chronic diastolic CHF (congestive heart failure) (H[facility])    AKI (acute kidney injury) ([place])   PE (pulmonary thromboembolism) (H[facility])   Asthma   GERD (gastroesophageal reflux disease)   Acute metabolic encephalopathy  Resolving acute on chronic hypoxic hypercarbic respiratory failure secondary to acute on chronic diastolic CHF and pulmonary hypertension On 3 L of oxygen at baseline continuously Continue Eliquis for DVT and PE Initially on BiPAP has been weaned off to nasal cannula Home O2 evaluation prior to discharge  Influenza A&B, viral panel negative  vital signs reviewed and are stable Afebrile with no leukocytosis DME oxygen and oximeter  Resolved acute metabolic encephalopathy, suspect secondary to polypharmacy CT head unremarkable for any acute intracranial findings Patient advised to follow-up with her primary care provider for adjustment of her medications due to polypharmacy  Acute on chronic diastolic CHF and pulmonary hypertension Last 2D echo done on 02/04/2019 revealed LVEF 55 to 60% with grade 1 diastolic dysfunction Continue cardiac medications BNP on admission 169 on 02/08/19 from 293 on 12/06/2018 Follow-up with cardiology  Chronic pain syndrome Cut down on doses of her medications on 02/11/2019 due lethargy secondary to polypharmacy Patient much improved after cutting down doses Strongly recommend patient have a close follow-up with her primary care provider to adjust her medications Patient understands and agrees to plan  Bilateral pleural effusions suspect iatrogenic from acute on chronic diastolic CHF Continue diuretics Management as stated above  IBS, constipation predominant Continue Linzess and Amitiza  HTN:  -Continue home medications:Amlodipine, Coreg -Hold lisinopril due to AKI  Type 2 diabetes mellitus with polyneuropathy Hemoglobin A1c 5.6 on 02/11/2019 Patient was takingJardiance, metformin, Ozempicat home Stop Jardiance and Ozempic Continue metformin 500 mg twice daily Avoid hypoglycemia   Follow-up with your PCP for adjustment of your medications  History of stroke (cerebrum) ([place]): -on eliquis  Resolved AKI: Presented with Cre 1.22  Baseline creatinine 0.9 Back to her baseline Continue to avoid nephrotoxic agents/dehydration/hypotension Monitor urine output  Asthma: -Continue DuoNeb nebulizers, Dulera inhaler, albuterol nebulizersprn  GERD: -Continue Protonix  Hx of DVT and PE (pulmonary thromboembolism) ([place]): Continue Eliquis  T11 and T12 hemangioma Follow-up with your PCP    DVT ppx:on Eliquis Code Status:Full code       Discharge Exam: BP 121/79 (BP Location: Right Arm)   Pulse 79   Temp 98 F (36.7 C) (Oral)   Resp 18   Ht 5\' 9"  (1.753 m)   Wt 108.3 kg   SpO2 94%   BMI 35.26 kg/m  . General: 63 y.o. year-old female well-developed well-nourished no acute distress.  Alert and oriented x3.   . Cardiovascular: Regular rate and rhythm with no rubs or gallops.  No JVD or thyromegaly noted.   Marland Kitchen Respiratory: Clear to auscultation with no wheezes or rales.  Poor inspiratory effort.   . Abdomen: Soft nontender nondistended with normal bowel sounds x4 quadrants. . Musculoskeletal: No lower extremity edema. 2/4 pulses in all 4 extremities. Marland Kitchen Psychiatry: Mood is appropriate for condition and setting  Discharge Instructions You were cared for by a hospitalist during your hospital stay. If you have any questions about your discharge medications or the care you received while you were in the hospital after you are discharged, you can call the unit and asked to speak with the hospitalist on call  if the hospitalist that took care of you is not available. Once you are discharged, your primary care physician will handle any further medical issues. Please note that NO REFILLS for any discharge medications will be authorized once you are discharged, as it is imperative that you return to your primary care physician (or establish a relationship with  a primary care physician if you do not have one) for your aftercare needs so that they can reassess your need for medications and monitor your lab values.   Allergies as of 02/12/2019      Reactions   Buprenorphine Hcl Hives   Pt immediately c/o itching and hives after administration of 4mg  Morphine IV, required Benadryl IV for relief. Pt immediately c/o itching and hives after administration of 4mg  Morphine IV, required Benadryl IV for relief.   Erythromycin Anaphylaxis, Nausea And Vomiting   Morphine And Related Hives   Pt immediately c/o itching and hives after administration of 4mg  Morphine IV, required Benadryl IV for relief.   Shellfish Allergy Anaphylaxis, Hives   Iodine Hives, Rash   Patient reports not having anaphylaxis to iodine. That was entered in mistake previously.      Medication List    STOP taking these medications   amLODipine 5 MG tablet Commonly known as:  NORVASC   baclofen 10 MG tablet Commonly known as:  LIORESAL   gabapentin 600 MG tablet Commonly known as:  NEURONTIN Replaced by:  gabapentin 100 MG capsule   ibuprofen 200 MG tablet Commonly known as:  ADVIL,MOTRIN   Jardiance 10 MG Tabs tablet Generic drug:  empagliflozin   lisinopril 2.5 MG tablet Commonly known as:  PRINIVIL,ZESTRIL   oxyCODONE-acetaminophen 10-325 MG tablet Commonly known as:  PERCOCET Replaced by:  oxyCODONE-acetaminophen 5-325 MG tablet   OZEMPIC (0.25 OR 0.5 MG/DOSE) [place]   sitaGLIPtin 100 MG tablet Commonly known as:  JANUVIA   tiotropium 18 MCG inhalation capsule Commonly known as:  SPIRIVA     TAKE these medications   albuterol (2.5 MG/3ML) 0.083% nebulizer solution Commonly known as:  PROVENTIL Take 2.5 mg by nebulization every 6 (six) hours as needed for wheezing or shortness of breath.   albuterol 108 (90 Base) MCG/ACT inhaler Commonly known as:  PROVENTIL HFA;VENTOLIN HFA Inhale 2 puffs into the lungs every 6 (six) hours as needed for wheezing or shortness  of breath. For shortness of breath   carvedilol 6.25 MG tablet Commonly known as:  COREG Take 1 tablet (6.25 mg total) by mouth 2 (two) times daily with a meal. What changed:    medication strength  how much to take   chlorhexidine 0.12 % solution Commonly known as:  PERIDEX Use as directed 15 mLs in the mouth or throat 2 (two) times daily.   dexlansoprazole 60 MG capsule Commonly known as:  DEXILANT Take 60 mg by mouth daily.   diazepam 2 MG tablet Commonly known as:  VALIUM Take 1 tablet (2 mg total) by mouth every 12 (twelve) hours as needed for anxiety. What changed:    medication strength  how much to take  when to take this   DULoxetine 60 MG capsule Commonly known as:  CYMBALTA Take 60 mg by mouth daily.   Eliquis 5 MG Tabs tablet Generic drug:  apixaban Take 5 mg by mouth 2 (two) times daily.   furosemide 40 MG tablet Commonly known as:  LASIX Take 1 tablet (40 mg total) by mouth 2 (two) times daily for 15 days. What changed:  when  to take this   gabapentin 100 MG capsule Commonly known as:  NEURONTIN Take 1 capsule (100 mg total) by mouth 3 (three) times daily. Replaces:  gabapentin 600 MG tablet   linaclotide 290 MCG Caps capsule Commonly known as:  LINZESS Take 290 mcg by mouth daily before breakfast.   lubiprostone 24 MCG capsule Commonly known as:  AMITIZA Take 24 mcg by mouth 2 (two) times daily as needed for constipation.   metFORMIN 500 MG tablet Commonly known as:  GLUCOPHAGE Take by mouth 2 (two) times daily with a meal.   mometasone-formoterol 100-5 MCG/ACT Aero Commonly known as:  DULERA Inhale 2 puffs into the lungs 2 (two) times daily.   ondansetron 4 MG tablet Commonly known as:  ZOFRAN Take 4 mg by mouth every 8 (eight) hours as needed for nausea or vomiting.   oxyCODONE-acetaminophen 5-325 MG tablet Commonly known as:  PERCOCET/ROXICET Take 1 tablet by mouth 2 (two) times daily as needed for severe pain. Replaces:   oxyCODONE-acetaminophen 10-325 MG tablet   OXYGEN Inhale 2.5 L into the lungs as needed (shortness of breath).   Vitamin D (Ergocalciferol) 1.25 MG (50000 UT) Caps capsule Commonly known as:  DRISDOL Take 50,000 Units by mouth every Wednesday.            Durable Medical Equipment  (From admission, onward)         Start     Ordered   02/11/19 1918  For home use only DME Walker rolling  Once    Comments:  5" wheels  Question:  Patient needs a walker to treat with the following condition  Answer:  Ambulatory dysfunction   02/11/19 1918   02/11/19 0518  For home use only DME oxygen  Once    Question Answer Comment  Mode or (Route) Nasal cannula   Liters per Minute 3   Frequency Continuous (stationary and portable oxygen unit needed)   Oxygen conserving device Yes   Oxygen delivery system Gas      02/11/19 0517   02/11/19 0518  For home use only DME Pulse oximeter  Once     02/11/19 0517         Allergies  Allergen Reactions  . Buprenorphine Hcl Hives    Pt immediately c/o itching and hives after administration of 4mg  Morphine IV, required Benadryl IV for relief. Pt immediately c/o itching and hives after administration of 4mg  Morphine IV, required Benadryl IV for relief.   . Erythromycin Anaphylaxis and Nausea And Vomiting  . Morphine And Related Hives    Pt immediately c/o itching and hives after administration of 4mg  Morphine IV, required Benadryl IV for relief.  . Shellfish Allergy Anaphylaxis and Hives  . Iodine Hives and Rash    Patient reports not having anaphylaxis to iodine. That was entered in mistake previously.   Follow-up Information    Secundino Ginger, PA-C. Call in 1 day(s).   Specialty:  Cardiology Why:  Please call for a post hospital follow-up appointment. Contact information: [facility]  [address] [place] [address] [phone]        [facility] Follow up.   Specialty:  Home Health Services Why:  RN, PT, OT  Contact information: [address] [place] [address] [phone]        [facility] Follow up.   Why:  oxygen,            The results of significant diagnostics from  this hospitalization (including imaging, microbiology, ancillary and laboratory) are listed below for reference.    Significant Diagnostic Studies: Dg Chest 2 View  Result Date: 02/08/2019 CLINICAL DATA:  Shortness of breath EXAM: CHEST - 2 VIEW COMPARISON:  12/06/2018 FINDINGS: Small bilateral pleural effusions, right greater than left airspace disease at the right base. Cardiomegaly with vascular congestion and mild edema. No pneumothorax. IMPRESSION: 1. Cardiomegaly with vascular congestion and pulmonary edema. 2. Small right greater than left pleural effusions. Right basilar atelectasis or pneumonia Electronically Signed   By: Donavan Foil M.D.   On: 02/08/2019 22:53   Ct Head Wo Contrast  Result Date: 02/09/2019 CLINICAL DATA:  AMS. Pt on Eliquis. Hx HTN and diabetes. EXAM: CT HEAD WITHOUT CONTRAST TECHNIQUE: Contiguous axial images were obtained from the base of the skull through the vertex without intravenous contrast. COMPARISON:  12/06/2018 FINDINGS: Brain: No evidence of acute infarction, hemorrhage, hydrocephalus, extra-axial collection or mass lesion/mass effect. Vascular: No hyperdense vessel or unexpected calcification. Skull: Normal. Negative for fracture or focal lesion. Sinuses/Orbits: Globes and orbits are unremarkable. The visualized sinuses and mastoid air cells are clear. Other: None. IMPRESSION: Normal unenhanced CT scan of the brain. Electronically Signed   By: Lajean Manes M.D.   On: 02/09/2019 01:31   Ct Chest Wo Contrast  Result Date: 02/10/2019 CLINICAL DATA:  Upper chest heaviness and shortness of breath EXAM: CT CHEST WITHOUT CONTRAST TECHNIQUE: Multidetector CT imaging of the chest was performed following the standard protocol without IV contrast. COMPARISON:  12/19/2013  FINDINGS: Cardiovascular: Normal heart size. No pericardial effusion. No vascular findings. Mediastinum/Nodes: Moderate sliding hiatal hernia. Lungs/Pleura: Small pleural effusions and multi segment dependent atelectasis. Variable airway collapse, advanced in the right. Patchy air trapping at the apices. No generalized Kerley lines or air bronchogram. Subpleural fat expansion, chronic Upper Abdomen: No acute finding Musculoskeletal: No acute or aggressive finding. T11 and T12 hemangioma. IMPRESSION: 1. Multi segment atelectasis and small pleural effusions. 2. 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 3. Moderate hiatal hernia. Electronically Signed   By: Monte Fantasia M.D.   On: 02/10/2019 09:09    Microbiology: Recent Results (from the past 240 hour(s))  MRSA PCR Screening     Status: None   Collection Time: 02/09/19 11:00 AM  Result Value Ref Range Status   MRSA by PCR NEGATIVE NEGATIVE Final    Comment:        The GeneXpert MRSA Assay (FDA approved for NASAL specimens only), is one component of a comprehensive MRSA colonization surveillance program. It is not intended to diagnose MRSA infection nor to guide or monitor treatment for MRSA infections. Performed at [facility], [place] [address] [place], [place] [address]   Respiratory Panel by PCR     Status: None   Collection Time: 02/10/19  8:13 AM  Result Value Ref Range Status   Adenovirus NOT DETECTED NOT DETECTED Final   Coronavirus 229E NOT DETECTED NOT DETECTED Final    Comment: (NOTE) The Coronavirus on the Respiratory Panel, DOES NOT test for the novel  Coronavirus (2019 nCoV)    Coronavirus HKU1 NOT DETECTED NOT DETECTED Final   Coronavirus NL63 NOT DETECTED NOT DETECTED Final   Coronavirus OC43 NOT DETECTED NOT DETECTED Final   Metapneumovirus NOT DETECTED NOT DETECTED Final   Rhinovirus / Enterovirus NOT DETECTED NOT DETECTED Final   Influenza A NOT DETECTED NOT  DETECTED Final   Influenza B NOT DETECTED NOT DETECTED Final  Parainfluenza Virus 1 NOT DETECTED NOT DETECTED Final   Parainfluenza Virus 2 NOT DETECTED NOT DETECTED Final   Parainfluenza Virus 3 NOT DETECTED NOT DETECTED Final   Parainfluenza Virus 4 NOT DETECTED NOT DETECTED Final   Respiratory Syncytial Virus NOT DETECTED NOT DETECTED Final   Bordetella pertussis NOT DETECTED NOT DETECTED Final   Chlamydophila pneumoniae NOT DETECTED NOT DETECTED Final   Mycoplasma pneumoniae NOT DETECTED NOT DETECTED Final    Comment: Performed at [facility], [place] [address] [place], [place] [address]     Labs: Basic Metabolic Panel: Recent Labs  Lab 02/08/19 2242 02/08/19 2251 02/08/19 2312 02/10/19 0210 02/11/19 0509  NA 139 140 140 139 136  K 4.2 4.3 4.2 3.6 3.5  CL 102  --   --  100 100  CO2 30  --   --  32 28  GLUCOSE 107*  --   --  91 106*  BUN 16  --   --  12 14  CREATININE 1.22*  --   --  0.91 0.87  CALCIUM 8.6*  --   --  8.8* 9.2  MG  --   --   --  2.2  --    Liver Function Tests: Recent Labs  Lab 02/08/19 2242  AST 12*  ALT 8  ALKPHOS 81  BILITOT 0.9  PROT 7.5  ALBUMIN 3.3*   No results for input(s): LIPASE, AMYLASE in the last 168 hours. No results for input(s): AMMONIA in the last 168 hours. CBC: Recent Labs  Lab 02/08/19 2242 02/08/19 2251 02/08/19 2312  WBC 7.2  --   --   NEUTROABS 4.9  --   --   HGB 9.3* 10.9* 10.5*  HCT 34.9* 32.0* 31.0*  MCV 89.5  --   --   PLT 289  --   --    Cardiac Enzymes: No results for input(s): CKTOTAL, CKMB, CKMBINDEX, TROPONINI in the last 168 hours. BNP: BNP (last 3 results) Recent Labs    11/17/18 1052 12/06/18 1225 02/08/19 2242  BNP 19.0 293.4* 169.5*    ProBNP (last 3 results) No results for input(s): PROBNP in the last 8760 hours.  CBG: Recent Labs  Lab 02/11/19 1133 02/11/19 1637 02/11/19 1839 02/11/19 2146 02/12/19 0727  GLUCAP 121* 86 138* 82 133*       Signed:  Kayleen Memos, MD  [facility] 02/12/2019, 7:34 AM

## 2019-02-12 NOTE — Progress Notes (Signed)
Physical Therapy Treatment Patient Details Name: Teresa Wiley MRN: [id number] DOB: Sep 20, 1956 Today's Date: 02/12/2019    History of Present Illness Teresa Wiley is a 63 y.o. female with medical history significant of HTN, pulmonary HTN, dCHF, DM, chronic anticoagulation on Eliquis 2/2 PE in 2017, chronic pain syndrome on Percocet QID, chronic home O2 requirement of 3L, stroke, GERD, anxiety, polypharmacy, who presents with shortness of breath and altered mental status.    PT Comments    Patient with improved tolerance to activity today, ambulating further distances without rest on 6L while VSS. Patient demonstrates poor safety and insight into diminishing balance and stability with exertion, continue to recommend she have 24 hour supervision for all OOB mobility at this time.     Follow Up Recommendations  Home health PT;Supervision/Assistance - 24 hour     Equipment Recommendations  (rollator )    Recommendations for Other Services       Precautions / Restrictions Precautions Precautions: Fall Restrictions Weight Bearing Restrictions: No    Mobility  Bed Mobility Overal bed mobility: Modified Independent                Transfers Overall transfer level: Needs assistance Equipment used: None;1 person hand held assist Transfers: Sit to/from Stand              Ambulation/Gait Ambulation/Gait assistance: Min assist Gait Distance (Feet): 80 Feet Assistive device: None Gait Pattern/deviations: Step-to pattern;Step-through pattern Gait velocity: decreased   General Gait Details: pt walking with imporved tolerance today on 6L, VSS. no vision delays or neeed for rest break. balance decreases as she moblilizes   Marine scientist [place] (Stroke Patients Only)       Balance Overall balance assessment: Needs assistance   Sitting balance-Leahy Scale: Fair       Standing balance-Leahy Scale: Poor                              Cognition Arousal/Alertness: Awake/alert Behavior During Therapy: WFL for tasks assessed/performed Overall Cognitive Status: Within Functional Limits for tasks assessed                                        Exercises      General Comments        Pertinent Vitals/Pain Pain Assessment: No/denies pain    Home Living                      Prior Function            PT Goals (current goals can now be found in the care plan section) Acute Rehab PT Goals Patient Stated Goal: go home when able PT Goal Formulation: With patient Time For Goal Achievement: 02/25/19 Potential to Achieve Goals: Good Progress towards PT goals: Progressing toward goals    Frequency    Min 3X/week      PT Plan Current plan remains appropriate    Co-evaluation              AM-PAC PT "6 Clicks" Mobility   Outcome Measure  Help needed turning from your back to your side while in a flat bed without using bedrails?: A Little Help needed moving from lying on your back to sitting on  the side of a flat bed without using bedrails?: A Little Help needed moving to and from a bed to a chair (including a wheelchair)?: A Little Help needed standing up from a chair using your arms (e.g., wheelchair or bedside chair)?: A Little Help needed to walk in hospital room?: A Little Help needed climbing 3-5 steps with a railing? : A Little 6 Click Score: 18    End of Session Equipment Utilized During Treatment: Gait belt Activity Tolerance: Patient tolerated treatment well Patient left: in bed Nurse Communication: Mobility status PT Visit Diagnosis: Unsteadiness on feet (R26.81)     Time: 3832-9191 PT Time Calculation (min) (ACUTE ONLY): 27 min  Charges:  $Gait Training: 8-22 mins $Therapeutic Activity: 8-22 mins                     Reinaldo Berber, PT, DPT Acute Rehabilitation Services Pager: [phone] Office: [phone]     Reinaldo Berber 02/12/2019, 9:47 AM

## 2019-06-17 ENCOUNTER — Encounter: Payer: Self-pay | Admitting: Gastroenterology

## 2020-03-06 ENCOUNTER — Encounter: Payer: Self-pay | Admitting: Internal Medicine

## 2020-03-06 ENCOUNTER — Ambulatory Visit (INDEPENDENT_AMBULATORY_CARE_PROVIDER_SITE_OTHER): Payer: Medicaid Other | Admitting: Internal Medicine

## 2020-03-06 ENCOUNTER — Telehealth: Payer: Self-pay | Admitting: *Deleted

## 2020-03-06 ENCOUNTER — Other Ambulatory Visit: Payer: Self-pay

## 2020-03-06 VITALS — BP 106/80 | HR 93 | Temp 97.5°F | Ht 69.0 in

## 2020-03-06 DIAGNOSIS — R0602 Shortness of breath: Secondary | ICD-10-CM
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
## 2020-03-06 NOTE — Telephone Encounter (Signed)
Called patient per Dr. Mauricio Po request to let her know that a split night study was ordered to further evaluate her heart and lung function.  I had to leave a VM and requested that patient return call to the office.

## 2020-03-06 NOTE — Patient Instructions (Addendum)
The patient should have follow up scheduled with APP in 3 months.   I need to get some records from your PA [facility]aias Cowman regarding sleep study and why you are on anticoaguation with Eliquis.    There is no mass on your lungs.

## 2020-03-15 ENCOUNTER — Telehealth: Payer: Self-pay | Admitting: Pulmonary Disease

## 2020-03-15 DIAGNOSIS — R0602 Shortness of breath: Secondary | ICD-10-CM

## 2020-03-15 NOTE — Telephone Encounter (Signed)
Returned call to [employer].  Marlo stated Patient was evaluated on POC.  Marlo stated order placed for POC was for 2.5L. Marlo stated POC needs to be set a 3L.  Mitzie Na is requesting a new order for POC 3L to [facility]. Patient last OV 03/06/20 with Dr. Shearon Stalls.   Dr.Desai, please advise on DME POC order.

## 2020-03-20 NOTE — Telephone Encounter (Signed)
Ok to updated order to [facility]

## 2020-03-20 NOTE — Telephone Encounter (Signed)
Order placed for POC set at 3L. Nothing further needed.

## 2020-03-23 ENCOUNTER — Other Ambulatory Visit (HOSPITAL_COMMUNITY)
Admission: RE | Admit: 2020-03-23 | Discharge: 2020-03-23 | Disposition: A | Discharge status: Home / Self Care | Payer: Medicaid Other | Source: Ambulatory Visit | Attending: Pulmonary Disease | Admitting: Pulmonary Disease

## 2020-03-23 DIAGNOSIS — Z01812 Encounter for preprocedural laboratory examination: Secondary | ICD-10-CM | POA: Diagnosis present

## 2020-03-23 DIAGNOSIS — Z20822 Contact with and (suspected) exposure to covid-19: Secondary | ICD-10-CM | POA: Diagnosis not present

## 2020-03-23 LAB — SARS CORONAVIRUS 2 (TAT 6-24 HRS): SARS Coronavirus 2: NEGATIVE

## 2020-03-25 ENCOUNTER — Ambulatory Visit (HOSPITAL_BASED_OUTPATIENT_CLINIC_OR_DEPARTMENT_OTHER): Payer: Medicaid Other | Attending: Internal Medicine | Admitting: Pulmonary Disease

## 2020-03-25 DIAGNOSIS — Z79899 Other long term (current) drug therapy: Secondary | ICD-10-CM | POA: Diagnosis not present

## 2020-03-25 DIAGNOSIS — R0602 Shortness of breath: Secondary | ICD-10-CM

## 2020-03-25 DIAGNOSIS — Z7901 Long term (current) use of anticoagulants: Secondary | ICD-10-CM | POA: Diagnosis not present

## 2020-03-25 DIAGNOSIS — Z7984 Long term (current) use of oral hypoglycemic drugs: Secondary | ICD-10-CM | POA: Insufficient documentation

## 2020-03-25 DIAGNOSIS — R0902 Hypoxemia: Secondary | ICD-10-CM | POA: Diagnosis not present

## 2020-03-26 ENCOUNTER — Other Ambulatory Visit: Payer: Self-pay

## 2020-03-27 DIAGNOSIS — G4733 Obstructive sleep apnea (adult) (pediatric): Secondary | ICD-10-CM

## 2020-03-27 NOTE — Procedures (Signed)
Patient Name: Teresa Wiley, Teresa Wiley Date: 03/25/2020 Gender: Female D.O.B: 1956/05/02 Age (years): 89 Referring Provider: Lenice Llamas MD Height (inches): 76 Interpreting Physician: Kara Mead MD, ABSM Weight (lbs): 254 RPSGT: Earney Hamburg BMI: 37 MRN: [id number] Neck Size: 15.00 <br> <br> CLINICAL INFORMATION Sleep Study Type: NPSG    Indication for sleep study: pulmonary hypertension, r/o OSA    Epworth Sleepiness Score: 3    SLEEP STUDY TECHNIQUE As per the AASM Manual for the Scoring of Sleep and Associated Events v2.3 (April 2016) with a hypopnea requiring 4% desaturations.  The channels recorded and monitored were frontal, central and occipital EEG, electrooculogram (EOG), submentalis EMG (chin), nasal and oral airflow, thoracic and abdominal wall motion, anterior tibialis EMG, snore microphone, electrocardiogram, and pulse oximetry.  MEDICATIONS Medications self-administered by patient taken the night of the study : TYLENOL PM, ACETAMINOPHEN-HYDROCODONE, ELIQUIS, GABAPENTIN, LISOPRIL, METFORMIN  SLEEP ARCHITECTURE The study was initiated at 11:03:37 PM and ended at 5:23:14 AM.  Sleep onset time was 17.8 minutes and the sleep efficiency was 80.0%%. The total sleep time was 303.8 minutes.  Stage REM latency was 128.0 minutes.  The patient spent 1.2%% of the night in stage N1 sleep, 85.4%% in stage N2 sleep, 0.0%% in stage N3 and 13.5% in REM.  Alpha intrusion was absent.  Supine sleep was 88.05%.  RESPIRATORY PARAMETERS The overall apnea/hypopnea index (AHI) was 0.0 per hour. There were 0 total apneas, including 0 obstructive, 0 central and 0 mixed apneas. There were 0 hypopneas and 4 RERAs.  The AHI during Stage REM sleep was 0.0 per hour.  AHI while supine was 0.0 per hour.  The mean oxygen saturation was 93.1%. The minimum SpO2 during sleep was 60.0%.2.5 L O2 was applied at 1140 pm  soft snoring was noted during this study.  CARDIAC DATA The  2 lead EKG demonstrated sinus rhythm. The mean heart rate was 87.3 beats per minute. Other EKG findings include: None.   LEG MOVEMENT DATA The total PLMS were 0 with a resulting PLMS index of 0.0. Associated arousal with leg movement index was 0.0 .  IMPRESSIONS - No significant obstructive sleep apnea occurred during this study (AHI = 0.0/h). - No significant central sleep apnea occurred during this study (CAI = 0.0/h). - Severe oxygen desaturation was noted during this study (Min O2 = 60.0%). This was corrected with 2.5 L O2 - The patient snored with soft snoring volume. - No cardiac abnormalities were noted during this study. - Clinically significant periodic limb movements did not occur during sleep. No significant associated arousals.   DIAGNOSIS - Nocturnal Hypoxemia (327.26 [G47.36 ICD-10]) related to cardiopulmonary disease   RECOMMENDATIONS - Sleep with 2.5 L O2 - Avoid alcohol, sedatives and other CNS depressants that may worsen sleep apnea and disrupt normal sleep architecture. - Sleep hygiene should be reviewed to assess factors that may improve sleep quality. - Weight management and regular exercise should be initiated or continued if appropriate.   Kara Mead MD Board Certified in [place]

## 2020-03-28 NOTE — Progress Notes (Signed)
Patient already on [facility] continuously. Will continue

## 2020-06-05 ENCOUNTER — Ambulatory Visit: Payer: Medicaid Other | Admitting: Primary Care

## 2020-07-01 ENCOUNTER — Inpatient Hospital Stay (HOSPITAL_COMMUNITY): Payer: Medicaid Other

## 2020-07-01 ENCOUNTER — Other Ambulatory Visit: Payer: Self-pay

## 2020-07-01 ENCOUNTER — Emergency Department (HOSPITAL_COMMUNITY): Payer: Medicaid Other

## 2020-07-01 ENCOUNTER — Inpatient Hospital Stay (HOSPITAL_COMMUNITY)
Admission: EM | Admit: 2020-07-01 | Discharge: 2020-07-05 | DRG: 871 | Disposition: A | Discharge status: Home Health | Payer: Medicaid Other | Attending: Internal Medicine | Admitting: Internal Medicine

## 2020-07-01 ENCOUNTER — Encounter (HOSPITAL_COMMUNITY): Payer: Self-pay

## 2020-07-01 DIAGNOSIS — Z7984 Long term (current) use of oral hypoglycemic drugs: Secondary | ICD-10-CM

## 2020-07-01 DIAGNOSIS — Z79899 Other long term (current) drug therapy: Secondary | ICD-10-CM

## 2020-07-01 DIAGNOSIS — J9621 Acute and chronic respiratory failure with hypoxia: Secondary | ICD-10-CM | POA: Diagnosis present

## 2020-07-01 DIAGNOSIS — M545 Low back pain: Secondary | ICD-10-CM | POA: Diagnosis present

## 2020-07-01 DIAGNOSIS — Z823 Family history of stroke: Secondary | ICD-10-CM

## 2020-07-01 DIAGNOSIS — J9611 Chronic respiratory failure with hypoxia: Secondary | ICD-10-CM | POA: Diagnosis present

## 2020-07-01 DIAGNOSIS — Z91041 Radiographic dye allergy status: Secondary | ICD-10-CM

## 2020-07-01 DIAGNOSIS — D649 Anemia, unspecified: Secondary | ICD-10-CM | POA: Diagnosis present

## 2020-07-01 DIAGNOSIS — K219 Gastro-esophageal reflux disease without esophagitis: Secondary | ICD-10-CM | POA: Diagnosis present

## 2020-07-01 DIAGNOSIS — I272 Pulmonary hypertension, unspecified: Secondary | ICD-10-CM | POA: Diagnosis present

## 2020-07-01 DIAGNOSIS — Y92009 Unspecified place in unspecified non-institutional (private) residence as the place of occurrence of the external cause: Secondary | ICD-10-CM

## 2020-07-01 DIAGNOSIS — G894 Chronic pain syndrome: Secondary | ICD-10-CM | POA: Diagnosis present

## 2020-07-01 DIAGNOSIS — Z8673 Personal history of transient ischemic attack (TIA), and cerebral infarction without residual deficits: Secondary | ICD-10-CM

## 2020-07-01 DIAGNOSIS — E669 Obesity, unspecified: Secondary | ICD-10-CM | POA: Diagnosis present

## 2020-07-01 DIAGNOSIS — J9601 Acute respiratory failure with hypoxia: Secondary | ICD-10-CM

## 2020-07-01 DIAGNOSIS — A419 Sepsis, unspecified organism: Principal | ICD-10-CM | POA: Diagnosis present

## 2020-07-01 DIAGNOSIS — M5136 Other intervertebral disc degeneration, lumbar region: Secondary | ICD-10-CM | POA: Diagnosis present

## 2020-07-01 DIAGNOSIS — Z881 Allergy status to other antibiotic agents status: Secondary | ICD-10-CM

## 2020-07-01 DIAGNOSIS — Z20822 Contact with and (suspected) exposure to covid-19: Secondary | ICD-10-CM | POA: Diagnosis present

## 2020-07-01 DIAGNOSIS — R6520 Severe sepsis without septic shock: Secondary | ICD-10-CM | POA: Diagnosis present

## 2020-07-01 DIAGNOSIS — Z8249 Family history of ischemic heart disease and other diseases of the circulatory system: Secondary | ICD-10-CM

## 2020-07-01 DIAGNOSIS — I959 Hypotension, unspecified: Secondary | ICD-10-CM | POA: Diagnosis present

## 2020-07-01 DIAGNOSIS — K5900 Constipation, unspecified: Secondary | ICD-10-CM | POA: Diagnosis present

## 2020-07-01 DIAGNOSIS — Z888 Allergy status to other drugs, medicaments and biological substances status: Secondary | ICD-10-CM

## 2020-07-01 DIAGNOSIS — G4700 Insomnia, unspecified: Secondary | ICD-10-CM | POA: Diagnosis present

## 2020-07-01 DIAGNOSIS — J189 Pneumonia, unspecified organism: Secondary | ICD-10-CM | POA: Diagnosis present

## 2020-07-01 DIAGNOSIS — Z9981 Dependence on supplemental oxygen: Secondary | ICD-10-CM

## 2020-07-01 DIAGNOSIS — Z87891 Personal history of nicotine dependence: Secondary | ICD-10-CM

## 2020-07-01 DIAGNOSIS — R1310 Dysphagia, unspecified: Secondary | ICD-10-CM | POA: Diagnosis present

## 2020-07-01 DIAGNOSIS — Z7901 Long term (current) use of anticoagulants: Secondary | ICD-10-CM | POA: Diagnosis not present

## 2020-07-01 DIAGNOSIS — I10 Essential (primary) hypertension: Secondary | ICD-10-CM | POA: Diagnosis present

## 2020-07-01 DIAGNOSIS — I5032 Chronic diastolic (congestive) heart failure: Secondary | ICD-10-CM | POA: Diagnosis present

## 2020-07-01 DIAGNOSIS — J45909 Unspecified asthma, uncomplicated: Secondary | ICD-10-CM | POA: Diagnosis present

## 2020-07-01 DIAGNOSIS — W19XXXA Unspecified fall, initial encounter: Secondary | ICD-10-CM | POA: Diagnosis present

## 2020-07-01 DIAGNOSIS — Z86711 Personal history of pulmonary embolism: Secondary | ICD-10-CM | POA: Diagnosis not present

## 2020-07-01 DIAGNOSIS — I110 Hypertensive heart disease with heart failure: Secondary | ICD-10-CM | POA: Diagnosis present

## 2020-07-01 DIAGNOSIS — Z885 Allergy status to narcotic agent status: Secondary | ICD-10-CM

## 2020-07-01 DIAGNOSIS — F419 Anxiety disorder, unspecified: Secondary | ICD-10-CM | POA: Diagnosis present

## 2020-07-01 DIAGNOSIS — M549 Dorsalgia, unspecified: Secondary | ICD-10-CM

## 2020-07-01 DIAGNOSIS — N179 Acute kidney failure, unspecified: Secondary | ICD-10-CM | POA: Diagnosis present

## 2020-07-01 DIAGNOSIS — E119 Type 2 diabetes mellitus without complications: Secondary | ICD-10-CM | POA: Diagnosis present

## 2020-07-01 DIAGNOSIS — Z6837 Body mass index (BMI) 37.0-37.9, adult: Secondary | ICD-10-CM

## 2020-07-01 DIAGNOSIS — R8290 Unspecified abnormal findings in urine: Secondary | ICD-10-CM | POA: Diagnosis not present

## 2020-07-01 DIAGNOSIS — E875 Hyperkalemia: Secondary | ICD-10-CM | POA: Diagnosis not present

## 2020-07-01 DIAGNOSIS — Z91013 Allergy to seafood: Secondary | ICD-10-CM

## 2020-07-01 LAB — CBC WITH DIFFERENTIAL/PLATELET
Abs Immature Granulocytes: 0.25 10*3/uL — ABNORMAL HIGH (ref 0.00–0.07)
Basophils Absolute: 0 10*3/uL (ref 0.0–0.1)
Basophils Relative: 0 %
Eosinophils Absolute: 0 10*3/uL (ref 0.0–0.5)
Eosinophils Relative: 0 %
HCT: 36.8 % (ref 36.0–46.0)
Hemoglobin: 10.6 g/dL — ABNORMAL LOW (ref 12.0–15.0)
Immature Granulocytes: 1 %
Lymphocytes Relative: 6 %
Lymphs Abs: 1.6 10*3/uL (ref 0.7–4.0)
MCH: 25.8 pg — ABNORMAL LOW (ref 26.0–34.0)
MCHC: 28.8 g/dL — ABNORMAL LOW (ref 30.0–36.0)
MCV: 89.5 fL (ref 80.0–100.0)
Monocytes Absolute: 1.3 10*3/uL — ABNORMAL HIGH (ref 0.1–1.0)
Monocytes Relative: 5 %
Neutro Abs: 22 10*3/uL — ABNORMAL HIGH (ref 1.7–7.7)
Neutrophils Relative %: 88 %
Platelets: 321 10*3/uL (ref 150–400)
RBC: 4.11 MIL/uL (ref 3.87–5.11)
RDW: 14.6 % (ref 11.5–15.5)
WBC: 25.2 10*3/uL — ABNORMAL HIGH (ref 4.0–10.5)
nRBC: 0 % (ref 0.0–0.2)

## 2020-07-01 LAB — COMPREHENSIVE METABOLIC PANEL
ALT: 20 U/L (ref 0–44)
AST: 41 U/L (ref 15–41)
Albumin: 3 g/dL — ABNORMAL LOW (ref 3.5–5.0)
Alkaline Phosphatase: 76 U/L (ref 38–126)
Anion gap: 11 (ref 5–15)
BUN: 22 mg/dL (ref 8–23)
CO2: 27 mmol/L (ref 22–32)
Calcium: 8.5 mg/dL — ABNORMAL LOW (ref 8.9–10.3)
Chloride: 100 mmol/L (ref 98–111)
Creatinine, Ser: 2.74 mg/dL — ABNORMAL HIGH (ref 0.44–1.00)
GFR calc Af Amer: 21 mL/min — ABNORMAL LOW (ref >60)
GFR calc non Af Amer: 18 mL/min — ABNORMAL LOW (ref >60)
Glucose, Bld: 127 mg/dL — ABNORMAL HIGH (ref 70–99)
Potassium: 4.8 mmol/L (ref 3.5–5.1)
Sodium: 138 mmol/L (ref 135–145)
Total Bilirubin: 0.9 mg/dL (ref 0.3–1.2)
Total Protein: 6.6 g/dL (ref 6.5–8.1)

## 2020-07-01 LAB — BASIC METABOLIC PANEL
Anion gap: 13 (ref 5–15)
BUN: 22 mg/dL (ref 8–23)
CO2: 26 mmol/L (ref 22–32)
Calcium: 8.8 mg/dL — ABNORMAL LOW (ref 8.9–10.3)
Chloride: 98 mmol/L (ref 98–111)
Creatinine, Ser: 2.68 mg/dL — ABNORMAL HIGH (ref 0.44–1.00)
GFR calc Af Amer: 21 mL/min — ABNORMAL LOW (ref >60)
GFR calc non Af Amer: 18 mL/min — ABNORMAL LOW (ref >60)
Glucose, Bld: 130 mg/dL — ABNORMAL HIGH (ref 70–99)
Potassium: 5.4 mmol/L — ABNORMAL HIGH (ref 3.5–5.1)
Sodium: 137 mmol/L (ref 135–145)

## 2020-07-01 LAB — PROTIME-INR
INR: 1.5 — ABNORMAL HIGH (ref 0.8–1.2)
Prothrombin Time: 17.6 seconds — ABNORMAL HIGH (ref 11.4–15.2)

## 2020-07-01 LAB — URINALYSIS, ROUTINE W REFLEX MICROSCOPIC
Bilirubin Urine: NEGATIVE
Glucose, UA: NEGATIVE mg/dL
Ketones, ur: NEGATIVE mg/dL
Nitrite: NEGATIVE
Protein, ur: NEGATIVE mg/dL
Specific Gravity, Urine: 1.01 (ref 1.005–1.030)
pH: 5 (ref 5.0–8.0)

## 2020-07-01 LAB — I-STAT VENOUS BLOOD GAS, ED
Acid-Base Excess: 5 mmol/L — ABNORMAL HIGH (ref 0.0–2.0)
Bicarbonate: 30.8 mmol/L — ABNORMAL HIGH (ref 20.0–28.0)
Calcium, Ion: 1.05 mmol/L — ABNORMAL LOW (ref 1.15–1.40)
HCT: 37 % (ref 36.0–46.0)
Hemoglobin: 12.6 g/dL (ref 12.0–15.0)
O2 Saturation: 78 %
Potassium: 5.5 mmol/L — ABNORMAL HIGH (ref 3.5–5.1)
Sodium: 137 mmol/L (ref 135–145)
TCO2: 32 mmol/L (ref 22–32)
pCO2, Ven: 51 mmHg (ref 44.0–60.0)
pH, Ven: 7.388 (ref 7.250–7.430)
pO2, Ven: 44 mmHg (ref 32.0–45.0)

## 2020-07-01 LAB — BRAIN NATRIURETIC PEPTIDE: B Natriuretic Peptide: 70.1 pg/mL (ref 0.0–100.0)

## 2020-07-01 LAB — LACTIC ACID, PLASMA: Lactic Acid, Venous: 1.9 mmol/L (ref 0.5–1.9)

## 2020-07-01 LAB — SARS CORONAVIRUS 2 BY RT PCR (HOSPITAL ORDER, PERFORMED IN ~~LOC~~ HOSPITAL LAB): SARS Coronavirus 2: NEGATIVE

## 2020-07-01 LAB — TROPONIN I (HIGH SENSITIVITY): Troponin I (High Sensitivity): 4 ng/L (ref <18)

## 2020-07-01 LAB — APTT: aPTT: 40 seconds — ABNORMAL HIGH (ref 24–36)

## 2020-07-01 MED ORDER — LACTATED RINGERS IV BOLUS (SEPSIS): 1000.0000 mL | Freq: Once | INTRAVENOUS | Status: DC

## 2020-07-01 MED ORDER — FUROSEMIDE 10 MG/ML IJ SOLN
60.0000 mg | Freq: Once | INTRAMUSCULAR | Status: AC
  Administered 2020-07-01: 60 mg via INTRAVENOUS
  Filled 2020-07-01: qty 6

## 2020-07-01 MED ORDER — LACTATED RINGERS IV SOLN: INTRAVENOUS | Status: DC

## 2020-07-01 MED ORDER — HYDROMORPHONE HCL 1 MG/ML IJ SOLN
0.5000 mg | Freq: Once | INTRAMUSCULAR | Status: AC
  Administered 2020-07-01: 0.5 mg via INTRAVENOUS
  Filled 2020-07-01: qty 1

## 2020-07-01 MED ORDER — SODIUM CHLORIDE 0.9 % IV BOLUS
2000.0000 mL | Freq: Once | INTRAVENOUS | Status: AC
  Administered 2020-07-01: 2000 mL via INTRAVENOUS

## 2020-07-01 MED ORDER — LACTATED RINGERS IV BOLUS (SEPSIS): 200.0000 mL | Freq: Once | INTRAVENOUS | Status: DC

## 2020-07-01 MED ORDER — HYDROMORPHONE HCL 1 MG/ML IJ SOLN: 0.5000 mg | INTRAMUSCULAR | Status: DC | PRN

## 2020-07-01 MED ORDER — ACETAMINOPHEN 325 MG PO TABS
650.0000 mg | ORAL_TABLET | Freq: Four times a day (QID) | ORAL | Status: DC | PRN
  Administered 2020-07-02: 650 mg via ORAL
  Filled 2020-07-01: qty 2

## 2020-07-01 MED ORDER — SODIUM CHLORIDE 0.9 % IV SOLN
2.0000 g | INTRAVENOUS | Status: DC
  Administered 2020-07-01: 2 g via INTRAVENOUS
  Filled 2020-07-01 (×2): qty 20

## 2020-07-01 MED ORDER — ALPRAZOLAM 0.5 MG PO TABS
1.0000 mg | ORAL_TABLET | Freq: Three times a day (TID) | ORAL | Status: DC
  Administered 2020-07-02 – 2020-07-05 (×9): 1 mg via ORAL
  Filled 2020-07-01 (×10): qty 2

## 2020-07-01 MED ORDER — ONDANSETRON HCL 4 MG PO TABS: 4.0000 mg | ORAL_TABLET | Freq: Four times a day (QID) | ORAL | Status: DC | PRN

## 2020-07-01 MED ORDER — PANTOPRAZOLE SODIUM 40 MG PO TBEC
40.0000 mg | DELAYED_RELEASE_TABLET | Freq: Every day | ORAL | Status: DC
  Administered 2020-07-02 – 2020-07-05 (×4): 40 mg via ORAL
  Filled 2020-07-01 (×4): qty 1

## 2020-07-01 MED ORDER — HYDROMORPHONE HCL 1 MG/ML IJ SOLN
1.0000 mg | Freq: Once | INTRAMUSCULAR | Status: AC
  Administered 2020-07-01: 1 mg via INTRAVENOUS
  Filled 2020-07-01: qty 1

## 2020-07-01 MED ORDER — ONDANSETRON HCL 4 MG/2ML IJ SOLN: 4.0000 mg | Freq: Four times a day (QID) | INTRAMUSCULAR | Status: DC | PRN

## 2020-07-01 MED ORDER — APIXABAN 5 MG PO TABS
5.0000 mg | ORAL_TABLET | Freq: Two times a day (BID) | ORAL | Status: DC
  Administered 2020-07-02 – 2020-07-05 (×7): 5 mg via ORAL
  Filled 2020-07-01 (×7): qty 1

## 2020-07-01 MED ORDER — INSULIN ASPART 100 UNIT/ML ~~LOC~~ SOLN
0.0000 [IU] | Freq: Every day | SUBCUTANEOUS | Status: DC
  Administered 2020-07-02: 2 [IU] via SUBCUTANEOUS

## 2020-07-01 MED ORDER — HYDROMORPHONE HCL 1 MG/ML IJ SOLN
0.5000 mg | INTRAMUSCULAR | Status: DC | PRN
  Administered 2020-07-02 – 2020-07-03 (×9): 1 mg via INTRAVENOUS
  Filled 2020-07-01 (×9): qty 1

## 2020-07-01 MED ORDER — SODIUM CHLORIDE 0.9 % IV SOLN
100.0000 mg | Freq: Two times a day (BID) | INTRAVENOUS | Status: DC
  Administered 2020-07-01 – 2020-07-04 (×6): 100 mg via INTRAVENOUS
  Filled 2020-07-01 (×8): qty 100

## 2020-07-01 MED ORDER — INSULIN ASPART 100 UNIT/ML ~~LOC~~ SOLN
0.0000 [IU] | Freq: Three times a day (TID) | SUBCUTANEOUS | Status: DC
  Administered 2020-07-02: 2 [IU] via SUBCUTANEOUS
  Administered 2020-07-02: 3 [IU] via SUBCUTANEOUS
  Administered 2020-07-04: 2 [IU] via SUBCUTANEOUS

## 2020-07-01 MED ORDER — ACETAMINOPHEN 650 MG RE SUPP: 650.0000 mg | Freq: Four times a day (QID) | RECTAL | Status: DC | PRN
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
## 2020-07-01 NOTE — ED Notes (Signed)
Infusing 2nd liter of NS per Dr. Kizzie Furnish.
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
## 2020-07-01 NOTE — Progress Notes (Signed)
RT NOTE: Pt arrived via EMS on CPAP due to complaints of SOB. CPAP was removed and pt was placed on NRB mask. Pt's WOB WNL, MD at bedside.

## 2020-07-01 NOTE — ED Notes (Signed)
Lab called, unable to perform Hgb A1C that was added on.  Need to re-order.

## 2020-07-01 NOTE — ED Triage Notes (Signed)
Pt arrived via EMS from home w/ c/o respiratory distress. Pt had increased shob at home this morning and is on 2L O2 at baseline. Pt went to go to the bathroom and was weak d/t shob per EMS and fell back into her chair. Pt reports generalized pain. EMS reports that upon arrival Fire had increased pt O2 flow to 15L d/t initial O2 sats in the 70's to 80's. Pt arrived to ED on Bipap.

## 2020-07-02 LAB — CBC
HCT: 33.7 % — ABNORMAL LOW (ref 36.0–46.0)
Hemoglobin: 9.5 g/dL — ABNORMAL LOW (ref 12.0–15.0)
MCH: 25.7 pg — ABNORMAL LOW (ref 26.0–34.0)
MCHC: 28.2 g/dL — ABNORMAL LOW (ref 30.0–36.0)
MCV: 91.1 fL (ref 80.0–100.0)
Platelets: 278 10*3/uL (ref 150–400)
RBC: 3.7 MIL/uL — ABNORMAL LOW (ref 3.87–5.11)
RDW: 14.6 % (ref 11.5–15.5)
WBC: 18.9 10*3/uL — ABNORMAL HIGH (ref 4.0–10.5)
nRBC: 0 % (ref 0.0–0.2)

## 2020-07-02 LAB — COMPREHENSIVE METABOLIC PANEL
ALT: 25 U/L (ref 0–44)
AST: 68 U/L — ABNORMAL HIGH (ref 15–41)
Albumin: 2.8 g/dL — ABNORMAL LOW (ref 3.5–5.0)
Alkaline Phosphatase: 75 U/L (ref 38–126)
Anion gap: 9 (ref 5–15)
BUN: 18 mg/dL (ref 8–23)
CO2: 27 mmol/L (ref 22–32)
Calcium: 8.2 mg/dL — ABNORMAL LOW (ref 8.9–10.3)
Chloride: 104 mmol/L (ref 98–111)
Creatinine, Ser: 1.58 mg/dL — ABNORMAL HIGH (ref 0.44–1.00)
GFR calc Af Amer: 40 mL/min — ABNORMAL LOW (ref >60)
GFR calc non Af Amer: 34 mL/min — ABNORMAL LOW (ref >60)
Glucose, Bld: 92 mg/dL (ref 70–99)
Potassium: 4.2 mmol/L (ref 3.5–5.1)
Sodium: 140 mmol/L (ref 135–145)
Total Bilirubin: 0.7 mg/dL (ref 0.3–1.2)
Total Protein: 6.4 g/dL — ABNORMAL LOW (ref 6.5–8.1)

## 2020-07-02 LAB — LACTIC ACID, PLASMA: Lactic Acid, Venous: 1.8 mmol/L (ref 0.5–1.9)

## 2020-07-02 LAB — CORTISOL-AM, BLOOD: Cortisol - AM: 11.5 ug/dL (ref 6.7–22.6)

## 2020-07-02 LAB — HEMOGLOBIN A1C
Hgb A1c MFr Bld: 5.9 % — ABNORMAL HIGH (ref 4.8–5.6)
Mean Plasma Glucose: 122.63 mg/dL

## 2020-07-02 LAB — GLUCOSE, CAPILLARY
Glucose-Capillary: 134 mg/dL — ABNORMAL HIGH (ref 70–99)
Glucose-Capillary: 120 mg/dL — ABNORMAL HIGH (ref 70–99)
Glucose-Capillary: 163 mg/dL — ABNORMAL HIGH (ref 70–99)

## 2020-07-02 LAB — HIV ANTIBODY (ROUTINE TESTING W REFLEX): HIV Screen 4th Generation wRfx: NONREACTIVE

## 2020-07-02 LAB — PROCALCITONIN: Procalcitonin: 1.67 ng/mL

## 2020-07-02 LAB — TROPONIN I (HIGH SENSITIVITY): Troponin I (High Sensitivity): <2 ng/L (ref <18)

## 2020-07-02 LAB — PROTIME-INR
INR: 1.6 — ABNORMAL HIGH (ref 0.8–1.2)
Prothrombin Time: 18.1 seconds — ABNORMAL HIGH (ref 11.4–15.2)

## 2020-07-02 LAB — CBG MONITORING, ED: Glucose-Capillary: 109 mg/dL — ABNORMAL HIGH (ref 70–99)

## 2020-07-02 MED ORDER — NYSTATIN 100000 UNIT/ML MT SUSP
5.0000 mL | Freq: Four times a day (QID) | OROMUCOSAL | Status: DC
  Administered 2020-07-02 – 2020-07-04 (×6): 500000 [IU] via ORAL
  Filled 2020-07-02 (×8): qty 5

## 2020-07-02 MED ORDER — SODIUM CHLORIDE 0.9 % IV SOLN
2.0000 g | INTRAVENOUS | Status: DC
  Administered 2020-07-02 – 2020-07-04 (×3): 2 g via INTRAVENOUS
  Filled 2020-07-02 (×4): qty 20

## 2020-07-02 MED ORDER — LACTATED RINGERS IV SOLN: INTRAVENOUS | Status: DC

## 2020-07-02 NOTE — ED Notes (Signed)
Pt still c.o back abd leg paibn  From her fall 2 days ago  She reports that she takes percocet xanax and neuronton  Whenever she is at home

## 2020-07-02 NOTE — ED Notes (Signed)
Report called to donna rn

## 2020-07-02 NOTE — Progress Notes (Signed)
PROGRESS NOTE    Teresa Wiley  JIR:[id number] DOB: [birth year] DOA: 07/01/2020 PCP: Secundino Ginger, PA-C  Outpatient Specialists:   Brief Narrative:  As per H&P done by Dr. Harlene Ramus "Teresa Wiley is a 64 y.o. female with medical history significant of DM, HTN, PAH, 2L O2 at baseline, chronic back pain.  Pt brought in to ED with respiratory distress.  Pt with increasing SOB since this AM.  Generalized weakness.  Had fall at home due to SOB and weakness today.  Seems symptoms onset yesterday.  Is having increased low back pain from baseline following fall.  Pt put on CPAP en-route to hospital.   ED Course: Pt with increased O2 requirement, now on HFNC in ED.  Pt with BPs 75Z-02H systolic.  Lab work reveals WBC 25k, AKI with creat 2.7 (normal at baseline).  CXR shows RLL PNA.  COVID is neg.  Lactate 1.9.  2L bolus, rocephin, doxycyline".  07/02/2020: Patient seen.  Patient is doing particularly good historian.  Patient continues to report shortness of breath, pain on swallowing, chills but no fever.  Patient reports coughing up blood-tinged sputum.  Patient also reported pain on swallowing.  No obvious thrush.   Assessment & Plan:   Principal Problem:   Severe sepsis with acute organ dysfunction (HCC) Active Problems:   HTN (hypertension)   Community acquired pneumonia of right lower lobe of lung   Chronic pain syndrome   Diabetes type 2, controlled ([place])   Pulmonary HTN ([place])   AKI (acute kidney injury) ([place])   Acute on chronic respiratory failure with hypoxia ([place])  1. Severe sepsis - 1. Continue treatment for possible pneumonia.   2. UA reveals large blood. 3. Cultures are pending. 4. Patient is on IV Rocephin and doxycycline. 5. Follow cultures.  2. AKI - 1. Etiology unclear. 2. Possibly prerenal. 3. Improving with hydration. 4. Avoid nephrotoxic's. 5. Dose medications assuming GFR of less than 15 mL/min per 1.73 m.   6. Strict intake and output 7. Renal  US is nonrevealing. 8. BMP in a.m.  3. Acute on chronic hypoxic resp failure - 1. O2 and cont pulse ox 2. Due to PNA 3. COVID negative 4. Acute on chronic low back pain - 1. R/o renal stone with [place] 2. X-ray lumbar spine is nonrevealing # 5. PAH / chronic diastolic CHF - 1. Holding home diuretics 2. Watch for fluid overload with resuscitation 6. HTN - 1. Holding all home BP meds in setting of hypotension  Odynophagia: Etiology unclear.  Trial of nystatin S/S.  Low threshold to consult GI if symptoms do not resolve.  DVT prophylaxis: Eliquis. Code Status: Full Family Communication:  Disposition Plan: This will depend on hospital course.   Consultants:   None  Procedures:   None  Antimicrobials:   IV Rocephin.  IV doxycycline.     Subjective: -Blood tinged sputum. -Chills. -Weakness. -Shortness of breath.  Objective: Vitals:   07/02/20 0750 07/02/20 1151 07/02/20 1153 07/02/20 1604  BP: 107/71 (!) 93/58 (!) 93/58 93/64  Pulse: 98 91 91 95  Resp: 17 16 16 15   Temp: 99.6 F (37.6 C) 98.3 F (36.8 C) 98.3 F (36.8 C) 98.2 F (36.8 C)  TempSrc: Oral Oral  Oral  SpO2: 91% 95%  93%  Weight:      Height:        Intake/Output Summary (Last 24 hours) at 07/02/2020 1735 Last data filed at 07/02/2020 1510 Gross per 24 hour  Intake 3201.25 ml  Output 975 ml  Net 2226.25 ml   Filed Weights   07/01/20 1827 07/02/20 0653  Weight: 115.2 kg 114.6 kg    Examination:  General exam: Appears calm and comfortable.  Dry buccal mucosa.  Is morbidly obese. Respiratory system: Decreased air entry.  No wheezing.   Cardiovascular system: S1 & S2  Gastrointestinal system: Obese, soft and nontender.  Organs are difficult to assess.   Central nervous system: Alert and oriented.  Patient moves all extremities.  Extremities: Mild leg edema  Data Reviewed: I have personally reviewed following labs and imaging studies  CBC: Recent Labs  Lab 07/01/20 1845 07/01/20 1851  07/02/20 0228  WBC 25.2*  --  18.9*  NEUTROABS 22.0*  --   --   HGB 10.6* 12.6 9.5*  HCT 36.8 37.0 33.7*  MCV 89.5  --  91.1  PLT 321  --  413   Basic Metabolic Panel: Recent Labs  Lab 07/01/20 1845 07/01/20 1851 07/01/20 1948 07/02/20 0228  NA 137 137 138 140  K 5.4* 5.5* 4.8 4.2  CL 98  --  100 104  CO2 26  --  27 27  GLUCOSE 130*  --  127* 92  BUN 22  --  22 18  CREATININE 2.68*  --  2.74* 1.58*  CALCIUM 8.8*  --  8.5* 8.2*   GFR: Estimated Creatinine Clearance: 49.2 mL/min (A) (by C-G formula based on SCr of 1.58 mg/dL (H)). Liver Function Tests: Recent Labs  Lab 07/01/20 1948 07/02/20 0228  AST 41 68*  ALT 20 25  ALKPHOS 76 75  BILITOT 0.9 0.7  PROT 6.6 6.4*  ALBUMIN 3.0* 2.8*   No results for input(s): LIPASE, AMYLASE in the last 168 hours. No results for input(s): AMMONIA in the last 168 hours. Coagulation Profile: Recent Labs  Lab 07/01/20 1948 07/02/20 0228  INR 1.5* 1.6*   Cardiac Enzymes: No results for input(s): CKTOTAL, CKMB, CKMBINDEX, TROPONINI in the last 168 hours. BNP (last 3 results) No results for input(s): PROBNP in the last 8760 hours. HbA1C: Recent Labs    07/02/20 0228  HGBA1C 5.9*   CBG: Recent Labs  Lab 07/02/20 0536 07/02/20 0657 07/02/20 1113 07/02/20 1615  GLUCAP 109* 134* 120* 163*   Lipid Profile: No results for input(s): CHOL, HDL, LDLCALC, TRIG, CHOLHDL, LDLDIRECT in the last 72 hours. Thyroid Function Tests: No results for input(s): TSH, T4TOTAL, FREET4, T3FREE, THYROIDAB in the last 72 hours. Anemia Panel: No results for input(s): VITAMINB12, FOLATE, FERRITIN, TIBC, IRON, RETICCTPCT in the last 72 hours. Urine analysis:    Component Value Date/Time   COLORURINE YELLOW 07/01/2020 2145   APPEARANCEUR CLEAR 07/01/2020 2145   LABSPEC 1.010 07/01/2020 2145   PHURINE 5.0 07/01/2020 2145   GLUCOSEU NEGATIVE 07/01/2020 2145   HGBUR LARGE (A) 07/01/2020 2145   BILIRUBINUR NEGATIVE 07/01/2020 2145   KETONESUR  NEGATIVE 07/01/2020 2145   PROTEINUR NEGATIVE 07/01/2020 2145   UROBILINOGEN 1.0 12/07/2014 0200   NITRITE NEGATIVE 07/01/2020 2145   LEUKOCYTESUR MODERATE (A) 07/01/2020 2145   Sepsis Labs: @LABRCNTIP (procalcitonin:4,lacticidven:4)  ) Recent Results (from the past 240 hour(s))  SARS Coronavirus 2 by RT PCR (hospital order, performed in [facility] hospital lab) Nasopharyngeal Nasopharyngeal Swab     Status: None   Collection Time: 07/01/20  6:45 PM   Specimen: Nasopharyngeal Swab  Result Value Ref Range Status   SARS Coronavirus 2 NEGATIVE NEGATIVE Final    Comment: (NOTE) SARS-CoV-2 target nucleic acids are NOT DETECTED.  The SARS-CoV-2  RNA is generally detectable in upper and lower respiratory specimens during the acute phase of infection. The lowest concentration of SARS-CoV-2 viral copies this assay can detect is 250 copies / mL. A negative result does not preclude SARS-CoV-2 infection and should not be used as the sole basis for treatment or other patient management decisions.  A negative result may occur with improper specimen collection / handling, submission of specimen other than nasopharyngeal swab, presence of viral mutation(s) within the areas targeted by this assay, and inadequate number of viral copies (<250 copies / mL). A negative result must be combined with clinical observations, patient history, and epidemiological information.  Fact Sheet for Patients:   StrictlyIdeas.no  Fact Sheet for Healthcare Providers: BankingDealers.co.za  This test is not yet approved or  cleared by the [place] FDA and has been authorized for detection and/or diagnosis of SARS-CoV-2 by FDA under an Emergency Use Authorization (EUA).  This EUA will remain in effect (meaning this test can be used) for the duration of the COVID-19 declaration under Section 564(b)(1) of the Act, 21 U.S.C. section 360bbb-3(b)(1), unless the  authorization is terminated or revoked sooner.  Performed at [facility], [place] [address], [place], [place] [address]   Blood Culture (routine x 2)     Status: None (Preliminary result)   Collection Time: 07/01/20  7:48 PM   Specimen: BLOOD  Result Value Ref Range Status   Specimen Description BLOOD LEFT ANTECUBITAL  Final   Special Requests   Final    BOTTLES DRAWN AEROBIC AND ANAEROBIC Blood Culture adequate volume   Culture   Final    NO GROWTH < 24 HOURS Performed at [facility], [place] [address], [place], [place] [address]    Report Status PENDING  Incomplete  Blood Culture (routine x 2)     Status: None (Preliminary result)   Collection Time: 07/01/20  7:54 PM   Specimen: BLOOD LEFT FOREARM  Result Value Ref Range Status   Specimen Description BLOOD LEFT FOREARM  Final   Special Requests   Final    BOTTLES DRAWN AEROBIC AND ANAEROBIC Blood Culture results may not be optimal due to an inadequate volume of blood received in culture bottles   Culture   Final    NO GROWTH < 24 HOURS Performed at [facility], [place] [address] [place], [place] [address]    Report Status PENDING  Incomplete         Radiology Studies: DG Lumbar Spine 2-3 Views  Result Date: 07/01/2020 CLINICAL DATA:  Fall with lower back pain EXAM: LUMBAR SPINE - 2-3 VIEW COMPARISON:  None. FINDINGS: There is no evidence of lumbar spine fracture. Alignment is normal. Mild disc height loss is seen at L5-S1 with facet arthrosis. IMPRESSION: No acute fracture or malalignment. Electronically Signed   By: Prudencio Pair M.D.   On: 07/01/2020 23:20   US RENAL  Result Date: 07/01/2020 CLINICAL DATA:  Acute renal insufficiency. EXAM: RENAL / URINARY TRACT ULTRASOUND COMPLETE COMPARISON:  None. FINDINGS: Right Kidney: Renal measurements: 12.31 cm x 5.64 cm x 4.66 cm = volume: 169.40 mL . Echogenicity within normal limits. No mass or hydronephrosis visualized. Left Kidney: Renal measurements: 10.85 cm x  5.90 cm x 5.37 cm = volume: 179.99 mL. Echogenicity within normal limits. No mass or hydronephrosis visualized. Bladder: Appears normal for degree of bladder distention. Bilateral ureteral jets are visualized. Other: None. IMPRESSION: Normal renal ultrasound. Electronically Signed   By: Hoover Browns  [place] M.D.   On: 07/01/2020 23:49   DG Chest Portable 1 View  Result Date: 07/01/2020 CLINICAL DATA:  Shortness of breath. EXAM: PORTABLE CHEST 1 VIEW COMPARISON:  February 08, 2019 FINDINGS: There are multiple overlying radiopaque cardiac lead wires. Mild to moderate severity atelectasis and/or infiltrate is seen within the right lung base. Small bilateral pleural effusions are seen. No pneumothorax is identified. The heart size and mediastinal contours are within normal limits. The visualized skeletal structures are unremarkable. IMPRESSION: 1. Mild to moderate severity right basilar atelectasis and/or infiltrate. 2. Small bilateral pleural effusions. Electronically Signed   By: Virgina Norfolk M.D.   On: 07/01/2020 18:36        Scheduled Meds: . ALPRAZolam  1 mg Oral TID  . apixaban  5 mg Oral BID  . insulin aspart  0-15 Units Subcutaneous TID WC  . insulin aspart  0-5 Units Subcutaneous QHS  . pantoprazole  40 mg Oral Daily   Continuous Infusions: . cefTRIAXone (ROCEPHIN)  IV    . doxycycline (VIBRAMYCIN) IV Stopped (07/02/20 1118)  . lactated ringers Stopped (07/02/20 0005)  . lactated ringers 75 mL/hr at 07/02/20 1509     LOS: 1 day    Time spent: 35 minutes    Dana Allan, MD  [facility] Pager #: [phone] 7PM-7AM contact night coverage as above

## 2020-07-03 LAB — CBC WITH DIFFERENTIAL/PLATELET
Abs Immature Granulocytes: 0.02 10*3/uL (ref 0.00–0.07)
Basophils Absolute: 0 10*3/uL (ref 0.0–0.1)
Basophils Relative: 0 %
Eosinophils Absolute: 0.2 10*3/uL (ref 0.0–0.5)
Eosinophils Relative: 2 %
HCT: 34 % — ABNORMAL LOW (ref 36.0–46.0)
Hemoglobin: 9.7 g/dL — ABNORMAL LOW (ref 12.0–15.0)
Immature Granulocytes: 0 %
Lymphocytes Relative: 16 %
Lymphs Abs: 1.4 10*3/uL (ref 0.7–4.0)
MCH: 25.7 pg — ABNORMAL LOW (ref 26.0–34.0)
MCHC: 28.5 g/dL — ABNORMAL LOW (ref 30.0–36.0)
MCV: 90.2 fL (ref 80.0–100.0)
Monocytes Absolute: 0.7 10*3/uL (ref 0.1–1.0)
Monocytes Relative: 8 %
Neutro Abs: 6.5 10*3/uL (ref 1.7–7.7)
Neutrophils Relative %: 74 %
Platelets: 226 10*3/uL (ref 150–400)
RBC: 3.77 MIL/uL — ABNORMAL LOW (ref 3.87–5.11)
RDW: 14.6 % (ref 11.5–15.5)
WBC: 8.9 10*3/uL (ref 4.0–10.5)
nRBC: 0 % (ref 0.0–0.2)

## 2020-07-03 LAB — URINE CULTURE

## 2020-07-03 LAB — GLUCOSE, CAPILLARY
Glucose-Capillary: 108 mg/dL — ABNORMAL HIGH (ref 70–99)
Glucose-Capillary: 110 mg/dL — ABNORMAL HIGH (ref 70–99)
Glucose-Capillary: 90 mg/dL (ref 70–99)
Glucose-Capillary: 92 mg/dL (ref 70–99)
Glucose-Capillary: 130 mg/dL — ABNORMAL HIGH (ref 70–99)

## 2020-07-03 LAB — BASIC METABOLIC PANEL
Anion gap: 12 (ref 5–15)
BUN: 8 mg/dL (ref 8–23)
CO2: 25 mmol/L (ref 22–32)
Calcium: 9 mg/dL (ref 8.9–10.3)
Chloride: 105 mmol/L (ref 98–111)
Creatinine, Ser: 0.78 mg/dL (ref 0.44–1.00)
GFR calc Af Amer: >60 mL/min (ref >60)
GFR calc non Af Amer: >60 mL/min (ref >60)
Glucose, Bld: 94 mg/dL (ref 70–99)
Potassium: 5.5 mmol/L — ABNORMAL HIGH (ref 3.5–5.1)
Sodium: 142 mmol/L (ref 135–145)

## 2020-07-03 MED ORDER — BACLOFEN 10 MG PO TABS
10.0000 mg | ORAL_TABLET | Freq: Three times a day (TID) | ORAL | Status: DC
  Administered 2020-07-03 – 2020-07-05 (×7): 10 mg via ORAL
  Filled 2020-07-03 (×9): qty 1

## 2020-07-03 MED ORDER — GABAPENTIN 600 MG PO TABS
600.0000 mg | ORAL_TABLET | Freq: Three times a day (TID) | ORAL | Status: DC
  Administered 2020-07-03 – 2020-07-05 (×7): 600 mg via ORAL
  Filled 2020-07-03 (×7): qty 1

## 2020-07-03 MED ORDER — CARVEDILOL 12.5 MG PO TABS
12.5000 mg | ORAL_TABLET | Freq: Two times a day (BID) | ORAL | Status: DC
  Administered 2020-07-03 – 2020-07-05 (×5): 12.5 mg via ORAL
  Filled 2020-07-03 (×5): qty 1

## 2020-07-03 MED ORDER — DIPHENHYDRAMINE HCL 25 MG PO CAPS
50.0000 mg | ORAL_CAPSULE | Freq: Every evening | ORAL | Status: DC | PRN
  Filled 2020-07-03: qty 2

## 2020-07-03 MED ORDER — OXYCODONE-ACETAMINOPHEN 10-325 MG PO TABS: 1.0000 | ORAL_TABLET | Freq: Four times a day (QID) | ORAL | Status: DC | PRN

## 2020-07-03 MED ORDER — OXYCODONE HCL 5 MG PO TABS
5.0000 mg | ORAL_TABLET | Freq: Four times a day (QID) | ORAL | Status: DC | PRN
  Administered 2020-07-03 – 2020-07-05 (×6): 5 mg via ORAL
  Filled 2020-07-03 (×6): qty 1

## 2020-07-03 MED ORDER — OXYCODONE-ACETAMINOPHEN 5-325 MG PO TABS
1.0000 | ORAL_TABLET | Freq: Four times a day (QID) | ORAL | Status: DC | PRN
  Administered 2020-07-03 – 2020-07-05 (×6): 1 via ORAL
  Filled 2020-07-03 (×7): qty 1

## 2020-07-03 NOTE — Progress Notes (Signed)
PT Cancellation Note  Patient Details Name: Teresa Wiley MRN: [id number] DOB: 11-12-1956   Cancelled Treatment:    Reason Eval/Treat Not Completed: Fatigue/lethargy limiting ability to participate. Pt was fatigued and requested rest when therapy entered the room. Requesting for PT to come back later. Will follow up as schedule allows.  Gloriann Loan, SPT  Acute Rehabilitation Services  Office: [phone]  07/03/2020, 11:01 AM

## 2020-07-03 NOTE — Progress Notes (Signed)
Patient's significant other, Ruby, would like an update from a physician. Her best contact number is [phone].

## 2020-07-03 NOTE — Progress Notes (Signed)
PROGRESS NOTE  Teresa Wiley  DOB: [birth year]  PCP: Secundino Ginger, PA-C ZJI:[id number]  DOA: 07/01/2020  LOS: 2 days   Chief Complaint  Patient presents with  . Respiratory Distress   Brief narrative: Teresa Wiley is a 64 y.o. female with PMH of DM2, HTN, pulmonary hypertension, chronic back pain, degenerative disc disease, anxiety, GERD. Patient presented to the ED on 07/01/2020 with complaint of increasing shortness of breath, generalized weakness, leading to fall and worsening of her chronic back pain.  In the ED, patient was placed on high flow oxygen nasal cannula.  Blood pressure was low in 80s. Labs with WBC count elevated to 25,000, creatinine elevated to 2.7, normal at baseline, lactate 1.9 Chest x-ray showed infiltrates in the right lung base. COVID-19 PCR negative. Patient was started on IV Rocephin, IV doxycycline, IV fluid and admitted under hospitalist service for severe sepsis secondary to pneumonia.  Subjective: Patient was seen and examined this morning.  Pleasant middle-aged African-American female.  Anxious mostly because of difficulty swallowing. Remains on 10 L high flow oxygen to nasal cannula. Reports difficulty swallowing with solids but not with liquid.  Yesterday reported blood in sputum, does not pursue this morning. No fever last 24 hours.  Heart rate in 80s and 90s, blood pressure in 140s this morning.  Assessment/Plan: Severe sepsis - POA Right lower lobe pneumonia -Presented with shortness of breath, generalized weakness -Chest x-ray with right lower lobe pneumonia. -WBC was 25,000, trending down.  No fever last 24 hours. -Currently on IV Rocephin, IV doxycycline. -Initially hypotensive, blood pressure improving with IV fluid.  Acute on chronic hypoxic respiratory failure -Secondary to pneumonia.  COVID-19 negative. -Uses 2 to 3 L oxygen at home -Currently requiring 10 L by high flow nasal cannula. -Continue to wean  down.  Dysphagia History of GERD -Reports dysphagia with solid food. -Consult speech therapy. -Continue PPI.  Acute kidney injury -Secondary to sepsis.  Creatinine elevated to 2.7, normal at baseline -With hydration, creatinine is back to normal now. -Offending meds on hold. -Renal US is nonrevealing.  Hyperkalemia -Unclear why potassium is elevated to 5.5 today.  Not on potassium supplement.  Continue to monitor.  Hypotensive on admission History of hypertension History of pulmonary hypertension -Initially hypotensive in the ED requiring IV fluid. -Currently remains on LR at 75 mL/h. -Home meds include Coreg 12.5 mg twice daily, Lasix 40 mg daily, lisinopril 2.5 mg daily -Resume Coreg this morning.  Keep Lasix and lisinopril on hold.  Stop fluid today.  Hopefully start Lasix tomorrow. DM2 - A1c 5.9 -Home meds include Metformin 5 mg twice daily, Ozempic 1 mg weekly -Continue sliding scale with Accu-Cheks.  Abnormal urinalysis -Urinalysis reports large amount of hemoglobin, moderate amount of leukocytes and rare bacteria.  -No significant growth in culture. ?indication - currently on Eliquis 5 mg twice daily  Acute on chronic low back pain History of degenerative disc disease -normal renal ultrasound and x-ray of lumbar spine. -Back pain worsened probably because of fall. -Home meds include baclofen 10 mg 3 times daily, Neurontin 600 mg 3 times daily, Percocet 10/325 every 6 hours as needed -Resume all today.  Watch out for mental status impairment in the face of acute kidney injury.  We will hold medications if mental status or kidney injury gets worse  Fall/generalized weakness -PT eval.  Anxiety/insomnia  -Home meds include Xanax 1 mg 3 times daily and Benadryl 25 mg at bedtime for sleep. -Resume both.   Mobility: PT eval pending. Code  Status:   Code Status: Full Code  Nutritional status: Body mass index is 37.32 kg/m.     Diet Order            Diet Carb  Modified Fluid consistency: Thin; Room service appropriate? Yes  Diet effective now                 DVT prophylaxis:  apixaban (ELIQUIS) tablet 5 mg   Antimicrobials:  IV Rocephin and IV doxycycline Fluid: Stop fluid today  Consultants: None Family Communication:  None at bedside  Status is: Inpatient  Remains inpatient appropriate because:Ongoing diagnostic testing needed not appropriate for outpatient work up and IV treatments appropriate due to intensity of illness or inability to take PO   Dispo: The patient is from: Home              Anticipated d/c is to: Home hopefully              Anticipated d/c date is: 2 days              Patient currently is not medically stable to d/c.       Infusions:  . cefTRIAXone (ROCEPHIN)  IV 2 g (07/02/20 2020)  . doxycycline (VIBRAMYCIN) IV 100 mg (07/03/20 0800)  . lactated ringers 75 mL/hr at 07/03/20 0600    Scheduled Meds: . ALPRAZolam  1 mg Oral TID  . apixaban  5 mg Oral BID  . insulin aspart  0-15 Units Subcutaneous TID WC  . insulin aspart  0-5 Units Subcutaneous QHS  . nystatin  5 mL Oral QID  . pantoprazole  40 mg Oral Daily    Antimicrobials: Anti-infectives (From admission, onward)   Start     Dose/Rate Route Frequency Ordered Stop   07/02/20 2000  cefTRIAXone (ROCEPHIN) 2 g in sodium chloride 0.9 % 100 mL IVPB     Discontinue     2 g 200 mL/hr over 30 Minutes Intravenous Every 24 hours 07/02/20 0650     07/01/20 1945  cefTRIAXone (ROCEPHIN) 2 g in sodium chloride 0.9 % 100 mL IVPB  Status:  Discontinued        2 g 200 mL/hr over 30 Minutes Intravenous Every 24 hours 07/01/20 1933 07/02/20 0650   07/01/20 1945  doxycycline (VIBRAMYCIN) 100 mg in sodium chloride 0.9 % 250 mL IVPB     Discontinue     100 mg 125 mL/hr over 120 Minutes Intravenous Every 12 hours 07/01/20 1933        PRN meds: acetaminophen **OR** acetaminophen, HYDROmorphone (DILAUDID) injection, ondansetron **OR** ondansetron (ZOFRAN) IV    Objective: Vitals:   07/03/20 0519 07/03/20 0818  BP: (!) 164/98 (!) 146/87  Pulse: 98 91  Resp: 17 17  Temp: 99.1 F (37.3 C) 98.8 F (37.1 C)  SpO2: 92% 97%    Intake/Output Summary (Last 24 hours) at 07/03/2020 0949 Last data filed at 07/03/2020 0600 Gross per 24 hour  Intake 2026.25 ml  Output 1575 ml  Net 451.25 ml   Filed Weights   07/01/20 1827 07/02/20 0653  Weight: 115.2 kg 114.6 kg   Weight change:  Body mass index is 37.32 kg/m.   Physical Exam: General exam: Appears calm and comfortable.  Mild to moderate distress because of acute on chronic back pain Skin: No rashes, lesions or ulcers. HEENT: Atraumatic, normocephalic, supple neck, no obvious bleeding Lungs: Clear to auscultation bilaterally CVS: Regular rate and rhythm, no murmur GI/Abd soft, nontender, nondistended, bowel sound present  CNS: Alert, awake, oriented x3 Psychiatry: Seems anxious Extremities: No pedal edema, no calf tenderness  Data Review: I have personally reviewed the laboratory data and studies available.  Recent Labs  Lab 07/01/20 1845 07/01/20 1851 07/02/20 0228  WBC 25.2*  --  18.9*  NEUTROABS 22.0*  --   --   HGB 10.6* 12.6 9.5*  HCT 36.8 37.0 33.7*  MCV 89.5  --  91.1  PLT 321  --  278   Recent Labs  Lab 07/01/20 1845 07/01/20 1851 07/01/20 1948 07/02/20 0228  NA 137 137 138 140  K 5.4* 5.5* 4.8 4.2  CL 98  --  100 104  CO2 26  --  27 27  GLUCOSE 130*  --  127* 92  BUN 22  --  22 18  CREATININE 2.68*  --  2.74* 1.58*  CALCIUM 8.8*  --  8.5* 8.2*   Lab Results  Component Value Date   HGBA1C 5.9 (H) 07/02/2020       Component Value Date/Time   CHOL 141 01/11/2016 0222   TRIG 90 01/11/2016 0222   HDL 39 (L) 01/11/2016 0222   CHOLHDL 3.6 01/11/2016 0222   VLDL 18 01/11/2016 0222   LDLCALC 84 01/11/2016 0222    Signed, Terrilee Croak, MD [facility] Pager: [phone] (Secure Chat preferred). 07/03/2020

## 2020-07-03 NOTE — Plan of Care (Signed)
Pt. Verbalizes and understands the importance of TCDB

## 2020-07-04 ENCOUNTER — Inpatient Hospital Stay (HOSPITAL_COMMUNITY): Payer: Medicaid Other

## 2020-07-04 LAB — BASIC METABOLIC PANEL
Anion gap: 10 (ref 5–15)
BUN: 7 mg/dL — ABNORMAL LOW (ref 8–23)
CO2: 28 mmol/L (ref 22–32)
Calcium: 9 mg/dL (ref 8.9–10.3)
Chloride: 104 mmol/L (ref 98–111)
Creatinine, Ser: 0.73 mg/dL (ref 0.44–1.00)
GFR calc Af Amer: >60 mL/min (ref >60)
GFR calc non Af Amer: >60 mL/min (ref >60)
Glucose, Bld: 92 mg/dL (ref 70–99)
Potassium: 4.5 mmol/L (ref 3.5–5.1)
Sodium: 142 mmol/L (ref 135–145)

## 2020-07-04 LAB — CBC WITH DIFFERENTIAL/PLATELET
Abs Immature Granulocytes: 0.03 10*3/uL (ref 0.00–0.07)
Basophils Absolute: 0 10*3/uL (ref 0.0–0.1)
Basophils Relative: 0 %
Eosinophils Absolute: 0.2 10*3/uL (ref 0.0–0.5)
Eosinophils Relative: 3 %
HCT: 31.4 % — ABNORMAL LOW (ref 36.0–46.0)
Hemoglobin: 9 g/dL — ABNORMAL LOW (ref 12.0–15.0)
Immature Granulocytes: 0 %
Lymphocytes Relative: 20 %
Lymphs Abs: 1.4 10*3/uL (ref 0.7–4.0)
MCH: 25.3 pg — ABNORMAL LOW (ref 26.0–34.0)
MCHC: 28.7 g/dL — ABNORMAL LOW (ref 30.0–36.0)
MCV: 88.2 fL (ref 80.0–100.0)
Monocytes Absolute: 0.7 10*3/uL (ref 0.1–1.0)
Monocytes Relative: 10 %
Neutro Abs: 4.8 10*3/uL (ref 1.7–7.7)
Neutrophils Relative %: 67 %
Platelets: 281 10*3/uL (ref 150–400)
RBC: 3.56 MIL/uL — ABNORMAL LOW (ref 3.87–5.11)
RDW: 14.5 % (ref 11.5–15.5)
WBC: 7.1 10*3/uL (ref 4.0–10.5)
nRBC: 0 % (ref 0.0–0.2)

## 2020-07-04 LAB — GLUCOSE, CAPILLARY
Glucose-Capillary: 113 mg/dL — ABNORMAL HIGH (ref 70–99)
Glucose-Capillary: 90 mg/dL (ref 70–99)
Glucose-Capillary: 128 mg/dL — ABNORMAL HIGH (ref 70–99)

## 2020-07-04 MED ORDER — DOXYCYCLINE HYCLATE 100 MG PO TABS
100.0000 mg | ORAL_TABLET | Freq: Two times a day (BID) | ORAL | Status: DC
  Administered 2020-07-04 – 2020-07-05 (×2): 100 mg via ORAL
  Filled 2020-07-04 (×2): qty 1

## 2020-07-04 NOTE — Progress Notes (Signed)
Modified Barium Swallow Progress Note  Patient Details  Name: Teresa Wiley MRN: [id number] Date of Birth: [birth year]  Today's Date: 07/04/2020  Modified Barium Swallow completed.  Full report located under Chart Review in the Imaging Section.  Brief recommendations include the following:  Clinical Impression  Pt was seen in radiology suite for modified barium swallow study. Trials of puree solids, regular texture solids, a 53mm barium tablet, and thin liquids via cup and straw were administered. Pt demonstrated piecemeal deglutition with a large bolus of thin liquids, transient penetration (PAS 2) with thin liquids, and penetration (PAS 3) with the barium tablet which was taken with thin liquids. It is noteworthy that the pt propelled the barium tablet into the valleculae prior to taking a liquid and she indicated that this is how she typically takes her pills when they become repositioned horizontally in her throat and "stick". Overall, pt's swallow mechanism was within functional limits. Pt indicated that her dentures will be brought to the hospital today and that she would therefore prefer that her diet not be modified as yet. A regular texture diet with thin liquids is therefore recommended. SLP will see patient once more to ensure tolerance of the diet; however, further skilled SLP services will likely not be needed beyond that.    Swallow Evaluation Recommendations       SLP Diet Recommendations: Regular solids;Thin liquid   Liquid Administration via: Cup;Straw   Medication Administration: Whole meds with puree (cut smaller as requested by pt)   Supervision: Staff to assist with self feeding   Compensations: Slow rate;Small sips/bites   Postural Changes: Remain semi-upright after after feeds/meals (Comment);Seated upright at 90 degrees   Oral Care Recommendations: Oral care BID      Lavenia Stumpo I. Hardin Negus, [place], [place] Office number  [phone] Pager [phone]   Teresa Wiley 07/04/2020,1:51 PM
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
## 2020-07-04 NOTE — Progress Notes (Signed)
@  approx 0400, during VS check and pain med administration pt endorsed difficulty swallowing stating "it feels like the top of my mouth is swollen". Oral assessment completed and no swelling observable. Pt took pain medications whole with applesauce without complication. Pt left in upright position post oral intake to reduce aspiration risk. Will convey to Day RN and continue to monitor and assess.
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
## 2020-07-04 NOTE — Progress Notes (Signed)
PROGRESS NOTE  Teresa Wiley  DOB: 12-Feb-1956  PCP: Secundino Ginger, PA-C [id number]  DOA: 07/01/2020  LOS: 3 days   Chief Complaint  Patient presents with  . Respiratory Distress   Brief narrative: Teresa Wiley is a 64 y.o. female with PMH of DM2, HTN, pulmonary hypertension, chronic back pain, degenerative disc disease, anxiety, GERD. Patient presented to the ED on 07/01/2020 with complaint of increasing shortness of breath, generalized weakness, leading to fall and worsening of her chronic back pain.  In the ED, patient was placed on high flow oxygen nasal cannula.  Blood pressure was low in 80s. Labs with WBC count elevated to 25,000, creatinine elevated to 2.7, normal at baseline, lactate 1.9 Chest x-ray showed infiltrates in the right lung base. COVID-19 PCR negative. Patient was started on IV Rocephin, IV doxycycline, IV fluid and admitted under hospitalist service for severe sepsis secondary to pneumonia.  Subjective: Patient was seen and examined this morning.   Sitting up in bed.  Her wife of 31 years is on the phone.   On 3 L oxygen by nasal cannula this morning.   Seen by PT and speech therapy today.   Home health PT recommended.  Modified barium swallow planned for this afternoon.    Assessment/Plan: Severe sepsis - POA Right lower lobe pneumonia -Presented with shortness of breath, generalized weakness -Chest x-ray with right lower lobe pneumonia. -Currently on IV Rocephin, IV doxycycline.  Okay to switch to oral doxycycline today. -WBC was 25,000, trending down, down to normal today.  No fever last 48 hours. -Initially hypotensive, blood pressure improved with IV fluid.  Currently not on IV fluid.  Acute on chronic hypoxic respiratory failure -Secondary to pneumonia.  COVID-19 negative. -Uses 2 to 3 L oxygen at home -Initially required up to 10 L by high flow nasal cannula. -Currently on 3 L by nasal cannula.  Dysphagia History of GERD -Reports  dysphagia with solid food. -Plan for modified barium swallow today. -Continue PPI.  Acute kidney injury -Secondary to sepsis. Creatinine elevated to 2.7, normal at baseline -With hydration, creatinine is back to normal now. -Offending meds on hold. -Renal US is nonrevealing. -I will resume Lasix this morning.  Hyperkalemia -Unclear why potassium was elevated to 5.5 yesterday.  Not on potassium supplement.  Repeat potassium today is normal at 4.5.  Hypotensive on admission History of hypertension History of pulmonary hypertension -Initially hypotensive in the ED and required IV fluid.  Blood pressure improved.  IV fluid stopped.   -Home meds include Coreg 12.5 mg twice daily, Lasix 40 mg daily, lisinopril 2.5 mg daily -Currently on Coreg.  Resume Lasix this morning.  Lisinopril remains on hold.  DM2 - A1c 5.9 -Home meds include Metformin 5 mg twice daily, Ozempic 1 mg weekly -Continue sliding scale with Accu-Cheks.  Chronic anemia -Hemoglobin at baseline between 9-10.  Abnormal urinalysis -Urinalysis reports large amount of hemoglobin, moderate amount of leukocytes and rare bacteria.  -No significant growth in culture. ?indication - currently on Eliquis 5 mg twice daily  Acute on chronic low back pain History of degenerative disc disease -normal renal ultrasound and x-ray of lumbar spine. -Back pain worsened probably because of fall. -Home meds include baclofen 10 mg 3 times daily, Neurontin 600 mg 3 times daily, Percocet 10/325 every 6 hours as needed -Resumed all home meds.  Mental status and renal function remained stable.  Fall/generalized weakness -PT eval.  Anxiety/insomnia  -Home meds include Xanax 1 mg 3 times daily and Benadryl  25 mg at bedtime for sleep. -Resume both.   Mobility: PT eval obtained.  Home health PT recommended. Code Status:   Code Status: Full Code  Nutritional status: Body mass index is 37.32 kg/m.     Diet Order            Diet Carb  Modified Fluid consistency: Thin; Room service appropriate? Yes  Diet effective now                 DVT prophylaxis:  apixaban (ELIQUIS) tablet 5 mg   Antimicrobials:  IV Rocephin and doxycycline Fluid: Stop fluid today  Consultants: None Family Communication:  None at bedside  Status is: Inpatient  Remains inpatient appropriate because:Ongoing diagnostic testing needed not appropriate for outpatient work up and IV treatments appropriate due to intensity of illness or inability to take PO  Dispo: The patient is from: Home              Anticipated d/c is to: Home hopefully tomorrow.              Anticipated d/c date is: Tomorrow              Patient currently is not medically stable to d/c.  Anticipate discharge to home tomorrow.   Infusions:  . cefTRIAXone (ROCEPHIN)  IV Stopped (07/03/20 2247)    Scheduled Meds: . ALPRAZolam  1 mg Oral TID  . apixaban  5 mg Oral BID  . baclofen  10 mg Oral TID  . carvedilol  12.5 mg Oral BID WC  . doxycycline  100 mg Oral Q12H  . gabapentin  600 mg Oral TID  . insulin aspart  0-15 Units Subcutaneous TID WC  . insulin aspart  0-5 Units Subcutaneous QHS  . nystatin  5 mL Oral QID  . pantoprazole  40 mg Oral Daily    Antimicrobials: Anti-infectives (From admission, onward)   Start     Dose/Rate Route Frequency Ordered Stop   07/04/20 2200  doxycycline (VIBRA-TABS) tablet 100 mg     Discontinue     100 mg Oral Every 12 hours 07/04/20 1042     07/02/20 2000  cefTRIAXone (ROCEPHIN) 2 g in sodium chloride 0.9 % 100 mL IVPB     Discontinue     2 g 200 mL/hr over 30 Minutes Intravenous Every 24 hours 07/02/20 0650     07/01/20 1945  cefTRIAXone (ROCEPHIN) 2 g in sodium chloride 0.9 % 100 mL IVPB  Status:  Discontinued        2 g 200 mL/hr over 30 Minutes Intravenous Every 24 hours 07/01/20 1933 07/02/20 0650   07/01/20 1945  doxycycline (VIBRAMYCIN) 100 mg in sodium chloride 0.9 % 250 mL IVPB  Status:  Discontinued        100 mg 125  mL/hr over 120 Minutes Intravenous Every 12 hours 07/01/20 1933 07/04/20 1042      PRN meds: acetaminophen **OR** acetaminophen, diphenhydrAMINE, ondansetron **OR** ondansetron (ZOFRAN) IV, oxyCODONE-acetaminophen **AND** oxyCODONE   Objective: Vitals:   07/04/20 0405 07/04/20 0845  BP: 133/90 (!) 159/98  Pulse: 88 92  Resp: 12 15  Temp: 97.6 F (36.4 C) 97.8 F (36.6 C)  SpO2: 100%     Intake/Output Summary (Last 24 hours) at 07/04/2020 1053 Last data filed at 07/04/2020 0405 Gross per 24 hour  Intake 1210 ml  Output 2000 ml  Net -790 ml   Filed Weights   07/01/20 1827 07/02/20 0653  Weight: 115.2 kg 114.6 kg  Weight change:  Body mass index is 37.32 kg/m.   Physical Exam: General exam: Appears calm and comfortable.  Not in distress.   Skin: No rashes, lesions or ulcers. HEENT: Atraumatic, normocephalic, supple neck, no obvious bleeding.  Oral exam normal. Lungs: Clear to auscultation bilaterally CVS: Regular rate and rhythm, no murmur GI/Abd soft, nontender, nondistended, bowel sound present CNS: Alert, awake, oriented x3 Psychiatry: Mood appropriate Extremities: No pedal edema, no calf tenderness  Data Review: I have personally reviewed the laboratory data and studies available.  Recent Labs  Lab 07/01/20 1845 07/01/20 1851 07/02/20 0228 07/03/20 0954 07/04/20 0123  WBC 25.2*  --  18.9* 8.9 7.1  NEUTROABS 22.0*  --   --  6.5 4.8  HGB 10.6* 12.6 9.5* 9.7* 9.0*  HCT 36.8 37.0 33.7* 34.0* 31.4*  MCV 89.5  --  91.1 90.2 88.2  PLT 321  --  278 226 281   Recent Labs  Lab 07/01/20 1845 07/01/20 1845 07/01/20 1851 07/01/20 1948 07/02/20 0228 07/03/20 0954 07/04/20 0123  NA 137   < > 137 138 140 142 142  K 5.4*   < > 5.5* 4.8 4.2 5.5* 4.5  CL 98  --   --  100 104 105 104  CO2 26  --   --  27 27 25 28   GLUCOSE 130*  --   --  127* 92 94 92  BUN 22  --   --  22 18 8  7*  CREATININE 2.68*  --   --  2.74* 1.58* 0.78 0.73  CALCIUM 8.8*  --   --  8.5*  8.2* 9.0 9.0   < > = values in this interval not displayed.   Lab Results  Component Value Date   HGBA1C 5.9 (H) 07/02/2020       Component Value Date/Time   CHOL 141 01/11/2016 0222   TRIG 90 01/11/2016 0222   HDL 39 (L) 01/11/2016 0222   CHOLHDL 3.6 01/11/2016 0222   VLDL 18 01/11/2016 0222   LDLCALC 84 01/11/2016 0222    Signed, Terrilee Croak, MD [facility] Pager: [phone] (Secure Chat preferred). 07/04/2020

## 2020-07-05 LAB — CBC WITH DIFFERENTIAL/PLATELET
Abs Immature Granulocytes: 0.02 10*3/uL (ref 0.00–0.07)
Basophils Absolute: 0 10*3/uL (ref 0.0–0.1)
Basophils Relative: 0 %
Eosinophils Absolute: 0.2 10*3/uL (ref 0.0–0.5)
Eosinophils Relative: 4 %
HCT: 32.1 % — ABNORMAL LOW (ref 36.0–46.0)
Hemoglobin: 9.4 g/dL — ABNORMAL LOW (ref 12.0–15.0)
Immature Granulocytes: 0 %
Lymphocytes Relative: 24 %
Lymphs Abs: 1.3 10*3/uL (ref 0.7–4.0)
MCH: 26 pg (ref 26.0–34.0)
MCHC: 29.3 g/dL — ABNORMAL LOW (ref 30.0–36.0)
MCV: 88.9 fL (ref 80.0–100.0)
Monocytes Absolute: 0.6 10*3/uL (ref 0.1–1.0)
Monocytes Relative: 11 %
Neutro Abs: 3.4 10*3/uL (ref 1.7–7.7)
Neutrophils Relative %: 61 %
Platelets: 286 10*3/uL (ref 150–400)
RBC: 3.61 MIL/uL — ABNORMAL LOW (ref 3.87–5.11)
RDW: 14.5 % (ref 11.5–15.5)
WBC: 5.6 10*3/uL (ref 4.0–10.5)
nRBC: 0 % (ref 0.0–0.2)

## 2020-07-05 LAB — BASIC METABOLIC PANEL
Anion gap: 9 (ref 5–15)
BUN: 8 mg/dL (ref 8–23)
CO2: 28 mmol/L (ref 22–32)
Calcium: 8.7 mg/dL — ABNORMAL LOW (ref 8.9–10.3)
Chloride: 103 mmol/L (ref 98–111)
Creatinine, Ser: 0.73 mg/dL (ref 0.44–1.00)
GFR calc Af Amer: >60 mL/min (ref >60)
GFR calc non Af Amer: >60 mL/min (ref >60)
Glucose, Bld: 102 mg/dL — ABNORMAL HIGH (ref 70–99)
Potassium: 4.2 mmol/L (ref 3.5–5.1)
Sodium: 140 mmol/L (ref 135–145)

## 2020-07-05 LAB — GLUCOSE, CAPILLARY
Glucose-Capillary: 105 mg/dL — ABNORMAL HIGH (ref 70–99)
Glucose-Capillary: 92 mg/dL (ref 70–99)

## 2020-07-05 MED ORDER — CEFDINIR 300 MG PO CAPS
300.0000 mg | ORAL_CAPSULE | Freq: Two times a day (BID) | ORAL | 0 refills | Status: AC
Start: 2020-07-05 — End: 2020-07-10

## 2020-07-05 NOTE — Progress Notes (Signed)
Physical Therapy Treatment Patient Details Name: Teresa Wiley MRN: [id number] DOB: 09/16/1956 Today's Date: 07/05/2020    History of Present Illness Patient is a 64 y/o female who presents with SOB and generalized weakness, leading to a fall with worsening of her chronic back pain. CXR- RLL PNA. Also with AKI and sepsis. PMH includes HTN, pulmonary HTN, CHF, DM, chronic anticoagulation, PE in 2017, chronic pain syndrome, CVA, anxiety, polypharmacy.    PT Comments    Patient progressing with mobility able to ambulate hallway and negotiate steps while on 3LO2 with SpO2 87-91%.  She desats on RA down to 77% during EOB activity.  Educated needs to wear O2 at all times. Reports she would negotiate steps at home without O2 due to managing cord, but encouraged to have assist to manage cord and to wear O2 full time.  Follow up HHPT indicated to continue energy conservation education and to allow progressive activity tolerance and safety at d/c.  Follow Up Recommendations  Home health PT;Supervision - Intermittent     Equipment Recommendations       Recommendations for Other Services       Precautions / Restrictions Precautions Precautions: Fall Precaution Comments: watch 02, chronic pain Restrictions Weight Bearing Restrictions: No    Mobility  Bed Mobility Overal bed mobility: Modified Independent                Transfers Overall transfer level: Modified independent               General transfer comment: stands unaided  Ambulation/Gait Ambulation/Gait assistance: Supervision Gait Distance (Feet): 250 Feet Assistive device: None Gait Pattern/deviations: Step-through pattern     General Gait Details: some anterior bias with ambulation   Stairs Stairs: Yes Stairs assistance: Supervision Stair Management: One rail Right;Step to pattern;Alternating pattern Number of Stairs: 10 General stair comments: S for safety especially with O2 and sat monitor, patient  with step through pattern, but had to stand to rest at top of the steps SpO2 90% on 3L O2 Lane   Wheelchair Mobility    Modified Rankin (Stroke Patients Only)       Balance Overall balance assessment: Needs assistance   Sitting balance-Leahy Scale: Good       Standing balance-Leahy Scale: Good                              Cognition Arousal/Alertness: Awake/alert Behavior During Therapy: WFL for tasks assessed/performed Overall Cognitive Status: No family/caregiver present to determine baseline cognitive functioning                           Safety/Judgement: Decreased awareness of safety;Decreased awareness of deficits     General Comments: reports just now realizing possibly her vision "going" is related to taking off her oxygen at home to negotiate steps.      Exercises      General Comments General comments (skin integrity, edema, etc.): SpO2 77% on RA donning socks at EOB, educated on needing to use oxygen full time at home and to take rest breaks,  briefly discussed energy conservation and need for HHPT at d/c.      Pertinent Vitals/Pain Pain Assessment: No/denies pain    Home Living                      Prior Function  PT Goals (current goals can now be found in the care plan section) Progress towards PT goals: Progressing toward goals    Frequency    Min 3X/week      PT Plan Current plan remains appropriate    Co-evaluation              AM-PAC PT "6 Clicks" Mobility   Outcome Measure  Help needed turning from your back to your side while in a flat bed without using bedrails?: None Help needed moving from lying on your back to sitting on the side of a flat bed without using bedrails?: None Help needed moving to and from a bed to a chair (including a wheelchair)?: A Little Help needed standing up from a chair using your arms (e.g., wheelchair or bedside chair)?: None Help needed to walk in  hospital room?: A Little Help needed climbing 3-5 steps with a railing? : A Little 6 Click Score: 21    End of Session Equipment Utilized During Treatment: Oxygen Activity Tolerance: Patient tolerated treatment well Patient left: in bed;with call bell/phone within reach   PT Visit Diagnosis: Muscle weakness (generalized) (M62.81);Other abnormalities of gait and mobility (R26.89)     Time: 1110-1136 PT Time Calculation (min) (ACUTE ONLY): 26 min  Charges:  $Gait Training: 8-22 mins $Self Care/Home Management: [place], PT Acute Rehabilitation Services Pager:[phone] Office:[phone] 07/05/2020    Teresa Wiley 07/05/2020, 11:51 AM

## 2020-07-05 NOTE — Progress Notes (Signed)
SPO2 87% while she 's sleeping. We increased O2 flow rate to 4LPM. Able to maintain SPO2 above 94%  Continue to monitor.  Kennyth Lose, RN

## 2020-07-05 NOTE — Progress Notes (Signed)
  Speech Language Pathology Treatment: Dysphagia  Patient Details Name: Teresa Wiley MRN: [id number] DOB: 09-15-1956 Today's Date: 07/05/2020 Time: 0459-9774 SLP Time Calculation (min) (ACUTE ONLY): 11 min  Assessment / Plan / Recommendation Clinical Impression  Pt was seen for dysphagia treatment and was cooperative throughout the session. Pt and nursing reported that the pt has been tolerating the current diet without overt s/sx of aspiration. Pt reported that the "swelling" has resolved and that she believes her swallowing is back to baseline. Pt tolerated regular texture solids and thin liquids via straw using consecutive swallows without symptoms of oropharyngeal dysphagia. It is recommended that the current diet be continued. Further skilled SLP services are not clinically indicated at this time.    HPI HPI: Pt is a 64 y.o. female with medical history significant of DM, GERD, HTN, PAH, 2L O2 at baseline, chronic back pain who presented to the ED with respiratory distress. CXR 8/7: Mild to moderate severity right basilar atelectasis and/or infiltrate. Per referring MD, pt has reported dysphagia with solid food and has anxiety due to "difficulty swallowing" .       SLP Plan  Goals updated;Discharge SLP treatment due to (comment)       Recommendations  Diet recommendations: Regular;Thin liquid Liquids provided via: Cup;Straw Medication Administration: Whole meds with puree (break/cut pills smaller) Supervision: Patient able to self feed Compensations: Slow rate;Small sips/bites                Oral Care Recommendations: Oral care BID Follow up Recommendations: None SLP Visit Diagnosis: Dysphagia, unspecified (R13.10) Plan: Goals updated;Discharge SLP treatment due to (comment)       Teresa Wiley, [place], [place] Office number [phone] Pager [place] 07/05/2020, 10:49 AM

## 2020-07-05 NOTE — Discharge Summary (Signed)
Physician Discharge Summary  Teresa Wiley IOM:[id number] DOB: 04/13/1956 DOA: 07/01/2020  PCP: Secundino Ginger, PA-C  Admit date: 07/01/2020 Discharge date: 07/05/2020  Admitted From: Home Discharge disposition: Home with PT   Code Status: Full Code  Diet Recommendation: Cardiac/diabetic diet  Discharge Diagnosis:   Principal Problem:   Severe sepsis with acute organ dysfunction ([place]) Active Problems:   HTN (hypertension)   Community acquired pneumonia of right lower lobe of lung   Chronic pain syndrome   Diabetes type 2, controlled ([place])   Pulmonary HTN ([place])   AKI (acute kidney injury) ([place])   Acute on chronic respiratory failure with hypoxia ([place])   History of Present Illness / Brief narrative:  Teresa Wiley is a 64 y.o. female with PMH of DM2, HTN, pulmonary hypertension, chronic back pain, degenerative disc disease, anxiety, GERD. Patient presented to the ED on 07/01/2020 with complaint of increasing shortness of breath, generalized weakness, leading to fall and worsening of her chronic back pain.  In the ED, patient was placed on high flow oxygen nasal cannula.  Blood pressure was low in 80s. Labs with WBC count elevated to 25,000, creatinine elevated to 2.7, normal at baseline, lactate 1.9 Chest x-ray showed infiltrates in the right lung base. COVID-19 PCR negative. Patient was started on IV Rocephin, IV doxycycline, IV fluid and admitted under hospitalist service for severe sepsis secondary to pneumonia.  Hospital Course:  Severe sepsis - POA Right lower lobe pneumonia -Presented with shortness of breath, generalized weakness -Chest x-ray with right lower lobe pneumonia. -She was started on IV Rocephin, IV doxycycline.  -We will discharge her home on 5 more days of oral Omnicef. -WBC initially was 25,000, gradually trended down to normal.  No fever in last 72 hours.   -Initially hypotensive, blood pressure improved with IV fluid.  Currently not on IV  fluid.  Acute on chronic hypoxic respiratory failure -Secondary to pneumonia.  COVID-19 negative. -Uses 2 to 3 L oxygen at home -Initially required up to 10 L by high flow nasal cannula. -Currently back to her home requirement of 3 L by nasal cannula.  Dysphagia History of GERD -Reports dysphagia with solid food. -Underwent speech therapy evaluation and modified barium swallow.  No restrictions. -Continue PPI.  Acute kidney injury -Secondary to sepsis. Creatinine was initially elevated to 2.7, normal at baseline -With hydration, creatinine improved back to normal. -Renal USis nonrevealing.  Hypotensive on admission History of hypertension History of pulmonary hypertension -Initially hypotensive in the ED and required IV fluid.  Blood pressure improved.  IV fluid stopped.   -Home meds include Coreg 12.5 mg twice daily, Lasix 40 mg daily, lisinopril 2.5 mg daily -Currently on Coreg and Lasix.  Okay to resume lisinopril post discharge.  DM2 - A1c 5.9 -Home meds include Metformin 5 mg twice daily, Ozempic 1 mg weekly -Resume the same post discharge.  Chronic anemia -Hemoglobin at baseline between 9-10.  Abnormal urinalysis -Urinalysis reports large amount of hemoglobin, moderate amount of leukocytes and rare bacteria.  -No significant growth in culture.  History of pulmonary embolism -Patient reports that she has history of pulmonary embolism few years back.  She has been on Eliquis 5 mg twice daily. I encouraged her to discuss with her primary care provider if she has to be on it for lifelong or not.   Acute on chronic low back pain History of degenerative disc disease -normal renal ultrasound and x-ray of lumbar spine. -Back pain worsened probably because of fall. -Home meds  include baclofen 10 mg 3 times daily, Neurontin 600 mg 3 times daily, Percocet 10/325 every 6 hours as needed -Resumed all home meds.  Mental status and renal function remained  stable.  Fall/generalized weakness PT eval obtained. Home health PT recommended.  Anxiety/insomnia  -Home meds include Xanax 1 mg 3 times daily and Benadryl 25 mg at bedtime for sleep. -Resume both.  Code Status: Code Status: Full Code   Wound care:    Subjective:  Seen and examined this morning.  Pleasant middle-aged African-American female.  Not in distress.  Feels better.  Feels ready to go home.   Discharge Exam:   Vitals:   07/05/20 0425 07/05/20 0430 07/05/20 0600 07/05/20 0900  BP: (!) 143/81   (!) 156/95  Pulse: 84 80 75 86  Resp: 17 18 15 16   Temp: 98.1 F (36.7 C)   97.7 F (36.5 C)  TempSrc: Oral   Oral  SpO2: 91% (!) 87% 94% 97%  Weight:      Height:        Body mass index is 37.32 kg/m.  General exam: Appears calm and comfortable.  Not in physical distress Skin: No rashes, lesions or ulcers. HEENT: Atraumatic, normocephalic, supple neck, no obvious bleeding Lungs: Lungs clear to auscultation bilaterally CVS: Regular rate and rhythm, no murmur GI/Abd: soft, nontender, nondistended, bowel sound present CNS: Alert, awake, oriented x3 Psychiatry: Mood appropriate Extremities: No pedal edema, no calf tenderness  Follow ups:   Discharge Instructions    Diet - low sodium heart healthy   Complete by: As directed    Diet Carb Modified   Complete by: As directed    Increase activity slowly   Complete by: As directed       Follow-up Information    Secundino Ginger, PA-C Follow up.   Specialty: Cardiology Contact information: [facility]  [address] [place] [address] [phone]               Recommendations for Outpatient Follow-Up:   1. Follow-up with PCP as an outpatient  Discharge Instructions:  Follow with Primary MD Secundino Ginger, PA-C in 7 days   Get CBC/BMP checked in next visit within 1 week by PCP or SNF MD ( we routinely change or add medications that can affect your baseline labs and fluid  status, therefore we recommend that you get the mentioned basic workup next visit with your PCP, your PCP may decide not to get them or add new tests based on their clinical decision)  On your next visit with your PCP, please Get Medicines reviewed and adjusted.  Please request your PCP  to go over all Hospital Tests and Procedure/Radiological results at the follow up, please get all Hospital records sent to your Prim MD by signing hospital release before you go home.  Activity: As tolerated with Full fall precautions use walker/cane & assistance as needed  For Heart failure patients - Check your Weight same time everyday, if you gain over 2 pounds, or you develop in leg swelling, experience more shortness of breath or chest pain, call your Primary MD immediately. Follow Cardiac Low Salt Diet and 1.5 lit/day fluid restriction.  If you have smoked or chewed Tobacco in the last 2 yrs please stop smoking, stop any regular Alcohol  and or any Recreational drug use.  If you experience worsening of your admission symptoms, develop shortness of breath, life threatening emergency, suicidal or homicidal thoughts you must seek medical attention  immediately by calling 911 or calling your MD immediately  if symptoms less severe.  You Must read complete instructions/literature along with all the possible adverse reactions/side effects for all the Medicines you take and that have been prescribed to you. Take any new Medicines after you have completely understood and accpet all the possible adverse reactions/side effects.   Do not drive, operate heavy machinery, perform activities at heights, swimming or participation in water activities or provide baby sitting services if your were admitted for syncope or siezures until you have seen by Primary MD or a Neurologist and advised to do so again.  Do not drive when taking Pain medications.  Do not take more than prescribed Pain, Sleep and Anxiety Medications  Wear  Seat belts while driving.   Please note You were cared for by a hospitalist during your hospital stay. If you have any questions about your discharge medications or the care you received while you were in the hospital after you are discharged, you can call the unit and asked to speak with the hospitalist on call if the hospitalist that took care of you is not available. Once you are discharged, your primary care physician will handle any further medical issues. Please note that NO REFILLS for any discharge medications will be authorized once you are discharged, as it is imperative that you return to your primary care physician (or establish a relationship with a primary care physician if you do not have one) for your aftercare needs so that they can reassess your need for medications and monitor your lab values.    Allergies as of 07/05/2020      Reactions   Buprenorphine Hcl Hives   Pt immediately c/o itching and hives after administration of 4mg  Morphine IV, required Benadryl IV for relief.   Erythromycin Anaphylaxis, Nausea And Vomiting   Iodine Hives, Other (See Comments)   Patient reports not having anaphylaxis to iodine. That was entered in mistake previously. REACTION: Hives, throat closes   Morphine And Related Hives   Pt immediately c/o itching and hives after administration of 4mg  Morphine IV, required Benadryl IV for relief.   Shellfish Allergy Anaphylaxis, Hives   Shellfish-derived Products Hives      Medication List    TAKE these medications   acetaminophen 500 MG tablet Commonly known as: TYLENOL Take 1,000 mg by mouth every 6 (six) hours as needed for headache (pain).   albuterol 108 (90 Base) MCG/ACT inhaler Commonly known as: VENTOLIN HFA Inhale 2 puffs into the lungs every 6 (six) hours as needed for wheezing or shortness of breath. For shortness of breath   ALPRAZolam 1 MG tablet Commonly known as: XANAX Take 1 mg by mouth 3 (three) times daily.   baclofen 10 MG  tablet Commonly known as: LIORESAL Take 10 mg by mouth 3 (three) times daily.   carvedilol 12.5 MG tablet Commonly known as: COREG Take 12.5 mg by mouth 2 (two) times daily.   cefdinir 300 MG capsule Commonly known as: OMNICEF Take 1 capsule (300 mg total) by mouth 2 (two) times daily for 5 days.   diphenhydrAMINE 25 MG tablet Commonly known as: BENADRYL Take 50 mg by mouth at bedtime as needed for sleep.   Eliquis 5 MG Tabs tablet Generic drug: apixaban Take 5 mg by mouth 2 (two) times daily.   esomeprazole 20 MG capsule Commonly known as: NEXIUM Take 20 mg by mouth daily.   furosemide 40 MG tablet Commonly known as: LASIX Take 1  tablet (40 mg total) by mouth 2 (two) times daily for 15 days. What changed: when to take this   gabapentin 600 MG tablet Commonly known as: NEURONTIN Take 600 mg by mouth 3 (three) times daily.   lisinopril 2.5 MG tablet Commonly known as: ZESTRIL Take 2.5 mg by mouth daily.   metFORMIN 500 MG tablet Commonly known as: GLUCOPHAGE Take 500 mg by mouth 2 (two) times daily with a meal.   oxyCODONE-acetaminophen 10-325 MG tablet Commonly known as: PERCOCET Take 1 tablet by mouth 4 (four) times daily.   OXYGEN Inhale 2 L into the lungs continuous.   Ozempic (1 MG/DOSE) 4 MG/3ML Sopn Generic drug: Semaglutide (1 MG/DOSE) Inject 1 mg into the skin every Thursday.   Vitamin D (Ergocalciferol) 1.25 MG (50000 UNIT) Caps capsule Commonly known as: DRISDOL Take 50,000 Units by mouth every Wednesday.       Time coordinating discharge: 35 minutes  The results of significant diagnostics from this hospitalization (including imaging, microbiology, ancillary and laboratory) are listed below for reference.    Procedures and Diagnostic Studies:   DG Lumbar Spine 2-3 Views  Result Date: 07/01/2020 CLINICAL DATA:  Fall with lower back pain EXAM: LUMBAR SPINE - 2-3 VIEW COMPARISON:  None. FINDINGS: There is no evidence of lumbar spine fracture.  Alignment is normal. Mild disc height loss is seen at L5-S1 with facet arthrosis. IMPRESSION: No acute fracture or malalignment. Electronically Signed   By: Prudencio Pair M.D.   On: 07/01/2020 23:20   US RENAL  Result Date: 07/01/2020 CLINICAL DATA:  Acute renal insufficiency. EXAM: RENAL / URINARY TRACT ULTRASOUND COMPLETE COMPARISON:  None. FINDINGS: Right Kidney: Renal measurements: 12.31 cm x 5.64 cm x 4.66 cm = volume: 169.40 mL . Echogenicity within normal limits. No mass or hydronephrosis visualized. Left Kidney: Renal measurements: 10.85 cm x 5.90 cm x 5.37 cm = volume: 179.99 mL. Echogenicity within normal limits. No mass or hydronephrosis visualized. Bladder: Appears normal for degree of bladder distention. Bilateral ureteral jets are visualized. Other: None. IMPRESSION: Normal renal ultrasound. Electronically Signed   By: Virgina Norfolk M.D.   On: 07/01/2020 23:49   DG Chest Portable 1 View  Result Date: 07/01/2020 CLINICAL DATA:  Shortness of breath. EXAM: PORTABLE CHEST 1 VIEW COMPARISON:  February 08, 2019 FINDINGS: There are multiple overlying radiopaque cardiac lead wires. Mild to moderate severity atelectasis and/or infiltrate is seen within the right lung base. Small bilateral pleural effusions are seen. No pneumothorax is identified. The heart size and mediastinal contours are within normal limits. The visualized skeletal structures are unremarkable. IMPRESSION: 1. Mild to moderate severity right basilar atelectasis and/or infiltrate. 2. Small bilateral pleural effusions. Electronically Signed   By: Virgina Norfolk M.D.   On: 07/01/2020 18:36     Labs:   Basic Metabolic Panel: Recent Labs  Lab 07/01/20 1948 07/01/20 1948 07/02/20 0228 07/02/20 0228 07/03/20 0954 07/03/20 0954 07/04/20 0123 07/05/20 0436  NA 138  --  140  --  142  --  142 140  K 4.8   < > 4.2   < > 5.5*   < > 4.5 4.2  CL 100  --  104  --  105  --  104 103  CO2 27  --  27  --  25  --  28 28  GLUCOSE 127*   --  92  --  94  --  92 102*  BUN 22  --  18  --  8  --  7* 8  CREATININE 2.74*  --  1.58*  --  0.78  --  0.73 0.73  CALCIUM 8.5*  --  8.2*  --  9.0  --  9.0 8.7*   < > = values in this interval not displayed.   GFR Estimated Creatinine Clearance: 97.3 mL/min (by C-G formula based on SCr of 0.73 mg/dL). Liver Function Tests: Recent Labs  Lab 07/01/20 1948 07/02/20 0228  AST 41 68*  ALT 20 25  ALKPHOS 76 75  BILITOT 0.9 0.7  PROT 6.6 6.4*  ALBUMIN 3.0* 2.8*   No results for input(s): LIPASE, AMYLASE in the last 168 hours. No results for input(s): AMMONIA in the last 168 hours. Coagulation profile Recent Labs  Lab 07/01/20 1948 07/02/20 0228  INR 1.5* 1.6*    CBC: Recent Labs  Lab 07/01/20 1845 07/01/20 1845 07/01/20 1851 07/02/20 0228 07/03/20 0954 07/04/20 0123 07/05/20 0436  WBC 25.2*  --   --  18.9* 8.9 7.1 5.6  NEUTROABS 22.0*  --   --   --  6.5 4.8 3.4  HGB 10.6*   < > 12.6 9.5* 9.7* 9.0* 9.4*  HCT 36.8   < > 37.0 33.7* 34.0* 31.4* 32.1*  MCV 89.5  --   --  91.1 90.2 88.2 88.9  PLT 321  --   --  278 226 281 286   < > = values in this interval not displayed.   Cardiac Enzymes: No results for input(s): CKTOTAL, CKMB, CKMBINDEX, TROPONINI in the last 168 hours. BNP: Invalid input(s): POCBNP CBG: Recent Labs  Lab 07/04/20 0609 07/04/20 1110 07/04/20 1640 07/04/20 2144 07/05/20 0635  GLUCAP 113* 90 128* 105* 92   D-Dimer No results for input(s): DDIMER in the last 72 hours. Hgb A1c No results for input(s): HGBA1C in the last 72 hours. Lipid Profile No results for input(s): CHOL, HDL, LDLCALC, TRIG, CHOLHDL, LDLDIRECT in the last 72 hours. Thyroid function studies No results for input(s): TSH, T4TOTAL, T3FREE, THYROIDAB in the last 72 hours.  Invalid input(s): FREET3 Anemia work up No results for input(s): VITAMINB12, FOLATE, FERRITIN, TIBC, IRON, RETICCTPCT in the last 72 hours. Microbiology Recent Results (from the past 240 hour(s))  SARS  Coronavirus 2 by RT PCR (hospital order, performed in [facility] hospital lab) Nasopharyngeal Nasopharyngeal Swab     Status: None   Collection Time: 07/01/20  6:45 PM   Specimen: Nasopharyngeal Swab  Result Value Ref Range Status   SARS Coronavirus 2 NEGATIVE NEGATIVE Final    Comment: (NOTE) SARS-CoV-2 target nucleic acids are NOT DETECTED.  The SARS-CoV-2 RNA is generally detectable in upper and lower respiratory specimens during the acute phase of infection. The lowest concentration of SARS-CoV-2 viral copies this assay can detect is 250 copies / mL. A negative result does not preclude SARS-CoV-2 infection and should not be used as the sole basis for treatment or other patient management decisions.  A negative result may occur with improper specimen collection / handling, submission of specimen other than nasopharyngeal swab, presence of viral mutation(s) within the areas targeted by this assay, and inadequate number of viral copies (<250 copies / mL). A negative result must be combined with clinical observations, patient history, and epidemiological information.  Fact Sheet for Patients:   StrictlyIdeas.no  Fact Sheet for Healthcare Providers: BankingDealers.co.za  This test is not yet approved or  cleared by the [place] FDA and has been authorized for detection and/or diagnosis of SARS-CoV-2 by FDA under an Emergency Use Authorization (  EUA).  This EUA will remain in effect (meaning this test can be used) for the duration of the COVID-19 declaration under Section 564(b)(1) of the Act, 21 U.S.C. section 360bbb-3(b)(1), unless the authorization is terminated or revoked sooner.  Performed at [facility], [place] [address] [place], [place] [address]   Blood Culture (routine x 2)     Status: None (Preliminary result)   Collection Time: 07/01/20  7:48 PM   Specimen: BLOOD  Result Value Ref Range Status   Specimen  Description BLOOD LEFT ANTECUBITAL  Final   Special Requests   Final    BOTTLES DRAWN AEROBIC AND ANAEROBIC Blood Culture adequate volume   Culture   Final    NO GROWTH 3 DAYS Performed at [facility], [place] [address] [place], [place]  [address]    Report Status PENDING  Incomplete  Blood Culture (routine x 2)     Status: None (Preliminary result)   Collection Time: 07/01/20  7:54 PM   Specimen: BLOOD LEFT FOREARM  Result Value Ref Range Status   Specimen Description BLOOD LEFT FOREARM  Final   Special Requests   Final    BOTTLES DRAWN AEROBIC AND ANAEROBIC Blood Culture results may not be optimal due to an inadequate volume of blood received in culture bottles   Culture   Final    NO GROWTH 3 DAYS Performed at [facility], [place] [address], [place], [place] [address]    Report Status PENDING  Incomplete  Urine culture     Status: Abnormal   Collection Time: 07/01/20  9:45 PM   Specimen: Urine, Random  Result Value Ref Range Status   Specimen Description URINE, RANDOM  Final   Special Requests   Final    NONE Performed at [facility], [place] [address] [place],  [address]    Culture MULTIPLE SPECIES PRESENT, SUGGEST RECOLLECTION (A)  Final   Report Status 07/03/2020 FINAL  Final     Signed: Marlowe Aschoff Kaleyah Labreck  [facility] 07/05/2020, 9:35 AM

## 2020-07-05 NOTE — Progress Notes (Signed)
Plan of care reviewed. Pt's appeared very drowsy at early of night shift. She opened her eyes with arousing, then quickly went back to sleep. Xanax was held at bedtime.   She's hemodynamically stable,  Remained afebrile. NSR on monitor, HR 80s, BP 120/80 - 134/86 mmHg, SPO2 89-94% on 3 LPM of HFNCL.  Continue to monitor.  Kennyth Lose, RN

## 2020-07-05 NOTE — TOC Transition Note (Signed)
Transition of Care (TOC) - CM/SW Discharge Note Marvetta Gibbons RN, BSN Transitions of Care Unit 4E- RN Case Manager See Treatment Team for direct phone #    Patient Details  Name: Teresa Wiley MRN: [id number] Date of Birth: [birth year]  Transition of Care [facility]) CM/SW Contact:  Dawayne Patricia, RN Phone Number: 07/05/2020, 3:01 PM   Clinical Narrative:    Pt stable for transition home, order written for HHPT, pt with no preference for [facility] agency- has managed Medicaid. Calls made to [facility]- unable to accept, [facility]- unable to accept, [facility]- able to accept- per [facility] they are able to do a start of care for 8/12- home therapist will be Amparo Bristol- who will contact pt to set up [facility] visits. Call made to pt post discharge to notify her of [facility] arrangements- pt appreciative of [place] being able to be secured.    Final next level of care: [place] Barriers to Discharge: No Barriers Identified   Patient Goals and CMS Choice Patient states their goals for this hospitalization and ongoing recovery are:: get better CMS Medicare.gov Compare Post Acute Care list provided to:: Patient Choice offered to / list presented to : Patient  Discharge Placement                  Home with  [facility]      Discharge Plan and Services   Discharge Planning Services: CM Consult Post Acute Care Choice: Home Health          DME Arranged: N/A DME Agency: NA       HH Arranged: PT HH Agency: [facility] Date HH Agency Contacted: 07/05/20 Time [place]: 3810 Representative spoke with at [place]: [place] ([place]) Interventions     Readmission Risk Interventions Readmission Risk Prevention Plan 02/11/2019 02/11/2019 02/10/2019  Transportation Screening - - Complete  PCP or Specialist Appt within 3-5 Days - Complete -  HRI or [place] - Complete Patient refused  Social Work Consult for [place] Planning/Counseling (No Data) Not  Complete -  Palliative Care Screening - - Not Applicable  Medication Review Press photographer) - - Complete  Some recent data might be hidden

## 2020-07-06 LAB — CULTURE, BLOOD (ROUTINE X 2)
Culture: NO GROWTH
Culture: NO GROWTH
Special Requests: ADEQUATE

## 2020-07-18 ENCOUNTER — Emergency Department (HOSPITAL_COMMUNITY): Payer: Medicaid Other

## 2020-07-18 ENCOUNTER — Inpatient Hospital Stay (HOSPITAL_COMMUNITY)
Admission: EM | Admit: 2020-07-18 | Discharge: 2020-07-20 | DRG: 312 | Disposition: A | Discharge status: Home / Self Care | Payer: Medicaid Other | Attending: Internal Medicine | Admitting: Internal Medicine

## 2020-07-18 ENCOUNTER — Encounter (HOSPITAL_COMMUNITY): Payer: Self-pay | Admitting: Emergency Medicine

## 2020-07-18 ENCOUNTER — Other Ambulatory Visit (HOSPITAL_COMMUNITY): Payer: Medicaid Other

## 2020-07-18 ENCOUNTER — Other Ambulatory Visit: Payer: Self-pay

## 2020-07-18 DIAGNOSIS — Z7984 Long term (current) use of oral hypoglycemic drugs: Secondary | ICD-10-CM

## 2020-07-18 DIAGNOSIS — K5900 Constipation, unspecified: Secondary | ICD-10-CM | POA: Diagnosis present

## 2020-07-18 DIAGNOSIS — Z86711 Personal history of pulmonary embolism: Secondary | ICD-10-CM

## 2020-07-18 DIAGNOSIS — N390 Urinary tract infection, site not specified: Secondary | ICD-10-CM | POA: Diagnosis present

## 2020-07-18 DIAGNOSIS — Z79891 Long term (current) use of opiate analgesic: Secondary | ICD-10-CM

## 2020-07-18 DIAGNOSIS — T464X5A Adverse effect of angiotensin-converting-enzyme inhibitors, initial encounter: Secondary | ICD-10-CM | POA: Diagnosis present

## 2020-07-18 DIAGNOSIS — T402X5A Adverse effect of other opioids, initial encounter: Secondary | ICD-10-CM | POA: Diagnosis present

## 2020-07-18 DIAGNOSIS — E66812 Obesity, class 2: Secondary | ICD-10-CM

## 2020-07-18 DIAGNOSIS — I952 Hypotension due to drugs: Principal | ICD-10-CM | POA: Diagnosis present

## 2020-07-18 DIAGNOSIS — R079 Chest pain, unspecified: Secondary | ICD-10-CM | POA: Diagnosis present

## 2020-07-18 DIAGNOSIS — Z87891 Personal history of nicotine dependence: Secondary | ICD-10-CM

## 2020-07-18 DIAGNOSIS — J9611 Chronic respiratory failure with hypoxia: Secondary | ICD-10-CM | POA: Diagnosis present

## 2020-07-18 DIAGNOSIS — H547 Unspecified visual loss: Secondary | ICD-10-CM | POA: Diagnosis present

## 2020-07-18 DIAGNOSIS — R55 Syncope and collapse: Secondary | ICD-10-CM | POA: Diagnosis not present

## 2020-07-18 DIAGNOSIS — R071 Chest pain on breathing: Secondary | ICD-10-CM

## 2020-07-18 DIAGNOSIS — N179 Acute kidney failure, unspecified: Secondary | ICD-10-CM | POA: Diagnosis present

## 2020-07-18 DIAGNOSIS — G92 Toxic encephalopathy: Secondary | ICD-10-CM | POA: Diagnosis present

## 2020-07-18 DIAGNOSIS — M549 Dorsalgia, unspecified: Secondary | ICD-10-CM | POA: Diagnosis present

## 2020-07-18 DIAGNOSIS — K219 Gastro-esophageal reflux disease without esophagitis: Secondary | ICD-10-CM | POA: Diagnosis present

## 2020-07-18 DIAGNOSIS — Z9289 Personal history of other medical treatment: Secondary | ICD-10-CM

## 2020-07-18 DIAGNOSIS — G9349 Other encephalopathy: Secondary | ICD-10-CM | POA: Diagnosis present

## 2020-07-18 DIAGNOSIS — G9341 Metabolic encephalopathy: Secondary | ICD-10-CM | POA: Diagnosis present

## 2020-07-18 DIAGNOSIS — E785 Hyperlipidemia, unspecified: Secondary | ICD-10-CM | POA: Diagnosis present

## 2020-07-18 DIAGNOSIS — I10 Essential (primary) hypertension: Secondary | ICD-10-CM | POA: Diagnosis present

## 2020-07-18 DIAGNOSIS — Z8249 Family history of ischemic heart disease and other diseases of the circulatory system: Secondary | ICD-10-CM

## 2020-07-18 DIAGNOSIS — E860 Dehydration: Secondary | ICD-10-CM | POA: Diagnosis present

## 2020-07-18 DIAGNOSIS — Z885 Allergy status to narcotic agent status: Secondary | ICD-10-CM

## 2020-07-18 DIAGNOSIS — N3000 Acute cystitis without hematuria: Secondary | ICD-10-CM

## 2020-07-18 DIAGNOSIS — Z881 Allergy status to other antibiotic agents status: Secondary | ICD-10-CM

## 2020-07-18 DIAGNOSIS — J9621 Acute and chronic respiratory failure with hypoxia: Secondary | ICD-10-CM

## 2020-07-18 DIAGNOSIS — Z9981 Dependence on supplemental oxygen: Secondary | ICD-10-CM

## 2020-07-18 DIAGNOSIS — Z9049 Acquired absence of other specified parts of digestive tract: Secondary | ICD-10-CM

## 2020-07-18 DIAGNOSIS — I2721 Secondary pulmonary arterial hypertension: Secondary | ICD-10-CM | POA: Diagnosis present

## 2020-07-18 DIAGNOSIS — E669 Obesity, unspecified: Secondary | ICD-10-CM | POA: Diagnosis present

## 2020-07-18 DIAGNOSIS — Z20822 Contact with and (suspected) exposure to covid-19: Secondary | ICD-10-CM | POA: Diagnosis present

## 2020-07-18 DIAGNOSIS — Z79899 Other long term (current) drug therapy: Secondary | ICD-10-CM

## 2020-07-18 DIAGNOSIS — Z888 Allergy status to other drugs, medicaments and biological substances status: Secondary | ICD-10-CM

## 2020-07-18 DIAGNOSIS — J449 Chronic obstructive pulmonary disease, unspecified: Secondary | ICD-10-CM | POA: Diagnosis present

## 2020-07-18 DIAGNOSIS — Z8701 Personal history of pneumonia (recurrent): Secondary | ICD-10-CM

## 2020-07-18 DIAGNOSIS — Z823 Family history of stroke: Secondary | ICD-10-CM

## 2020-07-18 DIAGNOSIS — E119 Type 2 diabetes mellitus without complications: Secondary | ICD-10-CM

## 2020-07-18 DIAGNOSIS — I959 Hypotension, unspecified: Secondary | ICD-10-CM | POA: Diagnosis present

## 2020-07-18 DIAGNOSIS — G894 Chronic pain syndrome: Secondary | ICD-10-CM | POA: Diagnosis present

## 2020-07-18 DIAGNOSIS — Z7901 Long term (current) use of anticoagulants: Secondary | ICD-10-CM

## 2020-07-18 DIAGNOSIS — Z6835 Body mass index (BMI) 35.0-35.9, adult: Secondary | ICD-10-CM

## 2020-07-18 DIAGNOSIS — K449 Diaphragmatic hernia without obstruction or gangrene: Secondary | ICD-10-CM | POA: Diagnosis present

## 2020-07-18 LAB — URINALYSIS, ROUTINE W REFLEX MICROSCOPIC
Bilirubin Urine: NEGATIVE
Glucose, UA: NEGATIVE mg/dL
Ketones, ur: NEGATIVE mg/dL
Nitrite: NEGATIVE
Protein, ur: NEGATIVE mg/dL
Specific Gravity, Urine: 1.011 (ref 1.005–1.030)
pH: 5 (ref 5.0–8.0)

## 2020-07-18 LAB — RAPID URINE DRUG SCREEN, HOSP PERFORMED
Amphetamines: NOT DETECTED
Barbiturates: NOT DETECTED
Benzodiazepines: POSITIVE — AB
Cocaine: NOT DETECTED
Opiates: POSITIVE — AB
Tetrahydrocannabinol: NOT DETECTED

## 2020-07-18 LAB — I-STAT CHEM 8, ED
BUN: 13 mg/dL (ref 8–23)
Calcium, Ion: 1.17 mmol/L (ref 1.15–1.40)
Chloride: 99 mmol/L (ref 98–111)
Creatinine, Ser: 1 mg/dL (ref 0.44–1.00)
Glucose, Bld: 109 mg/dL — ABNORMAL HIGH (ref 70–99)
HCT: 39 % (ref 36.0–46.0)
Hemoglobin: 13.3 g/dL (ref 12.0–15.0)
Potassium: 4.5 mmol/L (ref 3.5–5.1)
Sodium: 139 mmol/L (ref 135–145)
TCO2: 29 mmol/L (ref 22–32)

## 2020-07-18 LAB — CBC WITH DIFFERENTIAL/PLATELET
Abs Immature Granulocytes: 0.06 10*3/uL (ref 0.00–0.07)
Basophils Absolute: 0 10*3/uL (ref 0.0–0.1)
Basophils Relative: 0 %
Eosinophils Absolute: 0.2 10*3/uL (ref 0.0–0.5)
Eosinophils Relative: 3 %
HCT: 38.8 % (ref 36.0–46.0)
Hemoglobin: 11.2 g/dL — ABNORMAL LOW (ref 12.0–15.0)
Immature Granulocytes: 1 %
Lymphocytes Relative: 26 %
Lymphs Abs: 1.9 10*3/uL (ref 0.7–4.0)
MCH: 26.2 pg (ref 26.0–34.0)
MCHC: 28.9 g/dL — ABNORMAL LOW (ref 30.0–36.0)
MCV: 90.9 fL (ref 80.0–100.0)
Monocytes Absolute: 0.9 10*3/uL (ref 0.1–1.0)
Monocytes Relative: 12 %
Neutro Abs: 4.2 10*3/uL (ref 1.7–7.7)
Neutrophils Relative %: 58 %
Platelets: 203 10*3/uL (ref 150–400)
RBC: 4.27 MIL/uL (ref 3.87–5.11)
RDW: 14.7 % (ref 11.5–15.5)
WBC: 7.3 10*3/uL (ref 4.0–10.5)
nRBC: 0 % (ref 0.0–0.2)

## 2020-07-18 LAB — CBG MONITORING, ED
Glucose-Capillary: 84 mg/dL (ref 70–99)
Glucose-Capillary: 66 mg/dL — ABNORMAL LOW (ref 70–99)
Glucose-Capillary: 75 mg/dL (ref 70–99)
Glucose-Capillary: 119 mg/dL — ABNORMAL HIGH (ref 70–99)

## 2020-07-18 LAB — COMPREHENSIVE METABOLIC PANEL
ALT: 16 U/L (ref 0–44)
AST: 29 U/L (ref 15–41)
Albumin: 3.2 g/dL — ABNORMAL LOW (ref 3.5–5.0)
Alkaline Phosphatase: 74 U/L (ref 38–126)
Anion gap: 15 (ref 5–15)
BUN: 11 mg/dL (ref 8–23)
CO2: 25 mmol/L (ref 22–32)
Calcium: 9.3 mg/dL (ref 8.9–10.3)
Chloride: 101 mmol/L (ref 98–111)
Creatinine, Ser: 1.05 mg/dL — ABNORMAL HIGH (ref 0.44–1.00)
GFR calc Af Amer: >60 mL/min (ref >60)
GFR calc non Af Amer: 56 mL/min — ABNORMAL LOW (ref >60)
Glucose, Bld: 107 mg/dL — ABNORMAL HIGH (ref 70–99)
Potassium: 4.4 mmol/L (ref 3.5–5.1)
Sodium: 141 mmol/L (ref 135–145)
Total Bilirubin: 0.5 mg/dL (ref 0.3–1.2)
Total Protein: 7.3 g/dL (ref 6.5–8.1)

## 2020-07-18 LAB — SARS CORONAVIRUS 2 BY RT PCR (HOSPITAL ORDER, PERFORMED IN ~~LOC~~ HOSPITAL LAB): SARS Coronavirus 2: NEGATIVE

## 2020-07-18 LAB — TROPONIN I (HIGH SENSITIVITY)
Troponin I (High Sensitivity): 2 ng/L (ref <18)
Troponin I (High Sensitivity): 3 ng/L (ref <18)

## 2020-07-18 LAB — TSH: TSH: 0.602 u[IU]/mL (ref 0.350–4.500)

## 2020-07-18 MED ORDER — ONDANSETRON HCL 4 MG/2ML IJ SOLN: 4.0000 mg | Freq: Four times a day (QID) | INTRAMUSCULAR | Status: DC | PRN

## 2020-07-18 MED ORDER — ALPRAZOLAM 0.5 MG PO TABS
1.0000 mg | ORAL_TABLET | Freq: Three times a day (TID) | ORAL | Status: DC
  Administered 2020-07-18 – 2020-07-20 (×7): 1 mg via ORAL
  Filled 2020-07-18: qty 4
  Filled 2020-07-18 (×5): qty 2
  Filled 2020-07-18: qty 4

## 2020-07-18 MED ORDER — ALBUTEROL SULFATE (2.5 MG/3ML) 0.083% IN NEBU: 3.0000 mL | INHALATION_SOLUTION | Freq: Four times a day (QID) | RESPIRATORY_TRACT | Status: DC | PRN

## 2020-07-18 MED ORDER — PANTOPRAZOLE SODIUM 40 MG PO TBEC
40.0000 mg | DELAYED_RELEASE_TABLET | Freq: Every day | ORAL | Status: DC
  Administered 2020-07-18 – 2020-07-20 (×3): 40 mg via ORAL
  Filled 2020-07-18 (×3): qty 1

## 2020-07-18 MED ORDER — OXYCODONE HCL 5 MG PO TABS
5.0000 mg | ORAL_TABLET | Freq: Four times a day (QID) | ORAL | Status: DC
  Administered 2020-07-18 – 2020-07-19 (×5): 5 mg via ORAL
  Filled 2020-07-18 (×5): qty 1

## 2020-07-18 MED ORDER — OXYCODONE-ACETAMINOPHEN 10-325 MG PO TABS: 1.0000 | ORAL_TABLET | Freq: Four times a day (QID) | ORAL | Status: DC

## 2020-07-18 MED ORDER — SODIUM CHLORIDE 0.9 % IV BOLUS
500.0000 mL | Freq: Once | INTRAVENOUS | Status: AC
  Administered 2020-07-18: 500 mL via INTRAVENOUS

## 2020-07-18 MED ORDER — ACETAMINOPHEN 325 MG PO TABS
650.0000 mg | ORAL_TABLET | Freq: Four times a day (QID) | ORAL | Status: DC | PRN
  Administered 2020-07-18 – 2020-07-19 (×3): 650 mg via ORAL
  Filled 2020-07-18 (×4): qty 2

## 2020-07-18 MED ORDER — INSULIN ASPART 100 UNIT/ML ~~LOC~~ SOLN
0.0000 [IU] | Freq: Three times a day (TID) | SUBCUTANEOUS | Status: DC
  Administered 2020-07-20: 3 [IU] via SUBCUTANEOUS

## 2020-07-18 MED ORDER — GABAPENTIN 600 MG PO TABS
600.0000 mg | ORAL_TABLET | Freq: Three times a day (TID) | ORAL | Status: DC
  Administered 2020-07-18 – 2020-07-20 (×7): 600 mg via ORAL
  Filled 2020-07-18 (×10): qty 1

## 2020-07-18 MED ORDER — INSULIN ASPART 100 UNIT/ML ~~LOC~~ SOLN: 0.0000 [IU] | Freq: Every day | SUBCUTANEOUS | Status: DC

## 2020-07-18 MED ORDER — ACETAMINOPHEN 650 MG RE SUPP: 650.0000 mg | Freq: Four times a day (QID) | RECTAL | Status: DC | PRN

## 2020-07-18 MED ORDER — BISACODYL 5 MG PO TBEC: 5.0000 mg | DELAYED_RELEASE_TABLET | Freq: Every day | ORAL | Status: DC | PRN

## 2020-07-18 MED ORDER — POLYETHYLENE GLYCOL 3350 17 G PO PACK: 17.0000 g | PACK | Freq: Every day | ORAL | Status: DC | PRN

## 2020-07-18 MED ORDER — APIXABAN 5 MG PO TABS
5.0000 mg | ORAL_TABLET | Freq: Two times a day (BID) | ORAL | Status: DC
  Administered 2020-07-18 – 2020-07-20 (×5): 5 mg via ORAL
  Filled 2020-07-18 (×5): qty 1

## 2020-07-18 MED ORDER — ONDANSETRON HCL 4 MG PO TABS: 4.0000 mg | ORAL_TABLET | Freq: Four times a day (QID) | ORAL | Status: DC | PRN

## 2020-07-18 MED ORDER — DOCUSATE SODIUM 100 MG PO CAPS
100.0000 mg | ORAL_CAPSULE | Freq: Two times a day (BID) | ORAL | Status: DC
  Administered 2020-07-18 – 2020-07-20 (×5): 100 mg via ORAL
  Filled 2020-07-18 (×5): qty 1

## 2020-07-18 MED ORDER — GLUCAGON HCL RDNA (DIAGNOSTIC) 1 MG IJ SOLR
2.0000 mg | Freq: Once | INTRAMUSCULAR | Status: AC
  Administered 2020-07-18: 2 mg via INTRAVENOUS
  Filled 2020-07-18: qty 2

## 2020-07-18 MED ORDER — OXYCODONE-ACETAMINOPHEN 5-325 MG PO TABS
1.0000 | ORAL_TABLET | Freq: Four times a day (QID) | ORAL | Status: DC
  Administered 2020-07-18 – 2020-07-19 (×5): 1 via ORAL
  Filled 2020-07-18 (×5): qty 1

## 2020-07-18 MED ORDER — BACLOFEN 10 MG PO TABS
10.0000 mg | ORAL_TABLET | Freq: Three times a day (TID) | ORAL | Status: DC
  Administered 2020-07-18 – 2020-07-20 (×7): 10 mg via ORAL
  Filled 2020-07-18 (×9): qty 1

## 2020-07-18 MED ORDER — SODIUM CHLORIDE 0.9% FLUSH
3.0000 mL | Freq: Two times a day (BID) | INTRAVENOUS | Status: DC
  Administered 2020-07-18 – 2020-07-20 (×2): 3 mL via INTRAVENOUS

## 2020-07-18 MED ORDER — SODIUM CHLORIDE 0.9 % IV SOLN
1.0000 g | Freq: Once | INTRAVENOUS | Status: AC
  Administered 2020-07-18: 1 g via INTRAVENOUS
  Filled 2020-07-18: qty 10

## 2020-07-18 MED ORDER — SODIUM CHLORIDE 0.9 % IV SOLN: INTRAVENOUS | Status: DC

## 2020-07-18 NOTE — ED Notes (Signed)
RN unable to draw labs, requested lab to draw.

## 2020-07-18 NOTE — ED Notes (Signed)
Pt given OJ for CBG 66
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
## 2020-07-18 NOTE — ED Triage Notes (Signed)
Pt arrives via EMS from home where she went to the restroom 1 hour ago and family found pt unconscious on the toilet. Pt was hypotensive with EMS and received 500 mL NS. Pt slow to respond but able to answer orientation questions on arrival.

## 2020-07-18 NOTE — ED Notes (Signed)
Pt unable to stand to complete orthostartic vitals. Stated she was very dizzy and in a lot of pain.

## 2020-07-18 NOTE — ED Notes (Signed)
RN called lab, Shanon Brow in lab will add on RDS urine.
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
## 2020-07-19 ENCOUNTER — Inpatient Hospital Stay (HOSPITAL_COMMUNITY): Payer: Medicaid Other

## 2020-07-19 DIAGNOSIS — M549 Dorsalgia, unspecified: Secondary | ICD-10-CM | POA: Diagnosis present

## 2020-07-19 DIAGNOSIS — I2721 Secondary pulmonary arterial hypertension: Secondary | ICD-10-CM | POA: Diagnosis not present

## 2020-07-19 DIAGNOSIS — G894 Chronic pain syndrome: Secondary | ICD-10-CM | POA: Diagnosis present

## 2020-07-19 DIAGNOSIS — J9611 Chronic respiratory failure with hypoxia: Secondary | ICD-10-CM | POA: Diagnosis not present

## 2020-07-19 DIAGNOSIS — E860 Dehydration: Secondary | ICD-10-CM | POA: Diagnosis present

## 2020-07-19 DIAGNOSIS — I952 Hypotension due to drugs: Secondary | ICD-10-CM | POA: Diagnosis not present

## 2020-07-19 DIAGNOSIS — N179 Acute kidney failure, unspecified: Secondary | ICD-10-CM | POA: Diagnosis not present

## 2020-07-19 DIAGNOSIS — K219 Gastro-esophageal reflux disease without esophagitis: Secondary | ICD-10-CM | POA: Diagnosis present

## 2020-07-19 DIAGNOSIS — I959 Hypotension, unspecified: Secondary | ICD-10-CM | POA: Diagnosis not present

## 2020-07-19 DIAGNOSIS — N390 Urinary tract infection, site not specified: Secondary | ICD-10-CM | POA: Diagnosis not present

## 2020-07-19 DIAGNOSIS — H547 Unspecified visual loss: Secondary | ICD-10-CM | POA: Diagnosis present

## 2020-07-19 DIAGNOSIS — E119 Type 2 diabetes mellitus without complications: Secondary | ICD-10-CM | POA: Diagnosis not present

## 2020-07-19 DIAGNOSIS — G9349 Other encephalopathy: Secondary | ICD-10-CM | POA: Diagnosis not present

## 2020-07-19 DIAGNOSIS — G92 Toxic encephalopathy: Secondary | ICD-10-CM | POA: Diagnosis not present

## 2020-07-19 DIAGNOSIS — R55 Syncope and collapse: Secondary | ICD-10-CM | POA: Diagnosis present

## 2020-07-19 DIAGNOSIS — E785 Hyperlipidemia, unspecified: Secondary | ICD-10-CM | POA: Diagnosis present

## 2020-07-19 DIAGNOSIS — Z20822 Contact with and (suspected) exposure to covid-19: Secondary | ICD-10-CM | POA: Diagnosis not present

## 2020-07-19 DIAGNOSIS — E669 Obesity, unspecified: Secondary | ICD-10-CM | POA: Diagnosis present

## 2020-07-19 DIAGNOSIS — K5900 Constipation, unspecified: Secondary | ICD-10-CM | POA: Diagnosis present

## 2020-07-19 DIAGNOSIS — J449 Chronic obstructive pulmonary disease, unspecified: Secondary | ICD-10-CM | POA: Diagnosis present

## 2020-07-19 DIAGNOSIS — Z6835 Body mass index (BMI) 35.0-35.9, adult: Secondary | ICD-10-CM | POA: Diagnosis not present

## 2020-07-19 DIAGNOSIS — K449 Diaphragmatic hernia without obstruction or gangrene: Secondary | ICD-10-CM | POA: Diagnosis present

## 2020-07-19 DIAGNOSIS — Z8701 Personal history of pneumonia (recurrent): Secondary | ICD-10-CM | POA: Diagnosis not present

## 2020-07-19 DIAGNOSIS — T402X5A Adverse effect of other opioids, initial encounter: Secondary | ICD-10-CM | POA: Diagnosis present

## 2020-07-19 DIAGNOSIS — R079 Chest pain, unspecified: Secondary | ICD-10-CM | POA: Diagnosis present

## 2020-07-19 DIAGNOSIS — I10 Essential (primary) hypertension: Secondary | ICD-10-CM | POA: Diagnosis not present

## 2020-07-19 LAB — CBC
HCT: 32.7 % — ABNORMAL LOW (ref 36.0–46.0)
Hemoglobin: 9.3 g/dL — ABNORMAL LOW (ref 12.0–15.0)
MCH: 25.1 pg — ABNORMAL LOW (ref 26.0–34.0)
MCHC: 28.4 g/dL — ABNORMAL LOW (ref 30.0–36.0)
MCV: 88.1 fL (ref 80.0–100.0)
Platelets: 363 10*3/uL (ref 150–400)
RBC: 3.71 MIL/uL — ABNORMAL LOW (ref 3.87–5.11)
RDW: 14.6 % (ref 11.5–15.5)
WBC: 4.8 10*3/uL (ref 4.0–10.5)
nRBC: 0 % (ref 0.0–0.2)

## 2020-07-19 LAB — GLUCOSE, CAPILLARY
Glucose-Capillary: 106 mg/dL — ABNORMAL HIGH (ref 70–99)
Glucose-Capillary: 99 mg/dL (ref 70–99)
Glucose-Capillary: 101 mg/dL — ABNORMAL HIGH (ref 70–99)
Glucose-Capillary: 136 mg/dL — ABNORMAL HIGH (ref 70–99)

## 2020-07-19 LAB — BASIC METABOLIC PANEL
Anion gap: 7 (ref 5–15)
BUN: 6 mg/dL — ABNORMAL LOW (ref 8–23)
CO2: 27 mmol/L (ref 22–32)
Calcium: 8.6 mg/dL — ABNORMAL LOW (ref 8.9–10.3)
Chloride: 105 mmol/L (ref 98–111)
Creatinine, Ser: 0.74 mg/dL (ref 0.44–1.00)
GFR calc Af Amer: >60 mL/min (ref >60)
GFR calc non Af Amer: >60 mL/min (ref >60)
Glucose, Bld: 99 mg/dL (ref 70–99)
Potassium: 3.9 mmol/L (ref 3.5–5.1)
Sodium: 139 mmol/L (ref 135–145)

## 2020-07-19 MED ORDER — TECHNETIUM TO 99M ALBUMIN AGGREGATED
3.8000 | Freq: Once | INTRAVENOUS | Status: AC | PRN
  Administered 2020-07-19: 3.8 via INTRAVENOUS

## 2020-07-19 MED ORDER — VITAMIN D (ERGOCALCIFEROL) 1.25 MG (50000 UNIT) PO CAPS
50000.0000 [IU] | ORAL_CAPSULE | ORAL | Status: DC
  Administered 2020-07-19: 50000 [IU] via ORAL
  Filled 2020-07-19: qty 1

## 2020-07-19 NOTE — Care Management (Signed)
Patient currently off unit. Through chart review patient from home with wife, has home oxygen with [employer], a walker and shower seat.   Patient recently discharged with HHPT with [facility]. NCM called [facility] and confirmed PTA admission she was active for HHPT and will need resumption of care orders.   Will follow up with patient.   Magdalen Spatz RN

## 2020-07-19 NOTE — Plan of Care (Signed)
  Problem: Education: Goal: Knowledge of General Education information will improve Description: Including pain rating scale, medication(s)/side effects and non-pharmacologic comfort measures 07/19/2020 0136 by Guinevere Scarlet, RN Outcome: Progressing 07/19/2020 0133 by Guinevere Scarlet, RN Outcome: Progressing   Problem: Health Behavior/Discharge Planning: Goal: Ability to manage health-related needs will improve 07/19/2020 0136 by Guinevere Scarlet, RN Outcome: Progressing 07/19/2020 0133 by Guinevere Scarlet, RN Outcome: Progressing   Problem: Clinical Measurements: Goal: Ability to maintain clinical measurements within normal limits will improve 07/19/2020 0136 by Guinevere Scarlet, RN Outcome: Progressing 07/19/2020 0133 by Guinevere Scarlet, RN Outcome: Progressing

## 2020-07-19 NOTE — Evaluation (Addendum)
I agree with the following treatment note after review of the documentation. This session was performed under the supervision of a licensed clinician.   Lou Miner, DPT  Acute Rehabilitation Services  Pager: [phone]    Physical Therapy Evaluation Patient Details Name: Teresa Wiley MRN: [id number] DOB: [birth year] Today's Date: 07/19/2020   History of Present Illness  Patient is a 64 y/o female who presents with AMS and loss of consciousness after a bowel movement; likely vasovagal response. Also with AKI and sepsis. PMH includes HTN, pulmonary HTN, CHF, DM, chronic anticoagulation, PE in 2017, chronic pain syndrome, CVA, anxiety, polypharmacy.    Clinical Impression  Pt was evaluated and assessed for the above diagnosis and impairments below. Pt was very lethargic initially, however, became more alert as session progressed. Pt was impulsive and required multiple safety cues throughout. Pt tended to ignore all cues. Pt required supervision to min assist with bed mobility, and min guard for transfers. Pt lives at home with her family and has 24/7 assistance. Recommend HHPT to address mobility deficits.. Pt would continue to benefit from acute therapy in order to increase her independence with functional tasks. Will continue to follow acutely.     Follow Up Recommendations Home health PT;Supervision/Assistance - 24 hour    Equipment Recommendations  None recommended by PT    Recommendations for Other Services       Precautions / Restrictions Precautions Precautions: Fall Restrictions Weight Bearing Restrictions: No      Mobility  Bed Mobility Overal bed mobility: Needs Assistance Bed Mobility: Supine to Sit;Sit to Supine     Supine to sit: Min assist;HOB elevated Sit to supine: Supervision;HOB elevated   General bed mobility comments: pt required min assist to assist with getting legs off the EOB. Pt was able to get back to bed on her own  Transfers Overall transfer  level: Needs assistance Equipment used: None Transfers: Stand Pivot Transfers;Sit to/from Stand Sit to Stand: Min guard Stand pivot transfers: Min guard       General transfer comment: pt required min gaurd for safety secondary to her impulsiveness. Required safety cues to wait for PT prior to performing transfers. Getting tangled in O2 line during transfer.   Ambulation/Gait                Stairs            Wheelchair Mobility    Modified Rankin (Stroke Patients Only)       Balance Overall balance assessment: Needs assistance Sitting-balance support: Feet supported;Bilateral upper extremity supported;No upper extremity supported Sitting balance-Leahy Scale: Fair     Standing balance support: During functional activity;No upper extremity supported Standing balance-Leahy Scale: Fair                               Pertinent Vitals/Pain Pain Assessment: Faces Faces Pain Scale: Hurts little more Pain Location: headache Pain Descriptors / Indicators: Headache Pain Intervention(s): Limited activity within patient's tolerance;Monitored during session    [place] expects to be discharged to:: Private residence Living Arrangements: Spouse/significant other;Children Available Help at Discharge: Family;Available 24 hours/day Type of Home: House Home Access: Stairs to enter Entrance Stairs-Rails: None Entrance Stairs-Number of Steps: 2 Home Layout: Two level Home Equipment: Walker - 2 wheels;Shower seat      Prior Function Level of Independence: Needs assistance   Gait / Transfers Assistance Needed: independent with ambulation. wears 2.5L of O2 at home  ADL's / Homemaking Assistance Needed: Does own ADLs. Does not cook/clean due to SOB.        Hand Dominance   Dominant Hand: Right    Extremity/Trunk Assessment   Upper Extremity Assessment Upper Extremity Assessment: Defer to OT evaluation    Lower Extremity  Assessment Lower Extremity Assessment: Generalized weakness    Cervical / Trunk Assessment Cervical / Trunk Assessment: Normal  Communication   Communication: No difficulties  Cognition Arousal/Alertness: Lethargic;Awake/alert Behavior During Therapy: Impulsive Overall Cognitive Status: No family/caregiver present to determine baseline cognitive functioning Area of Impairment: Safety/judgement;Awareness;Orientation;Attention                 Orientation Level: Disoriented to;Time;Place Current Attention Level: Sustained     Safety/Judgement: Decreased awareness of safety;Decreased awareness of deficits Awareness: Emergent   General Comments: Pt was oriented to who she was and why she was in the hospital. pt was very impulsive during session. Pt standing up impulsively despite cues to wait for PT to disconnect O2 from wall to place on tank. Getting tangled up in wires. Poor safety awareness and awareness of deficits. Initially very lethargic, however, was more alert by end of session       General Comments      Exercises     Assessment/Plan    PT Assessment Patient needs continued PT services  PT Problem List Decreased mobility;Pain;Impaired sensation;Decreased balance;Decreased cognition;Decreased activity tolerance;Cardiopulmonary status limiting activity;Decreased safety awareness;Decreased strength       PT Treatment Interventions Therapeutic activities;Gait training;Therapeutic exercise;Patient/family education;Balance training;Stair training;Functional mobility training;Cognitive remediation    PT Goals (Current goals can be found in the Care Plan section)  Acute Rehab PT Goals Patient Stated Goal: none stated PT Goal Formulation: With patient Time For Goal Achievement: 08/02/20 Potential to Achieve Goals: Fair    Frequency Min 3X/week   Barriers to discharge        Co-evaluation               AM-PAC PT "6 Clicks" Mobility  Outcome Measure Help  needed turning from your back to your side while in a flat bed without using bedrails?: None Help needed moving from lying on your back to sitting on the side of a flat bed without using bedrails?: None Help needed moving to and from a bed to a chair (including a wheelchair)?: A Little Help needed standing up from a chair using your arms (e.g., wheelchair or bedside chair)?: None Help needed to walk in hospital room?: A Little Help needed climbing 3-5 steps with a railing? : A Little 6 Click Score: 21    End of Session Equipment Utilized During Treatment: Oxygen;Gait belt (3L) Activity Tolerance: Patient limited by pain Patient left: in bed;with call bell/phone within reach;with bed alarm set Nurse Communication: Mobility status PT Visit Diagnosis: Muscle weakness (generalized) (M62.81);Other abnormalities of gait and mobility (R26.89);Other symptoms and signs involving the nervous system (R29.898);Pain Pain - part of body:  (headache)    Time: 4656-8127 PT Time Calculation (min) (ACUTE ONLY): 28 min   Charges:   PT Evaluation $PT Eval Moderate Complexity: 1 Mod PT Treatments $Therapeutic Activity: 8-22 mins       Gloriann Loan, SPT  Acute Rehabilitation Services  Office: [phone]  07/19/2020, 5:09 PM

## 2020-07-19 NOTE — Discharge Instructions (Signed)

## 2020-07-19 NOTE — Progress Notes (Addendum)
PROGRESS NOTE    Teresa Wiley  ZOX:[id number] DOB: 07-09-1956 DOA: 07/18/2020 PCP: Teresa Ginger, PA-C    Brief Narrative: HPI per Dr. Lorin Mercy on 07/18/2020  Teresa Wiley is a 64 y.o. female with medical history significant of pulmonary HTN on 2.5L home O2; class 2 obesity (BMI 35.87); HTN; DM; and chronic back pain presenting with AMS.  She reports long-standing h/o sweating and vomiting with bowel movements.  She took her meds last night and about 0100 she had abdominal cramping.  She was moving her bowels and got clammy and sweaty.  She lost consciousness, unable to see anything, wasn't coherent.  She doesn't think it was because she took too much medicine (takes 4 Percocets a day and denies taking extra).  She is having her usual pain right now.  Mild chest discomfort.  Feels "kind of out of it."During prior hospitalization she was diagnosed with PNA and took antibiotics - she thinks she developed a yeast infection due to itching but that resolved.   She has to push her abdomen to urinate chronically, has been discussed with PCP and possibly related to chronic constipation.  Patient brought to the ED after she was found unresponsive on her toilet at home. Placed on nonrebreather by EMS, uses 3 L oxygen baseline at home. Upon arrival she was quite somnolent and had pinpoint pupils. It is reported that she is on a lot of opiates and benzos at home. She was admitted a month ago for severe sepsis secondary to pneumonia. Repeat chest x-ray appears improved. Head CT negative. UA suggestive of infection and ceftriaxone given. Patient now more awake and alert. Currently satting well on her baseline oxygen.  Assessment & Plan:   Principal Problem:   Syncope, vasovagal Active Problems:   Class 2 obesity due to excess calories with body mass index (BMI) of 35.0 to 35.9 in adult   HTN (hypertension)   Chronic pain syndrome   Diabetes type 2, controlled ([place])   AKI (acute kidney  injury) ([place])   #1 vaso vagal syncope-likely secondary to constipation and having a large BM. However she complains of heavy weight on her chest, she has history of pulmonary artery hypertension, she saw a [facility] cardiologist who advised her that she needs cath however they have not followed up with her or she has not followed up with them. It is noted that upon arrival to the ER she was confused somnolent and had pinpoint pupils and she is on opiates and benzos at home. She was recently treated for pneumonia with chest x-ray showing improvement. She has had multiple hospital admissions and ER visits. UA suggested infection she got 1 dose of ceftriaxone urine culture pending. I have consulted Dr. Einar Gip who will be seeing the patient today. On Lasix 40 mg twice a day Consult PT Echo in 02/12/2020 EF 55 to 60%.  #2aki -resolved with IV fluids.  ACE inhibitor Lasix on hold.  #3 chronic pain syndrome she is on oxycodone/Neurontin/baclofen prior to admission.  #4 type 2 diabetes-on Metformin and semaglutide at home which is on hold. CBG (last 3)  Recent Labs    07/18/20 2141 07/19/20 0812 07/19/20 1156  GLUCAP 119* 106* 99     #5 history of essential hypertension on Coreg and Zestril 2.5 mg daily  #6 history of PE on Eliquis  #7 chronic hypoxic respiratory failure/pulmonary artery hypertension/morbid obesity on home O2 2.5 L 24/7.   Estimated body mass index is 35.87 kg/m as calculated from the following:  Height as of this encounter: 5\' 10"  (1.778 m).   Weight as of this encounter: 113.4 kg.  DVT prophylaxis: Eliquis Code Status: Full code Family Communication: No family at bedside  disposition Plan:  Status is: Inpatient Dispo: The patient is from: Home              Anticipated d/c is to: Home              Anticipated d/c date is: 2 days              Patient currently is not medically stable to d/c.    Consultants: Cardiology  Procedures:none Antimicrobials:  none  Subjective: She has numerous complaints.  She complains of a headache she complains that she cannot see well in both eyes she has chest pain she is short of breath she cannot walk few steps and she feels short of breath she has to rest.  She does have a pulmonologist and has not seen him in a while.  She complains of neuropathic pain degenerative disc pain and severe deconditioning.  Objective: Vitals:   07/18/20 2231 07/19/20 0554 07/19/20 0900 07/19/20 1300  BP: 126/72 110/76 132/69 (!) 97/51  Pulse: 84 74 84 89  Resp: 18 17 16 18   Temp: 99.1 F (37.3 C) (!) 97.2 F (36.2 C) 98.9 F (37.2 C) 98.5 F (36.9 C)  TempSrc: Oral Axillary Oral Axillary  SpO2: 100% 100% 100% 97%  Weight:      Height:        Intake/Output Summary (Last 24 hours) at 07/19/2020 1417 Last data filed at 07/18/2020 1917 Gross per 24 hour  Intake --  Output 1300 ml  Net -1300 ml   Filed Weights   07/18/20 0259  Weight: 113.4 kg    Examination:  General exam: Appears calm and comfortable  Respiratory system: Diminished breath sounds at the bases to auscultation. Respiratory effort normal. Cardiovascular system: S1 & S2 heard, RRR. No JVD, murmurs, rubs, gallops or clicks.  Gastrointestinal system: Abdomen is nondistended, soft and nontender. No organomegaly or masses felt. Normal bowel sounds heard. Central nervous system: Alert and oriented. No focal neurological deficits. Extremities: 1+ pitting edema Skin: No rashes, lesions or ulcers Psychiatry: Judgement and insight appear normal. Mood & affect appropriate.     Data Reviewed: I have personally reviewed following labs and imaging studies  CBC: Recent Labs  Lab 07/18/20 0344 07/19/20 0836  WBC 7.3 4.8  NEUTROABS 4.2  --   HGB 11.2*  13.3 9.3*  HCT 38.8  39.0 32.7*  MCV 90.9 88.1  PLT 203 295   Basic Metabolic Panel: Recent Labs  Lab 07/18/20 0344 07/19/20 0836  NA 141  139 139  K 4.4  4.5 3.9  CL 101  99 105  CO2 25  27  GLUCOSE 107*  109* 99  BUN 11  13 6*  CREATININE 1.05*  1.00 0.74  CALCIUM 9.3 8.6*   GFR: Estimated Creatinine Clearance: 98.3 mL/min (by C-G formula based on SCr of 0.74 mg/dL). Liver Function Tests: Recent Labs  Lab 07/18/20 0344  AST 29  ALT 16  ALKPHOS 74  BILITOT 0.5  PROT 7.3  ALBUMIN 3.2*   No results for input(s): LIPASE, AMYLASE in the last 168 hours. No results for input(s): AMMONIA in the last 168 hours. Coagulation Profile: No results for input(s): INR, PROTIME in the last 168 hours. Cardiac Enzymes: No results for input(s): CKTOTAL, CKMB, CKMBINDEX, TROPONINI in the last 168 hours. BNP (  last 3 results) No results for input(s): PROBNP in the last 8760 hours. HbA1C: No results for input(s): HGBA1C in the last 72 hours. CBG: Recent Labs  Lab 07/18/20 1632 07/18/20 1708 07/18/20 2141 07/19/20 0812 07/19/20 1156  GLUCAP 66* 75 119* 106* 99   Lipid Profile: No results for input(s): CHOL, HDL, LDLCALC, TRIG, CHOLHDL, LDLDIRECT in the last 72 hours. Thyroid Function Tests: Recent Labs    07/18/20 0929  TSH 0.602   Anemia Panel: No results for input(s): VITAMINB12, FOLATE, FERRITIN, TIBC, IRON, RETICCTPCT in the last 72 hours. Sepsis Labs: No results for input(s): PROCALCITON, LATICACIDVEN in the last 168 hours.  Recent Results (from the past 240 hour(s))  SARS Coronavirus 2 by RT PCR (hospital order, performed in [facility] hospital lab) Nasopharyngeal Nasopharyngeal Swab     Status: None   Collection Time: 07/18/20  5:22 AM   Specimen: Nasopharyngeal Swab  Result Value Ref Range Status   SARS Coronavirus 2 NEGATIVE NEGATIVE Final    Comment: (NOTE) SARS-CoV-2 target nucleic acids are NOT DETECTED.  The SARS-CoV-2 RNA is generally detectable in upper and lower respiratory specimens during the acute phase of infection. The lowest concentration of SARS-CoV-2 viral copies this assay can detect is 250 copies / mL. A negative result does not  preclude SARS-CoV-2 infection and should not be used as the sole basis for treatment or other patient management decisions.  A negative result may occur with improper specimen collection / handling, submission of specimen other than nasopharyngeal swab, presence of viral mutation(s) within the areas targeted by this assay, and inadequate number of viral copies (<250 copies / mL). A negative result must be combined with clinical observations, patient history, and epidemiological information.  Fact Sheet for Patients:   StrictlyIdeas.no  Fact Sheet for Healthcare Providers: BankingDealers.co.za  This test is not yet approved or  cleared by the [place] FDA and has been authorized for detection and/or diagnosis of SARS-CoV-2 by FDA under an Emergency Use Authorization (EUA).  This EUA will remain in effect (meaning this test can be used) for the duration of the COVID-19 declaration under Section 564(b)(1) of the Act, 21 U.S.C. section 360bbb-3(b)(1), unless the authorization is terminated or revoked sooner.  Performed at [facility], [place] [address] [place], [place] [address]          Radiology Studies: CT Head Wo Contrast  Result Date: 07/18/2020 CLINICAL DATA:  Simple syncope with normal neuro exam EXAM: CT HEAD WITHOUT CONTRAST TECHNIQUE: Contiguous axial images were obtained from the base of the skull through the vertex without intravenous contrast. COMPARISON:  02/09/2019 FINDINGS: Brain: No evidence of acute infarction, hemorrhage, hydrocephalus, extra-axial collection or mass lesion/mass effect. Vascular: No hyperdense vessel or unexpected calcification. Skull: Normal. Negative for fracture or focal lesion. Sinuses/Orbits: Negative IMPRESSION: Negative head CT. Electronically Signed   By: Monte Fantasia M.D.   On: 07/18/2020 04:23   DG Chest Portable 1 View  Result Date: 07/18/2020 CLINICAL DATA:  Syncope EXAM:  PORTABLE CHEST 1 VIEW COMPARISON:  07/01/2020 FINDINGS: Pulmonary insufflation is normal and symmetric. Focal pulmonary infiltrate within the right lung base has improved, though has not yet completely resolved. Minimal left basilar atelectasis is noted. No pneumothorax or pleural effusion. Cardiac size within normal limits. The pulmonary vascularity is normal. IMPRESSION: Improving right basilar focal pulmonary infiltrate. Follow-up to complete resolution is recommended. Electronically Signed   By: Fidela Salisbury MD   On: 07/18/2020 03:22  Scheduled Meds: . ALPRAZolam  1 mg Oral TID  . apixaban  5 mg Oral BID  . baclofen  10 mg Oral TID  . docusate sodium  100 mg Oral BID  . gabapentin  600 mg Oral TID  . insulin aspart  0-15 Units Subcutaneous TID WC  . insulin aspart  0-5 Units Subcutaneous QHS  . oxyCODONE-acetaminophen  1 tablet Oral QID   And  . oxyCODONE  5 mg Oral QID  . pantoprazole  40 mg Oral Daily  . sodium chloride flush  3 mL Intravenous Q12H   Continuous Infusions: . sodium chloride 125 mL/hr at 07/19/20 1207     LOS: 0 days     Georgette Shell, MD 07/19/2020, 2:17 PM
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
## 2020-07-20 LAB — GLUCOSE, CAPILLARY
Glucose-Capillary: 178 mg/dL — ABNORMAL HIGH (ref 70–99)
Glucose-Capillary: 95 mg/dL (ref 70–99)

## 2020-07-20 LAB — EXTRACTABLE NUCLEAR ANTIGEN ANTIBODY
ENA SM Ab Ser-aCnc: <0.2 AI (ref 0.0–0.9)
Ribonucleic Protein: <0.2 AI (ref 0.0–0.9)
SSA (Ro) (ENA) Antibody, IgG: <0.2 AI (ref 0.0–0.9)
SSB (La) (ENA) Antibody, IgG: <0.2 AI (ref 0.0–0.9)
Scleroderma (Scl-70) (ENA) Antibody, IgG: <0.2 AI (ref 0.0–0.9)
ds DNA Ab: <1 IU/mL (ref 0–9)

## 2020-07-20 LAB — ANTI-JO 1 ANTIBODY, IGG: Anti JO-1: <0.2 AI (ref 0.0–0.9)

## 2020-07-20 LAB — ANTINUCLEAR ANTIBODIES, IFA: ANA Ab, IFA: NEGATIVE

## 2020-07-20 LAB — SEDIMENTATION RATE: Sed Rate: 43 mm/hr — ABNORMAL HIGH (ref 0–22)

## 2020-07-20 MED ORDER — OXYCODONE HCL 5 MG PO TABS: 5.0000 mg | ORAL_TABLET | Freq: Four times a day (QID) | ORAL | Status: DC

## 2020-07-20 MED ORDER — OXYCODONE HCL 5 MG PO TABS
5.0000 mg | ORAL_TABLET | Freq: Four times a day (QID) | ORAL | Status: DC
  Administered 2020-07-20: 5 mg via ORAL
  Filled 2020-07-20: qty 1

## 2020-07-20 MED ORDER — OXYCODONE-ACETAMINOPHEN 5-325 MG PO TABS: 1.0000 | ORAL_TABLET | Freq: Four times a day (QID) | ORAL | Status: DC | PRN

## 2020-07-20 MED ORDER — OXYCODONE-ACETAMINOPHEN 5-325 MG PO TABS
1.0000 | ORAL_TABLET | Freq: Four times a day (QID) | ORAL | Status: DC
  Administered 2020-07-20: 1 via ORAL
  Filled 2020-07-20: qty 1

## 2020-07-20 NOTE — Progress Notes (Signed)
Discharge instructions (including medications) discussed with and copy provided to patient/caregiver 

## 2020-07-20 NOTE — Discharge Summary (Signed)
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 viral mutation(s) within the areas targeted by this assay, and inadequate number of viral copies (<250 copies / mL). A negative result must be combined with clinical observations, patient history, and epidemiological information.  Fact Sheet for Patients:   StrictlyIdeas.no  Fact Sheet for Healthcare Providers: BankingDealers.co.za  This test is not yet approved or  cleared by the [place] FDA and has been authorized for detection and/or diagnosis of SARS-CoV-2 by FDA under an Emergency Use Authorization (EUA).  This EUA will remain in  effect (meaning this test can be used) for the duration of the COVID-19 declaration under Section 564(b)(1) of the Act, 21 U.S.C. section 360bbb-3(b)(1), unless the authorization is terminated  or revoked sooner.  Performed at [facility], [place] [address], [place], [place] [address]      Time coordinating discharge:  39 minutes  SIGNED:   Georgette Shell, MD  [facility] 07/20/2020, 10:23 AM Pager   If 7PM-7AM, please contact night-coverage [email or web address] Password [email or web address]

## 2020-07-20 NOTE — Evaluation (Signed)
Occupational Therapy Evaluation Patient Details Name: Teresa Wiley MRN: [id number] DOB: 03/05/1956 Today's Date: 07/20/2020    History of Present Illness Patient is a 64 y/o female who presents with AMS and loss of consciousness after a bowel movement; likely vasovagal response. Also with AKI and sepsis. PMH includes HTN, pulmonary HTN, CHF, DM, chronic anticoagulation, PE in 2017, chronic pain syndrome, CVA, anxiety, polypharmacy.   Clinical Impression   Pt admitted with the above diagnoses and presents with below problem list. Pt currently min guard with LB ADLs and functional transfers/mobility. Energy conservation education including handout provided to pt. No further acute OT needs indicate. Pt awaiting d/c home today.      Follow Up Recommendations  Supervision - Intermittent;No OT follow up    Equipment Recommendations       Recommendations for Other Services       Precautions / Restrictions Precautions Precautions: Fall Precaution Comments: watch 02, chronic pain Restrictions Weight Bearing Restrictions: No      Mobility Bed Mobility               General bed mobility comments: sitting EOB  Transfers                      Balance                                           ADL either performed or assessed with clinical judgement   ADL Overall ADL's : Needs assistance/impaired Eating/Feeding: Set up;Sitting   Grooming: Set up;Sitting   Upper Body Bathing: Set up;Sitting   Lower Body Bathing: Min guard;Sit to/from stand   Upper Body Dressing : Set up;Sitting   Lower Body Dressing: Min guard;Sit to/from stand   Toilet Transfer: Min guard   Toileting- Water quality scientist and Hygiene: Min guard   Tub/ Banker: Clinical biochemist ADL Comments: Educated on [employer] conservation and provided Psychologist, sport and exercise      Pertinent Vitals/Pain Pain Assessment:  Faces Faces Pain Scale: Hurts little more Pain Location: generalized Pain Descriptors / Indicators: Aching Pain Intervention(s): Monitored during session     Hand Dominance Right   Extremity/Trunk Assessment Upper Extremity Assessment Upper Extremity Assessment: Generalized weakness   Lower Extremity Assessment Lower Extremity Assessment: Defer to PT evaluation       Communication Communication Communication: No difficulties   Cognition Arousal/Alertness: Awake/alert Behavior During Therapy: WFL for tasks assessed/performed Overall Cognitive Status: No family/caregiver present to determine baseline cognitive functioning                                     General Comments       Exercises     Shoulder Instructions      Home Living Family/patient expects to be discharged to:: Private residence Living Arrangements: Spouse/significant other;Children Available Help at Discharge: Family;Available 24 hours/day Type of Home: House Home Access: Stairs to enter [employer] of Steps: 2 Entrance Stairs-Rails: None Home Layout: Two level Alternate Level Stairs-Number of Steps: 14 Alternate Level Stairs-Rails: Right Bathroom Shower/Tub: Teacher, early years/pre: Standard     Home Equipment: Environmental consultant - 2 wheels;Shower seat   Additional Comments:  Reports partner is sick so they help each other at home. Reports son and grandon are "lazy."      Prior Functioning/Environment Level of Independence: Needs assistance  Gait / Transfers Assistance Needed: independent with ambulation. wears 2.5L of O2 at home ADL's / Homemaking Assistance Needed: Does own ADLs. Does not cook/clean due to SOB.            OT Problem List: Decreased activity tolerance;Impaired balance (sitting and/or standing);Pain;Decreased knowledge of precautions;Decreased knowledge of use of DME or AE      OT Treatment/Interventions:      OT Goals(Current goals can be  found in the care plan section) Acute Rehab OT Goals Patient Stated Goal: none stated  OT Frequency:     Barriers to D/C:            Co-evaluation              AM-PAC OT "6 Clicks" Daily Activity     Outcome Measure Help from another person eating meals?: None Help from another person taking care of personal grooming?: None Help from another person toileting, which includes using toliet, bedpan, or urinal?: None Help from another person bathing (including washing, rinsing, drying)?: None Help from another person to put on and taking off regular upper body clothing?: None Help from another person to put on and taking off regular lower body clothing?: None 6 Click Score: 24   End of Session Equipment Utilized During Treatment: Oxygen  Activity Tolerance: Patient limited by fatigue;Patient tolerated treatment well Patient left: in bed;with call bell/phone within reach;Other (comment) (EOB)  OT Visit Diagnosis: Pain;Muscle weakness (generalized) (M62.81)                Time: 9371-6967 OT Time Calculation (min): 8 min Charges:  OT General Charges $OT Visit: 1 Visit OT Evaluation $OT Eval Low Complexity: [place], OT Acute Rehabilitation Services Pager: [phone] Office: [phone]   Hortencia Pilar 07/20/2020, 12:57 PM

## 2020-07-20 NOTE — Plan of Care (Signed)
  Problem: Education: Goal: Knowledge of General Education information will improve Description: Including pain rating scale, medication(s)/side effects and non-pharmacologic comfort measures Outcome: Adequate for Discharge   

## 2020-07-20 NOTE — Progress Notes (Signed)
Discharged by SWAT nurse

## 2020-09-12 ENCOUNTER — Telehealth: Payer: Self-pay | Admitting: Internal Medicine

## 2020-09-12 NOTE — Telephone Encounter (Signed)
Patient was a no show for 06/05/20.
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
## 2021-03-12 ENCOUNTER — Inpatient Hospital Stay (HOSPITAL_COMMUNITY)
Admission: RE | Admit: 2021-03-12 | Discharge: 2021-03-12 | Disposition: A | Discharge status: Home / Self Care | Payer: Medicaid Other | Source: Ambulatory Visit | Attending: Internal Medicine | Admitting: Internal Medicine

## 2021-05-11 ENCOUNTER — Ambulatory Visit: Payer: Medicaid Other | Admitting: Internal Medicine

## 2021-05-22 ENCOUNTER — Ambulatory Visit: Payer: Medicaid Other | Admitting: Internal Medicine

## 2021-06-06 ENCOUNTER — Other Ambulatory Visit: Payer: Self-pay

## 2021-06-06 ENCOUNTER — Encounter: Payer: Self-pay | Admitting: Primary Care

## 2021-06-06 ENCOUNTER — Ambulatory Visit (INDEPENDENT_AMBULATORY_CARE_PROVIDER_SITE_OTHER): Payer: Medicaid Other | Admitting: Primary Care

## 2021-06-06 VITALS — BP 124/86 | HR 100 | Ht 70.0 in | Wt 253.0 lb

## 2021-06-06 DIAGNOSIS — R0982 Postnasal drip: Secondary | ICD-10-CM | POA: Diagnosis not present

## 2021-06-06 DIAGNOSIS — I272 Pulmonary hypertension, unspecified: Secondary | ICD-10-CM

## 2021-06-06 DIAGNOSIS — J9611 Chronic respiratory failure with hypoxia: Secondary | ICD-10-CM | POA: Diagnosis not present

## 2021-06-06 DIAGNOSIS — G4733 Obstructive sleep apnea (adult) (pediatric): Secondary | ICD-10-CM

## 2021-06-06 MED ORDER — LORATADINE 10 MG PO TABS: 10.0000 mg | ORAL_TABLET | Freq: Every day | ORAL | 5 refills | Status: DC

## 2021-06-06 MED ORDER — FLUTICASONE PROPIONATE 50 MCG/ACT NA SUSP: 1.0000 | Freq: Every day | NASAL | 2 refills | Status: DC

## 2021-06-06 NOTE — Patient Instructions (Addendum)
  Recommendations: - Start loratadine 10mg  daily  - Start flonase nasal spray   Orders: - DME order to have her concentrator serviced, best fit for O2/POC  - Request sleep study records from [facility]   Follow-up: 1-2 months with Dr. Shearon Stalls or her first available
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
## 2021-06-07 ENCOUNTER — Encounter: Payer: Self-pay | Admitting: Primary Care

## 2021-06-07 DIAGNOSIS — R0982 Postnasal drip: Secondary | ICD-10-CM | POA: Insufficient documentation

## 2021-06-07 NOTE — Assessment & Plan Note (Signed)
-   Recommend trial Flonase nasal spray and Loratadine 10mg  qd

## 2021-06-07 NOTE — Assessment & Plan Note (Addendum)
-   Patient presents to office today off oxygen, her O2 was 86% RA. She is unable to use her home concentrator to fill oxygen tanks. She is interested in getting a portable concentrator so she can travel.  - Placing DME order to have her concentrator serviced, best fit for O2/POC with [employer]

## 2021-06-07 NOTE — Assessment & Plan Note (Addendum)
-   Secondary pulmonary HTN, group 2. Split night sleep study was negative for OSA. She reports compliance with Lasix 20mg  daily. She no showed for apt to establish with Dr. Haroldine Laws, will needs to discuss at follow-up

## 2021-06-12 ENCOUNTER — Emergency Department (HOSPITAL_BASED_OUTPATIENT_CLINIC_OR_DEPARTMENT_OTHER): Payer: Medicaid Other

## 2021-06-12 ENCOUNTER — Observation Stay (HOSPITAL_BASED_OUTPATIENT_CLINIC_OR_DEPARTMENT_OTHER)
Admission: EM | Admit: 2021-06-12 | Discharge: 2021-06-14 | Disposition: A | Discharge status: Home / Self Care | Payer: Medicaid Other | Attending: Internal Medicine | Admitting: Internal Medicine

## 2021-06-12 ENCOUNTER — Encounter (HOSPITAL_BASED_OUTPATIENT_CLINIC_OR_DEPARTMENT_OTHER): Payer: Self-pay

## 2021-06-12 ENCOUNTER — Other Ambulatory Visit: Payer: Self-pay

## 2021-06-12 ENCOUNTER — Emergency Department: Payer: Medicaid Other

## 2021-06-12 DIAGNOSIS — Z7984 Long term (current) use of oral hypoglycemic drugs: Secondary | ICD-10-CM | POA: Insufficient documentation

## 2021-06-12 DIAGNOSIS — Z79899 Other long term (current) drug therapy: Secondary | ICD-10-CM | POA: Insufficient documentation

## 2021-06-12 DIAGNOSIS — I2699 Other pulmonary embolism without acute cor pulmonale: Secondary | ICD-10-CM | POA: Diagnosis not present

## 2021-06-12 DIAGNOSIS — I110 Hypertensive heart disease with heart failure: Secondary | ICD-10-CM | POA: Diagnosis not present

## 2021-06-12 DIAGNOSIS — E119 Type 2 diabetes mellitus without complications: Secondary | ICD-10-CM | POA: Insufficient documentation

## 2021-06-12 DIAGNOSIS — R52 Pain, unspecified: Secondary | ICD-10-CM

## 2021-06-12 DIAGNOSIS — I5033 Acute on chronic diastolic (congestive) heart failure: Secondary | ICD-10-CM | POA: Diagnosis not present

## 2021-06-12 DIAGNOSIS — N179 Acute kidney failure, unspecified: Secondary | ICD-10-CM | POA: Diagnosis not present

## 2021-06-12 DIAGNOSIS — Z87891 Personal history of nicotine dependence: Secondary | ICD-10-CM | POA: Insufficient documentation

## 2021-06-12 DIAGNOSIS — R55 Syncope and collapse: Principal | ICD-10-CM | POA: Insufficient documentation

## 2021-06-12 DIAGNOSIS — J45909 Unspecified asthma, uncomplicated: Secondary | ICD-10-CM | POA: Diagnosis not present

## 2021-06-12 DIAGNOSIS — Z20822 Contact with and (suspected) exposure to covid-19: Secondary | ICD-10-CM | POA: Diagnosis not present

## 2021-06-12 DIAGNOSIS — Z7901 Long term (current) use of anticoagulants: Secondary | ICD-10-CM | POA: Diagnosis not present

## 2021-06-12 LAB — LACTIC ACID, PLASMA
Lactic Acid, Venous: 1.1 mmol/L (ref 0.5–1.9)
Lactic Acid, Venous: 1 mmol/L (ref 0.5–1.9)

## 2021-06-12 LAB — RESP PANEL BY RT-PCR (FLU A&B, COVID) ARPGX2
Influenza A by PCR: NEGATIVE
Influenza B by PCR: NEGATIVE
SARS Coronavirus 2 by RT PCR: NEGATIVE

## 2021-06-12 LAB — CBC WITH DIFFERENTIAL/PLATELET
Abs Immature Granulocytes: 0.02 10*3/uL (ref 0.00–0.07)
Basophils Absolute: 0 10*3/uL (ref 0.0–0.1)
Basophils Relative: 0 %
Eosinophils Absolute: 0.2 10*3/uL (ref 0.0–0.5)
Eosinophils Relative: 2 %
HCT: 35.4 % — ABNORMAL LOW (ref 36.0–46.0)
Hemoglobin: 10.2 g/dL — ABNORMAL LOW (ref 12.0–15.0)
Immature Granulocytes: 0 %
Lymphocytes Relative: 18 %
Lymphs Abs: 1.4 10*3/uL (ref 0.7–4.0)
MCH: 23.5 pg — ABNORMAL LOW (ref 26.0–34.0)
MCHC: 28.8 g/dL — ABNORMAL LOW (ref 30.0–36.0)
MCV: 81.6 fL (ref 80.0–100.0)
Monocytes Absolute: 1 10*3/uL (ref 0.1–1.0)
Monocytes Relative: 12 %
Neutro Abs: 5.2 10*3/uL (ref 1.7–7.7)
Neutrophils Relative %: 68 %
Platelets: 419 10*3/uL — ABNORMAL HIGH (ref 150–400)
RBC: 4.34 MIL/uL (ref 3.87–5.11)
RDW: 15.9 % — ABNORMAL HIGH (ref 11.5–15.5)
WBC: 7.8 10*3/uL (ref 4.0–10.5)
nRBC: 0 % (ref 0.0–0.2)

## 2021-06-12 LAB — CBG MONITORING, ED: Glucose-Capillary: 135 mg/dL — ABNORMAL HIGH (ref 70–99)

## 2021-06-12 LAB — HEPATIC FUNCTION PANEL
ALT: 10 U/L (ref 0–44)
AST: 17 U/L (ref 15–41)
Albumin: 3.6 g/dL (ref 3.5–5.0)
Alkaline Phosphatase: 87 U/L (ref 38–126)
Bilirubin, Direct: 0.1 mg/dL (ref 0.0–0.2)
Indirect Bilirubin: 0.3 mg/dL (ref 0.3–0.9)
Total Bilirubin: 0.4 mg/dL (ref 0.3–1.2)
Total Protein: 8 g/dL (ref 6.5–8.1)

## 2021-06-12 LAB — BASIC METABOLIC PANEL
Anion gap: 7 (ref 5–15)
BUN: 30 mg/dL — ABNORMAL HIGH (ref 8–23)
CO2: 28 mmol/L (ref 22–32)
Calcium: 8.7 mg/dL — ABNORMAL LOW (ref 8.9–10.3)
Chloride: 100 mmol/L (ref 98–111)
Creatinine, Ser: 2.18 mg/dL — ABNORMAL HIGH (ref 0.44–1.00)
GFR, Estimated: 25 mL/min — ABNORMAL LOW (ref >60)
Glucose, Bld: 145 mg/dL — ABNORMAL HIGH (ref 70–99)
Potassium: 4.3 mmol/L (ref 3.5–5.1)
Sodium: 135 mmol/L (ref 135–145)

## 2021-06-12 LAB — BRAIN NATRIURETIC PEPTIDE: B Natriuretic Peptide: 26.6 pg/mL (ref 0.0–100.0)

## 2021-06-12 LAB — TROPONIN I (HIGH SENSITIVITY)
Troponin I (High Sensitivity): <2 ng/L (ref <18)
Troponin I (High Sensitivity): <2 ng/L (ref <18)

## 2021-06-12 MED ORDER — FENTANYL CITRATE (PF) 100 MCG/2ML IJ SOLN
50.0000 ug | Freq: Once | INTRAMUSCULAR | Status: AC
  Administered 2021-06-12: 50 ug via INTRAVENOUS
  Filled 2021-06-12: qty 2

## 2021-06-12 MED ORDER — GABAPENTIN 300 MG PO CAPS
300.0000 mg | ORAL_CAPSULE | Freq: Two times a day (BID) | ORAL | Status: DC
  Administered 2021-06-13 – 2021-06-14 (×4): 300 mg via ORAL
  Filled 2021-06-12 (×4): qty 1

## 2021-06-12 MED ORDER — ALBUTEROL SULFATE HFA 108 (90 BASE) MCG/ACT IN AERS
2.0000 | INHALATION_SPRAY | Freq: Four times a day (QID) | RESPIRATORY_TRACT | Status: DC
  Filled 2021-06-12: qty 6.7

## 2021-06-12 MED ORDER — LORAZEPAM 2 MG/ML IJ SOLN
0.5000 mg | INTRAMUSCULAR | Status: AC
  Administered 2021-06-13: 0.5 mg via INTRAVENOUS
  Filled 2021-06-12: qty 1

## 2021-06-12 MED ORDER — PANTOPRAZOLE SODIUM 40 MG PO TBEC
40.0000 mg | DELAYED_RELEASE_TABLET | Freq: Every day | ORAL | Status: DC
  Administered 2021-06-13 – 2021-06-14 (×2): 40 mg via ORAL
  Filled 2021-06-12 (×2): qty 1

## 2021-06-12 MED ORDER — ALBUTEROL SULFATE (2.5 MG/3ML) 0.083% IN NEBU: 2.5000 mg | INHALATION_SOLUTION | Freq: Four times a day (QID) | RESPIRATORY_TRACT | Status: DC | PRN

## 2021-06-12 MED ORDER — FENTANYL CITRATE (PF) 100 MCG/2ML IJ SOLN
50.0000 ug | Freq: Once | INTRAMUSCULAR | Status: AC
  Administered 2021-06-12: 50 ug via INTRAVENOUS
  Filled 2021-06-12 (×2): qty 2

## 2021-06-12 MED ORDER — SODIUM CHLORIDE 0.9 % IV BOLUS: 500.0000 mL | Freq: Once | INTRAVENOUS | Status: DC

## 2021-06-12 MED ORDER — SODIUM CHLORIDE 0.9 % IV SOLN: INTRAVENOUS | Status: AC

## 2021-06-12 MED ORDER — ALBUTEROL SULFATE HFA 108 (90 BASE) MCG/ACT IN AERS
INHALATION_SPRAY | RESPIRATORY_TRACT | Status: AC
  Administered 2021-06-12: 2 via RESPIRATORY_TRACT
  Filled 2021-06-12: qty 6.7

## 2021-06-12 MED ORDER — APIXABAN 5 MG PO TABS
5.0000 mg | ORAL_TABLET | Freq: Two times a day (BID) | ORAL | Status: DC
  Administered 2021-06-13 – 2021-06-14 (×4): 5 mg via ORAL
  Filled 2021-06-12 (×4): qty 1

## 2021-06-12 MED ORDER — SODIUM CHLORIDE 0.9 % IV SOLN: Freq: Once | INTRAVENOUS | Status: AC

## 2021-06-12 MED ORDER — SODIUM CHLORIDE 0.9 % IV BOLUS
1000.0000 mL | Freq: Once | INTRAVENOUS | Status: AC
  Administered 2021-06-12: 1000 mL via INTRAVENOUS

## 2021-06-12 MED ORDER — ALPRAZOLAM 0.5 MG PO TABS
0.5000 mg | ORAL_TABLET | Freq: Two times a day (BID) | ORAL | Status: DC | PRN
Start: 2021-06-12 — End: 2021-06-14
  Administered 2021-06-13 (×2): 0.5 mg via ORAL
  Filled 2021-06-12 (×2): qty 1

## 2021-06-12 NOTE — ED Notes (Signed)
Pt with vague multiple complaints. Pt states she had a syncopal episode yesterday after she took her O2 off to cook. Pt reports pain in her neck and back. Chest pain described as constant heavy pain in her chest x 3 days. Pt also c/o neuropathic pain in her feet. Pt states she does not check her BGL regularly. Pt currently 89% SpO2 on 2L.

## 2021-06-12 NOTE — ED Triage Notes (Addendum)
Pt c/o CP, SOB, "body pain"-states she is being seen by cards and was to have a heart monitor placed but is waiting for hypoallergenic pads-to tx room via w/c-pt states she is in on O2 24/7 however did not come to ED with O2-sats 85%-placed on O2 2LNC by RT

## 2021-06-12 NOTE — ED Notes (Signed)
SpO2 82% on room air. Patient wears 2-2.5 LPM at home.   Placed on 2 LPM, SpO2 97% now

## 2021-06-12 NOTE — ED Notes (Signed)
Patient transported to CT on the cardiac monitor with this RN
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
## 2021-06-13 ENCOUNTER — Observation Stay (HOSPITAL_COMMUNITY): Payer: Medicaid Other

## 2021-06-13 DIAGNOSIS — R55 Syncope and collapse: Secondary | ICD-10-CM | POA: Diagnosis not present

## 2021-06-13 LAB — URINALYSIS, ROUTINE W REFLEX MICROSCOPIC
Bilirubin Urine: NEGATIVE
Glucose, UA: NEGATIVE mg/dL
Hgb urine dipstick: NEGATIVE
Ketones, ur: NEGATIVE mg/dL
Leukocytes,Ua: NEGATIVE
Nitrite: NEGATIVE
Protein, ur: NEGATIVE mg/dL
Specific Gravity, Urine: 1.014 (ref 1.005–1.030)
pH: 5 (ref 5.0–8.0)

## 2021-06-13 LAB — BASIC METABOLIC PANEL
Anion gap: 9 (ref 5–15)
BUN: 21 mg/dL (ref 8–23)
CO2: 26 mmol/L (ref 22–32)
Calcium: 9 mg/dL (ref 8.9–10.3)
Chloride: 104 mmol/L (ref 98–111)
Creatinine, Ser: 1.17 mg/dL — ABNORMAL HIGH (ref 0.44–1.00)
GFR, Estimated: 52 mL/min — ABNORMAL LOW (ref >60)
Glucose, Bld: 149 mg/dL — ABNORMAL HIGH (ref 70–99)
Potassium: 4.4 mmol/L (ref 3.5–5.1)
Sodium: 139 mmol/L (ref 135–145)

## 2021-06-13 LAB — CBC
HCT: 36.8 % (ref 36.0–46.0)
Hemoglobin: 10 g/dL — ABNORMAL LOW (ref 12.0–15.0)
MCH: 22.8 pg — ABNORMAL LOW (ref 26.0–34.0)
MCHC: 27.2 g/dL — ABNORMAL LOW (ref 30.0–36.0)
MCV: 84 fL (ref 80.0–100.0)
Platelets: 374 10*3/uL (ref 150–400)
RBC: 4.38 MIL/uL (ref 3.87–5.11)
RDW: 16 % — ABNORMAL HIGH (ref 11.5–15.5)
WBC: 5.3 10*3/uL (ref 4.0–10.5)
nRBC: 0 % (ref 0.0–0.2)

## 2021-06-13 LAB — PROCALCITONIN: Procalcitonin: <0.1 ng/mL

## 2021-06-13 MED ORDER — CYCLOBENZAPRINE HCL 10 MG PO TABS: 5.0000 mg | ORAL_TABLET | Freq: Three times a day (TID) | ORAL | Status: DC | PRN

## 2021-06-13 MED ORDER — OXYCODONE-ACETAMINOPHEN 10-325 MG PO TABS: 1.0000 | ORAL_TABLET | Freq: Three times a day (TID) | ORAL | Status: DC | PRN

## 2021-06-13 MED ORDER — OXYCODONE HCL 5 MG PO TABS
5.0000 mg | ORAL_TABLET | Freq: Three times a day (TID) | ORAL | Status: DC | PRN
  Administered 2021-06-13 – 2021-06-14 (×3): 5 mg via ORAL
  Filled 2021-06-13 (×3): qty 1

## 2021-06-13 MED ORDER — SILDENAFIL CITRATE 20 MG PO TABS
20.0000 mg | ORAL_TABLET | Freq: Three times a day (TID) | ORAL | Status: DC
  Administered 2021-06-13 – 2021-06-14 (×3): 20 mg via ORAL
  Filled 2021-06-13 (×3): qty 1

## 2021-06-13 MED ORDER — SIMETHICONE 80 MG PO CHEW
80.0000 mg | CHEWABLE_TABLET | Freq: Four times a day (QID) | ORAL | Status: DC | PRN
  Administered 2021-06-13: 80 mg via ORAL
  Filled 2021-06-13: qty 1

## 2021-06-13 MED ORDER — MELATONIN 5 MG PO TABS
5.0000 mg | ORAL_TABLET | Freq: Every evening | ORAL | Status: DC | PRN
  Administered 2021-06-14: 5 mg via ORAL
  Filled 2021-06-13: qty 1

## 2021-06-13 MED ORDER — HYDROMORPHONE HCL 1 MG/ML IJ SOLN
0.5000 mg | Freq: Once | INTRAMUSCULAR | Status: AC
  Administered 2021-06-13: 0.5 mg via INTRAVENOUS
  Filled 2021-06-13: qty 1

## 2021-06-13 MED ORDER — SIMETHICONE 80 MG PO CHEW
80.0000 mg | CHEWABLE_TABLET | Freq: Once | ORAL | Status: AC
  Administered 2021-06-13: 80 mg via ORAL
  Filled 2021-06-13: qty 1

## 2021-06-13 MED ORDER — OXYCODONE-ACETAMINOPHEN 5-325 MG PO TABS
1.0000 | ORAL_TABLET | Freq: Three times a day (TID) | ORAL | Status: DC | PRN
Start: 2021-06-13 — End: 2021-06-14
  Administered 2021-06-13 – 2021-06-14 (×3): 1 via ORAL
  Filled 2021-06-13 (×3): qty 1

## 2021-06-13 NOTE — Evaluation (Signed)
Physical Therapy Evaluation Patient Details Name: Teresa Wiley MRN: [id number] DOB: September 13, 1956 Today's Date: 06/13/2021   History of Present Illness  65 y.o. female presenting to [facility] on 7/29 due to near syncopal episode. Work-up revealed hypotension.  Troponin negative x2.  Possible pulmonary edema versus active infection on imagings. PMH: former smoker, asthma, pulmonary hypertension, essential hypertension, PE, chronic hypoxemia on 2 L [place], obesity, HFpEF 55 to 60%.  Clinical Impression  Pt performs bed mobility without physical assistance but with HOB elevated significantly. Pt tolerates ambulation of household distances, preferring to push IV pole and reports furniture walking/grabbing onto the walls in the home PTA. Pt reports that she takes off her home o2 with mobility as she is unable to transport her o2. Pt demonstrates mild instability during gait activities and increased WOB, asking about her o2 sat levels. Pt may benefit from gait trial with rollator to address energy conservation and improvements in reports of SOB. Pt with deficits in overall strength, endurance, power, activity tolerance, and balance and will benefit from acute PT to improve safety and independence with mobility. SPT recommends HHPT to assist in increasing activity tolerance and energy conservation.     Follow Up Recommendations Home health PT;Supervision - Intermittent    Equipment Recommendations  None recommended by PT    Recommendations for Other Services       Precautions / Restrictions Precautions Precautions: Fall Precaution Comments: Watch O2 Restrictions Weight Bearing Restrictions: No      Mobility  Bed Mobility Overal bed mobility: Needs Assistance Bed Mobility: Supine to Sit     Supine to sit: Supervision;HOB elevated     General bed mobility comments: In recliner on entry    Transfers Overall transfer level: Needs assistance Equipment used: None Transfers: Sit to/from Stand Sit  to Stand: Supervision         General transfer comment: Pt able to power up from lower surfaces with no assist, needs arm rest to push up from  Ambulation/Gait Ambulation/Gait assistance: Supervision;Min guard Gait Distance (Feet): 400 Feet Assistive device: IV Pole Gait Pattern/deviations: Step-through pattern;Trunk flexed;Drifts right/left Gait velocity: decreased Gait velocity interpretation: 1.31 - 2.62 ft/sec, indicative of limited community ambulator General Gait Details: Pt ambulates with step-through gait maintaining o2 sats on 2 L. Pt with preferencec to push IV pole during session and reports ambulation to be improved compared to baseline.  Stairs            Wheelchair Mobility    Modified Rankin (Stroke Patients Only)       Balance Overall balance assessment: Mild deficits observed, not formally tested Sitting-balance support: Feet supported Sitting balance-Leahy Scale: Fair     Standing balance support: During functional activity;Single extremity supported Standing balance-Leahy Scale: Poor Standing balance comment: Pt reliant on at least single UE support                             Pertinent Vitals/Pain Pain Assessment: No/denies pain Pain Score: 8  Pain Location:  (pt reports pain from neck to feet and headache.) Pain Descriptors / Indicators: Discomfort Pain Intervention(s): Monitored during session;Limited activity within patient's tolerance    Home Living Family/patient expects to be discharged to:: Private residence Living Arrangements: Spouse/significant other;Children Available Help at Discharge: Family;Available PRN/intermittently Type of Home: House Home Access: Stairs to enter Entrance Stairs-Rails: None Entrance Stairs-Number of Steps: 2 Home Layout: Two level Home Equipment: Shower seat Additional Comments: Reports partner is sick so  they help each other at home. Reports son and grandon are "lazy."    Prior Function  Level of Independence: Needs assistance   Gait / Transfers Assistance Needed: independent with ambulation. wears 2.5L of O2 at home  ADL's / Homemaking Assistance Needed: Does own ADLs. Does not cook/clean due to SOB.        Hand Dominance   Dominant Hand: Right    Extremity/Trunk Assessment   Upper Extremity Assessment Upper Extremity Assessment: Generalized weakness;LUE deficits/detail LUE Deficits / Details: L shoulder weak, ROM WFL, MMT 3/5 LUE Sensation: decreased light touch LUE Coordination: WNL    Lower Extremity Assessment Lower Extremity Assessment: Defer to PT evaluation    Cervical / Trunk Assessment Cervical / Trunk Assessment: Normal  Communication   Communication: No difficulties  Cognition Arousal/Alertness: Awake/alert Behavior During Therapy: WFL for tasks assessed/performed Overall Cognitive Status: Within Functional Limits for tasks assessed                                 General Comments: Pt tearful when discussing home situation      General Comments General comments (skin integrity, edema, etc.): VSS on 3L, O2 99%, HR 88, BP 128/76    Exercises     Assessment/Plan    PT Assessment Patient needs continued PT services  PT Problem List Decreased strength;Decreased activity tolerance;Decreased balance;Decreased mobility;Decreased knowledge of use of DME;Decreased safety awareness;Decreased knowledge of precautions;Pain       PT Treatment Interventions DME instruction;Gait training;Stair training;Functional mobility training;Therapeutic activities;Therapeutic exercise;Balance training;Patient/family education    PT Goals (Current goals can be found in the Care Plan section)  Acute Rehab PT Goals Patient Stated Goal: To go stay at her daughters PT Goal Formulation: With patient Time For Goal Achievement: 06/27/21 Potential to Achieve Goals: Good    Frequency Min 3X/week   Barriers to discharge        Co-evaluation                AM-PAC PT "6 Clicks" Mobility  Outcome Measure Help needed turning from your back to your side while in a flat bed without using bedrails?: None Help needed moving from lying on your back to sitting on the side of a flat bed without using bedrails?: A Little Help needed moving to and from a bed to a chair (including a wheelchair)?: A Little Help needed standing up from a chair using your arms (e.g., wheelchair or bedside chair)?: A Little Help needed to walk in hospital room?: A Little Help needed climbing 3-5 steps with a railing? : A Little 6 Click Score: 19    End of Session Equipment Utilized During Treatment: Gait belt;Oxygen Activity Tolerance: Patient tolerated treatment well Patient left: in chair;with call bell/phone within reach;with chair alarm set Nurse Communication: Mobility status PT Visit Diagnosis: Unsteadiness on feet (R26.81);Other abnormalities of gait and mobility (R26.89);Muscle weakness (generalized) (M62.81);History of falling (Z91.81);Dizziness and giddiness (R42);Pain Pain - Right/Left:  (neck to feet) Pain - part of body:  (neck to feet)    Time: 6073-7106 PT Time Calculation (min) (ACUTE ONLY): 56 min   Charges:   PT Evaluation $PT Eval Low Complexity: 1 Low          Acute Rehab  Pager: [phone]   Garwin Brothers, SPT  06/13/2021, 11:51 AM

## 2021-06-13 NOTE — Progress Notes (Signed)
Patient is admitted early this morning, detail please refer to H&P. She is seen and examined, reported left-sided weakness, initial CT head scan no acute findings, she is not able to get MRI due to claustrophobia, will repeat CT head scan to rule out acute cva Aki, cr improved, she has no edema, bp low normal, continue hold lasix and lisinopril, repeat bmp in am, likely able to resume lasix and lisinopril at discharge Likely able to d/c home with home health tomorrow if ct head is negative and cr stable.
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
## 2021-06-13 NOTE — Evaluation (Signed)
Occupational Therapy Evaluation Patient Details Name: Teresa Wiley MRN: [id number] DOB: 1955-12-08 Today's Date: 06/13/2021    History of Present Illness 65 y.o. female presenting to [facility] on 7/29 due to near syncopal episode. Work-up revealed hypotension.  Troponin negative x2.  Possible pulmonary edema versus active infection on imagings. PMH: former smoker, asthma, pulmonary hypertension, essential hypertension, PE, chronic hypoxemia on 2 L [place], obesity, HFpEF 55 to 60%.   Clinical Impression   Pt admitted for concerns listed above. PTA pt reported that she was independent with all ADL's, however was not able to tolerate cooking and cleaning due to decreased activity tolerance. This  session pt demonstrated decreased activity tolerance and increased weakness limiting her ability to perform ADL's. Pt reports that she wants to be able to take care of herself and feels "lazy" due to not being able to assist her partner in cooking and cleaning. Pt agreeable to HHOT to assist with compensatory techniques for ADL's and assist with improving activity tolerance and strength. Acute OT will follow to address concerns listed below.     Follow Up Recommendations  Home health OT;Supervision - Intermittent    Equipment Recommendations  None recommended by OT    Recommendations for Other Services       Precautions / Restrictions Precautions Precautions: Fall Precaution Comments: Watch O2 Restrictions Weight Bearing Restrictions: No      Mobility Bed Mobility               General bed mobility comments: In recliner on entry    Transfers Overall transfer level: Needs assistance Equipment used: None Transfers: Sit to/from Stand Sit to Stand: Supervision         General transfer comment: Pt able to power up from lower surfaces with no assist, needs arm rest to push up from    Balance Overall balance assessment: Mild deficits observed, not formally tested                                          ADL either performed or assessed with clinical judgement   ADL Overall ADL's : Needs assistance/impaired Eating/Feeding: Independent;Sitting   Grooming: Independent;Sitting   Upper Body Bathing: Supervision/ safety;Sitting   Lower Body Bathing: Min guard;Sitting/lateral leans;Sit to/from stand   Upper Body Dressing : Independent;Sitting   Lower Body Dressing: Supervision/safety;Sitting/lateral leans;Sit to/from stand   Toilet Transfer: Supervision/safety;Ambulation   Toileting- Clothing Manipulation and Hygiene: Supervision/safety;Sitting/lateral lean;Sit to/from stand   Tub/ Shower Transfer: Supervision/safety;Ambulation   Functional mobility during ADLs: Min guard General ADL Comments: Pt does not use AD for ambulation, requiring min G for safety, most other ADL's are supervision level for safety.     Vision Baseline Vision/History: Wears glasses Wears Glasses: Reading only Patient Visual Report: No change from baseline Vision Assessment?: No apparent visual deficits     Perception Perception Perception Tested?: No   Praxis Praxis Praxis tested?: Not tested    Pertinent Vitals/Pain Pain Assessment: No/denies pain     Hand Dominance Right   Extremity/Trunk Assessment Upper Extremity Assessment Upper Extremity Assessment: Generalized weakness;LUE deficits/detail LUE Deficits / Details: L shoulder weak, ROM WFL, MMT 3/5 LUE Sensation: decreased light touch LUE Coordination: WNL   Lower Extremity Assessment Lower Extremity Assessment: Defer to PT evaluation   Cervical / Trunk Assessment Cervical / Trunk Assessment: Normal   Communication Communication Communication: No difficulties   Cognition Arousal/Alertness: Awake/alert Behavior  During Therapy: WFL for tasks assessed/performed Overall Cognitive Status: Within Functional Limits for tasks assessed                                 General Comments: Pt  tearful when discussing home situation   General Comments  VSS on 3L, O2 99%, HR 88, BP 128/76    Exercises     Shoulder Instructions      Home Living Family/patient expects to be discharged to:: Private residence Living Arrangements: Spouse/significant other;Children Available Help at Discharge: Family;Available PRN/intermittently Type of Home: House Home Access: Stairs to enter [employer] of Steps: 2 Entrance Stairs-Rails: None Home Layout: Two level Alternate Level Stairs-Number of Steps: 14 Alternate Level Stairs-Rails: Right Bathroom Shower/Tub: Teacher, early years/pre: Handicapped height Bathroom Accessibility: No   Home Equipment: Shower seat   Additional Comments: Reports partner is sick so they help each other at home. Reports son and grandon are "lazy."      Prior Functioning/Environment Level of Independence: Needs assistance  Gait / Transfers Assistance Needed: independent with ambulation. wears 2.5L of O2 at home ADL's / Homemaking Assistance Needed: Does own ADLs. Does not cook/clean due to SOB.            OT Problem List: Decreased strength;Decreased activity tolerance;Impaired balance (sitting and/or standing);Decreased coordination;Decreased safety awareness;Decreased knowledge of use of DME or AE;Cardiopulmonary status limiting activity;Impaired UE functional use      OT Treatment/Interventions: Self-care/ADL training;Therapeutic exercise;Energy conservation;DME and/or AE instruction;Therapeutic activities;Patient/family education;Balance training    OT Goals(Current goals can be found in the care plan section) Acute Rehab OT Goals Patient Stated Goal: To go stay at her daughters OT Goal Formulation: With patient Time For Goal Achievement: 06/27/21 Potential to Achieve Goals: Good ADL Goals Pt Will Perform Grooming: Independently;standing Pt Will Perform Lower Body Bathing: Independently;sit to/from stand;sitting/lateral  leans Pt Will Perform Lower Body Dressing: Independently;sitting/lateral leans;sit to/from stand Pt Will Transfer to Toilet: Independently;ambulating Pt Will Perform Toileting - Clothing Manipulation and hygiene: Independently;sitting/lateral leans;sit to/from stand Additional ADL Goal #1: Pt will tolerate 5 mins of OOB activities to improve activity tolerance.  OT Frequency: Min 2X/week   Barriers to D/C:    Pt reporting poor support system at home, planning to stay at daughters home alone upon discharge to relieve some stress.       Co-evaluation              AM-PAC OT "6 Clicks" Daily Activity     Outcome Measure Help from another person eating meals?: None Help from another person taking care of personal grooming?: None Help from another person toileting, which includes using toliet, bedpan, or urinal?: A Little Help from another person bathing (including washing, rinsing, drying)?: A Little Help from another person to put on and taking off regular upper body clothing?: None Help from another person to put on and taking off regular lower body clothing?: A Little 6 Click Score: 21   End of Session Equipment Utilized During Treatment: Oxygen Nurse Communication: Mobility status  Activity Tolerance: Patient tolerated treatment well Patient left: in chair;with call bell/phone within reach  OT Visit Diagnosis: Unsteadiness on feet (R26.81);History of falling (Z91.81);Muscle weakness (generalized) (M62.81)                Time: 6578-4696 OT Time Calculation (min): 28 min Charges:  OT General Charges $OT Visit: 1 Visit OT Evaluation $OT Eval Moderate Complexity:  1 Mod OT Treatments $Self Care/Home Management : 8-22 mins  Hope Holst H., OTR/L Acute Rehabilitation  Jenisse Vullo Elane Kashaun Bebo 06/13/2021, 10:18 AM

## 2021-06-14 DIAGNOSIS — J9611 Chronic respiratory failure with hypoxia: Secondary | ICD-10-CM

## 2021-06-14 DIAGNOSIS — N179 Acute kidney failure, unspecified: Secondary | ICD-10-CM | POA: Diagnosis not present

## 2021-06-14 DIAGNOSIS — R55 Syncope and collapse: Secondary | ICD-10-CM | POA: Diagnosis not present

## 2021-06-14 LAB — PROCALCITONIN: Procalcitonin: <0.1 ng/mL

## 2021-06-14 LAB — BASIC METABOLIC PANEL
Anion gap: 6 (ref 5–15)
BUN: 17 mg/dL (ref 8–23)
CO2: 29 mmol/L (ref 22–32)
Calcium: 9.1 mg/dL (ref 8.9–10.3)
Chloride: 104 mmol/L (ref 98–111)
Creatinine, Ser: 0.94 mg/dL (ref 0.44–1.00)
GFR, Estimated: >60 mL/min (ref >60)
Glucose, Bld: 97 mg/dL (ref 70–99)
Potassium: 4.7 mmol/L (ref 3.5–5.1)
Sodium: 139 mmol/L (ref 135–145)

## 2021-06-14 MED ORDER — SILDENAFIL CITRATE 20 MG PO TABS: 20.0000 mg | ORAL_TABLET | Freq: Three times a day (TID) | ORAL | 0 refills | Status: DC

## 2021-06-14 NOTE — Discharge Summary (Signed)
Discharge Summary  Teresa Wiley EUM:[id number] DOB: 07/12/1956  PCP: Secundino Ginger, PA-C  Admit date: 06/12/2021 Discharge date: 06/14/2021  Time spent: 12mins  Recommendations for Outpatient Follow-up:  F/u with PCP within a week  for hospital discharge follow up, repeat cbc/bmp at follow up F/u with cardiology/pulmonary hypertension clinic Home health ordered    Discharge Diagnoses:  Active Hospital Problems   Diagnosis Date Noted   Near syncope 11/17/2018    Resolved Hospital Problems  No resolved problems to display.    Discharge Condition: stable  Diet recommendation: heart healthy/diabetes diet  Filed Weights   06/12/21 1544  Weight: 118.4 kg    History of present illness: (Per admitting provider Dr. Nevada Crane) Chief Complaint: Near syncope.   HPI: Teresa Wiley is a 65 y.o. female with medical history significant for former smoker quit in 2015, 20 pack/year history, asthma, pulmonary hypertension, essential hypertension, pulmonary embolism, chronic hypoxemia on 2 L oxygen nasal cannula, obesity, HFpEF 55 to 60% who presented as a direct admit to [facility], 6 E unit due to near syncopal episode at home.  Patient reports for the last 3 days she has been having worsening neck and lower back pain.  The pain was especially worse on the day of her presentation which caused her to fall, she is unsure whether or not she blacked out.  Also reports some chest pain vaguely and dyspnea, nonproductive cough which she attributes to her pulmonary hypertension.  States the cough is chronic and has not changed.  Work-up revealed hypotension.  Troponin negative x2.  Possible pulmonary edema versus active infection on imagings.  TRH, hospitalist team, was asked to admit.   ED Course:  Temperature 99.1.  BP 104/70, pulse 87, respiratory 18, O2 saturation 100% on 3 L.  Lab studies remarkable for serum sodium 135, potassium 4.3, serum bicarb 28, glucose 145, BUN 30, creatinine 2.18, troponin <2,  repeat troponin <2.  Hospital Course:  Active Problems:   Near syncope  Near syncope, with orthostatic hypotension/AKI -Telemetry unremarkable, she received hydration, holding Lasix and lisinopril -She has improved, feeling much better, desires to go home, creatinine has normalized.  Resume home dose Lasix and spironolactone, follow-up with PCP to monitor renal function  Patient report has history of recurrent syncope, she is referred to cardiology for outpatient cardiac monitor, she said heart monitor has scheduled for her she need to go pick it up She is to follow-up with cardiology for this  Left-sided weakness CT head x2 no acute findings Report chronic back pain , recommend follow-up with spine surgery  Chronic pain syndrome Continue home pain medication  Pulmonary hypertension Continue sildenafil Follow-up with pulmonary hypertension clinic  History of PE on Eliquis, continue  Chronic hypoxic respiratory failure, continue home O2  Noninsulin-dependent type 2 diabetes Continue Ozempic  Body mass index is 37.45 kg/m.   Procedures: None  Consultations: Case management  Discharge Exam: BP 128/77 (BP Location: Right Wrist)   Pulse 74   Temp 98.1 F (36.7 C) (Oral)   Resp 20   Ht 5\' 10"  (1.778 m)   Wt 118.4 kg   SpO2 100%   BMI 37.45 kg/m   General: NAD, AAOx3, pleasant Cardiovascular: RRR, no edema Respiratory: Clear to auscultation bilaterally  Discharge Instructions You were cared for by a hospitalist during your hospital stay. If you have any questions about your discharge medications or the care you received while you were in the hospital after you are discharged, you can call the unit and  asked to speak with the hospitalist on call if the hospitalist that took care of you is not available. Once you are discharged, your primary care physician will handle any further medical issues. Please note that NO REFILLS for any discharge medications will be  authorized once you are discharged, as it is imperative that you return to your primary care physician (or establish a relationship with a primary care physician if you do not have one) for your aftercare needs so that they can reassess your need for medications and monitor your lab values.  Discharge Instructions     Diet - low sodium heart healthy   Complete by: As directed    Carb modified diet   Increase activity slowly   Complete by: As directed       Allergies as of 06/14/2021       Reactions   Buprenorphine Hcl Hives   Pt immediately c/o itching and hives after administration of 4mg  Morphine IV, required Benadryl IV for relief.   Erythromycin Anaphylaxis, Nausea And Vomiting   Iodine Hives, Other (See Comments)   Patient reports not having anaphylaxis to iodine. That was entered in mistake previously. REACTION: Hives, throat closes   Morphine And Related Hives   Pt immediately c/o itching and hives after administration of 4mg  Morphine IV, required Benadryl IV for relief.   Shellfish Allergy Anaphylaxis, Hives   Shellfish-derived Products Hives        Medication List     TAKE these medications    acetaminophen 500 MG tablet Commonly known as: TYLENOL Take 1,000 mg by mouth every 6 (six) hours as needed for headache (pain).   albuterol 108 (90 Base) MCG/ACT inhaler Commonly known as: VENTOLIN HFA Inhale 2 puffs into the lungs every 6 (six) hours as needed for wheezing or shortness of breath. For shortness of breath   ALPRAZolam 1 MG tablet Commonly known as: XANAX Take 1 mg by mouth 3 (three) times daily as needed for anxiety.   apixaban 5 MG Tabs tablet Commonly known as: ELIQUIS Take 5 mg by mouth 2 (two) times daily.   diphenhydramine-acetaminophen 25-500 MG Tabs tablet Commonly known as: TYLENOL PM Take 1 tablet by mouth at bedtime as needed (sleep).   esomeprazole 20 MG capsule Commonly known as: NEXIUM Take 20 mg by mouth every evening.    fluticasone 50 MCG/ACT nasal spray Commonly known as: FLONASE Place 1 spray into both nostrils daily.   furosemide 20 MG tablet Commonly known as: LASIX Take 20 mg by mouth daily.   gabapentin 600 MG tablet Commonly known as: NEURONTIN Take 600 mg by mouth 3 (three) times daily.   lisinopril 2.5 MG tablet Commonly known as: ZESTRIL Take 2.5 mg by mouth daily.   loratadine 10 MG tablet Commonly known as: CLARITIN Take 1 tablet (10 mg total) by mouth daily.   oxyCODONE-acetaminophen 10-325 MG tablet Commonly known as: PERCOCET Take 1 tablet by mouth 4 (four) times daily.   OXYGEN Inhale 2 L into the lungs continuous.   Ozempic (1 MG/DOSE) 4 MG/3ML Sopn Generic drug: Semaglutide (1 MG/DOSE) Inject 1 mg into the skin every Thursday.   sildenafil 20 MG tablet Commonly known as: REVATIO Take 1 tablet (20 mg total) by mouth 3 (three) times daily. What changed: when to take this   Vitamin D (Ergocalciferol) 1.25 MG (50000 UNIT) Caps capsule Commonly known as: DRISDOL Take 50,000 Units by mouth every Wednesday.       Allergies  Allergen Reactions  Buprenorphine Hcl Hives    Pt immediately c/o itching and hives after administration of 4mg  Morphine IV, required Benadryl IV for relief.    Erythromycin Anaphylaxis and Nausea And Vomiting   Iodine Hives and Other (See Comments)    Patient reports not having anaphylaxis to iodine. That was entered in mistake previously. REACTION: Hives, throat closes    Morphine And Related Hives    Pt immediately c/o itching and hives after administration of 4mg  Morphine IV, required Benadryl IV for relief.    Shellfish Allergy Anaphylaxis and Hives   Shellfish-Derived Products Hives    Follow-up Information     Secundino Ginger, PA-C Follow up in 1 week(s).   Specialty: Cardiology Why: hospital discharge follow up, pcp to monitor kidney function Contact information: [facility]  [address] [place]   [id number] [phone]                  The results of significant diagnostics from this hospitalization (including imaging, microbiology, ancillary and laboratory) are listed below for reference.    Significant Diagnostic Studies: CT HEAD WO CONTRAST  Result Date: 06/13/2021 CLINICAL DATA:  Stroke suspected. Fall with headache previously reported. EXAM: CT HEAD WITHOUT CONTRAST TECHNIQUE: Contiguous axial images were obtained from the base of the skull through the vertex without intravenous contrast. COMPARISON:  06/12/2021.  07/18/2020. FINDINGS: Brain: The brain shows a normal appearance without evidence of malformation, atrophy, old or acute small or large vessel infarction, mass lesion, hemorrhage, hydrocephalus or extra-axial collection. Vascular: No hyperdense vessel. No evidence of atherosclerotic calcification. Skull: Normal.  No traumatic finding.  No focal bone lesion. Sinuses/Orbits: Sinuses are clear. Orbits appear normal. Mastoids are clear. Other: None significant IMPRESSION: Normal head CT.  No change. Electronically Signed   By: Nelson Chimes M.D.   On: 06/13/2021 14:37   CT Head Wo Contrast  Result Date: 06/12/2021 CLINICAL DATA:  Post fall with headache. EXAM: CT HEAD WITHOUT CONTRAST TECHNIQUE: Contiguous axial images were obtained from the base of the skull through the vertex without intravenous contrast. COMPARISON:  07/18/2020 FINDINGS: Brain: No intracranial hemorrhage, mass effect, or midline shift. No hydrocephalus. The basilar cisterns are patent. No evidence of territorial infarct or acute ischemia. No extra-axial or intracranial fluid collection. Vascular: Choose Skull: No fracture or focal lesion. Sinuses/Orbits: Paranasal sinuses and mastoid air cells are clear. The visualized orbits are unremarkable. Other: None. IMPRESSION: Negative noncontrast head CT. Electronically Signed   By: Keith Rake M.D.   On: 06/12/2021 17:40   CT CHEST WO CONTRAST  Result Date:  06/12/2021 CLINICAL DATA:  Syncope, neck and back pain, chest heaviness for 3 days EXAM: CT CHEST WITHOUT CONTRAST TECHNIQUE: Multidetector CT imaging of the chest was performed following the standard protocol without IV contrast. COMPARISON:  02/10/2019, 06/13/2019 FINDINGS: Cardiovascular: Unenhanced imaging of the heart and great vessels demonstrates no pericardial effusion. Normal caliber of the thoracic aorta. There is prominence of the central bilateral pulmonary artery. Evaluation of the vascular lumen limited without IV contrast. Mediastinum/Nodes: No enlarged mediastinal or axillary lymph nodes. Thyroid gland, trachea, and esophagus demonstrate no significant findings. Moderate hiatal hernia. Lungs/Pleura: There is patchy ground-glass airspace disease within the right lower lobe, which could reflect asymmetric edema or infection. No effusion or pneumothorax. Central airways are patent. Upper Abdomen: No acute abnormality. Musculoskeletal: No acute or destructive bony lesions. Reconstructed images demonstrate no additional findings. IMPRESSION: 1. Patchy ground-glass airspace disease within the right lower lobe, which may reflect  asymmetric edema or infection. 2. Prominence of the central pulmonary vasculature, which could reflect underlying pulmonary arterial hypertension or volume overload. 3. Hiatal hernia. Electronically Signed   By: Randa Ngo M.D.   On: 06/12/2021 17:44   CT Cervical Spine Wo Contrast  Result Date: 06/12/2021 CLINICAL DATA:  Post fall, syncopal episode yesterday.  Neck pain. EXAM: CT CERVICAL SPINE WITHOUT CONTRAST TECHNIQUE: Multidetector CT imaging of the cervical spine was performed without intravenous contrast. Multiplanar CT image reconstructions were also generated. COMPARISON:  CT cervical spine 07/18/2014 FINDINGS: Alignment: Straightening of normal lordosis. No traumatic subluxation. Skull base and vertebrae: No acute fracture. Vertebral body heights are maintained.  The dens and skull base are intact. Soft tissues and spinal canal: No prevertebral fluid or swelling. No visible canal hematoma. Disc levels: Degenerative disc disease C4-C5, C5-C6, and C6-C7. Occasional facet hypertrophy. Upper chest: Nonacute. Other: None. IMPRESSION: Degenerative change in the cervical spine without acute fracture or subluxation. Electronically Signed   By: Keith Rake M.D.   On: 06/12/2021 17:44   CT T-SPINE NO CHARGE  Result Date: 06/12/2021 CLINICAL DATA:  Back pain.  Syncopal episode yesterday. EXAM: CT THORACIC SPINE WITHOUT CONTRAST TECHNIQUE: Multidetector CT images of the thoracic were obtained using the standard protocol without intravenous contrast. COMPARISON:  Chest CT 02/10/2019 FINDINGS: Alignment: Mild thoracic levoscoliosis.  No listhesis. Vertebrae: No acute fracture or suspicious osseous lesion. Hemangiomas in the T11 and T12 vertebral bodies. Paraspinal and other soft tissues: No acute abnormality identified in the paraspinal soft tissues. Intrathoracic contents reported separately on today's chest CT. Disc levels: Mild thoracic spondylosis and facet arthrosis. IMPRESSION: No evidence of acute osseous abnormality in the thoracic spine. Electronically Signed   By: Logan Bores M.D.   On: 06/12/2021 17:52   DG Chest Portable 1 View  Result Date: 06/12/2021 CLINICAL DATA:  Chest pain. EXAM: PORTABLE CHEST 1 VIEW COMPARISON:  July 19, 2020. FINDINGS: The heart size and mediastinal contours are within normal limits. Left lung is clear. Right infrahilar opacity is noted concerning for pneumonia or atelectasis. The visualized skeletal structures are unremarkable. IMPRESSION: Right infrahilar pneumonia or atelectasis. Followup PA and lateral chest X-ray is recommended in 3-4 weeks following trial of antibiotic therapy to ensure resolution and exclude underlying malignancy. Electronically Signed   By: Marijo Conception M.D.   On: 06/12/2021 16:49    Microbiology: Recent  Results (from the past 240 hour(s))  Resp Panel by RT-PCR (Flu A&B, Covid) Nasopharyngeal Swab     Status: None   Collection Time: 06/12/21  4:02 PM   Specimen: Nasopharyngeal Swab; Nasopharyngeal(NP) swabs in vial transport medium  Result Value Ref Range Status   SARS Coronavirus 2 by RT PCR NEGATIVE NEGATIVE Final    Comment: (NOTE) SARS-CoV-2 target nucleic acids are NOT DETECTED.  The SARS-CoV-2 RNA is generally detectable in upper respiratory specimens during the acute phase of infection. The lowest concentration of SARS-CoV-2 viral copies this assay can detect is 138 copies/mL. A negative result does not preclude SARS-Cov-2 infection and should not be used as the sole basis for treatment or other patient management decisions. A negative result may occur with  improper specimen collection/handling, submission of specimen other than nasopharyngeal swab, presence of viral mutation(s) within the areas targeted by this assay, and inadequate number of viral copies(<138 copies/mL). A negative result must be combined with clinical observations, patient history, and epidemiological information. The expected result is Negative.  Fact Sheet for Patients:  EntrepreneurPulse.com.au  Fact Sheet for  Healthcare Providers:  IncredibleEmployment.be  This test is no t yet approved or cleared by the [place] and  has been authorized for detection and/or diagnosis of SARS-CoV-2 by FDA under an Emergency Use Authorization (EUA). This EUA will remain  in effect (meaning this test can be used) for the duration of the COVID-19 declaration under Section 564(b)(1) of the Act, 21 U.S.C.section 360bbb-3(b)(1), unless the authorization is terminated  or revoked sooner.       Influenza A by PCR NEGATIVE NEGATIVE Final   Influenza B by PCR NEGATIVE NEGATIVE Final    Comment: (NOTE) The Xpert Xpress SARS-CoV-2/FLU/RSV plus assay is intended as an aid in the  diagnosis of influenza from Nasopharyngeal swab specimens and should not be used as a sole basis for treatment. Nasal washings and aspirates are unacceptable for Xpert Xpress SARS-CoV-2/FLU/RSV testing.  Fact Sheet for Patients: EntrepreneurPulse.com.au  Fact Sheet for Healthcare Providers: IncredibleEmployment.be  This test is not yet approved or cleared by the [place] FDA and has been authorized for detection and/or diagnosis of SARS-CoV-2 by FDA under an Emergency Use Authorization (EUA). This EUA will remain in effect (meaning this test can be used) for the duration of the COVID-19 declaration under Section 564(b)(1) of the Act, 21 U.S.C. section 360bbb-3(b)(1), unless the authorization is terminated or revoked.  Performed at [facility], [address] [place], [place] [address]   Culture, blood (single)     Status: None (Preliminary result)   Collection Time: 06/12/21  5:17 PM   Specimen: BLOOD  Result Value Ref Range Status   Specimen Description   Final    BLOOD BLOOD LEFT ARM UPPER Performed at [facility], [address] [place], [place] [address]    Special Requests   Final    BOTTLES DRAWN AEROBIC AND ANAEROBIC Blood Culture adequate volume Performed at [facility], [address] [place], [place] [address]    Culture   Final    NO GROWTH < 12 HOURS Performed at [facility], [place] [address], [place], [place] [address]    Report Status PENDING  Incomplete  Urine Culture     Status: Abnormal (Preliminary result)   Collection Time: 06/12/21 10:03 PM   Specimen: Urine, Clean Catch  Result Value Ref Range Status   Specimen Description URINE, CLEAN CATCH  Final   Special Requests Normal  Final   Culture (A)  Final    50,000 COLONIES/mL STAPHYLOCOCCUS EPIDERMIDIS SUSCEPTIBILITIES TO FOLLOW Performed at [facility], [place] [address], [place], [place] [address]    Report  Status PENDING  Incomplete     Labs: Basic Metabolic Panel: Recent Labs  Lab 06/12/21 1602 06/13/21 0209 06/14/21 0432  NA 135 139 139  K 4.3 4.4 4.7  CL 100 104 104  CO2 28 26 29   GLUCOSE 145* 149* 97  BUN 30* 21 17  CREATININE 2.18* 1.17* 0.94  CALCIUM 8.7* 9.0 9.1   Liver Function Tests: Recent Labs  Lab 06/12/21 1602  AST 17  ALT 10  ALKPHOS 87  BILITOT 0.4  PROT 8.0  ALBUMIN 3.6   No results for input(s): LIPASE, AMYLASE in the last 168 hours. No results for input(s): AMMONIA in the last 168 hours. CBC: Recent Labs  Lab 06/12/21 1602 06/13/21 0209  WBC 7.8 5.3  NEUTROABS 5.2  --   HGB 10.2* 10.0*  HCT 35.4* 36.8  MCV 81.6 84.0  PLT 419*  374   Cardiac Enzymes: No results for input(s): CKTOTAL, CKMB, CKMBINDEX, TROPONINI in the last 168 hours. BNP: BNP (last 3 results) Recent Labs    07/01/20 1845 06/12/21 1602  BNP 70.1 26.6    ProBNP (last 3 results) No results for input(s): PROBNP in the last 8760 hours.  CBG: Recent Labs  Lab 06/12/21 1613  GLUCAP 135*       Signed:  Florencia Reasons MD, PhD, FACP  [facility] 06/14/2021, 11:34 AM

## 2021-06-14 NOTE — Care Management (Addendum)
1424 06-14-21 Case Manager spoke with patient regarding home health needs. Patient states she will be going to [place] on Monday and that her daughter will be purchasing an [employer] tank for the [facility]. Patient has an oxygen fill station at the home and it is not filling. Case Manager called [employer] to place a ticket for them to go out to the home to service. Case Manager educated patient regarding that she will need home fill station working in order to travel to [place] safely. [employer] will deliver a portable tank to the room prior to transition home. Patient wants [facility]- Case Manager reached out to [facility] that is in [place] to see if they can service the patient. Awaiting call back. If [facility] cannot service the patient, Case Manager did make the patient aware that she will need to work with her PCP for home needs. Physician is aware of the plan. [facility] transportation will transport the patient home. No further needs from Case Manager at this time.     1442 06-14-21 Case Manager received a call from [facility], they are unable to service the patient in [place]. Patient will be there for two weeks and the will travel home after two weeks.

## 2021-06-15 LAB — URINE CULTURE
Culture: 50000 — AB
Special Requests: NORMAL

## 2021-06-17 LAB — CULTURE, BLOOD (SINGLE)
Culture: NO GROWTH
Special Requests: ADEQUATE

## 2021-06-27 ENCOUNTER — Encounter (HOSPITAL_COMMUNITY): Payer: Medicaid Other | Admitting: Internal Medicine

## 2021-07-09 ENCOUNTER — Telehealth: Payer: Self-pay | Admitting: Internal Medicine

## 2021-07-09 NOTE — Telephone Encounter (Signed)
ATC patient x1, LVM to return call.  Dr. Elsworth Soho, please advise regarding results of sleep study from July 2nd.  Thank you.

## 2021-07-09 NOTE — Telephone Encounter (Signed)
Pt is requesting for her sleep study results to be given to her stated that she did the test on May 26, 2021 at the hospital. Pls regard; [phone].

## 2021-07-13 NOTE — Telephone Encounter (Signed)
Call made to patient, confirmed DOB. Made aware of HST results. Patient has oxygen, uses at 2L continuous. Aware to use 2.5L at night. Voiced understanding.   Nothing further needed at this time.

## 2021-07-13 NOTE — Telephone Encounter (Signed)
ATC patient x1, LMTRC.  When she calls back, please provide sleep study. results/recommendations:  RECOMMENDATIONS - Sleep with 2.5 L O2 - Avoid alcohol, sedatives and other CNS depressants that may worsen sleep apnea and disrupt normal sleep architecture. - Sleep hygiene should be reviewed to assess factors that may improve sleep quality. - Weight management and regular exercise should be initiated or continued if appropriate.     Kara Mead MD Board Certified in [place]

## 2021-08-08 ENCOUNTER — Ambulatory Visit: Payer: Medicaid Other | Admitting: Internal Medicine

## 2021-08-24 ENCOUNTER — Encounter: Payer: Self-pay | Admitting: Primary Care

## 2021-08-24 DIAGNOSIS — G4733 Obstructive sleep apnea (adult) (pediatric): Secondary | ICD-10-CM

## 2021-08-24 HISTORY — DX: Obstructive sleep apnea (adult) (pediatric): G47.33

## 2021-09-05 ENCOUNTER — Ambulatory Visit: Payer: Medicaid Other | Admitting: Internal Medicine

## 2021-10-03 ENCOUNTER — Ambulatory Visit: Payer: Medicaid Other | Admitting: Internal Medicine

## 2021-10-04 ENCOUNTER — Ambulatory Visit: Payer: Medicaid Other | Admitting: Internal Medicine

## 2021-11-29 ENCOUNTER — Ambulatory Visit (HOSPITAL_COMMUNITY)
Admission: RE | Admit: 2021-11-29 | Discharge: 2021-11-29 | Disposition: A | Discharge status: Home / Self Care | Payer: Medicaid Other | Source: Ambulatory Visit | Attending: Internal Medicine | Admitting: Internal Medicine

## 2021-11-29 ENCOUNTER — Other Ambulatory Visit (HOSPITAL_COMMUNITY): Payer: Self-pay

## 2021-11-29 ENCOUNTER — Encounter (HOSPITAL_COMMUNITY): Payer: Self-pay | Admitting: Internal Medicine

## 2021-11-29 ENCOUNTER — Other Ambulatory Visit: Payer: Self-pay

## 2021-11-29 VITALS — BP 124/74 | HR 94 | Wt 270.6 lb

## 2021-11-29 DIAGNOSIS — I10 Essential (primary) hypertension: Secondary | ICD-10-CM | POA: Insufficient documentation

## 2021-11-29 DIAGNOSIS — I272 Pulmonary hypertension, unspecified: Secondary | ICD-10-CM | POA: Diagnosis not present

## 2021-11-29 DIAGNOSIS — R0789 Other chest pain: Secondary | ICD-10-CM | POA: Diagnosis not present

## 2021-11-29 DIAGNOSIS — Z87891 Personal history of nicotine dependence: Secondary | ICD-10-CM | POA: Diagnosis not present

## 2021-11-29 DIAGNOSIS — G4733 Obstructive sleep apnea (adult) (pediatric): Secondary | ICD-10-CM | POA: Insufficient documentation

## 2021-11-29 DIAGNOSIS — G894 Chronic pain syndrome: Secondary | ICD-10-CM | POA: Insufficient documentation

## 2021-11-29 DIAGNOSIS — Z86711 Personal history of pulmonary embolism: Secondary | ICD-10-CM | POA: Diagnosis not present

## 2021-11-29 DIAGNOSIS — Z6838 Body mass index (BMI) 38.0-38.9, adult: Secondary | ICD-10-CM | POA: Insufficient documentation

## 2021-11-29 DIAGNOSIS — Z9981 Dependence on supplemental oxygen: Secondary | ICD-10-CM | POA: Diagnosis not present

## 2021-11-29 DIAGNOSIS — E119 Type 2 diabetes mellitus without complications: Secondary | ICD-10-CM | POA: Diagnosis not present

## 2021-11-29 LAB — COMPREHENSIVE METABOLIC PANEL
ALT: 11 U/L (ref 0–44)
AST: 15 U/L (ref 15–41)
Albumin: 3.7 g/dL (ref 3.5–5.0)
Alkaline Phosphatase: 101 U/L (ref 38–126)
Anion gap: 7 (ref 5–15)
BUN: 11 mg/dL (ref 8–23)
CO2: 30 mmol/L (ref 22–32)
Calcium: 9.1 mg/dL (ref 8.9–10.3)
Chloride: 102 mmol/L (ref 98–111)
Creatinine, Ser: 0.97 mg/dL (ref 0.44–1.00)
GFR, Estimated: >60 mL/min (ref >60)
Glucose, Bld: 119 mg/dL — ABNORMAL HIGH (ref 70–99)
Potassium: 3.8 mmol/L (ref 3.5–5.1)
Sodium: 139 mmol/L (ref 135–145)
Total Bilirubin: 0.5 mg/dL (ref 0.3–1.2)
Total Protein: 8 g/dL (ref 6.5–8.1)

## 2021-11-29 LAB — CBC
HCT: 37.6 % (ref 36.0–46.0)
Hemoglobin: 10.8 g/dL — ABNORMAL LOW (ref 12.0–15.0)
MCH: 24.2 pg — ABNORMAL LOW (ref 26.0–34.0)
MCHC: 28.7 g/dL — ABNORMAL LOW (ref 30.0–36.0)
MCV: 84.1 fL (ref 80.0–100.0)
Platelets: 410 10*3/uL — ABNORMAL HIGH (ref 150–400)
RBC: 4.47 MIL/uL (ref 3.87–5.11)
RDW: 16.3 % — ABNORMAL HIGH (ref 11.5–15.5)
WBC: 12.9 10*3/uL — ABNORMAL HIGH (ref 4.0–10.5)
nRBC: 0 % (ref 0.0–0.2)

## 2021-11-29 MED ORDER — SODIUM CHLORIDE 0.9% FLUSH: 3.0000 mL | Freq: Two times a day (BID) | INTRAVENOUS | Status: DC
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
## 2021-11-29 NOTE — Patient Instructions (Addendum)
No change in medication  Labs done today, your results will be available in MyChart, we will contact you for abnormal readings.   You are scheduled for a Cardiac Catheterization on Tuesday, January 10 with Dr. Glori Bickers.  1. Please arrive at the [facility] (Main Entrance A) at [facility]: [address] [place], [place] [address] at 08.30am (This time is two hours before your procedure to ensure your preparation). Free valet parking service is available.   Special note: Every effort is made to have your procedure done on time. Please understand that emergencies sometimes delay scheduled procedures.  2. Diet: Do not eat solid foods after midnight.  The patient may have clear liquids until 5am upon the day of the procedure.  3. Labs: done today 11/29/2021  4. Medication instructions in preparation for your procedure:   Contrast Allergy: No   Stop taking Eliquis (Apixiban) on Sunday, January 8.  Stop taking, Lasix (Furosemide)  Tuesday, January 10,   On the morning of your procedure, take your morning medicines NOT listed above.  You may use sips of water.  5. Plan for one night stay--bring personal belongings. 6. Bring a current list of your medications and current insurance cards. 7. You MUST have a responsible person to drive you home. 8. Someone MUST be with you the first 24 hours after you arrive home or your discharge will be delayed. 9. Please wear clothes that are easy to get on and off and wear slip-on shoes.  If you have any questions or concerns before your next appointment please send [place] a message through [place] or call our office at [phone].    TO LEAVE A MESSAGE FOR THE NURSE SELECT OPTION 2, PLEASE LEAVE A MESSAGE INCLUDING: YOUR NAME DATE OF BIRTH CALL BACK NUMBER REASON FOR CALL**this is important as we prioritize the call backs  YOU WILL RECEIVE A CALL BACK THE SAME DAY AS LONG AS YOU CALL BEFORE 4:00 PM  At the [facility], you and your health needs are our priority. As part of our continuing mission to provide you with exceptional heart care, we have created designated Provider Care Teams. These Care Teams include your primary Cardiologist (physician) and Advanced Practice Providers (APPs- Physician Assistants and Nurse Practitioners) who all work together to provide you with the care you need, when you need it.   You may see any of the following providers on your designated Care Team at your next follow up: Dr Glori Bickers Dr Haynes Kerns, NP Lyda Jester, [place] [facility] [place], [place] Audry Riles, PharmD   Please be sure to bring in all your medications bottles to every appointment.
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
## 2021-12-03 ENCOUNTER — Ambulatory Visit (INDEPENDENT_AMBULATORY_CARE_PROVIDER_SITE_OTHER): Payer: Medicaid Other | Admitting: Internal Medicine

## 2021-12-03 ENCOUNTER — Other Ambulatory Visit: Payer: Self-pay

## 2021-12-03 ENCOUNTER — Encounter: Payer: Self-pay | Admitting: Internal Medicine

## 2021-12-03 VITALS — BP 100/82 | HR 87 | Temp 98.3°F | Ht 70.0 in | Wt 264.4 lb

## 2021-12-03 DIAGNOSIS — R0602 Shortness of breath: Secondary | ICD-10-CM | POA: Diagnosis not present

## 2021-12-03 DIAGNOSIS — J9611 Chronic respiratory failure with hypoxia: Secondary | ICD-10-CM | POA: Diagnosis not present

## 2021-12-03 DIAGNOSIS — I2722 Pulmonary hypertension due to left heart disease: Secondary | ICD-10-CM
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
## 2021-12-03 NOTE — Patient Instructions (Signed)
Please schedule follow up scheduled with myself in 2 months.  If my schedule is not open yet, we will contact you with a reminder closer to that time. Please call [phone] if you haven't heard from [place] a month before.   Before your next visit I would like you to have:  Full set of PFTs - 1 hour, appointment with me afterwards.    We will send a rx for a portable oxygen concentrator to [facility].

## 2021-12-04 ENCOUNTER — Encounter (HOSPITAL_COMMUNITY): Payer: Self-pay | Admitting: Internal Medicine

## 2021-12-04 ENCOUNTER — Encounter (HOSPITAL_COMMUNITY)
Admission: RE | Disposition: A | Discharge status: Home / Self Care | Payer: Self-pay | Source: Home / Self Care | Attending: Internal Medicine

## 2021-12-04 ENCOUNTER — Other Ambulatory Visit: Payer: Self-pay

## 2021-12-04 ENCOUNTER — Ambulatory Visit (HOSPITAL_COMMUNITY)
Admission: RE | Admit: 2021-12-04 | Discharge: 2021-12-04 | Disposition: A | Discharge status: Home / Self Care | Payer: Medicaid Other | Attending: Internal Medicine | Admitting: Internal Medicine

## 2021-12-04 DIAGNOSIS — Z6838 Body mass index (BMI) 38.0-38.9, adult: Secondary | ICD-10-CM | POA: Insufficient documentation

## 2021-12-04 DIAGNOSIS — E119 Type 2 diabetes mellitus without complications: Secondary | ICD-10-CM | POA: Insufficient documentation

## 2021-12-04 DIAGNOSIS — Z9981 Dependence on supplemental oxygen: Secondary | ICD-10-CM | POA: Insufficient documentation

## 2021-12-04 DIAGNOSIS — I2721 Secondary pulmonary arterial hypertension: Secondary | ICD-10-CM | POA: Insufficient documentation

## 2021-12-04 DIAGNOSIS — R0789 Other chest pain: Secondary | ICD-10-CM | POA: Diagnosis not present

## 2021-12-04 DIAGNOSIS — I10 Essential (primary) hypertension: Secondary | ICD-10-CM | POA: Diagnosis not present

## 2021-12-04 DIAGNOSIS — G894 Chronic pain syndrome: Secondary | ICD-10-CM | POA: Insufficient documentation

## 2021-12-04 DIAGNOSIS — I272 Pulmonary hypertension, unspecified: Secondary | ICD-10-CM

## 2021-12-04 HISTORY — PX: RIGHT/LEFT HEART CATH AND CORONARY ANGIOGRAPHY: CATH118266

## 2021-12-04 LAB — POCT I-STAT 7, (LYTES, BLD GAS, ICA,H+H)
Acid-Base Excess: 2 mmol/L (ref 0.0–2.0)
Bicarbonate: 28.6 mmol/L — ABNORMAL HIGH (ref 20.0–28.0)
Calcium, Ion: 1.03 mmol/L — ABNORMAL LOW (ref 1.15–1.40)
HCT: 32 % — ABNORMAL LOW (ref 36.0–46.0)
Hemoglobin: 10.9 g/dL — ABNORMAL LOW (ref 12.0–15.0)
O2 Saturation: 76 %
Potassium: 3.1 mmol/L — ABNORMAL LOW (ref 3.5–5.1)
Sodium: 145 mmol/L (ref 135–145)
TCO2: 30 mmol/L (ref 22–32)
pCO2 arterial: 53.2 mmHg — ABNORMAL HIGH (ref 32.0–48.0)
pH, Arterial: 7.338 — ABNORMAL LOW (ref 7.350–7.450)
pO2, Arterial: 44 mmHg — ABNORMAL LOW (ref 83.0–108.0)

## 2021-12-04 LAB — GLUCOSE, CAPILLARY
Glucose-Capillary: 118 mg/dL — ABNORMAL HIGH (ref 70–99)
Glucose-Capillary: 125 mg/dL — ABNORMAL HIGH (ref 70–99)

## 2021-12-04 SURGERY — RIGHT/LEFT HEART CATH AND CORONARY ANGIOGRAPHY
Anesthesia: LOCAL

## 2021-12-04 MED ORDER — SODIUM CHLORIDE 0.9 % IV SOLN
INTRAVENOUS | Status: DC
Start: 2021-12-04 — End: 2021-12-04

## 2021-12-04 MED ORDER — SODIUM CHLORIDE 0.9 % IV SOLN: 250.0000 mL | INTRAVENOUS | Status: DC | PRN

## 2021-12-04 MED ORDER — LABETALOL HCL 5 MG/ML IV SOLN: 10.0000 mg | INTRAVENOUS | Status: DC | PRN

## 2021-12-04 MED ORDER — LIDOCAINE HCL (PF) 1 % IJ SOLN
INTRAMUSCULAR | Status: DC | PRN
  Administered 2021-12-04: 2 mL

## 2021-12-04 MED ORDER — MIDAZOLAM HCL 2 MG/2ML IJ SOLN
INTRAMUSCULAR | Status: DC | PRN
  Administered 2021-12-04 (×2): 1 mg via INTRAVENOUS

## 2021-12-04 MED ORDER — VERAPAMIL HCL 2.5 MG/ML IV SOLN
INTRAVENOUS | Status: DC | PRN
  Administered 2021-12-04: 10 mL via INTRA_ARTERIAL

## 2021-12-04 MED ORDER — ASPIRIN 81 MG PO CHEW: 81.0000 mg | CHEWABLE_TABLET | ORAL | Status: DC

## 2021-12-04 MED ORDER — HEPARIN (PORCINE) IN NACL 1000-0.9 UT/500ML-% IV SOLN
INTRAVENOUS | Status: AC
  Filled 2021-12-04: qty 500

## 2021-12-04 MED ORDER — ONDANSETRON HCL 4 MG/2ML IJ SOLN: 4.0000 mg | Freq: Four times a day (QID) | INTRAMUSCULAR | Status: DC | PRN

## 2021-12-04 MED ORDER — ACETAMINOPHEN 325 MG PO TABS: 650.0000 mg | ORAL_TABLET | ORAL | Status: DC | PRN

## 2021-12-04 MED ORDER — HEPARIN (PORCINE) IN NACL 1000-0.9 UT/500ML-% IV SOLN
INTRAVENOUS | Status: DC | PRN
  Administered 2021-12-04 (×2): 500 mL

## 2021-12-04 MED ORDER — SODIUM CHLORIDE 0.9% FLUSH: 3.0000 mL | INTRAVENOUS | Status: DC | PRN

## 2021-12-04 MED ORDER — VERAPAMIL HCL 2.5 MG/ML IV SOLN
INTRAVENOUS | Status: AC
  Filled 2021-12-04: qty 2

## 2021-12-04 MED ORDER — OXYCODONE-ACETAMINOPHEN 5-325 MG PO TABS
2.0000 | ORAL_TABLET | Freq: Once | ORAL | Status: AC
  Administered 2021-12-04: 2 via ORAL
  Filled 2021-12-04 (×2): qty 2

## 2021-12-04 MED ORDER — FENTANYL CITRATE (PF) 100 MCG/2ML IJ SOLN
INTRAMUSCULAR | Status: AC
  Filled 2021-12-04: qty 2

## 2021-12-04 MED ORDER — ASPIRIN 81 MG PO CHEW
CHEWABLE_TABLET | ORAL | Status: AC
  Administered 2021-12-04: 81 mg via ORAL
  Filled 2021-12-04: qty 1

## 2021-12-04 MED ORDER — METHYLPREDNISOLONE SODIUM SUCC 125 MG IJ SOLR
INTRAMUSCULAR | Status: AC
  Administered 2021-12-04: 125 mg via INTRAVENOUS
  Filled 2021-12-04: qty 2

## 2021-12-04 MED ORDER — SODIUM CHLORIDE 0.9 % IV SOLN: INTRAVENOUS | Status: AC

## 2021-12-04 MED ORDER — MIDAZOLAM HCL 2 MG/2ML IJ SOLN
INTRAMUSCULAR | Status: AC
  Filled 2021-12-04: qty 2

## 2021-12-04 MED ORDER — SODIUM CHLORIDE 0.9% FLUSH: 3.0000 mL | Freq: Two times a day (BID) | INTRAVENOUS | Status: DC

## 2021-12-04 MED ORDER — LIDOCAINE HCL (PF) 1 % IJ SOLN
INTRAMUSCULAR | Status: AC
  Filled 2021-12-04: qty 30

## 2021-12-04 MED ORDER — DIPHENHYDRAMINE HCL 50 MG/ML IJ SOLN: 25.0000 mg | Freq: Once | INTRAMUSCULAR | Status: AC

## 2021-12-04 MED ORDER — ASPIRIN 81 MG PO CHEW: 81.0000 mg | CHEWABLE_TABLET | ORAL | Status: AC

## 2021-12-04 MED ORDER — HYDRALAZINE HCL 20 MG/ML IJ SOLN: 10.0000 mg | INTRAMUSCULAR | Status: DC | PRN

## 2021-12-04 MED ORDER — METHYLPREDNISOLONE SODIUM SUCC 125 MG IJ SOLR: 125.0000 mg | Freq: Once | INTRAMUSCULAR | Status: AC

## 2021-12-04 MED ORDER — HEPARIN SODIUM (PORCINE) 1000 UNIT/ML IJ SOLN
INTRAMUSCULAR | Status: AC
  Filled 2021-12-04: qty 10

## 2021-12-04 MED ORDER — HEPARIN SODIUM (PORCINE) 1000 UNIT/ML IJ SOLN
INTRAMUSCULAR | Status: DC | PRN
  Administered 2021-12-04: 6000 [IU] via INTRAVENOUS

## 2021-12-04 MED ORDER — IOHEXOL 350 MG/ML SOLN
INTRAVENOUS | Status: DC | PRN
  Administered 2021-12-04: 40 mL

## 2021-12-04 MED ORDER — FENTANYL CITRATE (PF) 100 MCG/2ML IJ SOLN
INTRAMUSCULAR | Status: DC | PRN
  Administered 2021-12-04 (×2): 25 ug via INTRAVENOUS

## 2021-12-04 MED ORDER — DIPHENHYDRAMINE HCL 50 MG/ML IJ SOLN
INTRAMUSCULAR | Status: AC
  Administered 2021-12-04: 25 mg via INTRAVENOUS
  Filled 2021-12-04: qty 1

## 2021-12-04 SURGICAL SUPPLY — 10 items
CATH BALLN WEDGE 5F 110CM (CATHETERS) ×1 IMPLANT
CATH INFINITI 5 FR JL3.5 (CATHETERS) ×1 IMPLANT
CATH INFINITI 5FR MULTPACK ANG (CATHETERS) ×1 IMPLANT
DEVICE RAD COMP TR BAND LRG (VASCULAR PRODUCTS) ×1 IMPLANT
GLIDESHEATH SLEND SS 6F .021 (SHEATH) ×1 IMPLANT
GUIDEWIRE INQWIRE 1.5J.035X260 (WIRE) IMPLANT
INQWIRE 1.5J .035X260CM (WIRE) ×2
PACK CARDIAC CATHETERIZATION (CUSTOM PROCEDURE TRAY) ×2 IMPLANT
SHEATH GLIDE SLENDER 4/5FR (SHEATH) ×1 IMPLANT
TRANSDUCER W/STOPCOCK (MISCELLANEOUS) ×2 IMPLANT

## 2021-12-04 NOTE — Progress Notes (Addendum)
On arrival from cath lab, client c/o 9/10 back and groin pain and c/o feeling like heart palpitations in epigastric area; O2 sats 88 on 2l/min. Dr Haroldine Laws notified client complaints and O2 sat and order noted

## 2021-12-04 NOTE — Progress Notes (Signed)
Client states cont pain 7/10 back pain; Dr Haroldine Laws notified of cont c/o pain and no further orders; client states wants to know plan, Dr Haroldine Laws notified of this and client notified she will she him back at office

## 2021-12-04 NOTE — Interval H&P Note (Signed)
History and Physical Interval Note:  12/04/2021 8:28 AM  Teresa Wiley  has presented today for surgery, with the diagnosis of hp.  The various methods of treatment have been discussed with the patient and family. After consideration of risks, benefits and other options for treatment, the patient has consented to  Procedure(s): RIGHT/LEFT HEART CATH AND CORONARY ANGIOGRAPHY (N/A) and possible coronary angioplasty as a surgical intervention.  The patient's history has been reviewed, patient examined, no change in status, stable for surgery.  I have reviewed the patient's chart and labs.  Questions were answered to the patient's satisfaction.     Catheryn Slifer

## 2021-12-06 LAB — POCT I-STAT 7, (LYTES, BLD GAS, ICA,H+H)
Acid-Base Excess: 3 mmol/L — ABNORMAL HIGH (ref 0.0–2.0)
Acid-Base Excess: 5 mmol/L — ABNORMAL HIGH (ref 0.0–2.0)
Bicarbonate: 29.8 mmol/L — ABNORMAL HIGH (ref 20.0–28.0)
Bicarbonate: 31.7 mmol/L — ABNORMAL HIGH (ref 20.0–28.0)
Calcium, Ion: 1.13 mmol/L — ABNORMAL LOW (ref 1.15–1.40)
Calcium, Ion: 1.2 mmol/L (ref 1.15–1.40)
HCT: 34 % — ABNORMAL LOW (ref 36.0–46.0)
HCT: 36 % (ref 36.0–46.0)
Hemoglobin: 11.6 g/dL — ABNORMAL LOW (ref 12.0–15.0)
Hemoglobin: 12.2 g/dL (ref 12.0–15.0)
O2 Saturation: 75 %
O2 Saturation: 91 %
Potassium: 3.4 mmol/L — ABNORMAL LOW (ref 3.5–5.1)
Potassium: 3.6 mmol/L (ref 3.5–5.1)
Sodium: 142 mmol/L (ref 135–145)
Sodium: 144 mmol/L (ref 135–145)
TCO2: 31 mmol/L (ref 22–32)
TCO2: 33 mmol/L — ABNORMAL HIGH (ref 22–32)
pCO2 arterial: 53.1 mmHg — ABNORMAL HIGH (ref 32.0–48.0)
pCO2 arterial: 57.3 mmHg — ABNORMAL HIGH (ref 32.0–48.0)
pH, Arterial: 7.351 (ref 7.350–7.450)
pH, Arterial: 7.357 (ref 7.350–7.450)
pO2, Arterial: 43 mmHg — ABNORMAL LOW (ref 83.0–108.0)
pO2, Arterial: 65 mmHg — ABNORMAL LOW (ref 83.0–108.0)

## 2021-12-06 LAB — POCT I-STAT EG7
Acid-Base Excess: 2 mmol/L (ref 0.0–2.0)
Bicarbonate: 28.9 mmol/L — ABNORMAL HIGH (ref 20.0–28.0)
Calcium, Ion: 1.03 mmol/L — ABNORMAL LOW (ref 1.15–1.40)
HCT: 32 % — ABNORMAL LOW (ref 36.0–46.0)
Hemoglobin: 10.9 g/dL — ABNORMAL LOW (ref 12.0–15.0)
O2 Saturation: 75 %
Potassium: 3.1 mmol/L — ABNORMAL LOW (ref 3.5–5.1)
Sodium: 145 mmol/L (ref 135–145)
TCO2: 31 mmol/L (ref 22–32)
pCO2, Ven: 54.5 mmHg (ref 44.0–60.0)
pH, Ven: 7.333 (ref 7.250–7.430)
pO2, Ven: 44 mmHg (ref 32.0–45.0)

## 2021-12-12 ENCOUNTER — Emergency Department (HOSPITAL_COMMUNITY): Payer: Medicaid Other

## 2021-12-12 ENCOUNTER — Other Ambulatory Visit: Payer: Self-pay

## 2021-12-12 ENCOUNTER — Encounter (HOSPITAL_COMMUNITY): Payer: Self-pay | Admitting: *Deleted

## 2021-12-12 ENCOUNTER — Emergency Department (HOSPITAL_COMMUNITY)
Admission: EM | Admit: 2021-12-12 | Discharge: 2021-12-13 | Disposition: A | Discharge status: Home / Self Care | Payer: Medicaid Other | Attending: Emergency Medicine | Admitting: Emergency Medicine

## 2021-12-12 DIAGNOSIS — Z7901 Long term (current) use of anticoagulants: Secondary | ICD-10-CM | POA: Insufficient documentation

## 2021-12-12 DIAGNOSIS — Z20822 Contact with and (suspected) exposure to covid-19: Secondary | ICD-10-CM | POA: Diagnosis not present

## 2021-12-12 DIAGNOSIS — R0789 Other chest pain: Secondary | ICD-10-CM | POA: Diagnosis present

## 2021-12-12 DIAGNOSIS — R000 Tachycardia, unspecified: Secondary | ICD-10-CM | POA: Diagnosis not present

## 2021-12-12 DIAGNOSIS — M549 Dorsalgia, unspecified: Secondary | ICD-10-CM | POA: Insufficient documentation

## 2021-12-12 LAB — CBC
HCT: 39.9 % (ref 36.0–46.0)
Hemoglobin: 11 g/dL — ABNORMAL LOW (ref 12.0–15.0)
MCH: 24 pg — ABNORMAL LOW (ref 26.0–34.0)
MCHC: 27.6 g/dL — ABNORMAL LOW (ref 30.0–36.0)
MCV: 87.1 fL (ref 80.0–100.0)
Platelets: 385 10*3/uL (ref 150–400)
RBC: 4.58 MIL/uL (ref 3.87–5.11)
RDW: 17 % — ABNORMAL HIGH (ref 11.5–15.5)
WBC: 6.5 10*3/uL (ref 4.0–10.5)
nRBC: 0 % (ref 0.0–0.2)

## 2021-12-12 LAB — TROPONIN I (HIGH SENSITIVITY)
Troponin I (High Sensitivity): 3 ng/L (ref <18)
Troponin I (High Sensitivity): 5 ng/L (ref <18)

## 2021-12-12 LAB — BASIC METABOLIC PANEL
Anion gap: 6 (ref 5–15)
BUN: 10 mg/dL (ref 8–23)
CO2: 29 mmol/L (ref 22–32)
Calcium: 9.1 mg/dL (ref 8.9–10.3)
Chloride: 103 mmol/L (ref 98–111)
Creatinine, Ser: 0.88 mg/dL (ref 0.44–1.00)
GFR, Estimated: >60 mL/min (ref >60)
Glucose, Bld: 106 mg/dL — ABNORMAL HIGH (ref 70–99)
Potassium: 4.1 mmol/L (ref 3.5–5.1)
Sodium: 138 mmol/L (ref 135–145)

## 2021-12-12 LAB — RESP PANEL BY RT-PCR (FLU A&B, COVID) ARPGX2
Influenza A by PCR: NEGATIVE
Influenza B by PCR: NEGATIVE
SARS Coronavirus 2 by RT PCR: NEGATIVE

## 2021-12-12 MED ORDER — FENTANYL CITRATE PF 50 MCG/ML IJ SOSY
50.0000 ug | PREFILLED_SYRINGE | Freq: Once | INTRAMUSCULAR | Status: AC
  Administered 2021-12-12: 50 ug via INTRAVENOUS
  Filled 2021-12-12: qty 1

## 2021-12-12 MED ORDER — DIPHENHYDRAMINE HCL 50 MG/ML IJ SOLN
50.0000 mg | Freq: Once | INTRAMUSCULAR | Status: AC
  Administered 2021-12-13: 50 mg via INTRAVENOUS
  Filled 2021-12-12: qty 1

## 2021-12-12 MED ORDER — HYDROCORTISONE SOD SUC (PF) 250 MG IJ SOLR
200.0000 mg | Freq: Once | INTRAMUSCULAR | Status: AC
  Administered 2021-12-12: 200 mg via INTRAVENOUS
  Filled 2021-12-12: qty 200

## 2021-12-12 MED ORDER — DIPHENHYDRAMINE HCL 25 MG PO CAPS
50.0000 mg | ORAL_CAPSULE | Freq: Once | ORAL | Status: AC
  Filled 2021-12-12 (×2): qty 2

## 2021-12-12 NOTE — ED Triage Notes (Signed)
The pt reports that she has had lt arm for 4-5 days  no known injury  she thinks it is her heart   she is on eliquist for a blood clot in her lung

## 2021-12-12 NOTE — ED Notes (Signed)
Wife Rigoberto Noel [phone] would like an update

## 2021-12-12 NOTE — ED Triage Notes (Addendum)
The? pt reports that she has been on the blood thinner since 2015 ??? Pe still there

## 2021-12-12 NOTE — ED Provider Notes (Signed)
[facility] Provider Note   CSN: [id number] Arrival date & time: 12/12/21  1848     History  No chief complaint on file.   Teresa Wiley is a 66 y.o. female.  HPI 66 year old female presents with chest pain and back pain.  It is left side of her chest and left side of her back.  It is a sharp pain.  Worse when she coughs sometimes.  Worse with certain movements like moving her left arm.  She will feel pain radiating down her left arm as well as down her left side of her body and into her leg.  Occasionally her arm and leg will get numb.  Pain is severe.  She is been taking Percocet and Tylenol with no relief.  She just had a heart cath about 8 days ago.  No fevers or new cough.  She chronically wears 2.5 L oxygen.  Home Medications Prior to Admission medications   Medication Sig Start Date End Date Taking? Authorizing Provider  albuterol (PROVENTIL HFA;VENTOLIN HFA) 108 (90 BASE) MCG/ACT inhaler Inhale 2 puffs into the lungs every 6 (six) hours as needed for wheezing or shortness of breath. For shortness of breath   Yes [provider]  ALPRAZolam (XANAX) 1 MG tablet Take 1 mg by mouth 3 (three) times daily as needed for anxiety. 06/06/20  Yes [provider]  apixaban (ELIQUIS) 5 MG TABS tablet Take 5 mg by mouth 2 (two) times daily.   Yes [provider]  dextromethorphan-guaiFENesin (MUCINEX DM) 30-600 MG 12hr tablet Take 1 tablet by mouth 2 (two) times daily as needed for cough.   Yes [provider]  diphenhydrAMINE (BENADRYL) 25 MG tablet Take 25 mg by mouth every 6 (six) hours as needed for allergies.   Yes [provider]  diphenhydramine-acetaminophen (TYLENOL PM) 25-500 MG TABS tablet Take 1 tablet by mouth at bedtime as needed (sleep).   Yes [provider]  esomeprazole (NEXIUM) 20 MG capsule Take 20 mg by mouth every evening. 06/01/20  Yes [provider]  furosemide (LASIX)  40 MG tablet Take 40 mg by mouth daily.   Yes [provider]  gabapentin (NEURONTIN) 600 MG tablet Take 600 mg by mouth 3 (three) times daily. 06/20/20  Yes [provider]  lisinopril (ZESTRIL) 2.5 MG tablet Take 2.5 mg by mouth daily.   Yes [provider]  oxyCODONE-acetaminophen (PERCOCET) 10-325 MG tablet Take 1 tablet by mouth 4 (four) times daily. 06/06/20  Yes [provider]  OXYGEN Inhale 2.5 L into the lungs continuous.   Yes [provider]  Pseudoeph-Doxylamine-DM-APAP (NYQUIL PO) Take 2 capsules by mouth at bedtime as needed (cough).   Yes [provider]  Semaglutide, 1 MG/DOSE, (OZEMPIC, 1 MG/DOSE,) 4 MG/3ML SOPN Inject 1 mg into the skin every Thursday.   Yes [provider]  sildenafil (REVATIO) 20 MG tablet Take 1 tablet (20 mg total) by mouth 3 (three) times daily. 06/14/21  Yes Florencia Reasons, MD  Vitamin D, Ergocalciferol, (DRISDOL) 50000 units CAPS capsule Take 50,000 Units by mouth every Wednesday. 01/14/18  Yes [provider]  chlorpheniramine-HYDROcodone (TUSSIONEX) 10-8 MG/5ML SUER Take 5 mLs by mouth daily as needed for cough. Patient not taking: Reported on 12/12/2021    [provider]  fluticasone (FLONASE) 50 MCG/ACT nasal spray Place 1 spray into both nostrils daily. Patient not taking: Reported on 12/03/2021 06/06/21   Martyn Ehrich, NP  loratadine (CLARITIN) 10 MG  tablet Take 1 tablet (10 mg total) by mouth daily. Patient not taking: Reported on 12/03/2021 06/06/21   Martyn Ehrich, NP      Allergies    Buprenorphine hcl, Erythromycin, Iodine, Morphine and related, Shellfish allergy, Shellfish-derived products, and Other    Review of Systems   Review of Systems  Constitutional:  Negative for fever.  Respiratory:  Negative for shortness of breath.   Cardiovascular:  Positive for chest pain.  Gastrointestinal:  Negative for abdominal pain.  Musculoskeletal:  Positive for back pain.   Skin:  Negative for rash.   Physical Exam Updated Vital Signs BP 134/78    Pulse 94    Temp 99.4 F (37.4 C)    Resp 20    Ht 5\' 10"  (1.778 m)    Wt 119.7 kg    SpO2 94%    BMI 37.86 kg/m  Physical Exam Vitals and nursing note reviewed.  Constitutional:      General: She is in acute distress (appears in severe pain).     Appearance: She is well-developed. She is obese.  HENT:     Head: Normocephalic and atraumatic.  Cardiovascular:     Rate and Rhythm: Regular rhythm. Tachycardia present.     Pulses:          Radial pulses are 2+ on the left side.       Dorsalis pedis pulses are 2+ on the right side.     Heart sounds: Normal heart sounds.  Pulmonary:     Effort: Pulmonary effort is normal.     Breath sounds: Normal breath sounds.  Abdominal:     General: There is no distension.     Palpations: Abdomen is soft.     Tenderness: There is no abdominal tenderness.  Musculoskeletal:     Comments: Left anterior chest and left thoracic back near her scapula is all diffusely tender.  No swelling or rash.  Pain with range of motion of the left shoulder.  Skin:    General: Skin is warm and dry.     Findings: No rash.  Neurological:     Mental Status: She is alert.    ED Results / Procedures / Treatments   Labs (all labs ordered are listed, but only abnormal results are displayed) Labs Reviewed  BASIC METABOLIC PANEL - Abnormal; Notable for the following components:      Result Value   Glucose, Bld 106 (*)    All other components within normal limits  CBC - Abnormal; Notable for the following components:   Hemoglobin 11.0 (*)    MCH 24.0 (*)    MCHC 27.6 (*)    RDW 17.0 (*)    All other components within normal limits  RESP PANEL BY RT-PCR (FLU A&B, COVID) [id number]  TROPONIN I (HIGH SENSITIVITY)  TROPONIN I (HIGH SENSITIVITY)    EKG EKG Interpretation  Date/Time:  Wednesday December 12 2021 19:58:49 EST Ventricular Rate:  100 PR Interval:  181 QRS Duration: 91 QT  Interval:  333 QTC Calculation: 430 R Axis:   80 Text Interpretation: Sinus tachycardia no acute ST/T changes similar to earlier in day and July 2022 Confirmed by Sherwood Gambler [phone]) on 12/12/2021 10:17:45 PM  Radiology DG Chest Portable 1 View  Result Date: 12/12/2021 CLINICAL DATA:  Shortness of breath and left shoulder pain. EXAM: PORTABLE CHEST 1 VIEW COMPARISON:  Chest x-ray 06/12/2021.  Chest CT 06/12/2021. FINDINGS: The patient is slightly rotated. There is apparent widening of the mediastinal  silhouette. The heart is mildly enlarged, unchanged. There are patchy airspace opacities in both lung bases with small pleural effusions. No acute fractures are identified. There is no pneumothorax. IMPRESSION: 1. Apparent widening of the mediastinal silhouette may be related to patient rotation. Recommend follow-up PA and lateral chest x-ray for further evaluation or chest CT if there is high clinical concern for acute mediastinal pathology. 2. Bibasilar atelectasis/airspace disease with small pleural effusions. Electronically Signed   By: Ronney Asters M.D.   On: 12/12/2021 19:36    Procedures Ultrasound ED Peripheral IV (Provider)  Date/Time: 12/12/2021 9:14 PM Performed by: Sherwood Gambler, MD Authorized by: Sherwood Gambler, MD   Procedure details:    Indications: poor IV access     Skin Prep: chlorhexidine gluconate     Location:  Left AC   Angiocath:  18 G   Bedside Ultrasound Guided: Yes     Patient tolerated procedure without complications: Yes     Dressing applied: Yes      Medications Ordered in ED Medications  diphenhydrAMINE (BENADRYL) capsule 50 mg (has no administration in time range)    Or  diphenhydrAMINE (BENADRYL) injection 50 mg (has no administration in time range)  fentaNYL (SUBLIMAZE) injection 50 mcg (50 mcg Intravenous Given 12/12/21 2127)  hydrocortisone sodium succinate (SOLU-CORTEF) injection 200 mg (200 mg Intravenous Given 12/12/21 2303)  fentaNYL  (SUBLIMAZE) injection 50 mcg (50 mcg Intravenous Given 12/12/21 2314)    ED Course/ Medical Decision Making/ A&P                           Medical Decision Making Amount and/or Complexity of Data Reviewed Radiology: ordered.  Risk Prescription drug management.   Patient was in severe pain when she first arrived.  Given this along with symptoms of pain in her extremities, I am concerned that she might have a dissection and think she needs a CT angiography.  I have personally reviewed and interpreted her chest x-ray which does show an possible mediastinal widening but is also not the greatest film quality.  Her labs have been reviewed and interpreted and are benign including 2 negative troponins and stable anemia.  ECG has been reviewed and interpreted and is not showing any ischemia.  I think ACS is unlikely, especially with her recent cath just a days ago, which has been reviewed and showed no significant CAD.    Unfortunately, there is questionable IV dye allergy.  It is listed in her chart, but then she also just had the cardiac cath 1 week ago.  However at that time they also gave her Benadryl and Solu-Medrol an hour or so before the procedure.  She has received a VQ scan in the past according to chart review which makes me think someone thought she had a allergy in the past.  The patient is a poor historian in this regard and so to be safe we will do pretreatment after discussion with CT.  Care transferred to Dr. Ralene Bathe.        Final Clinical Impression(s) / ED Diagnoses Final diagnoses:  None    Rx / DC Orders ED Discharge Orders     None         Sherwood Gambler, MD 12/12/21 2344

## 2021-12-12 NOTE — ED Provider Notes (Signed)
Patient care assumed at 2330.  Patient with recent heart cath here for evaluation of left-sided chest and back pain.  She is on chronic oxygen therapy.  She is also on chronic pain medications.  Chest x-ray with possible widened mediastinum and care assumed pending a CTA.  CTA is negative for acute abnormality.  Troponins are negative x2.  CBC with stable anemia.  BMP without significant electrolyte abnormality.  Presentation is not consistent with dissection.  No evidence of pneumonia.  Doubt ACS.  On bedside evaluation patient has reproducible pain with palpation over the left scapular area as well as the left anterior chest wall.  There is no overlying rash or evidence of zoster.  Suspect that this is chest wall pain.  She is already on multiple medications for pain control at home, which may make her pain control more challenging.  Will provide Lidoderm patch.  Recommend close PCP as well as pain management follow-up for further treatment of her symptoms.  Recommend return if she develops new or concerning symptoms.   Quintella Reichert, MD 12/13/21 [phone]

## 2021-12-13 ENCOUNTER — Emergency Department (HOSPITAL_COMMUNITY): Payer: Medicaid Other

## 2021-12-13 MED ORDER — LIDOCAINE 5 % EX PTCH: 1.0000 | MEDICATED_PATCH | CUTANEOUS | 0 refills | Status: DC

## 2021-12-13 MED ORDER — OXYCODONE HCL 5 MG PO TABS
10.0000 mg | ORAL_TABLET | Freq: Once | ORAL | Status: AC
Start: 2021-12-13 — End: 2021-12-13
  Administered 2021-12-13: 10 mg via ORAL
  Filled 2021-12-13: qty 2

## 2021-12-13 MED ORDER — LIDOCAINE 5 % EX PTCH
1.0000 | MEDICATED_PATCH | CUTANEOUS | Status: DC
  Administered 2021-12-13: 1 via TRANSDERMAL
  Filled 2021-12-13: qty 1

## 2021-12-13 MED ORDER — GABAPENTIN 300 MG PO CAPS
600.0000 mg | ORAL_CAPSULE | Freq: Once | ORAL | Status: AC
  Administered 2021-12-13: 600 mg via ORAL
  Filled 2021-12-13: qty 2

## 2021-12-13 MED ORDER — FENTANYL CITRATE PF 50 MCG/ML IJ SOSY
50.0000 ug | PREFILLED_SYRINGE | Freq: Once | INTRAMUSCULAR | Status: AC
  Administered 2021-12-13: 50 ug via INTRAVENOUS
  Filled 2021-12-13: qty 1

## 2021-12-13 MED ORDER — IOHEXOL 350 MG/ML SOLN
100.0000 mL | Freq: Once | INTRAVENOUS | Status: AC | PRN
  Administered 2021-12-13: 100 mL via INTRAVENOUS

## 2021-12-13 NOTE — ED Notes (Signed)
Patient educated about not driving or performing other critical tasks (such as operating heavy machinery, caring for infant/toddler/child) due to sedative nature of narcotic medications received while in the ED.  Pt/caregiver verbalized understanding.   

## 2021-12-13 NOTE — ED Notes (Signed)
Patient transported to CT 

## 2021-12-13 NOTE — ED Notes (Signed)
Pt ambulated to and from the restroom independently.

## 2022-01-31 ENCOUNTER — Encounter: Payer: Self-pay | Admitting: Internal Medicine

## 2022-01-31 ENCOUNTER — Ambulatory Visit (INDEPENDENT_AMBULATORY_CARE_PROVIDER_SITE_OTHER): Payer: Medicaid Other | Admitting: Internal Medicine

## 2022-01-31 ENCOUNTER — Other Ambulatory Visit: Payer: Self-pay

## 2022-01-31 VITALS — BP 144/80 | Wt 263.0 lb

## 2022-01-31 DIAGNOSIS — Z87891 Personal history of nicotine dependence: Secondary | ICD-10-CM

## 2022-01-31 DIAGNOSIS — J9611 Chronic respiratory failure with hypoxia: Secondary | ICD-10-CM

## 2022-01-31 DIAGNOSIS — J9809 Other diseases of bronchus, not elsewhere classified: Secondary | ICD-10-CM

## 2022-01-31 DIAGNOSIS — I2729 Other secondary pulmonary hypertension: Secondary | ICD-10-CM

## 2022-01-31 LAB — PULMONARY FUNCTION TEST
DL/VA % pred: 89 %
DL/VA: 3.57 ml/min/mmHg/L
DLCO cor % pred: 58 %
DLCO cor: 14.24 ml/min/mmHg
DLCO unc % pred: 58 %
DLCO unc: 14.24 ml/min/mmHg
FEF 25-75 Post: 1.01 L/sec
FEF 25-75 Pre: 1.41 L/sec
FEF2575-%Change-Post: -28 %
FEF2575-%Pred-Post: 42 %
FEF2575-%Pred-Pre: 59 %
FEV1-%Change-Post: -7 %
FEV1-%Pred-Post: 61 %
FEV1-%Pred-Pre: 66 %
FEV1-Post: 1.57 L
FEV1-Pre: 1.69 L
FEV1FVC-%Change-Post: 0 %
FEV1FVC-%Pred-Pre: 98 %
FEV6-%Change-Post: -6 %
FEV6-%Pred-Post: 64 %
FEV6-%Pred-Pre: 68 %
FEV6-Post: 2.03 L
FEV6-Pre: 2.18 L
FEV6FVC-%Pred-Post: 103 %
FEV6FVC-%Pred-Pre: 103 %
FVC-%Change-Post: -6 %
FVC-%Pred-Post: 62 %
FVC-%Pred-Pre: 66 %
FVC-Post: 2.03 L
FVC-Pre: 2.18 L
Post FEV1/FVC ratio: 77 %
Post FEV6/FVC ratio: 100 %
Pre FEV1/FVC ratio: 78 %
Pre FEV6/FVC Ratio: 100 %
RV % pred: 79 %
RV: 1.87 L
TLC % pred: 71 %
TLC: 4.28 L

## 2022-01-31 MED ORDER — ESOMEPRAZOLE MAGNESIUM 40 MG PO CPDR: 40.0000 mg | DELAYED_RELEASE_CAPSULE | Freq: Every evening | ORAL | 5 refills | Status: DC
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
## 2022-01-31 NOTE — Progress Notes (Signed)
Full PFT performed today. °

## 2022-01-31 NOTE — Patient Instructions (Addendum)
Please schedule follow up scheduled with myself in 3 months.  If my schedule is not open yet, we will contact you with a reminder closer to that time. Please call [phone] if you haven't heard from [place] a month before.  ? ?I will talk with your heart doctor about next steps for your breathing.  ? ?Since you have never had a blood clot we discussed stopping your Eliquis today. This is also because you are having complications with bleeding. Hopefully this helps. ? ?Continue oxygen therapy. We are prescribing inogen today - hopefully you will hear from them soon.  ? ?Continue revatio three times a day. ? ?increase your nexium to 40 mg daily. Try sleeping with wedge pillow. This will help reflux.  ? ? ? ?

## 2022-01-31 NOTE — Patient Instructions (Signed)
Full PFT performed today. °

## 2022-02-01 NOTE — Addendum Note (Signed)
Addended by: Lenice Llamas on: 02/01/2022 03:07 PM ? ? Modules accepted: Orders ? ?

## 2022-02-04 ENCOUNTER — Telehealth: Payer: Self-pay | Admitting: Internal Medicine

## 2022-02-04 DIAGNOSIS — J9611 Chronic respiratory failure with hypoxia: Secondary | ICD-10-CM

## 2022-02-04 NOTE — Telephone Encounter (Signed)
Since patient has [employer] as DME I have placed an order for a best fit eval. Nothing further needed at this time.  ?

## 2022-02-04 NOTE — Telephone Encounter (Signed)
O2 order was placed on 3/9 and the order stated to send to [employer].  I faxed as "urgent" and sent to [employer].  I received a fax back today that pt has Medicaid and will need to be sent to someone else.  I will need a new order due to order stating [employer] on it.  Pt hasn't been walked since January according to [facility] note so I don't know if she was planning to pay out-of-pocket or if SATS just weren't entered. Sending to triage for new order.  ?

## 2022-02-12 ENCOUNTER — Other Ambulatory Visit (HOSPITAL_COMMUNITY): Payer: Medicaid Other

## 2022-02-12 ENCOUNTER — Ambulatory Visit (HOSPITAL_COMMUNITY): Admission: RE | Admit: 2022-02-12 | Payer: Medicaid Other | Source: Ambulatory Visit

## 2022-02-12 ENCOUNTER — Telehealth: Payer: Self-pay | Admitting: Internal Medicine

## 2022-02-12 NOTE — Telephone Encounter (Signed)
Lm x1 for patient.  

## 2022-02-12 NOTE — Telephone Encounter (Signed)
Letter completed and logged into patient mychart. ?

## 2022-02-12 NOTE — Telephone Encounter (Signed)
Spoke to patient. ?She stated that her electricity is scheduled to be discontinued tomorrow. Patient wears 2.5L cont.  ?She is requesting a letter to be written stating her need for supplemental oxygen.  ? ?Dr. Loanne Drilling, please advise. Dr. Shearon Stalls is unavailable.  ?

## 2022-02-12 NOTE — Telephone Encounter (Signed)
Patient is aware of below message and voiced her understanding.  ?She will obtain letter via mychart. ?Nothing further needed.  ? ?

## 2022-02-13 ENCOUNTER — Telehealth: Payer: Self-pay | Admitting: Internal Medicine

## 2022-02-13 NOTE — Telephone Encounter (Signed)
Called patient but she did not answer. Left message for her to call back.  

## 2022-02-19 ENCOUNTER — Ambulatory Visit (HOSPITAL_COMMUNITY): Payer: Medicaid Other | Attending: Internal Medicine

## 2022-03-06 NOTE — Telephone Encounter (Signed)
Called and spoke with patient. She stated that she has received all of the letter necessary for her electricity bill. However, she did state that she has not heard anything from [employer] in regards to her POC. I verified that the order had been sent to [employer] on 02/04/22 and advised her that I would call [employer] to check on the status. She verbalized understanding.  ? ?Called [employer] but was on a long hold. Will call back later today.  ?

## 2022-03-07 ENCOUNTER — Ambulatory Visit (HOSPITAL_COMMUNITY)
Admission: RE | Admit: 2022-03-07 | Discharge: 2022-03-07 | Disposition: A | Discharge status: Home / Self Care | Payer: Medicaid Other | Source: Ambulatory Visit | Attending: Internal Medicine | Admitting: Internal Medicine

## 2022-03-07 ENCOUNTER — Other Ambulatory Visit: Payer: Self-pay | Admitting: Internal Medicine

## 2022-03-07 DIAGNOSIS — I10 Essential (primary) hypertension: Secondary | ICD-10-CM | POA: Insufficient documentation

## 2022-03-07 DIAGNOSIS — E119 Type 2 diabetes mellitus without complications: Secondary | ICD-10-CM | POA: Insufficient documentation

## 2022-03-07 DIAGNOSIS — G4730 Sleep apnea, unspecified: Secondary | ICD-10-CM | POA: Diagnosis not present

## 2022-03-07 DIAGNOSIS — J9611 Chronic respiratory failure with hypoxia: Secondary | ICD-10-CM

## 2022-03-07 LAB — ECHOCARDIOGRAM COMPLETE BUBBLE STUDY
AR max vel: 2.43 cm2
AV Peak grad: 10.8 mmHg
Ao pk vel: 1.64 m/s
Area-P 1/2: 4.41 cm2
Calc EF: 59.5 %
S' Lateral: 2.7 cm
Single Plane A2C EF: 56.6 %
Single Plane A4C EF: 58.4 %

## 2022-03-20 ENCOUNTER — Telehealth (HOSPITAL_COMMUNITY): Payer: Self-pay

## 2022-03-20 NOTE — Telephone Encounter (Addendum)
Received a fax requesting medical records from [place]: Almyra Free, MD Records were successfully faxed to: [phone] and [phone] ,which was the numbers provided.. Medical request form will be scanned into patients chart.  ? ?

## 2022-04-05 ENCOUNTER — Telehealth (HOSPITAL_COMMUNITY): Payer: Self-pay | Admitting: *Deleted

## 2022-04-05 NOTE — Telephone Encounter (Signed)
-----   Message from Jolaine Artist, MD sent at 03/12/2022 11:30 AM EDT ----- ?The bubbles occur around beat 5 so may be anomalous vessel as well. RHC didn't have intracardiac stepup.  ? ?We can start with TEE but may need chest CT.  ? ?Nira Conn - can you arrange for TEE please?  ? ?Thanks -dan ? ?----- Message ----- ?From: Spero Geralds, MD ?Sent: 03/12/2022   9:17 AM EDT ?To: Jolaine Artist, MD ? ?Positive bubble study. Is this someone you would consider closing due to her degree of symptoms and hypoxemia? ? ?

## 2022-04-05 NOTE — Telephone Encounter (Signed)
Left message to call back  

## 2022-04-10 ENCOUNTER — Other Ambulatory Visit: Payer: Self-pay | Admitting: Physician Assistant

## 2022-04-10 DIAGNOSIS — Z1231 Encounter for screening mammogram for malignant neoplasm of breast: Secondary | ICD-10-CM

## 2022-05-06 ENCOUNTER — Ambulatory Visit
Admission: RE | Admit: 2022-05-06 | Discharge: 2022-05-06 | Disposition: A | Discharge status: Home / Self Care | Payer: Medicaid Other | Source: Ambulatory Visit | Attending: Physician Assistant | Admitting: Physician Assistant

## 2022-05-06 DIAGNOSIS — Z1231 Encounter for screening mammogram for malignant neoplasm of breast: Secondary | ICD-10-CM

## 2022-05-15 ENCOUNTER — Ambulatory Visit: Payer: Medicaid Other | Admitting: Internal Medicine

## 2022-05-20 ENCOUNTER — Ambulatory Visit: Payer: Medicaid Other

## 2022-05-23 NOTE — Telephone Encounter (Signed)
Called and spoke with pt to see if she ever received a POC and she said she went to [employer] and they walked her around to see if she could qualify for a POC. She stated that her sats would not stay up with being on the POC so she is not able to qualify for one.  Nothing further needed.

## 2022-06-04 ENCOUNTER — Ambulatory Visit: Payer: Medicaid Other | Admitting: Internal Medicine

## 2022-06-04 ENCOUNTER — Ambulatory Visit: Payer: Medicare Other | Attending: Physical Medicine and Rehabilitation

## 2022-06-04 DIAGNOSIS — R279 Unspecified lack of coordination: Secondary | ICD-10-CM | POA: Diagnosis present

## 2022-06-04 DIAGNOSIS — R0609 Other forms of dyspnea: Secondary | ICD-10-CM | POA: Insufficient documentation

## 2022-06-04 DIAGNOSIS — M5459 Other low back pain: Secondary | ICD-10-CM | POA: Insufficient documentation

## 2022-06-04 DIAGNOSIS — R2689 Other abnormalities of gait and mobility: Secondary | ICD-10-CM | POA: Insufficient documentation

## 2022-06-04 DIAGNOSIS — G8929 Other chronic pain: Secondary | ICD-10-CM | POA: Insufficient documentation

## 2022-06-04 DIAGNOSIS — M6281 Muscle weakness (generalized): Secondary | ICD-10-CM | POA: Diagnosis present

## 2022-06-04 DIAGNOSIS — M5442 Lumbago with sciatica, left side: Secondary | ICD-10-CM | POA: Diagnosis present

## 2022-06-04 DIAGNOSIS — R0602 Shortness of breath: Secondary | ICD-10-CM | POA: Diagnosis present

## 2022-06-04 DIAGNOSIS — M5441 Lumbago with sciatica, right side: Secondary | ICD-10-CM | POA: Insufficient documentation
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
## 2022-06-05 ENCOUNTER — Ambulatory Visit (INDEPENDENT_AMBULATORY_CARE_PROVIDER_SITE_OTHER): Payer: Medicare Other | Admitting: Internal Medicine

## 2022-06-05 ENCOUNTER — Encounter: Payer: Self-pay | Admitting: Internal Medicine

## 2022-06-05 ENCOUNTER — Other Ambulatory Visit: Payer: Self-pay | Admitting: Physician Assistant

## 2022-06-05 VITALS — BP 126/74 | HR 82 | Ht 70.0 in | Wt 253.0 lb

## 2022-06-05 DIAGNOSIS — J9611 Chronic respiratory failure with hypoxia: Secondary | ICD-10-CM | POA: Diagnosis not present

## 2022-06-05 DIAGNOSIS — Z1231 Encounter for screening mammogram for malignant neoplasm of breast: Secondary | ICD-10-CM
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
## 2022-06-05 NOTE — Patient Instructions (Signed)
Please schedule follow up scheduled with myself in 3 months.  If my schedule is not open yet, we will contact you with a reminder closer to that time. Please call [phone] if you haven't heard from [place] a month before.   Before your next visit I would like you to have: Follow up with Dr. Clayborne Dana office regarding next steps for your heart.   Continue oxygen therapy at night.

## 2022-06-14 ENCOUNTER — Other Ambulatory Visit (HOSPITAL_COMMUNITY): Payer: Self-pay | Admitting: *Deleted

## 2022-06-14 ENCOUNTER — Ambulatory Visit (HOSPITAL_COMMUNITY)
Admission: RE | Admit: 2022-06-14 | Discharge: 2022-06-14 | Disposition: A | Discharge status: Home / Self Care | Payer: Medicare Other | Source: Ambulatory Visit | Attending: Internal Medicine | Admitting: Internal Medicine

## 2022-06-14 ENCOUNTER — Encounter (HOSPITAL_COMMUNITY): Payer: Self-pay | Admitting: Internal Medicine

## 2022-06-14 VITALS — BP 90/60 | HR 92 | Wt 253.2 lb

## 2022-06-14 DIAGNOSIS — J9611 Chronic respiratory failure with hypoxia: Secondary | ICD-10-CM | POA: Insufficient documentation

## 2022-06-14 DIAGNOSIS — I110 Hypertensive heart disease with heart failure: Secondary | ICD-10-CM | POA: Insufficient documentation

## 2022-06-14 DIAGNOSIS — Z6836 Body mass index (BMI) 36.0-36.9, adult: Secondary | ICD-10-CM | POA: Diagnosis not present

## 2022-06-14 DIAGNOSIS — I272 Pulmonary hypertension, unspecified: Secondary | ICD-10-CM | POA: Insufficient documentation

## 2022-06-14 DIAGNOSIS — Z7985 Long-term (current) use of injectable non-insulin antidiabetic drugs: Secondary | ICD-10-CM | POA: Diagnosis not present

## 2022-06-14 DIAGNOSIS — E119 Type 2 diabetes mellitus without complications: Secondary | ICD-10-CM | POA: Diagnosis not present

## 2022-06-14 DIAGNOSIS — R002 Palpitations: Secondary | ICD-10-CM | POA: Insufficient documentation

## 2022-06-14 DIAGNOSIS — R0789 Other chest pain: Secondary | ICD-10-CM | POA: Insufficient documentation

## 2022-06-14 DIAGNOSIS — I5032 Chronic diastolic (congestive) heart failure: Secondary | ICD-10-CM | POA: Insufficient documentation

## 2022-06-14 DIAGNOSIS — R42 Dizziness and giddiness: Secondary | ICD-10-CM | POA: Diagnosis not present

## 2022-06-14 DIAGNOSIS — G4733 Obstructive sleep apnea (adult) (pediatric): Secondary | ICD-10-CM | POA: Insufficient documentation

## 2022-06-14 DIAGNOSIS — Z86711 Personal history of pulmonary embolism: Secondary | ICD-10-CM | POA: Diagnosis not present

## 2022-06-14 DIAGNOSIS — G894 Chronic pain syndrome: Secondary | ICD-10-CM | POA: Diagnosis not present

## 2022-06-14 DIAGNOSIS — Z87891 Personal history of nicotine dependence: Secondary | ICD-10-CM | POA: Insufficient documentation

## 2022-06-14 DIAGNOSIS — R931 Abnormal findings on diagnostic imaging of heart and coronary circulation: Secondary | ICD-10-CM

## 2022-06-14 DIAGNOSIS — Z9981 Dependence on supplemental oxygen: Secondary | ICD-10-CM | POA: Insufficient documentation

## 2022-06-14 LAB — BASIC METABOLIC PANEL
Anion gap: 9 (ref 5–15)
BUN: 17 mg/dL (ref 8–23)
CO2: 28 mmol/L (ref 22–32)
Calcium: 9.3 mg/dL (ref 8.9–10.3)
Chloride: 102 mmol/L (ref 98–111)
Creatinine, Ser: 1.07 mg/dL — ABNORMAL HIGH (ref 0.44–1.00)
GFR, Estimated: 58 mL/min — ABNORMAL LOW (ref >60)
Glucose, Bld: 123 mg/dL — ABNORMAL HIGH (ref 70–99)
Potassium: 4.1 mmol/L (ref 3.5–5.1)
Sodium: 139 mmol/L (ref 135–145)

## 2022-06-14 LAB — BRAIN NATRIURETIC PEPTIDE: B Natriuretic Peptide: 14.5 pg/mL (ref 0.0–100.0)
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
## 2022-06-14 NOTE — Patient Instructions (Signed)
STOP Lisinopril  Labs done today, your results will be available in MyChart, we will contact you for abnormal readings.  Your physician has requested that you have a TEE. During a TEE, sound waves are used to create images of your heart. It provides your doctor with information about the size and shape of your heart and how well your heart's chambers and valves are working. In this test, a transducer is attached to the end of a flexible tube that's guided down your throat and into your esophagus (the tube leading from you mouth to your stomach) to get a more detailed image of your heart. You are not awake for the procedure. Please see the instruction sheet given to you today. For further information please visit [email or web address]. SEE INSTRUCTIONS BELOW  Your physician recommends that you schedule a follow-up appointment in: 4 months, **PLEASE CALL OUR OFFICE IN SEPTEMBER TO SCHEDULE THIS APPOINTMENT  If you have any questions or concerns before your next appointment please send [place] a message through [place] or call our office at [phone].    TO LEAVE A MESSAGE FOR THE NURSE SELECT OPTION 2, PLEASE LEAVE A MESSAGE INCLUDING: YOUR NAME DATE OF BIRTH CALL BACK NUMBER REASON FOR CALL**this is important as we prioritize the call backs  YOU WILL RECEIVE A CALL BACK THE SAME DAY AS LONG AS YOU CALL BEFORE 4:00 PM   TEE INSTRUCTIONS:  You are scheduled for a TEE on TUESDAY 07/02/22 with Dr. Haroldine Laws.    Please arrive at the [facility] (Main Entrance A) at [facility]: [address] [place], [place] [address] at 7:30 am  DIET: Nothing to eat or drink after midnight except a sip of water with medications (see medication instructions below)  FYI: For your safety, and to allow [place] to monitor your vital signs accurately during the surgery/procedure we request that   if you have artificial nails, gel coating, SNS etc. Please have those removed prior to your surgery/procedure. Not having  the nail coverings /polish removed may result in cancellation or delay of your surgery/procedure.   Medication Instructions: TUE 8/8 AM DO NOT TAKE FUROSEMIDE   You must have a responsible person to drive you home and stay in the waiting area during your procedure. Failure to do so could result in cancellation.  Bring your insurance cards.  *Special Note: Every effort is made to have your procedure done on time. Occasionally there are emergencies that occur at the hospital that may cause delays. Please be patient if a delay does occur.

## 2022-06-23 ENCOUNTER — Observation Stay (HOSPITAL_COMMUNITY)
Admission: EM | Admit: 2022-06-23 | Discharge: 2022-06-25 | Disposition: A | Discharge status: Home Health | Payer: Medicare Other | Attending: Family Medicine | Admitting: Family Medicine

## 2022-06-23 ENCOUNTER — Other Ambulatory Visit: Payer: Self-pay

## 2022-06-23 ENCOUNTER — Emergency Department (HOSPITAL_COMMUNITY): Payer: Medicare Other

## 2022-06-23 ENCOUNTER — Encounter (HOSPITAL_COMMUNITY): Payer: Self-pay | Admitting: Emergency Medicine

## 2022-06-23 DIAGNOSIS — R112 Nausea with vomiting, unspecified: Secondary | ICD-10-CM | POA: Insufficient documentation

## 2022-06-23 DIAGNOSIS — I5030 Unspecified diastolic (congestive) heart failure: Secondary | ICD-10-CM | POA: Insufficient documentation

## 2022-06-23 DIAGNOSIS — Z87891 Personal history of nicotine dependence: Secondary | ICD-10-CM | POA: Diagnosis not present

## 2022-06-23 DIAGNOSIS — G894 Chronic pain syndrome: Secondary | ICD-10-CM | POA: Diagnosis present

## 2022-06-23 DIAGNOSIS — I110 Hypertensive heart disease with heart failure: Secondary | ICD-10-CM | POA: Diagnosis not present

## 2022-06-23 DIAGNOSIS — I10 Essential (primary) hypertension: Secondary | ICD-10-CM | POA: Diagnosis present

## 2022-06-23 DIAGNOSIS — Z9981 Dependence on supplemental oxygen: Secondary | ICD-10-CM | POA: Insufficient documentation

## 2022-06-23 DIAGNOSIS — I272 Pulmonary hypertension, unspecified: Secondary | ICD-10-CM | POA: Diagnosis not present

## 2022-06-23 DIAGNOSIS — J9611 Chronic respiratory failure with hypoxia: Secondary | ICD-10-CM | POA: Diagnosis present

## 2022-06-23 DIAGNOSIS — R55 Syncope and collapse: Principal | ICD-10-CM | POA: Diagnosis present

## 2022-06-23 DIAGNOSIS — Z7901 Long term (current) use of anticoagulants: Secondary | ICD-10-CM | POA: Insufficient documentation

## 2022-06-23 DIAGNOSIS — E66812 Obesity, class 2: Secondary | ICD-10-CM

## 2022-06-23 DIAGNOSIS — Z86711 Personal history of pulmonary embolism: Secondary | ICD-10-CM | POA: Insufficient documentation

## 2022-06-23 DIAGNOSIS — Z91041 Radiographic dye allergy status: Secondary | ICD-10-CM | POA: Diagnosis not present

## 2022-06-23 DIAGNOSIS — R1110 Vomiting, unspecified: Secondary | ICD-10-CM

## 2022-06-23 DIAGNOSIS — E119 Type 2 diabetes mellitus without complications: Secondary | ICD-10-CM

## 2022-06-23 DIAGNOSIS — R197 Diarrhea, unspecified: Secondary | ICD-10-CM | POA: Diagnosis not present

## 2022-06-23 DIAGNOSIS — Z79899 Other long term (current) drug therapy: Secondary | ICD-10-CM | POA: Insufficient documentation

## 2022-06-23 DIAGNOSIS — J449 Chronic obstructive pulmonary disease, unspecified: Secondary | ICD-10-CM | POA: Diagnosis not present

## 2022-06-23 DIAGNOSIS — E6609 Other obesity due to excess calories: Secondary | ICD-10-CM

## 2022-06-23 DIAGNOSIS — Q211 Atrial septal defect, unspecified: Secondary | ICD-10-CM

## 2022-06-23 DIAGNOSIS — R931 Abnormal findings on diagnostic imaging of heart and coronary circulation: Secondary | ICD-10-CM

## 2022-06-23 HISTORY — DX: Chronic obstructive pulmonary disease, unspecified: J44.9

## 2022-06-23 LAB — COMPREHENSIVE METABOLIC PANEL
ALT: 10 U/L (ref 0–44)
AST: 14 U/L — ABNORMAL LOW (ref 15–41)
Albumin: 3.4 g/dL — ABNORMAL LOW (ref 3.5–5.0)
Alkaline Phosphatase: 92 U/L (ref 38–126)
Anion gap: 8 (ref 5–15)
BUN: 10 mg/dL (ref 8–23)
CO2: 31 mmol/L (ref 22–32)
Calcium: 9.2 mg/dL (ref 8.9–10.3)
Chloride: 99 mmol/L (ref 98–111)
Creatinine, Ser: 1.01 mg/dL — ABNORMAL HIGH (ref 0.44–1.00)
GFR, Estimated: >60 mL/min (ref >60)
Glucose, Bld: 126 mg/dL — ABNORMAL HIGH (ref 70–99)
Potassium: 4.2 mmol/L (ref 3.5–5.1)
Sodium: 138 mmol/L (ref 135–145)
Total Bilirubin: 0.4 mg/dL (ref 0.3–1.2)
Total Protein: 7.3 g/dL (ref 6.5–8.1)

## 2022-06-23 LAB — CBC WITH DIFFERENTIAL/PLATELET
Abs Immature Granulocytes: 0 10*3/uL (ref 0.00–0.07)
Basophils Absolute: 0 10*3/uL (ref 0.0–0.1)
Basophils Relative: 1 %
Eosinophils Absolute: 0.2 10*3/uL (ref 0.0–0.5)
Eosinophils Relative: 4 %
HCT: 38.7 % (ref 36.0–46.0)
Hemoglobin: 10.9 g/dL — ABNORMAL LOW (ref 12.0–15.0)
Immature Granulocytes: 0 %
Lymphocytes Relative: 40 %
Lymphs Abs: 1.8 10*3/uL (ref 0.7–4.0)
MCH: 24.1 pg — ABNORMAL LOW (ref 26.0–34.0)
MCHC: 28.2 g/dL — ABNORMAL LOW (ref 30.0–36.0)
MCV: 85.4 fL (ref 80.0–100.0)
Monocytes Absolute: 0.6 10*3/uL (ref 0.1–1.0)
Monocytes Relative: 14 %
Neutro Abs: 1.8 10*3/uL (ref 1.7–7.7)
Neutrophils Relative %: 41 %
Platelets: 379 10*3/uL (ref 150–400)
RBC: 4.53 MIL/uL (ref 3.87–5.11)
RDW: 16.7 % — ABNORMAL HIGH (ref 11.5–15.5)
WBC: 4.4 10*3/uL (ref 4.0–10.5)
nRBC: 0 % (ref 0.0–0.2)

## 2022-06-23 LAB — D-DIMER, QUANTITATIVE: D-Dimer, Quant: <0.27 ug/mL-FEU (ref 0.00–0.50)

## 2022-06-23 LAB — CBG MONITORING, ED: Glucose-Capillary: 128 mg/dL — ABNORMAL HIGH (ref 70–99)

## 2022-06-23 LAB — LIPASE, BLOOD: Lipase: 39 U/L (ref 11–51)

## 2022-06-23 LAB — TROPONIN I (HIGH SENSITIVITY): Troponin I (High Sensitivity): 3 ng/L (ref <18)

## 2022-06-23 LAB — BRAIN NATRIURETIC PEPTIDE: B Natriuretic Peptide: 34.6 pg/mL (ref 0.0–100.0)

## 2022-06-23 MED ORDER — DIPHENHYDRAMINE HCL 50 MG/ML IJ SOLN
50.0000 mg | Freq: Once | INTRAMUSCULAR | Status: AC
  Administered 2022-06-24: 50 mg via INTRAVENOUS
  Filled 2022-06-23: qty 1

## 2022-06-23 MED ORDER — SODIUM CHLORIDE 0.9 % IV BOLUS
1000.0000 mL | Freq: Once | INTRAVENOUS | Status: AC
  Administered 2022-06-23: 1000 mL via INTRAVENOUS

## 2022-06-23 MED ORDER — METHYLPREDNISOLONE SODIUM SUCC 40 MG IJ SOLR
40.0000 mg | Freq: Once | INTRAMUSCULAR | Status: AC
  Administered 2022-06-23: 40 mg via INTRAVENOUS
  Filled 2022-06-23: qty 1

## 2022-06-23 MED ORDER — HYDROMORPHONE HCL 1 MG/ML IJ SOLN
0.5000 mg | Freq: Once | INTRAMUSCULAR | Status: AC
  Administered 2022-06-24: 0.5 mg via INTRAVENOUS
  Filled 2022-06-23: qty 1

## 2022-06-23 MED ORDER — FENTANYL CITRATE PF 50 MCG/ML IJ SOSY
50.0000 ug | PREFILLED_SYRINGE | Freq: Once | INTRAMUSCULAR | Status: AC
  Administered 2022-06-23: 50 ug via INTRAVENOUS
  Filled 2022-06-23: qty 1

## 2022-06-23 MED ORDER — DIPHENHYDRAMINE HCL 25 MG PO CAPS
50.0000 mg | ORAL_CAPSULE | Freq: Once | ORAL | Status: AC
  Filled 2022-06-23: qty 2

## 2022-06-23 NOTE — ED Triage Notes (Signed)
Pt bib [facility] from home for syncopal episode during bowel movement. No head trauma - pt was found slumped over on toilet. Spo2 77% on 3L home o2. NRB placed and spo2 improved to 97%. Per family, pt is scheduled for MRI soon due to "hole in heart."

## 2022-06-23 NOTE — ED Provider Notes (Signed)
[facility] EMERGENCY DEPARTMENT Provider Note   CSN: [id number] Arrival date & time: 06/23/22  2026     History  Chief Complaint  Patient presents with   Loss of Consciousness    Teresa Wiley is a 66 y.o. female history of chronic hypoxic respiratory failure, known intracardiac shunt with positive bubble study, CHF, diabetes, prior vasovagal syncope here for evaluation of syncope.  Patient states she had been feeling poorly today.  She states she felt she needed to have a bowel movement, went to sit on the toilet.  Patient states she was initially constipated than having watery diarrhea started having multiple episodes of emesis.  Patient states Nexium she knew she was being woken up for being found slumped over.  They wears 3 L of oxygen on home O2 for COPD.  Patient states she has pressure-like sensation to the left side of her chest, back all the way through to her abdomen and left lower quadrant and into her leg left. No numbness.  She denies falling, hitting her head.  No headache, numbness or unilateral weakness.  States she feels like her breathing is at her baseline.  She still feels mildly nauseated.  Has some aching to her left jaw however no arm pain.   EMS when they found patient she was 77% on her 3 L of home oxygen subsequently placed on non-rebreather  Patient using home oxygen here in ED on my initial evaluation without any hypoxia   HPI     Home Medications Prior to Admission medications   Medication Sig Start Date End Date Taking? Authorizing Provider  ALPRAZolam Duanne Moron) 1 MG tablet Take 1 mg by mouth 3 (three) times daily as needed for anxiety. 06/06/20   [provider]  carvedilol (COREG) 12.5 MG tablet Take 12.5 mg by mouth 2 (two) times daily. 12/18/21   [provider]  dextromethorphan-guaiFENesin (MUCINEX DM) 30-600 MG 12hr tablet Take 1 tablet by mouth 2 (two) times daily as needed for cough.    [provider]   diphenhydrAMINE (BENADRYL) 25 MG tablet Take 25 mg by mouth every 6 (six) hours as needed for allergies.    [provider]  esomeprazole (NEXIUM) 40 MG capsule Take 1 capsule (40 mg total) by mouth every evening. 01/31/22   Spero Geralds, MD  furosemide (LASIX) 40 MG tablet Take 40 mg by mouth daily.    [provider]  GABAPENTIN PO Take 300 mg by mouth 3 (three) times daily.    [provider]  oxyCODONE-acetaminophen (PERCOCET) 10-325 MG tablet Take 1 tablet by mouth 4 (four) times daily. 06/06/20   [provider]  OXYGEN Inhale 2.5 L into the lungs continuous.    [provider]  Semaglutide, 1 MG/DOSE, (OZEMPIC, 1 MG/DOSE,) 4 MG/3ML SOPN Inject 1 mg into the skin every Thursday.    [provider]  sildenafil (REVATIO) 20 MG tablet Take 1 tablet (20 mg total) by mouth 3 (three) times daily. 06/14/21   Florencia Reasons, MD  Vitamin D, Ergocalciferol, (DRISDOL) 50000 units CAPS capsule Take 50,000 Units by mouth every Wednesday. 01/14/18   [provider]      Allergies    Buprenorphine hcl, Erythromycin, Iodine, Morphine and related, Shellfish allergy, Shellfish-derived products, and Other    Review of Systems   Review of Systems  Constitutional: Negative.   HENT: Negative.    Respiratory: Negative.    Cardiovascular:  Positive for chest pain. Negative for palpitations and leg  swelling.  Gastrointestinal:  Positive for abdominal pain, constipation, diarrhea, nausea and vomiting. Negative for abdominal distention, anal bleeding, blood in stool and rectal pain.  Genitourinary: Negative.   Musculoskeletal: Negative.   Skin: Negative.   Neurological:  Positive for weakness.  All other systems reviewed and are negative.   Physical Exam Updated Vital Signs BP 100/70   Pulse 65   Temp 97.9 F (36.6 C) (Oral)   Resp 10   Ht '5\' 10"'$  (1.778 m)   Wt 114.3 kg   SpO2 100%   BMI 36.16 kg/m  Physical Exam Vitals and nursing note  reviewed.  Constitutional:      General: She is not in acute distress.    Appearance: She is well-developed. She is not ill-appearing, toxic-appearing or diaphoretic.  HENT:     Head: Atraumatic.     Nose: Nose normal.     Mouth/Throat:     Mouth: Mucous membranes are moist.  Eyes:     Pupils: Pupils are equal, round, and reactive to light.  Cardiovascular:     Rate and Rhythm: Normal rate.     Pulses: Normal pulses.          Radial pulses are 2+ on the right side and 2+ on the left side.       Dorsalis pedis pulses are 2+ on the right side and 2+ on the left side.     Heart sounds: Normal heart sounds.     Comments: Nontender chest wall Pulmonary:     Effort: Pulmonary effort is normal. No respiratory distress.     Breath sounds: Normal breath sounds.     Comments: Clear bilaterally, speaks in full sentences Abdominal:     General: Bowel sounds are normal. There is no distension.     Palpations: Abdomen is soft.     Tenderness: There is abdominal tenderness.     Comments: Diffuse tenderness left abdomen upper and lower quadrants without rebound or guarding  Musculoskeletal:        General: No swelling, tenderness, deformity or signs of injury. Normal range of motion.     Cervical back: Normal range of motion.     Right lower leg: No edema.     Left lower leg: No edema.     Comments: No bony tenderness, compartments soft, nontender posterior calves bilaterally, full range of motion without difficulty  Skin:    General: Skin is warm and dry.     Capillary Refill: Capillary refill takes less than 2 seconds.     Comments: No edema, erythema or warmth.  No rashes or lesions  Neurological:     General: No focal deficit present.     Mental Status: She is alert and oriented to person, place, and time.  Psychiatric:        Mood and Affect: Mood normal.    ED Results / Procedures / Treatments   Labs (all labs ordered are listed, but only abnormal results are displayed) Labs  Reviewed  CBC WITH DIFFERENTIAL/PLATELET - Abnormal; Notable for the following components:      Result Value   Hemoglobin 10.9 (*)    MCH 24.1 (*)    MCHC 28.2 (*)    RDW 16.7 (*)    All other components within normal limits  COMPREHENSIVE METABOLIC PANEL - Abnormal; Notable for the following components:   Glucose, Bld 126 (*)    Creatinine, Ser 1.01 (*)    Albumin 3.4 (*)    AST 14 (*)  All other components within normal limits  CBG MONITORING, ED - Abnormal; Notable for the following components:   Glucose-Capillary 128 (*)    All other components within normal limits  LIPASE, BLOOD  BRAIN NATRIURETIC PEPTIDE  D-DIMER, QUANTITATIVE  URINALYSIS, ROUTINE W REFLEX MICROSCOPIC  TROPONIN I (HIGH SENSITIVITY)  TROPONIN I (HIGH SENSITIVITY)    EKG EKG Interpretation  Date/Time:  'Sunday June 23 2022 20:37:57 EDT Ventricular Rate:  69 PR Interval:  87 QRS Duration: 91 QT Interval:  378 QTC Calculation: 405 R Axis:   41 Text Interpretation: Sinus rhythm Abnormal R-wave progression, early transition No significant change since last tracing Confirmed by Schlossman, Erin ([id number]) on 06/23/2022 8:44:43 PM  Radiology DG Chest 2 View  Result Date: 06/23/2022 CLINICAL DATA:  Chest pain and shortness of breath. EXAM: CHEST - 2 VIEW COMPARISON:  12/12/2021 FINDINGS: Cardiac enlargement with pulmonary vascular congestion. Perihilar and basilar and interstitial infiltrates, likely edema. Small bilateral pleural effusions. No pneumothorax. Calcification of the aorta. IMPRESSION: Cardiac enlargement with pulmonary vascular congestion, perihilar edema, and bilateral pleural effusions. Electronically Signed   By: William  Stevens M.D.   On: 06/23/2022 22:37    Procedures Procedures    Medications Ordered in ED Medications  diphenhydrAMINE (BENADRYL) capsule 50 mg (has no administration in time range)    Or  diphenhydrAMINE (BENADRYL) injection 50 mg (has no administration in time range)   HYDROmorphone (DILAUDID) injection 0.5 mg (has no administration in time range)  sodium chloride 0.9 % bolus 1,000 mL (1,000 mLs Intravenous New Bag/Given 06/23/22 2151)  methylPREDNISolone sodium succinate (SOLU-MEDROL) 40 mg/mL injection 40 mg (40 mg Intravenous Given 06/23/22 2151)  fentaNYL (SUBLIMAZE) injection 50 mcg (50 mcg Intravenous Given 06/23/22 2217)    ED Course/ Medical Decision Making/ A&P     66'$  year old with multiple medical comorbidity here for evaluation of syncope. Found slumped over on the toilet hypoxic on her home oxygen, upon awakening has left CP, Abd pain. Was having diarrhea and emesis prior to syncope. Is supposed to have TEE in Aug for possible intracardiac shunt. Has non focal neuro exam here in the ED. Low suspicion for CVA. Subsequently placed back on her home oxygen with appropriate O2.  Labs and imaging personally viewed and interpreted:  CBC without leukocytosis, Hgb 10.9 similar to prior CMP without significant findings Trop 3 Lipase 39 BMP 34 D-dimer WNL EKG without ischemic changes   Care transferred to oncoming provider who will follow-up on CTA  Discussed plan with patient. States she now has worsening CP and left abd pain. Will give meds. CP is reproducible on exam. Pending imaging. She is agreeable for further WU.                               Medical Decision Making Amount and/or Complexity of Data Reviewed Independent Historian: EMS External Data Reviewed: labs, radiology, ECG and notes. Labs: ordered. Decision-making details documented in ED Course. Radiology: ordered and independent interpretation performed. Decision-making details documented in ED Course. ECG/medicine tests: ordered and independent interpretation performed. Decision-making details documented in ED Course.  Risk OTC drugs. Prescription drug management.          Final Clinical Impression(s) / ED Diagnoses Final diagnoses:  None    Rx / DC  Orders ED Discharge Orders     None         Jalah Warmuth A, PA-C 06/23/22 2355    Gareth Morgan,  MD 06/24/22 1120

## 2022-06-24 ENCOUNTER — Emergency Department (HOSPITAL_COMMUNITY): Payer: Medicare Other

## 2022-06-24 ENCOUNTER — Encounter (HOSPITAL_COMMUNITY): Payer: Self-pay | Admitting: Family Medicine

## 2022-06-24 DIAGNOSIS — R1110 Vomiting, unspecified: Secondary | ICD-10-CM

## 2022-06-24 DIAGNOSIS — J9611 Chronic respiratory failure with hypoxia: Secondary | ICD-10-CM

## 2022-06-24 DIAGNOSIS — Z91041 Radiographic dye allergy status: Secondary | ICD-10-CM

## 2022-06-24 DIAGNOSIS — R55 Syncope and collapse: Secondary | ICD-10-CM | POA: Diagnosis not present

## 2022-06-24 DIAGNOSIS — G894 Chronic pain syndrome: Secondary | ICD-10-CM

## 2022-06-24 DIAGNOSIS — E119 Type 2 diabetes mellitus without complications: Secondary | ICD-10-CM

## 2022-06-24 LAB — CBC
HCT: 28.6 % — ABNORMAL LOW (ref 36.0–46.0)
Hemoglobin: 8.4 g/dL — ABNORMAL LOW (ref 12.0–15.0)
MCH: 24.8 pg — ABNORMAL LOW (ref 26.0–34.0)
MCHC: 29.4 g/dL — ABNORMAL LOW (ref 30.0–36.0)
MCV: 84.4 fL (ref 80.0–100.0)
Platelets: 441 10*3/uL — ABNORMAL HIGH (ref 150–400)
RBC: 3.39 MIL/uL — ABNORMAL LOW (ref 3.87–5.11)
RDW: 16.5 % — ABNORMAL HIGH (ref 11.5–15.5)
WBC: 6.5 10*3/uL (ref 4.0–10.5)
nRBC: 0 % (ref 0.0–0.2)

## 2022-06-24 LAB — CREATININE, SERUM
Creatinine, Ser: 0.92 mg/dL (ref 0.44–1.00)
GFR, Estimated: >60 mL/min (ref >60)

## 2022-06-24 LAB — TROPONIN I (HIGH SENSITIVITY): Troponin I (High Sensitivity): 2 ng/L (ref <18)

## 2022-06-24 LAB — CBG MONITORING, ED
Glucose-Capillary: 135 mg/dL — ABNORMAL HIGH (ref 70–99)
Glucose-Capillary: 153 mg/dL — ABNORMAL HIGH (ref 70–99)
Glucose-Capillary: 135 mg/dL — ABNORMAL HIGH (ref 70–99)

## 2022-06-24 LAB — HEMOGLOBIN A1C
Hgb A1c MFr Bld: 5.7 % — ABNORMAL HIGH (ref 4.8–5.6)
Mean Plasma Glucose: 116.89 mg/dL

## 2022-06-24 LAB — GLUCOSE, CAPILLARY: Glucose-Capillary: 125 mg/dL — ABNORMAL HIGH (ref 70–99)

## 2022-06-24 LAB — HIV ANTIBODY (ROUTINE TESTING W REFLEX): HIV Screen 4th Generation wRfx: NONREACTIVE

## 2022-06-24 MED ORDER — OXYCODONE-ACETAMINOPHEN 10-325 MG PO TABS: 1.0000 | ORAL_TABLET | Freq: Three times a day (TID) | ORAL | Status: DC | PRN

## 2022-06-24 MED ORDER — LACTATED RINGERS IV SOLN
INTRAVENOUS | Status: DC
Start: 2022-06-24 — End: 2022-06-25

## 2022-06-24 MED ORDER — ALPRAZOLAM 0.5 MG PO TABS
1.0000 mg | ORAL_TABLET | Freq: Two times a day (BID) | ORAL | Status: DC | PRN
  Administered 2022-06-24 – 2022-06-25 (×2): 1 mg via ORAL
  Filled 2022-06-24 (×3): qty 2

## 2022-06-24 MED ORDER — PANTOPRAZOLE SODIUM 40 MG PO TBEC
40.0000 mg | DELAYED_RELEASE_TABLET | Freq: Every day | ORAL | Status: DC
  Administered 2022-06-24 – 2022-06-25 (×2): 40 mg via ORAL
  Filled 2022-06-24 (×2): qty 1

## 2022-06-24 MED ORDER — ONDANSETRON HCL 4 MG PO TABS: 4.0000 mg | ORAL_TABLET | Freq: Four times a day (QID) | ORAL | Status: DC | PRN

## 2022-06-24 MED ORDER — ENOXAPARIN SODIUM 40 MG/0.4ML IJ SOSY
40.0000 mg | PREFILLED_SYRINGE | INTRAMUSCULAR | Status: DC
  Administered 2022-06-24 – 2022-06-25 (×2): 40 mg via SUBCUTANEOUS
  Filled 2022-06-24 (×2): qty 0.4

## 2022-06-24 MED ORDER — GABAPENTIN 300 MG PO CAPS
300.0000 mg | ORAL_CAPSULE | Freq: Three times a day (TID) | ORAL | Status: DC
  Administered 2022-06-24 – 2022-06-25 (×4): 300 mg via ORAL
  Filled 2022-06-24 (×4): qty 1

## 2022-06-24 MED ORDER — PREDNISONE 20 MG PO TABS
40.0000 mg | ORAL_TABLET | Freq: Every day | ORAL | Status: DC
  Administered 2022-06-24 – 2022-06-25 (×2): 40 mg via ORAL
  Filled 2022-06-24 (×2): qty 2

## 2022-06-24 MED ORDER — PANTOPRAZOLE SODIUM 40 MG PO TBEC: 40.0000 mg | DELAYED_RELEASE_TABLET | Freq: Every day | ORAL | Status: DC

## 2022-06-24 MED ORDER — SILDENAFIL CITRATE 20 MG PO TABS
20.0000 mg | ORAL_TABLET | Freq: Two times a day (BID) | ORAL | Status: DC
  Administered 2022-06-24 – 2022-06-25 (×2): 20 mg via ORAL
  Filled 2022-06-24 (×2): qty 1

## 2022-06-24 MED ORDER — LISINOPRIL 2.5 MG PO TABS
2.5000 mg | ORAL_TABLET | Freq: Every day | ORAL | Status: DC
  Filled 2022-06-24: qty 1

## 2022-06-24 MED ORDER — INSULIN ASPART 100 UNIT/ML IJ SOLN
0.0000 [IU] | Freq: Three times a day (TID) | INTRAMUSCULAR | Status: DC
  Administered 2022-06-24 (×2): 3 [IU] via SUBCUTANEOUS
  Administered 2022-06-24: 4 [IU] via SUBCUTANEOUS
  Administered 2022-06-25: 3 [IU] via SUBCUTANEOUS

## 2022-06-24 MED ORDER — INSULIN ASPART 100 UNIT/ML IJ SOLN: 0.0000 [IU] | Freq: Every day | INTRAMUSCULAR | Status: DC

## 2022-06-24 MED ORDER — SODIUM CHLORIDE 0.9 % IV SOLN: INTRAVENOUS | Status: DC

## 2022-06-24 MED ORDER — EPINEPHRINE 0.3 MG/0.3ML IJ SOAJ: 0.3000 mg | Freq: Once | INTRAMUSCULAR | Status: AC

## 2022-06-24 MED ORDER — DICLOFENAC SODIUM 1 % EX GEL
2.0000 g | Freq: Two times a day (BID) | CUTANEOUS | Status: DC | PRN
Start: 2022-06-24 — End: 2022-06-25

## 2022-06-24 MED ORDER — CARVEDILOL 12.5 MG PO TABS
12.5000 mg | ORAL_TABLET | Freq: Two times a day (BID) | ORAL | Status: DC
  Administered 2022-06-24 – 2022-06-25 (×2): 12.5 mg via ORAL
  Filled 2022-06-24 (×2): qty 1

## 2022-06-24 MED ORDER — SILDENAFIL CITRATE 20 MG PO TABS
20.0000 mg | ORAL_TABLET | Freq: Three times a day (TID) | ORAL | Status: DC
  Administered 2022-06-24: 20 mg via ORAL
  Filled 2022-06-24 (×2): qty 1

## 2022-06-24 MED ORDER — DIPHENHYDRAMINE HCL 25 MG PO CAPS
100.0000 mg | ORAL_CAPSULE | Freq: Every evening | ORAL | Status: DC | PRN
  Administered 2022-06-24: 100 mg via ORAL
  Filled 2022-06-24: qty 4

## 2022-06-24 MED ORDER — IOHEXOL 350 MG/ML SOLN
80.0000 mL | Freq: Once | INTRAVENOUS | Status: AC | PRN
  Administered 2022-06-24: 80 mL via INTRAVENOUS

## 2022-06-24 MED ORDER — OXYCODONE-ACETAMINOPHEN 5-325 MG PO TABS
1.0000 | ORAL_TABLET | Freq: Three times a day (TID) | ORAL | Status: DC | PRN
  Administered 2022-06-24 – 2022-06-25 (×3): 1 via ORAL
  Filled 2022-06-24 (×3): qty 1

## 2022-06-24 MED ORDER — ACETAMINOPHEN 325 MG PO TABS
650.0000 mg | ORAL_TABLET | Freq: Four times a day (QID) | ORAL | Status: DC | PRN
  Administered 2022-06-24: 650 mg via ORAL
  Filled 2022-06-24: qty 2

## 2022-06-24 MED ORDER — FUROSEMIDE 40 MG PO TABS
40.0000 mg | ORAL_TABLET | Freq: Every day | ORAL | Status: DC
  Administered 2022-06-24 – 2022-06-25 (×2): 40 mg via ORAL
  Filled 2022-06-24: qty 1
  Filled 2022-06-24: qty 2
  Filled 2022-06-24: qty 1

## 2022-06-24 MED ORDER — OXYCODONE HCL 5 MG PO TABS
5.0000 mg | ORAL_TABLET | Freq: Three times a day (TID) | ORAL | Status: DC | PRN
  Administered 2022-06-24 – 2022-06-25 (×4): 5 mg via ORAL
  Filled 2022-06-24 (×4): qty 1

## 2022-06-24 MED ORDER — EPINEPHRINE 0.3 MG/0.3ML IJ SOAJ
INTRAMUSCULAR | Status: AC
  Administered 2022-06-24: 0.3 mg via INTRAMUSCULAR
  Filled 2022-06-24: qty 0.3

## 2022-06-24 MED ORDER — DIPHENHYDRAMINE HCL 50 MG/ML IJ SOLN
25.0000 mg | Freq: Four times a day (QID) | INTRAMUSCULAR | Status: DC | PRN
Start: 2022-06-24 — End: 2022-06-24

## 2022-06-24 MED ORDER — ACETAMINOPHEN 650 MG RE SUPP: 650.0000 mg | Freq: Four times a day (QID) | RECTAL | Status: DC | PRN

## 2022-06-24 MED ORDER — ONDANSETRON HCL 4 MG/2ML IJ SOLN: 4.0000 mg | Freq: Four times a day (QID) | INTRAMUSCULAR | Status: DC | PRN

## 2022-06-24 NOTE — Progress Notes (Signed)
   [place] has been requested to perform a transesophageal echocardiogram on [facility] for abnormal bubble study.  After careful review of history and examination, the risks and benefits of transesophageal echocardiogram have been explained including risks of esophageal damage, perforation (1:10,000 risk), bleeding, pharyngeal hematoma as well as other potential complications associated with conscious sedation including aspiration, arrhythmia, respiratory failure and death. Alternatives to treatment were discussed, questions were answered. Patient is willing to proceed.   Pt scheduled tomorrow with Dr. Sallyanne Kuster. NPO at [facility] please.   Tami Lin [place], [place]  06/24/2022 2:24 PM

## 2022-06-24 NOTE — ED Notes (Signed)
Pt came back from CT c/o shob and tongue swelling. PA and RN at bedsid. Pt transferred into room, placed on monitor and given epi pen.

## 2022-06-24 NOTE — Assessment & Plan Note (Addendum)
Intracardiac shunt Patient had an echocardiogram in April 2023 with positive bubble study Recently evaluated by cardiologist, Dr. Haroldine Laws with plans to arrange for TEE to evaluate intra-cardiac shunting.  Consider cardiology consult in the a.m. for possible inpatient TEE

## 2022-06-24 NOTE — Evaluation (Signed)
Physical Therapy Evaluation Patient Details Name: Teresa Wiley MRN: [id number] DOB: [birth year] Today's Date: 06/24/2022  History of Present Illness  Pt is a 66 y/o female admitted secondary to syncope. PMH includes DM, HTN, pulmonary HTN, COPD, PE, and heart failure.  Clinical Impression  Pt admitted secondary to problem above with deficits below. Pt requiring min guard A for transfers this session. Reporting increased dizziness and fatigue, and did not want to perform further mobility. Orthostatics below. Reports spouse is at home but has health issues. Reports she has a son that can help as needed. Will continue to follow acutely.    06/24/22 1428 06/24/22 1437  Orthostatic Lying   BP- Lying 137/76  --   Pulse- Lying 88  --   Orthostatic Sitting  BP- Sitting 120/62 114/73 (after standing)  Pulse- Sitting 87 95         Recommendations for follow up therapy are one component of a multi-disciplinary discharge planning process, led by the attending physician.  Recommendations may be updated based on patient status, additional functional criteria and insurance authorization.  Follow Up Recommendations Home health PT      Assistance Recommended at Discharge Intermittent Supervision/Assistance  Patient can return home with the following  A little help with bathing/dressing/bathroom;Assistance with cooking/housework;Assist for transportation    Equipment Recommendations Rollator (4 wheels)  Recommendations for Other Services       Functional Status Assessment Patient has had a recent decline in their functional status and demonstrates the ability to make significant improvements in function in a reasonable and predictable amount of time.     Precautions / Restrictions Precautions Precautions: Fall Restrictions Weight Bearing Restrictions: No      Mobility  Bed Mobility Overal bed mobility: Needs Assistance Bed Mobility: Supine to Sit, Sit to Supine     Supine to sit:  Supervision Sit to supine: Supervision   General bed mobility comments: Supervision for safety    Transfers Overall transfer level: Needs assistance Equipment used: None Transfers: Sit to/from Stand Sit to Stand: Min guard           General transfer comment: Min guard for safety to stand. Unable to tolerate BP in standing on both attempts secondary to BP cuff pinching her and dizziness.    Ambulation/Gait                  Stairs            Wheelchair Mobility    Modified Rankin (Stroke Patients Only)       Balance Overall balance assessment: Needs assistance Sitting-balance support: No upper extremity supported, Feet supported Sitting balance-Leahy Scale: Fair     Standing balance support: No upper extremity supported, During functional activity Standing balance-Leahy Scale: Fair                               Pertinent Vitals/Pain Pain Assessment Pain Assessment: Faces Faces Pain Scale: Hurts little more Pain Location: generalized Pain Descriptors / Indicators: Grimacing, Guarding Pain Intervention(s): Limited activity within patient's tolerance, Monitored during session, Repositioned    Home Living Family/patient expects to be discharged to:: Private residence Living Arrangements: Spouse/significant other;Children Available Help at Discharge: Family;Available 24 hours/day Type of Home: House Home Access: Stairs to enter Entrance Stairs-Rails: None Entrance Stairs-Number of Steps: 4 Alternate Level Stairs-Number of Steps: flight Home Layout: Two level Home Equipment: None      Prior Function Prior Level of Function :  Independent/Modified Independent                     Hand Dominance        Extremity/Trunk Assessment   Upper Extremity Assessment Upper Extremity Assessment: Defer to OT evaluation    Lower Extremity Assessment Lower Extremity Assessment: Generalized weakness    Cervical / Trunk  Assessment Cervical / Trunk Assessment: Normal  Communication   Communication: No difficulties  Cognition Arousal/Alertness: Awake/alert Behavior During Therapy: Flat affect, Restless Overall Cognitive Status: No family/caregiver present to determine baseline cognitive functioning                                 General Comments: Likely close to baseline        General Comments General comments (skin integrity, edema, etc.): No family present    Exercises     Assessment/Plan    PT Assessment Patient needs continued PT services  PT Problem List Decreased strength;Decreased balance;Decreased activity tolerance;Decreased mobility;Decreased knowledge of use of DME;Decreased knowledge of precautions       PT Treatment Interventions DME instruction;Gait training;Stair training;Functional mobility training;Therapeutic activities;Therapeutic exercise;Balance training;Patient/family education    PT Goals (Current goals can be found in the Care Plan section)  Acute Rehab PT Goals Patient Stated Goal: to go home PT Goal Formulation: With patient Time For Goal Achievement: 07/08/22 Potential to Achieve Goals: Good    Frequency Min 3X/week     Co-evaluation PT/OT/SLP Co-Evaluation/Treatment: Yes Reason for Co-Treatment: For patient/therapist safety;To address functional/ADL transfers PT goals addressed during session: Balance;Mobility/safety with mobility         AM-PAC PT "6 Clicks" Mobility  Outcome Measure Help needed turning from your back to your side while in a flat bed without using bedrails?: A Little Help needed moving from lying on your back to sitting on the side of a flat bed without using bedrails?: A Little Help needed moving to and from a bed to a chair (including a wheelchair)?: A Little Help needed standing up from a chair using your arms (e.g., wheelchair or bedside chair)?: A Little Help needed to walk in hospital room?: A Little Help needed  climbing 3-5 steps with a railing? : A Little 6 Click Score: 18    End of Session   Activity Tolerance: Patient limited by fatigue Patient left: in bed;with call bell/phone within reach (on stretcher in ED) Nurse Communication: Mobility status PT Visit Diagnosis: Other abnormalities of gait and mobility (R26.89);Muscle weakness (generalized) (M62.81)    Time: 5009-3818 PT Time Calculation (min) (ACUTE ONLY): 17 min   Charges:   PT Evaluation $PT Eval Moderate Complexity: 1 Mod          Reuel Derby, PT, DPT  Acute Rehabilitation Services  Office: [phone]   Rudean Hitt 06/24/2022, 3:00 PM

## 2022-06-24 NOTE — Assessment & Plan Note (Addendum)
Possible acute gastroenteritis CTA aorta showed peripancreatic stranding but lipase normal Patient reports she had a normal endoscopy a month prior Clear liquid diet to advance as tolerated IV Protonix

## 2022-06-24 NOTE — Care Management Obs Status (Signed)
[facility] NOTIFICATION   Patient Details  Name: Teresa Wiley MRN: [id number] Date of Birth: 1956-08-24   Medicare Observation Status Notification Given:  Yes  In an effort to limit exposure to Covid-19, RNCM explained nature of visit (observation notice) and obtained permission to sign forms.      Fuller Mandril, RN 06/24/2022, 2:00 PM

## 2022-06-24 NOTE — Assessment & Plan Note (Signed)
Judicious narcotic use

## 2022-06-24 NOTE — ED Notes (Signed)
Checked patient cbg it was 135 notified patient is resting with call bell in reach

## 2022-06-24 NOTE — Assessment & Plan Note (Signed)
Complicating factor to overall prognosis and care 

## 2022-06-24 NOTE — Assessment & Plan Note (Signed)
Sliding scale insulin coverage 

## 2022-06-24 NOTE — ED Notes (Signed)
Paged Wynetta Emery, C MD about pt medications (lisinopril and coreg)

## 2022-06-24 NOTE — Progress Notes (Signed)
ASSUMPTION OF CARE NOTE   06/24/2022 12:54 PM  Teresa Wiley was seen and examined.  The H&P by the admitting provider, orders, imaging was reviewed.  Please see new orders.  Will continue to follow.   66 y.o. female with medical history significant for Pulmonary hypertension on sildenafil, history of PE on Eliquis, diabetes COPD, chronic respiratory failure on home O2 at 3 L, HTN, history of PE, HFpEF, EF 55 to 60%, obesity, chronic pain on chronic narcotics with a history of recurrent syncope, last admitted for the same in July 2022 who presents to the ED with a syncopal episode while sitting on the commode having a bowel movement.  She was found slumped over by EMS with an O2 sat of 77 on home flow rate.  She recalled having multiple episodes of watery diarrhea followed by episodes of emesis and the last thing was being woken up slumped over.  On arrival to the ED she complained of pain on the left side of her abdomen and nausea as well as pain on the left arm and leg.  She denies falling up on that side or leaning against that side.  ED course and data review: Vitals within normal limits and O2 sat 97% on home flow rate of 3 L.  Labs hemoglobin 10.9, D-dimer less than 0.7, troponin 2 and BNP 34.6, CMP mostly unremarkable, lipase 39 EKG, personally viewed and interpreted: Sinus at 69 with no acute ST-T wave changes.    CT angio chest abdomen and pelvis aorta showing the following IMPRESSION: 1. No aortic dissection or aneurysm. 2. Findings concerning for acute pancreatitis. Correlation with pancreatic enzymes recommended. 3. No bowel obstruction. Normal appendix   Hospitalist consulted for observation admit Of note patient was premedicated prior to her CT given a contrast allergy history.  Following his CT she cannot planes of an itchy throat and was administered an EpiPen shot.  She had no further complaints  Medication Reconciliation Updated and Completed.   Vitals:   06/24/22 1115  06/24/22 1253  BP: 137/89   Pulse: 93   Resp: 14   Temp:  98.3 F (36.8 C)  SpO2: 95%     Results for orders placed or performed during the hospital encounter of 06/23/22  CBC with Differential  Result Value Ref Range   WBC 4.4 4.0 - 10.5 K/uL   RBC 4.53 3.87 - 5.11 MIL/uL   Hemoglobin 10.9 (L) 12.0 - 15.0 g/dL   HCT 38.7 36.0 - 46.0 %   MCV 85.4 80.0 - 100.0 fL   MCH 24.1 (L) 26.0 - 34.0 pg   MCHC 28.2 (L) 30.0 - 36.0 g/dL   RDW 16.7 (H) 11.5 - 15.5 %   Platelets 379 150 - 400 K/uL   nRBC 0.0 0.0 - 0.2 %   Neutrophils Relative % 41 %   Neutro Abs 1.8 1.7 - 7.7 K/uL   Lymphocytes Relative 40 %   Lymphs Abs 1.8 0.7 - 4.0 K/uL   Monocytes Relative 14 %   Monocytes Absolute 0.6 0.1 - 1.0 K/uL   Eosinophils Relative 4 %   Eosinophils Absolute 0.2 0.0 - 0.5 K/uL   Basophils Relative 1 %   Basophils Absolute 0.0 0.0 - 0.1 K/uL   Immature Granulocytes 0 %   Abs Immature Granulocytes 0.00 0.00 - 0.07 K/uL  Comprehensive metabolic panel  Result Value Ref Range   Sodium 138 135 - 145 mmol/L   Potassium 4.2 3.5 - 5.1 mmol/L   Chloride 99  98 - 111 mmol/L   CO2 31 22 - 32 mmol/L   Glucose, Bld 126 (H) 70 - 99 mg/dL   BUN 10 8 - 23 mg/dL   Creatinine, Ser 1.01 (H) 0.44 - 1.00 mg/dL   Calcium 9.2 8.9 - 10.3 mg/dL   Total Protein 7.3 6.5 - 8.1 g/dL   Albumin 3.4 (L) 3.5 - 5.0 g/dL   AST 14 (L) 15 - 41 U/L   ALT 10 0 - 44 U/L   Alkaline Phosphatase 92 38 - 126 U/L   Total Bilirubin 0.4 0.3 - 1.2 mg/dL   GFR, Estimated >60 >60 mL/min   Anion gap 8 5 - 15  Lipase, blood  Result Value Ref Range   Lipase 39 11 - 51 U/L  Brain natriuretic peptide  Result Value Ref Range   B Natriuretic Peptide 34.6 0.0 - 100.0 pg/mL  D-dimer, quantitative  Result Value Ref Range   D-Dimer, Quant <0.27 0.00 - 0.50 ug/mL-FEU  CBC  Result Value Ref Range   WBC 6.5 4.0 - 10.5 K/uL   RBC 3.39 (L) 3.87 - 5.11 MIL/uL   Hemoglobin 8.4 (L) 12.0 - 15.0 g/dL   HCT 28.6 (L) 36.0 - 46.0 %   MCV 84.4  80.0 - 100.0 fL   MCH 24.8 (L) 26.0 - 34.0 pg   MCHC 29.4 (L) 30.0 - 36.0 g/dL   RDW 16.5 (H) 11.5 - 15.5 %   Platelets 441 (H) 150 - 400 K/uL   nRBC 0.0 0.0 - 0.2 %  Creatinine, serum  Result Value Ref Range   Creatinine, Ser 0.92 0.44 - 1.00 mg/dL   GFR, Estimated >60 >60 mL/min  HIV Antibody (routine testing w rflx)  Result Value Ref Range   HIV Screen 4th Generation wRfx Non Reactive Non Reactive  Hemoglobin A1c  Result Value Ref Range   Hgb A1c MFr Bld 5.7 (H) 4.8 - 5.6 %   Mean Plasma Glucose 116.89 mg/dL  CBG monitoring, ED  Result Value Ref Range   Glucose-Capillary 128 (H) 70 - 99 mg/dL  CBG monitoring, ED  Result Value Ref Range   Glucose-Capillary 135 (H) 70 - 99 mg/dL  CBG monitoring, ED  Result Value Ref Range   Glucose-Capillary 153 (H) 70 - 99 mg/dL  Troponin I (High Sensitivity)  Result Value Ref Range   Troponin I (High Sensitivity) 3 <18 ng/L  Troponin I (High Sensitivity)  Result Value Ref Range   Troponin I (High Sensitivity) 2 <18 ng/L   C. Wynetta Emery, MD [facility]   06/23/2022  8:34 PM How to contact the [facility] Attending or Consulting provider [place] or covering provider during after hours [place], for this patient?  Check the care team in [facility] and look for a) attending/consulting [facility] provider listed and b) the [facility] team listed Log into [email or web address] and use [place] universal password to access. If you do not have the password, please contact the hospital operator. Locate the [facility] provider you are looking for under [facility] and page to a number that you can be directly reached. If you still have difficulty reaching the provider, please page the [facility] (Director on Call) for the Hospitalists listed on amion for assistance.
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
## 2022-06-24 NOTE — ED Notes (Signed)
Changed patient bed placed another brief patient is resting with call bell in reach

## 2022-06-24 NOTE — Assessment & Plan Note (Signed)
Continue carvedilol 

## 2022-06-24 NOTE — ED Notes (Signed)
Checked patient cbg it was 68 notified RN of blood sugar help change patient brief patient is resting with call bell in reach

## 2022-06-24 NOTE — Assessment & Plan Note (Signed)
-   Continue sildenafil °

## 2022-06-24 NOTE — Assessment & Plan Note (Signed)
Continue home oxygen.  O2 sats in the high 90s on home O2 at baseline flow rate

## 2022-06-24 NOTE — ED Notes (Signed)
Paged cards master to try to figure out who to get in touch with regarding pt saying she doesn't take lisinopril anymore and that she takes coreg 12.'5mg'$   bid. Awaiting call back.

## 2022-06-24 NOTE — ED Provider Notes (Signed)
  Physical Exam  BP 135/79   Pulse 79   Temp 98.1 F (36.7 C)   Resp 17   Ht '5\' 10"'$  (1.778 m)   Wt 114.3 kg   SpO2 96%   BMI 36.16 kg/m   Physical Exam Vitals and nursing note reviewed.  Constitutional:      General: She is not in acute distress.    Appearance: She is well-developed.  HENT:     Head: Normocephalic and atraumatic.     Mouth/Throat:     Comments: No tongue swelling Eyes:     Conjunctiva/sclera: Conjunctivae normal.  Cardiovascular:     Rate and Rhythm: Normal rate.     Heart sounds: No murmur heard. Pulmonary:     Effort: Pulmonary effort is normal. No respiratory distress.     Comments: CTAB Abdominal:     General: There is no distension.  Musculoskeletal:     Cervical back: Neck supple.     Comments: Moves all extremities  Skin:    General: Skin is warm and dry.  Neurological:     Mental Status: She is alert and oriented to person, place, and time.  Psychiatric:        Mood and Affect: Mood normal.        Behavior: Behavior normal.     Procedures  Procedures  ED Course / MDM   Clinical Course as of 06/24/22 0233  Mon Jun 24, 2022  0231 Notified by radiology tech that after receiving contrast, the patient developed SOB and felt like her tongue was swelling.  She was premedicated per protocol.  She doesn't have any tongue swelling on my exam, she is able to speak, she doesn't have any hives.  However, given how patient feels, she is given an epipen.  She is moved to room 18 from hall 12 and placed on monitors. [RB]    Clinical Course User Index [RB] Montine Circle, PA-C   Medical Decision Making Amount and/or Complexity of Data Reviewed Labs: ordered. Radiology: ordered.  Risk Prescription drug management. Decision regarding hospitalization.   I consulted with Dr. Damita Dunnings, who is appreciate for admitting.       Montine Circle, PA-C 06/24/22 0617    Fatima Blank, MD 06/25/22 0900

## 2022-06-24 NOTE — Evaluation (Signed)
Occupational Therapy Evaluation Patient Details Name: Teresa Wiley MRN: [id number] DOB: 08-25-1956 Today's Date: 06/24/2022   History of Present Illness Pt is a 66 y/o female admitted secondary to syncope. PMH includes DM, HTN, pulmonary HTN, COPD, PE, and heart failure.   Clinical Impression   PTA, pt was living with her wife and son and was performing BADLs and simple IADLs. Pt currently performing at [place] level. Reporting slight dizziness with standing; see BP below.  Pt presenting with decreased activity tolerance and strength; however, unsure of how far this is from baseline. Pt would benefit from further acute OT to facilitate safe dc. Recommend dc to home once medically stable with HHOT for further OT to optimize safety, independence with ADLs, and return to PLOF.    Orthostatic Lying   BP- Lying 137/76  Pulse- Lying 88  Orthostatic Sitting  BP- Sitting 120/62  Pulse- Sitting 87  Orthostatic Sitting (after standing)  BP sitting 114/73  Pulse sitting 95       Recommendations for follow up therapy are one component of a multi-disciplinary discharge planning process, led by the attending physician.  Recommendations may be updated based on patient status, additional functional criteria and insurance authorization.   Follow Up Recommendations  Home health OT    Assistance Recommended at Discharge Intermittent Supervision/Assistance  Patient can return home with the following      Functional Status Assessment  Patient has had a recent decline in their functional status and demonstrates the ability to make significant improvements in function in a reasonable and predictable amount of time.  Equipment Recommendations  None recommended by OT    Recommendations for Other Services       Precautions / Restrictions Precautions Precautions: Fall Restrictions Weight Bearing Restrictions: No      Mobility Bed Mobility Overal bed mobility: Needs Assistance Bed  Mobility: Supine to Sit, Sit to Supine     Supine to sit: Supervision Sit to supine: Supervision   General bed mobility comments: Supervision for safety    Transfers Overall transfer level: Needs assistance Equipment used: None Transfers: Sit to/from Stand Sit to Stand: Min guard           General transfer comment: Min guard for safety to stand. Unable to tolerate BP in standing on both attempts secondary to BP cuff pinching her and dizziness.      Balance Overall balance assessment: Needs assistance Sitting-balance support: No upper extremity supported, Feet supported Sitting balance-Leahy Scale: Fair     Standing balance support: No upper extremity supported, During functional activity Standing balance-Leahy Scale: Fair                             ADL either performed or assessed with clinical judgement   ADL Overall ADL's : Needs assistance/impaired Eating/Feeding: Set up;Sitting   Grooming: Set up;Sitting   Upper Body Bathing: Set up;Sitting   Lower Body Bathing: Min guard;Sit to/from stand   Upper Body Dressing : Set up;Sitting   Lower Body Dressing: Minimal assistance;Bed level Lower Body Dressing Details (indicate cue type and reason): figure four in bed with Min A to initiate Toilet Transfer: Min guard           Functional mobility during ADLs: Min guard General ADL Comments: Pt performing at [place] guard-Min A level. Feel she is clsoe to baseline performance.     Vision         Perception  Praxis      Pertinent Vitals/Pain Pain Assessment Pain Assessment: Faces Faces Pain Scale: Hurts little more Pain Location: generalized Pain Descriptors / Indicators: Grimacing, Guarding Pain Intervention(s): Monitored during session, Limited activity within patient's tolerance, Repositioned     Hand Dominance Right   Extremity/Trunk Assessment Upper Extremity Assessment Upper Extremity Assessment: Overall WFL for tasks assessed    Lower Extremity Assessment Lower Extremity Assessment: Defer to PT evaluation   Cervical / Trunk Assessment Cervical / Trunk Assessment: Normal   Communication Communication Communication: No difficulties   Cognition Arousal/Alertness: Awake/alert Behavior During Therapy: Flat affect, Restless Overall Cognitive Status: No family/caregiver present to determine baseline cognitive functioning                                 General Comments: Likely close to baseline. Soft spoken. Becoming quickly annoyed with BP cuff and pulling it off during orthostatic BP     General Comments  BP with slight drop in standing    Exercises     Shoulder Instructions      Home Living Family/patient expects to be discharged to:: Private residence Living Arrangements: Spouse/significant other;Children Available Help at Discharge: Family;Available 24 hours/day Type of Home: House Home Access: Stairs to enter [employer] of Steps: 4 Entrance Stairs-Rails: None Home Layout: Two level Alternate Level Stairs-Number of Steps: flight Alternate Level Stairs-Rails: Right Bathroom Shower/Tub: Teacher, early years/pre: Handicapped height     Home Equipment: None          Prior Functioning/Environment Prior Level of Function : Independent/Modified Independent               ADLs Comments: ADLs and simple IADLs.        OT Problem List: Decreased activity tolerance;Decreased strength;Impaired balance (sitting and/or standing);Decreased knowledge of use of DME or AE;Decreased knowledge of precautions      OT Treatment/Interventions: Self-care/ADL training;Therapeutic exercise;Energy conservation;DME and/or AE instruction;Therapeutic activities;Patient/family education    OT Goals(Current goals can be found in the care plan section) Acute Rehab OT Goals Patient Stated Goal: Go home OT Goal Formulation: With patient Time For Goal Achievement:  07/08/22 Potential to Achieve Goals: Good  OT Frequency: Min 2X/week    Co-evaluation PT/OT/SLP Co-Evaluation/Treatment: Yes Reason for Co-Treatment: For patient/therapist safety;To address functional/ADL transfers PT goals addressed during session: Balance;Mobility/safety with mobility OT goals addressed during session: ADL's and self-care      AM-PAC OT "6 Clicks" Daily Activity     Outcome Measure Help from another person eating meals?: None Help from another person taking care of personal grooming?: A Little Help from another person toileting, which includes using toliet, bedpan, or urinal?: A Little Help from another person bathing (including washing, rinsing, drying)?: A Little Help from another person to put on and taking off regular upper body clothing?: A Little Help from another person to put on and taking off regular lower body clothing?: A Little 6 Click Score: 19   End of Session Nurse Communication: Mobility status  Activity Tolerance: Patient tolerated treatment well Patient left: in bed;with call bell/phone within reach  OT Visit Diagnosis: Unsteadiness on feet (R26.81);Other abnormalities of gait and mobility (R26.89);Muscle weakness (generalized) (M62.81)                Time: 8756-4332 OT Time Calculation (min): 18 min Charges:  OT General Charges $OT Visit: 1 Visit OT Evaluation $OT Eval Moderate Complexity: 1 Mod  Vanessa Alesi MSOT, OTR/L Acute Rehab Office: [place] 06/24/2022, 3:24 PM

## 2022-06-24 NOTE — Assessment & Plan Note (Signed)
Patient was medicated but had some itching of her throat after the contrasted CT scan requiring EpiPen We will continue Benadryl and prednisone

## 2022-06-25 ENCOUNTER — Encounter (HOSPITAL_COMMUNITY)
Admission: EM | Disposition: A | Discharge status: Home Health | Payer: Self-pay | Source: Home / Self Care | Attending: Family Medicine

## 2022-06-25 ENCOUNTER — Telehealth: Payer: Self-pay | Admitting: Physician Assistant

## 2022-06-25 ENCOUNTER — Observation Stay (HOSPITAL_COMMUNITY): Payer: Medicare Other | Admitting: Anesthesiology

## 2022-06-25 ENCOUNTER — Observation Stay (HOSPITAL_BASED_OUTPATIENT_CLINIC_OR_DEPARTMENT_OTHER): Payer: Medicare Other

## 2022-06-25 ENCOUNTER — Encounter (HOSPITAL_COMMUNITY): Payer: Self-pay | Admitting: Family Medicine

## 2022-06-25 ENCOUNTER — Observation Stay (HOSPITAL_BASED_OUTPATIENT_CLINIC_OR_DEPARTMENT_OTHER): Payer: Medicare Other | Admitting: Anesthesiology

## 2022-06-25 DIAGNOSIS — G894 Chronic pain syndrome: Secondary | ICD-10-CM | POA: Diagnosis not present

## 2022-06-25 DIAGNOSIS — R1110 Vomiting, unspecified: Secondary | ICD-10-CM

## 2022-06-25 DIAGNOSIS — R55 Syncope and collapse: Secondary | ICD-10-CM | POA: Diagnosis not present

## 2022-06-25 DIAGNOSIS — Z6835 Body mass index (BMI) 35.0-35.9, adult: Secondary | ICD-10-CM

## 2022-06-25 DIAGNOSIS — I10 Essential (primary) hypertension: Secondary | ICD-10-CM | POA: Diagnosis not present

## 2022-06-25 DIAGNOSIS — Q211 Atrial septal defect, unspecified: Secondary | ICD-10-CM

## 2022-06-25 DIAGNOSIS — J449 Chronic obstructive pulmonary disease, unspecified: Secondary | ICD-10-CM | POA: Diagnosis not present

## 2022-06-25 DIAGNOSIS — Z91041 Radiographic dye allergy status: Secondary | ICD-10-CM | POA: Diagnosis not present

## 2022-06-25 DIAGNOSIS — Q2111 Secundum atrial septal defect: Secondary | ICD-10-CM

## 2022-06-25 DIAGNOSIS — I272 Pulmonary hypertension, unspecified: Secondary | ICD-10-CM

## 2022-06-25 DIAGNOSIS — R197 Diarrhea, unspecified: Secondary | ICD-10-CM

## 2022-06-25 DIAGNOSIS — Z87891 Personal history of nicotine dependence: Secondary | ICD-10-CM

## 2022-06-25 DIAGNOSIS — E6609 Other obesity due to excess calories: Secondary | ICD-10-CM

## 2022-06-25 HISTORY — PX: TEE WITHOUT CARDIOVERSION: SHX5443

## 2022-06-25 LAB — BASIC METABOLIC PANEL
Anion gap: 6 (ref 5–15)
BUN: 12 mg/dL (ref 8–23)
CO2: 30 mmol/L (ref 22–32)
Calcium: 9.1 mg/dL (ref 8.9–10.3)
Chloride: 104 mmol/L (ref 98–111)
Creatinine, Ser: 0.92 mg/dL (ref 0.44–1.00)
GFR, Estimated: >60 mL/min (ref >60)
Glucose, Bld: 107 mg/dL — ABNORMAL HIGH (ref 70–99)
Potassium: 3.8 mmol/L (ref 3.5–5.1)
Sodium: 140 mmol/L (ref 135–145)

## 2022-06-25 LAB — CBC
HCT: 34.3 % — ABNORMAL LOW (ref 36.0–46.0)
Hemoglobin: 10 g/dL — ABNORMAL LOW (ref 12.0–15.0)
MCH: 24.1 pg — ABNORMAL LOW (ref 26.0–34.0)
MCHC: 29.2 g/dL — ABNORMAL LOW (ref 30.0–36.0)
MCV: 82.7 fL (ref 80.0–100.0)
Platelets: 325 10*3/uL (ref 150–400)
RBC: 4.15 MIL/uL (ref 3.87–5.11)
RDW: 17.1 % — ABNORMAL HIGH (ref 11.5–15.5)
WBC: 6.8 10*3/uL (ref 4.0–10.5)
nRBC: 0 % (ref 0.0–0.2)

## 2022-06-25 LAB — LIPASE, BLOOD: Lipase: 36 U/L (ref 11–51)

## 2022-06-25 LAB — GLUCOSE, CAPILLARY
Glucose-Capillary: 112 mg/dL — ABNORMAL HIGH (ref 70–99)
Glucose-Capillary: 141 mg/dL — ABNORMAL HIGH (ref 70–99)

## 2022-06-25 SURGERY — ECHOCARDIOGRAM, TRANSESOPHAGEAL
Anesthesia: Monitor Anesthesia Care

## 2022-06-25 MED ORDER — OXYCODONE HCL 5 MG PO TABS: 5.0000 mg | ORAL_TABLET | Freq: Four times a day (QID) | ORAL | Status: DC | PRN

## 2022-06-25 MED ORDER — PROPOFOL 500 MG/50ML IV EMUL
INTRAVENOUS | Status: DC | PRN
  Administered 2022-06-25: 150 ug/kg/min via INTRAVENOUS

## 2022-06-25 MED ORDER — SODIUM CHLORIDE 0.9 % IV SOLN: INTRAVENOUS | Status: DC

## 2022-06-25 MED ORDER — OXYCODONE-ACETAMINOPHEN 5-325 MG PO TABS: 1.0000 | ORAL_TABLET | Freq: Four times a day (QID) | ORAL | Status: DC | PRN

## 2022-06-25 NOTE — Plan of Care (Signed)

## 2022-06-25 NOTE — Anesthesia Postprocedure Evaluation (Signed)
Anesthesia Post Note  Patient: Abigayle Wilinski  Procedure(s) Performed: TRANSESOPHAGEAL ECHOCARDIOGRAM (TEE)     Patient location during evaluation: PACU Anesthesia Type: MAC Level of consciousness: awake and alert Pain management: pain level controlled Vital Signs Assessment: post-procedure vital signs reviewed and stable Respiratory status: spontaneous breathing, nonlabored ventilation, respiratory function stable and patient connected to nasal cannula oxygen Cardiovascular status: stable and blood pressure returned to baseline Postop Assessment: no apparent nausea or vomiting Anesthetic complications: no   No notable events documented.  Last Vitals:  Vitals:   06/25/22 1110 06/25/22 1125  BP: 136/76 119/79  Pulse: 76   Resp: 18 17  Temp: 36.9 C 36.9 C  SpO2: 94% 93%    Last Pain:  Vitals:   06/25/22 1024  TempSrc: Temporal  PainSc: 6                  Durrell Barajas,W. EDMOND

## 2022-06-25 NOTE — Anesthesia Preprocedure Evaluation (Addendum)
Anesthesia Evaluation  Patient identified by MRN, date of birth, ID band Patient awake    Reviewed: Allergy & Precautions, H&P , NPO status , Patient's Chart, lab work & pertinent test results  Airway Mallampati: III  TM Distance: >3 FB Neck ROM: Full    Dental no notable dental hx. (+) Edentulous Upper, Edentulous Lower, Dental Advisory Given   Pulmonary asthma , sleep apnea , COPD, former smoker,    Pulmonary exam normal breath sounds clear to auscultation       Cardiovascular hypertension, Pt. on medications and Pt. on home beta blockers  Rhythm:Regular Rate:Normal     Neuro/Psych Anxiety CVA    GI/Hepatic Neg liver ROS, GERD  Medicated,  Endo/Other  diabetesMorbid obesity  Renal/GU Renal disease  negative genitourinary   Musculoskeletal  (+) Arthritis , Osteoarthritis,    Abdominal   Peds  Hematology negative hematology ROS (+)   Anesthesia Other Findings   Reproductive/Obstetrics negative OB ROS                            Anesthesia Physical Anesthesia Plan  ASA: 3  Anesthesia Plan: MAC   Post-op Pain Management: Minimal or no pain anticipated   Induction: Intravenous  PONV Risk Score and Plan: 2 and Propofol infusion and Treatment may vary due to age or medical condition  Airway Management Planned: Natural Airway and Nasal Cannula  Additional Equipment:   Intra-op Plan:   Post-operative Plan:   Informed Consent: I have reviewed the patients History and Physical, chart, labs and discussed the procedure including the risks, benefits and alternatives for the proposed anesthesia with the patient or authorized representative who has indicated his/her understanding and acceptance.     Dental advisory given  Plan Discussed with: CRNA  Anesthesia Plan Comments:         Anesthesia Quick Evaluation

## 2022-06-25 NOTE — Progress Notes (Signed)
Attempted to get orthostatics before d/c and pt has refused per SWOT nurse and states that she just wants to go.

## 2022-06-25 NOTE — Anesthesia Procedure Notes (Signed)
Procedure Name: MAC Date/Time: 06/25/2022 10:50 AM  Performed by: Lieutenant Diego, CRNAPre-anesthesia Checklist: Patient identified, Emergency Drugs available, Suction available, Patient being monitored and Timeout performed Patient Re-evaluated:Patient Re-evaluated prior to induction Oxygen Delivery Method: Nasal cannula Preoxygenation: Pre-oxygenation with 100% oxygen Induction Type: IV induction

## 2022-06-25 NOTE — Progress Notes (Signed)
PT Cancellation Note  Patient Details Name: Teresa Wiley MRN: [id number] DOB: Apr 07, 1956   Cancelled Treatment:    Reason Eval/Treat Not Completed: (P) Patient declined, no reason specified (Pt reports she is feeling tired from procedure and she is in some pain.  PTA educated on the benefits of mobility and patient mildly irritated with PTA's persistence.  Will f/u per POC.)   [name] Eli Hose 06/25/2022, 2:39 PM  Erasmo Leventhal , PTA Acute Rehabilitation Services Office [phone]

## 2022-06-25 NOTE — Progress Notes (Signed)
  Echocardiogram Echocardiogram Transesophageal has been performed.  Teresa Wiley M 06/25/2022, 12:15 PM

## 2022-06-25 NOTE — Interval H&P Note (Signed)
History and Physical Interval Note:  06/25/2022 10:15 AM  Teresa Wiley  has presented today for surgery, with the diagnosis of abnormal bubble study.  The various methods of treatment have been discussed with the patient and family. After consideration of risks, benefits and other options for treatment, the patient has consented to  Procedure(s): TRANSESOPHAGEAL ECHOCARDIOGRAM (TEE) (N/A) as a surgical intervention.  The patient's history has been reviewed, patient examined, no change in status, stable for surgery.  I have reviewed the patient's chart and labs.  Questions were answered to the patient's satisfaction.     Laurice Iglesia

## 2022-06-25 NOTE — Transfer of Care (Signed)
Immediate Anesthesia Transfer of Care Note  Patient: Teresa Wiley  Procedure(s) Performed: TRANSESOPHAGEAL ECHOCARDIOGRAM (TEE)  Patient Location: PACU  Anesthesia Type:MAC  Level of Consciousness: awake  Airway & Oxygen Therapy: Patient Spontanous Breathing and Patient connected to nasal cannula oxygen  Post-op Assessment: Report given to RN and Post -op Vital signs reviewed and stable  Post vital signs: Reviewed and stable  Last Vitals:  Vitals Value Taken Time  BP 136/76 06/25/22 1108  Temp    Pulse 76 06/25/22 1110  Resp 18 06/25/22 1110  SpO2 94 % 06/25/22 1110  Vitals shown include unvalidated device data.  Last Pain:  Vitals:   06/25/22 1024  TempSrc: Temporal  PainSc: 6       Patients Stated Pain Goal: 0 (19/41/74 0814)  Complications: No notable events documented.

## 2022-06-25 NOTE — Telephone Encounter (Signed)
Dr. Bonner Puna had reached out during this admission to review TEE findings with our team. Dr. Burt Knack with our structural team had messaged Dr. Sung Amabile about her who is currently out. Per his thoughts, the ASD is small with L->R rather than right ->left flow. She had no O2 step-up at cath earlier this year. He would recommend she follow up with Dr. Haroldine Laws as an outpatient to review this. I clarified with Dr. Bonner Puna that he did not need inpatient cardiology to see. I will send a message to CHF team to help get close f/u arranged.   Per d/w Dr. Sallyanne Kuster, OK to cancel 8/8 TEE since she had it done today. I spoke with endo who acknowledged this request.  Charlie Pitter, PA-C

## 2022-06-25 NOTE — Op Note (Signed)
INDICATIONS: Right to left atrial shunt  PROCEDURE:   Informed consent was obtained prior to the procedure. The risks, benefits and alternatives for the procedure were discussed and the patient comprehended these risks.  Risks include, but are not limited to, cough, sore throat, vomiting, nausea, somnolence, esophageal and stomach trauma or perforation, bleeding, low blood pressure, aspiration, pneumonia, infection, trauma to the teeth and death.    After a procedural time-out, the oropharynx was anesthetized with 20% benzocaine spray.   During this procedure the patient was administered IV propofol by Anesthesiology, Dr. Therisa Doyne.  The transesophageal probe was inserted in the esophagus and stomach without difficulty and multiple views were obtained.  The patient was kept under observation until the patient left the procedure room.  The patient left the procedure room in stable condition.   Agitated microbubble saline contrast was not administered.  COMPLICATIONS:    There were no immediate complications.  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 It is located at the anterosuperior edge of the fossa ovale There is mild enlargement of the right heart chambers, but normal right ventricular function. Pulmonary venous drainage appears to be normal, but the study was abbreviated due to patient safety concerns.  RECOMMENDATIONS:     Consider ASD device closure.  Time Spent Directly with the Patient:  30 minutes   [name] 06/25/2022, 11:03 AM

## 2022-06-25 NOTE — Progress Notes (Signed)
PT Cancellation Note  Patient Details Name: Teresa Wiley MRN: [id number] DOB: [birth year]   Cancelled Treatment:    Reason Eval/Treat Not Completed: (P) Other (comment) (Pt off unit for TEE this am, will f/u per POC.)   [name] Eli Hose 06/25/2022, 10:35 AM  Erasmo Leventhal , PTA Acute Rehabilitation Services Office [phone]

## 2022-06-25 NOTE — TOC Initial Note (Signed)
Transition of Care [facility]) - Initial/Assessment Note    Patient Details  Name: Teresa Wiley MRN: [id number] Date of Birth: 11/05/1956  [facility] CM/SW Contact:    Bethena Roys, RN Phone Number: 06/25/2022, 12:05 PM  Clinical Narrative:  Case Manager spoke with the patient concerning home health PT/OT recommendations. PTA patient states she was from home with her significant other. Case Manager discussed home health needs and patient declined PT/OT- MD and staff RN aware. Patient asked for DME rollator and shower chair- DME ordered via [employer]. DME to be delivered to the room prior to transition home.  No further home needs identified at this time.      Expected Discharge Plan: Home/Self Care Barriers to Discharge: Continued Medical Work up   Patient Goals and CMS Choice Patient states their goals for this hospitalization and ongoing recovery are:: to return home- declines [facility] Services.   Choice offered to / list presented to : NA  Expected Discharge Plan and Services Expected Discharge Plan: Home/Self Care In-house Referral: NA Discharge Planning Services: CM Consult Post Acute Care Choice: NA Living arrangements for the past 2 months: Single Family Home                 DME Arranged: Shower stool, Walker rolling with seat DME Agency: [employer] Date DME Agency Contacted: 06/25/22 Time DME Agency Contacted: 1205 Representative spoke with at DME Agency: [place]: NA          Prior Living Arrangements/Services Living arrangements for the past 2 months: [place] Lives with:: Significant Other Patient language and need for interpreter reviewed:: Yes Do you feel safe going back to the place where you live?: Yes      Need for Family Participation in Patient Care: Yes (Comment) Care giver support system in place?: Yes (comment) Current home services: DME Criminal Activity/Legal Involvement Pertinent to Current  Situation/Hospitalization: No - Comment as needed  Activities of Daily Living Home Assistive Devices/Equipment: CBG Meter, Oxygen, Eyeglasses ADL Screening (condition at time of admission) Patient's cognitive ability adequate to safely complete daily activities?: Yes Is the patient deaf or have difficulty hearing?: No Does the patient have difficulty seeing, even when wearing glasses/contacts?: No Does the patient have difficulty concentrating, remembering, or making decisions?: No Patient able to express need for assistance with ADLs?: Yes Does the patient have difficulty dressing or bathing?: No Independently performs ADLs?: Yes (appropriate for developmental age) Does the patient have difficulty walking or climbing stairs?: No Weakness of Legs: None Weakness of Arms/Hands: None  Permission Sought/Granted Permission sought to share information with : Facility Sport and exercise psychologist, Tourist information centre manager, Family Supports Permission granted to share information with : Yes, Verbal Permission Granted     Permission granted to share info w AGENCY: [employer]        Emotional Assessment Appearance:: Appears stated age Attitude/Demeanor/Rapport: Engaged Affect (typically observed): Appropriate Orientation: : Oriented to Self, Oriented to Place, Oriented to  Time, Oriented to Situation Alcohol / Substance Use: Not Applicable Psych Involvement: No (comment)  Admission diagnosis:  Syncope and collapse [R55] Patient Active Problem List   Diagnosis Date Noted   ASD (atrial septal defect)    Syncope and collapse 06/24/2022   Vomiting and diarrhea    Allergy to contrast media (used for diagnostic x-rays)    OSA (obstructive sleep apnea) 08/24/2021   PND (post-nasal drip) 06/07/2021   Chest pain 07/19/2020   Severe sepsis with acute organ dysfunction ([place]) 07/01/2020   Acute  on chronic diastolic CHF (congestive heart failure) ([place]) 02/09/2019   AKI (acute kidney injury) ([place]) 02/09/2019   PE  (pulmonary thromboembolism) ([place]) 02/09/2019   Asthma 02/09/2019   GERD (gastroesophageal reflux disease) 02/09/2019   Chronic respiratory failure with hypoxia ([place]) 02/09/2019   Encephalopathy, toxic 12/07/2018   Encephalopathy 12/06/2018   Recurrent syncope 11/17/2018   Polypharmacy 11/17/2018   Type 2 diabetes mellitus with vascular disease ([place]) 01/10/2016   ARF (acute renal failure) ([place]) 01/10/2016   Stroke (cerebrum) ([place]) 01/10/2016   Cerebral infarction due to unspecified mechanism    Narcotic dependence ([place]) 10/18/2014   Chronic pain syndrome 10/18/2014   Diabetes type 2, controlled ([place])    Pulmonary HTN ([place])    Community acquired pneumonia of right lower lobe of lung 10/17/2014   Syncope, vasovagal 07/19/2014   S/P cholecystectomy 07/19/2014   Abnormal EKG 07/19/2014   Sludge in gallbladder 03/09/2014   Steatohepatitis, nonalcoholic 19/62/2297   Tobacco abuse 03/09/2014   Right-sided chest wall pain 03/09/2014   Right flank pain 03/09/2014   Right lateral abdominal pain (RUQ & RLQ) 03/09/2014   Urinary incontinence in female 03/09/2014   Coccydynia 03/09/2014   Nocturnal hypoxemia due to obesity 03/09/2014   DM (diabetes mellitus) ([place]) 03/09/2014   HTN (hypertension) 03/09/2014   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 [place], [place] - [address] [place] [address] Phone: [phone] Fax: [phone]  [id number] - [place], [place] - [address] [place] [address] Phone: [phone] Fax: [phone]   Readmission Risk Interventions     No data to display

## 2022-06-25 NOTE — Care Management (Signed)
    Durable Medical Equipment  (From admission, onward)           Start     Ordered   06/25/22 1158  For home use only DME Shower stool  Once        06/25/22 1157   06/25/22 1157  For home use only DME 4 wheeled rolling walker with seat  Once       Question:  Patient needs a walker to treat with the following condition  Answer:  General weakness   06/25/22 1157

## 2022-06-25 NOTE — Progress Notes (Signed)
Patient given discharge instructions and stated understanding. 

## 2022-06-25 NOTE — Discharge Summary (Signed)
Physician Discharge Summary   Patient: Teresa Wiley MRN: [id number] DOB: 1956/08/10  Admit date:     06/23/2022  Discharge date: 06/25/22  Discharge Physician: Patrecia Pour   PCP: Penni Bombard, PA   Recommendations at discharge:  Follow up with Dr. Haroldine Laws regarding ASD with L > R shunting.  Follow up with general cardiology and consider cardiac monitoring if syncope is recurrent.  Discharge Diagnoses: Principal Problem:   Recurrent syncope Active Problems:   Vomiting and diarrhea   Class 2 obesity due to excess calories with body mass index (BMI) of 35.0 to 35.9 in adult   HTN (hypertension)   Chronic pain syndrome   Diabetes type 2, controlled ([place])   Pulmonary HTN (HCC)   Chronic respiratory failure with hypoxia (HCC)   Syncope and collapse   Allergy to contrast media (used for diagnostic x-rays)   ASD (atrial septal defect)   Hospital Course: Per H&P by Dr. Damita Dunnings: HPI: Teresa Wiley is a 66 y.o. female with medical history significant for Pulmonary hypertension on sildenafil, history of PE on Eliquis, diabetes COPD, chronic respiratory failure on home O2 at 3 L, HTN, history of PE, HFpEF, EF 55 to 60%, obesity, chronic pain on chronic narcotics with a history of recurrent syncope, last admitted for the same in July 2022 who presents to the ED with a syncopal episode while sitting on the commode having a bowel movement.  She was found slumped over by EMS with an O2 sat of 77 on home flow rate.  She recalled having multiple episodes of watery diarrhea followed by episodes of emesis and the last thing was being woken up slumped over.  On arrival to the ED she complained of pain on the left side of her abdomen and nausea as well as pain on the left arm and leg.  She denies falling up on that side or leaning against that side. ED course and data review: Vitals within normal limits and O2 sat 97% on home flow rate of 3 L Labs hemoglobin 10.9, D-dimer less than 0.7, troponin  2 and BNP 34.6, CMP mostly unremarkable, lipase 39 EKG, personally viewed and interpreted: Sinus at 69 with no acute ST-T wave changes CT angio chest abdomen and pelvis aorta showing the following IMPRESSION: 1. No aortic dissection or aneurysm. 2. Findings concerning for acute pancreatitis. Correlation with pancreatic enzymes recommended. 3. No bowel obstruction. Normal appendix   Hospitalist consulted for observation admit Of note patient was premedicated prior to her CT given a contrast allergy history.  Following his CT she cannot planes of an itchy throat and was administered an EpiPen shot.  She had no further complaints.   Problem-based hospital course: Recurrent syncope: Has been associated with bowel movements in the past and again now. No dysrhythmias on telemetry monitoring. Pt has no orthostatic symptoms - Cardiology not convinced that ASD is causative of these episodes.  - Return precautions reviewed.  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 TEE performed 8/1 by Dr. Sallyanne Kuster revealing the ASD. this was discussed at length with cardiology on 8/1 who have recommended outpatient follow up with Dr. Haroldine Laws to discuss next steps. They did not recommend urgent structural team evaluation.  - FINDINGS:  There is a small-to-medium secundum ASD with left to right shunt at rest. It is located at the anterosuperior edge of the fossa ovale There is mild enlargement of the right heart  chambers, but normal right ventricular function. Pulmonary venous drainage appears to be normal, but the study was abbreviated due to patient safety concerns.   RECOMMENDATIONS:      Consider ASD device closure.   Vomiting and diarrhea Possible acute gastroenteritis: Symptoms abated. Had normal endoscopy recently.  Allergy to contrast media: Patient was medicated but had some itching of her throat after the  contrasted CT scan requiring EpiPen. Given steroids while admitted and symptoms have durably resolved.  Chronic respiratory failure with hypoxia (HCC) Continue home oxygen.  O2 sats in the high 90s on home O2 at baseline flow rate  Pulmonary HTN ([place]): Given ASD ?if also related to L > R shunting.  Continue sildenafil  Diabetes type 2, controlled (HCC) Sliding scale insulin coverage  Chronic pain syndrome Judicious narcotic use  HTN (hypertension) Continue carvedilol  Class 2 obesity due to excess calories with body mass index (BMI) of 35.0 to 76.1 in adult Complicating factor to overall prognosis and care  Pancreatic inflammation by CT: Lipase not elevated when checked serially, pt tolerating a diet with minimal discomfort. No further evaluation is currently planned. Return precautions reviewed.   Consultants: Cardiology Procedures performed: TEE  Disposition: Home Diet recommendation:  Discharge Diet Orders (From admission, onward)     Start     Ordered   06/25/22 0000  Diet - low sodium heart healthy        06/25/22 1427           Cardiac diet DISCHARGE MEDICATION: Allergies as of 06/25/2022       Reactions   Buprenorphine Hcl Hives   Pt immediately c/o itching and hives after administration of '4mg'$  Morphine IV, required Benadryl IV for relief.   Erythromycin Anaphylaxis, Nausea And Vomiting   Iodine Hives, Other (See Comments)   Patient reports not having anaphylaxis to iodine. That was entered in mistake previously. REACTION: Hives, throat closes   Morphine And Related Hives   Pt immediately c/o itching and hives after administration of '4mg'$  Morphine IV, required Benadryl IV for relief.   Shellfish Allergy Anaphylaxis, Hives   Shellfish-derived Products Hives   Other Rash   Electrodes stickers        Medication List     STOP taking these medications    esomeprazole 40 MG capsule Commonly known as: NEXIUM   lisinopril 2.5 MG tablet Commonly known as:  ZESTRIL       TAKE these medications    ALPRAZolam 1 MG tablet Commonly known as: XANAX Take 1 mg by mouth 2 (two) times daily as needed for anxiety.   carvedilol 12.5 MG tablet Commonly known as: COREG Take 12.5 mg by mouth 2 (two) times daily with a meal.   diphenhydrAMINE 25 MG tablet Commonly known as: BENADRYL Take 100 mg by mouth at bedtime as needed for sleep.   furosemide 40 MG tablet Commonly known as: LASIX Take 40 mg by mouth daily.   gabapentin 300 MG capsule Commonly known as: NEURONTIN Take 300 mg by mouth 3 (three) times daily.   oxyCODONE-acetaminophen 10-325 MG tablet Commonly known as: PERCOCET Take 1 tablet by mouth 4 (four) times daily.   OXYGEN Inhale 2.5 L into the lungs continuous.   Ozempic (1 MG/DOSE) 4 MG/3ML Sopn Generic drug: Semaglutide (1 MG/DOSE) Inject 1 mg into the skin every Thursday.   pantoprazole 40 MG tablet Commonly known as: PROTONIX Take 40 mg by mouth daily.   sildenafil 20 MG tablet Commonly known as: REVATIO Take 1  tablet (20 mg total) by mouth 3 (three) times daily. What changed: when to take this   Vitamin D (Ergocalciferol) 1.25 MG (50000 UNIT) Caps capsule Commonly known as: DRISDOL Take 50,000 Units by mouth every Wednesday.   Voltaren Arthritis Pain 1 % Gel Generic drug: diclofenac Sodium Apply 2 g topically 2 (two) times daily as needed (pain).               Durable Medical Equipment  (From admission, onward)           Start     Ordered   06/25/22 1158  For home use only DME Shower stool  Once        06/25/22 1157   06/25/22 1157  For home use only DME 4 wheeled rolling walker with seat  Once       Question:  Patient needs a walker to treat with the following condition  Answer:  General weakness   06/25/22 [place] Follow up.   Why: Showe chair and rolling walker with the seat will be delivered to the room prior to  discharge. Contact information: [address] [place] [address] [phone]         Penni Bombard, PA Follow up.   Specialty: Physician Assistant Contact information: [address] [place] [address] [phone]         Bensimhon, Shaune Pascal, MD Follow up.   Specialty: Cardiology Contact information: [address] [place] [address] [phone]                Discharge Exam: Danley Danker Weights   06/23/22 2040 06/24/22 1946  Weight: 114.3 kg 114.4 kg  BP 138/73 (BP Location: Left Arm)   Pulse 82   Temp 98 F (36.7 C) (Oral)   Resp 18   Ht '5\' 10"'$  (1.778 m)   Wt 114.4 kg   SpO2 95%   BMI 36.19 kg/m   No distress Clear, nonlabored  RRR, no edema  Condition at discharge: stable  The results of significant diagnostics from this hospitalization (including imaging, microbiology, ancillary and laboratory) are listed below for reference.   Imaging Studies: ECHO TEE  Result Date: 06/25/2022    TRANSESOPHOGEAL ECHO REPORT   Patient Name:   Teresa Wiley Date of Exam: 06/25/2022 Medical Rec #:  [id number]        Height:       70.0 in Accession #:    [id number]       Weight:       252.2 lb Date of Birth:  1956-01-26         BSA:          2.304 m Patient Age:    63 years         BP:           133/63 mmHg Patient Gender: F                HR:           76 bpm. Exam Location:  Inpatient Procedure: Transesophageal Echo, Cardiac Doppler, Color Doppler and 3D Echo Indications:     PFO  History:         Patient has prior history of Echocardiogram examinations, most                  recent 03/07/2022. Pulmonary  HTN; Risk Factors:Hypertension,                  Diabetes and Sleep Apnea.  Sonographer:     Darlina Sicilian RDCS Referring Phys:  [id number] Tami Lin [facility] Diagnosing Phys: Sanda Klein MD PROCEDURE: After discussion of the risks and benefits of a TEE, an informed consent was obtained from the patient. TEE procedure time was 5 minutes. The  transesophogeal probe was passed without difficulty through the esophogus of the patient. Imaged were  obtained with the patient in a left lateral decubitus position. Sedation performed by different physician. The patient was monitored while under deep sedation. Anesthestetic sedation was provided intravenously by Anesthesiology: 177.'32mg'$  of Propofol. Image quality was good. The patient developed Respiratory depression during the procedure. IMPRESSIONS  1. Left ventricular ejection fraction, by estimation, is 65 to 70%. The left ventricle has normal function. The left ventricle has no regional wall motion abnormalities.  2. Right ventricular systolic function is normal. The right ventricular size is mildly enlarged.  3. Left atrial size was mildly dilated. No left atrial/left atrial appendage thrombus was detected.  4. The mitral valve is normal in structure. No evidence of mitral valve regurgitation. No evidence of mitral stenosis.  5. The aortic valve is normal in structure. Aortic valve regurgitation is not visualized. No aortic stenosis is present.  6. The inferior vena cava is normal in size with greater than 50% respiratory variability, suggesting right atrial pressure of 3 mmHg.  7. Evidence of atrial level shunting detected by color flow Doppler. There is a small secundum atrial septal defect with predominantly left to right shunting across the atrial septum. Qp:Qs shunt fraction is 1.4. Maximum defect diameter is 0.7 cm, 3D guided planimetry area is 2.1 cm sq. The right and left upper pulmonary veins drain normally to the left atrium. The study was abbreviated due to patient safety related issues. The other pulmonary veins were not identified, but appeared normal by chest CT. FINDINGS  Left Ventricle: Left ventricular ejection fraction, by estimation, is 65 to 70%. The left ventricle has normal function. The left ventricle has no regional wall motion abnormalities. The left ventricular internal cavity size  was normal in size. There is  no left ventricular hypertrophy. The ratio of pulmonic flow to systemic flow (Qp/Qs ratio) is 1.40. Right Ventricle: The right ventricular size is mildly enlarged. No increase in right ventricular wall thickness. Right ventricular systolic function is normal. Left Atrium: Left atrial size was mildly dilated. No left atrial/left atrial appendage thrombus was detected. Right Atrium: Right atrial size was normal in size. Pericardium: There is no evidence of pericardial effusion. Mitral Valve: The mitral valve is normal in structure. No evidence of mitral valve regurgitation. No evidence of mitral valve stenosis. Tricuspid Valve: The tricuspid valve is normal in structure. Tricuspid valve regurgitation is mild . No evidence of tricuspid stenosis. Aortic Valve: The aortic valve is normal in structure. Aortic valve regurgitation is not visualized. No aortic stenosis is present. Pulmonic Valve: The pulmonic valve was normal in structure. Pulmonic valve regurgitation is not visualized. No evidence of pulmonic stenosis. Aorta: The aortic root and ascending aorta are structurally normal, with no evidence of dilitation. Venous: The inferior vena cava is normal in size with greater than 50% respiratory variability, suggesting right atrial pressure of 3 mmHg. IAS/Shunts: Evidence of atrial level shunting detected by color flow Doppler. There is a small secundum atrial septal defect with predominantly left to right shunting across the atrial septum.  The ratio of pulmonic flow to systemic flow (Qp/Qs ratio) is 1.40.  LEFT VENTRICLE PLAX 2D LVOT diam:     2.05 cm LV SV:         84 LV SV Index:   37 LVOT Area:     3.29 cm  RIGHT VENTRICLE RV Basal diam:  4.50 cm RVOT diam:      2.95 cm AORTIC VALVE             PULMONIC VALVE LVOT Vmax:   131.07 cm/s RVOT Peak grad: 2 mmHg LVOT Vmean:  83.546 cm/s LVOT VTI:    0.256 m  SHUNTS Systemic VTI:  0.26 m Systemic Diam: 2.05 cm Pulmonic VTI:  0.175 m Pulmonic  Diam: 2.95 cm Qp/Qs:         1.42 Dani Gobble Croitoru MD Electronically signed by Sanda Klein MD Signature Date/Time: 06/25/2022/12:59:53 PM    Final    CT Angio Chest/Abd/Pel for Dissection W and/or W/WO  Result Date: 06/24/2022 CLINICAL DATA:  Chest and back pain.  Concern for aortic dissection. EXAM: CT ANGIOGRAPHY CHEST, ABDOMEN AND PELVIS TECHNIQUE: Non-contrast CT of the chest was initially obtained. Multidetector CT imaging through the chest, abdomen and pelvis was performed using the standard protocol during bolus administration of intravenous contrast. Multiplanar reconstructed images and MIPs were obtained and reviewed to evaluate the vascular anatomy. RADIATION DOSE REDUCTION: This exam was performed according to the departmental dose-optimization program which includes automated exposure control, adjustment of the mA and/or kV according to patient size and/or use of iterative reconstruction technique. CONTRAST:  57m OMNIPAQUE IOHEXOL 350 MG/ML SOLN COMPARISON:  CT of the chest abdomen pelvis dated 12/13/2021. FINDINGS: CTA CHEST FINDINGS Cardiovascular: There is no cardiomegaly or pericardial effusion. The thoracic aorta is unremarkable. The origins of the great vessels of the aortic arch appear patent. No pulmonary artery embolus identified. Mediastinum/Nodes: No hilar or mediastinal adenopathy. Small hiatal hernia. The esophagus and the thyroid gland are grossly unremarkable. No mediastinal fluid collection. Lungs/Pleura: Bilateral linear atelectasis/scarring. No new consolidation. There is no pleural effusion or pneumothorax. The central airways are patent. Musculoskeletal: Probable T11 and T12 hemangioma. No acute osseous pathology. Review of the MIP images confirms the above findings. CTA ABDOMEN AND PELVIS FINDINGS VASCULAR Aorta: Normal caliber aorta without aneurysm, dissection, vasculitis or significant stenosis. Celiac: Patent without evidence of aneurysm, dissection, vasculitis or significant  stenosis. SMA: Patent without evidence of aneurysm, dissection, vasculitis or significant stenosis. Renals: Both renal arteries are patent without evidence of aneurysm, dissection, vasculitis, fibromuscular dysplasia or significant stenosis. IMA: Patent without evidence of aneurysm, dissection, vasculitis or significant stenosis. Inflow: Patent without evidence of aneurysm, dissection, vasculitis or significant stenosis. Veins: No obvious venous abnormality within the limitations of this arterial phase study. Review of the MIP images confirms the above findings. NON-VASCULAR No intra-abdominal free air or free fluid. Hepatobiliary: The liver is unremarkable. Mild dilatation of the common bile duct, likely post cholecystectomy. No retained calcified stone noted in the central CBD. Pancreas: There is mild stranding and haziness of the peripancreatic and upper mesentery fat. Correlation with pancreatic enzymes recommended to evaluate for acute pancreatitis. No drainable fluid collection/abscess or pseudocyst. Spleen: Normal in size without focal abnormality. Adrenals/Urinary Tract: The adrenal glands unremarkable. The kidneys, visualized ureters, and urinary bladder appear unremarkable. Stomach/Bowel: Mild diffuse thickened appearance of the colon likely related to underdistention. Mild colitis is less likely. There is no bowel obstruction. The appendix is normal. Lymphatic: No adenopathy. Reproductive: The uterus is anteverted. There is  a 4 cm posterior uterine fibroid. No adnexal masses. Other: None Musculoskeletal: Degenerative changes at L5-S1 with disc desiccation and grade 1 retrolisthesis. No acute osseous pathology. Review of the MIP images confirms the above findings. IMPRESSION: 1. No aortic dissection or aneurysm. 2. Findings concerning for acute pancreatitis. Correlation with pancreatic enzymes recommended. 3. No bowel obstruction. Normal appendix. Electronically Signed   By: Anner Crete M.D.   On:  06/24/2022 02:40   DG Chest 2 View  Result Date: 06/23/2022 CLINICAL DATA:  Chest pain and shortness of breath. EXAM: CHEST - 2 VIEW COMPARISON:  12/12/2021 FINDINGS: Cardiac enlargement with pulmonary vascular congestion. Perihilar and basilar and interstitial infiltrates, likely edema. Small bilateral pleural effusions. No pneumothorax. Calcification of the aorta. IMPRESSION: Cardiac enlargement with pulmonary vascular congestion, perihilar edema, and bilateral pleural effusions. Electronically Signed   By: Lucienne Capers M.D.   On: 06/23/2022 22:37    Microbiology: Results for orders placed or performed during the hospital encounter of 12/12/21  Resp Panel by RT-PCR (Flu A&B, Covid) Nasopharyngeal Swab     Status: None   Collection Time: 12/12/21  8:14 PM   Specimen: Nasopharyngeal Swab; Nasopharyngeal(NP) swabs in vial transport medium  Result Value Ref Range Status   SARS Coronavirus 2 by RT PCR NEGATIVE NEGATIVE Final    Comment: (NOTE) SARS-CoV-2 target nucleic acids are NOT DETECTED.  The SARS-CoV-2 RNA is generally detectable in upper respiratory specimens during the acute phase of infection. The lowest concentration of SARS-CoV-2 viral copies this assay can detect is 138 copies/mL. A negative result does not preclude SARS-Cov-2 infection and should not be used as the sole basis for treatment or other patient management decisions. A negative result may occur with  improper specimen collection/handling, submission of specimen other than nasopharyngeal swab, presence of viral mutation(s) within the areas targeted by this assay, and inadequate number of viral copies(<138 copies/mL). A negative result must be combined with clinical observations, patient history, and epidemiological information. The expected result is Negative.  Fact Sheet for Patients:  EntrepreneurPulse.com.au  Fact Sheet for Healthcare Providers:   IncredibleEmployment.be  This test is no t yet approved or cleared by the [place] FDA and  has been authorized for detection and/or diagnosis of SARS-CoV-2 by FDA under an Emergency Use Authorization (EUA). This EUA will remain  in effect (meaning this test can be used) for the duration of the COVID-19 declaration under Section 564(b)(1) of the Act, 21 U.S.C.section 360bbb-3(b)(1), unless the authorization is terminated  or revoked sooner.       Influenza A by PCR NEGATIVE NEGATIVE Final   Influenza B by PCR NEGATIVE NEGATIVE Final    Comment: (NOTE) The Xpert Xpress SARS-CoV-2/FLU/RSV plus assay is intended as an aid in the diagnosis of influenza from Nasopharyngeal swab specimens and should not be used as a sole basis for treatment. Nasal washings and aspirates are unacceptable for Xpert Xpress SARS-CoV-2/FLU/RSV testing.  Fact Sheet for Patients: EntrepreneurPulse.com.au  Fact Sheet for Healthcare Providers: IncredibleEmployment.be  This test is not yet approved or cleared by the [place] FDA and has been authorized for detection and/or diagnosis of SARS-CoV-2 by FDA under an Emergency Use Authorization (EUA). This EUA will remain in effect (meaning this test can be used) for the duration of the COVID-19 declaration under Section 564(b)(1) of the Act, 21 U.S.C. section 360bbb-3(b)(1), unless the authorization is terminated or revoked.  Performed at [facility], [place] [address] [place], [place] [address]     Labs:  CBC: Recent Labs  Lab 06/23/22 2045 06/24/22 0444 06/25/22 0431  WBC 4.4 6.5 6.8  NEUTROABS 1.8  --   --   HGB 10.9* 8.4* 10.0*  HCT 38.7 28.6* 34.3*  MCV 85.4 84.4 82.7  PLT 379 441* 094   Basic Metabolic Panel: Recent Labs  Lab 06/23/22 2045 06/24/22 0444 06/25/22 0431  NA 138  --  140  K 4.2  --  3.8  CL 99  --  104  CO2 31  --  30  GLUCOSE 126*  --  107*  BUN 10   --  12  CREATININE 1.01* 0.92 0.92  CALCIUM 9.2  --  9.1   Liver Function Tests: Recent Labs  Lab 06/23/22 2045  AST 14*  ALT 10  ALKPHOS 92  BILITOT 0.4  PROT 7.3  ALBUMIN 3.4*   CBG: Recent Labs  Lab 06/24/22 1158 06/24/22 1700 06/24/22 2203 06/25/22 0811 06/25/22 1111  GLUCAP 153* 135* 125* 112* 141*    Discharge time spent: greater than 30 minutes.  Signed: Patrecia Pour, MD [facility] 06/25/2022

## 2022-06-27 ENCOUNTER — Telehealth (HOSPITAL_COMMUNITY): Payer: Self-pay | Admitting: Vascular Surgery

## 2022-06-27 ENCOUNTER — Telehealth: Payer: Self-pay

## 2022-06-27 ENCOUNTER — Encounter (HOSPITAL_COMMUNITY): Payer: Self-pay | Admitting: Cardiovascular Disease

## 2022-06-27 NOTE — Telephone Encounter (Signed)
Lvm giving pt post TEE f/u

## 2022-06-27 NOTE — Telephone Encounter (Signed)
F/u appt made Scheduler to contact patient with appt details

## 2022-06-27 NOTE — Telephone Encounter (Signed)
Per Dr. Haroldine Laws, called to arrange ASD consult with Dr. Burt Knack.  Left message to call back.

## 2022-06-28 NOTE — Telephone Encounter (Signed)
The patient called back, confirmed identity, then said she'd call back and hung up.

## 2022-07-02 ENCOUNTER — Encounter (HOSPITAL_COMMUNITY): Payer: Self-pay

## 2022-07-02 ENCOUNTER — Ambulatory Visit (HOSPITAL_COMMUNITY): Admit: 2022-07-02 | Payer: Medicare Other | Admitting: Internal Medicine

## 2022-07-02 SURGERY — ECHOCARDIOGRAM, TRANSESOPHAGEAL
Anesthesia: Moderate Sedation

## 2022-07-08 ENCOUNTER — Telehealth (HOSPITAL_COMMUNITY): Payer: Self-pay

## 2022-07-08 NOTE — Telephone Encounter (Signed)
Called and left patient a voice message to confirm/remind patient of their appointment at the [facility] on 07/09/22.

## 2022-07-09 ENCOUNTER — Encounter (HOSPITAL_COMMUNITY): Payer: Medicare Other

## 2022-07-09 ENCOUNTER — Encounter (HOSPITAL_COMMUNITY): Payer: Self-pay

## 2022-07-09 ENCOUNTER — Ambulatory Visit (HOSPITAL_COMMUNITY)
Admission: RE | Admit: 2022-07-09 | Discharge: 2022-07-09 | Disposition: A | Discharge status: Home / Self Care | Payer: Medicare Other | Source: Ambulatory Visit | Attending: Family Medicine | Admitting: Family Medicine

## 2022-07-09 VITALS — BP 102/70 | HR 93 | Wt 259.0 lb

## 2022-07-09 DIAGNOSIS — Q211 Atrial septal defect, unspecified: Secondary | ICD-10-CM | POA: Diagnosis not present

## 2022-07-09 DIAGNOSIS — Z87891 Personal history of nicotine dependence: Secondary | ICD-10-CM | POA: Diagnosis not present

## 2022-07-09 DIAGNOSIS — G894 Chronic pain syndrome: Secondary | ICD-10-CM | POA: Insufficient documentation

## 2022-07-09 DIAGNOSIS — I10 Essential (primary) hypertension: Secondary | ICD-10-CM | POA: Diagnosis not present

## 2022-07-09 DIAGNOSIS — E119 Type 2 diabetes mellitus without complications: Secondary | ICD-10-CM | POA: Diagnosis not present

## 2022-07-09 DIAGNOSIS — Z6837 Body mass index (BMI) 37.0-37.9, adult: Secondary | ICD-10-CM | POA: Diagnosis not present

## 2022-07-09 DIAGNOSIS — Z9981 Dependence on supplemental oxygen: Secondary | ICD-10-CM | POA: Insufficient documentation

## 2022-07-09 DIAGNOSIS — R0789 Other chest pain: Secondary | ICD-10-CM

## 2022-07-09 DIAGNOSIS — J9611 Chronic respiratory failure with hypoxia: Secondary | ICD-10-CM

## 2022-07-09 DIAGNOSIS — I110 Hypertensive heart disease with heart failure: Secondary | ICD-10-CM | POA: Diagnosis not present

## 2022-07-09 DIAGNOSIS — I272 Pulmonary hypertension, unspecified: Secondary | ICD-10-CM | POA: Diagnosis present

## 2022-07-09 DIAGNOSIS — Z86711 Personal history of pulmonary embolism: Secondary | ICD-10-CM | POA: Diagnosis not present

## 2022-07-09 DIAGNOSIS — G4733 Obstructive sleep apnea (adult) (pediatric): Secondary | ICD-10-CM | POA: Insufficient documentation

## 2022-07-09 DIAGNOSIS — Q2111 Secundum atrial septal defect: Secondary | ICD-10-CM | POA: Insufficient documentation
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
## 2022-07-09 NOTE — Patient Instructions (Signed)
Thank you for coming in today  No labs today   No medication changes   You have been scheduled for Dr. Burt Knack   Your physician recommends that you schedule a follow-up appointment in:  3 months with Dr. Haroldine Laws    Do the following things EVERYDAY: Weigh yourself in the morning before breakfast. Write it down and keep it in a log. Take your medicines as prescribed Eat low salt foods--Limit salt (sodium) to 2000 mg per day.  Stay as active as you can everyday Limit all fluids for the day to less than 2 liters  At the [facility], you and your health needs are our priority. As part of our continuing mission to provide you with exceptional heart care, we have created designated Provider Care Teams. These Care Teams include your primary Cardiologist (physician) and Advanced Practice Providers (APPs- Physician Assistants and Nurse Practitioners) who all work together to provide you with the care you need, when you need it.   You may see any of the following providers on your designated Care Team at your next follow up: Dr Glori Bickers Dr Haynes Kerns, NP Lyda Jester, [place] [facility] [place], [place] Audry Riles, PharmD   Please be sure to bring in all your medications bottles to every appointment.   If you have any questions or concerns before your next appointment please send [place] a message through [place] or call our office at [phone].    TO LEAVE A MESSAGE FOR THE NURSE SELECT OPTION 2, PLEASE LEAVE A MESSAGE INCLUDING: YOUR NAME DATE OF BIRTH CALL BACK NUMBER REASON FOR CALL**this is important as we prioritize the call backs  YOU WILL RECEIVE A CALL BACK THE SAME DAY AS LONG AS YOU CALL BEFORE 4:00 PM

## 2022-07-10 NOTE — Telephone Encounter (Signed)
The patient had a CHF Clinic visit yesterday. Scheduled her for ASD consult with Dr. Burt Knack 8/29.

## 2022-07-23 ENCOUNTER — Ambulatory Visit: Payer: Medicare Other | Admitting: Cardiovascular Disease

## 2022-09-02 ENCOUNTER — Ambulatory Visit: Payer: Medicare Other | Attending: Cardiovascular Disease | Admitting: Cardiovascular Disease

## 2022-09-02 ENCOUNTER — Encounter: Payer: Self-pay | Admitting: Cardiovascular Disease

## 2022-09-02 VITALS — BP 118/70 | HR 88 | Ht 69.0 in | Wt 262.6 lb

## 2022-09-02 DIAGNOSIS — Z01810 Encounter for preprocedural cardiovascular examination: Secondary | ICD-10-CM | POA: Diagnosis present

## 2022-09-02 DIAGNOSIS — Q211 Atrial septal defect, unspecified: Secondary | ICD-10-CM | POA: Insufficient documentation

## 2022-09-02 LAB — BASIC METABOLIC PANEL
BUN/Creatinine Ratio: 12 (ref 12–28)
BUN: 12 mg/dL (ref 8–27)
CO2: 29 mmol/L (ref 20–29)
Calcium: 9.4 mg/dL (ref 8.7–10.3)
Chloride: 99 mmol/L (ref 96–106)
Creatinine, Ser: 1.04 mg/dL — ABNORMAL HIGH (ref 0.57–1.00)
Glucose: 101 mg/dL — ABNORMAL HIGH (ref 70–99)
Potassium: 4.6 mmol/L (ref 3.5–5.2)
Sodium: 139 mmol/L (ref 134–144)
eGFR: 60 mL/min/{1.73_m2} (ref >59)

## 2022-09-02 LAB — CBC
Hematocrit: 34.9 % (ref 34.0–46.6)
Hemoglobin: 10.7 g/dL — ABNORMAL LOW (ref 11.1–15.9)
MCH: 24.7 pg — ABNORMAL LOW (ref 26.6–33.0)
MCHC: 30.7 g/dL — ABNORMAL LOW (ref 31.5–35.7)
MCV: 81 fL (ref 79–97)
Platelets: 306 10*3/uL (ref 150–450)
RBC: 4.33 x10E6/uL (ref 3.77–5.28)
RDW: 16.5 % — ABNORMAL HIGH (ref 11.7–15.4)
WBC: 6.1 10*3/uL (ref 3.4–10.8)

## 2022-09-02 MED ORDER — CLOPIDOGREL BISULFATE 75 MG PO TABS: ORAL_TABLET | ORAL | 1 refills | Status: DC

## 2022-09-02 MED ORDER — ASPIRIN 81 MG PO TBEC: 81.0000 mg | DELAYED_RELEASE_TABLET | Freq: Every day | ORAL | 3 refills | Status: DC
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
## 2022-09-02 NOTE — Patient Instructions (Signed)
Medication Instructions:  START Clopidogrel '75mg'$  daily five days prior to procedure *If you need a refill on your cardiac medications before your next appointment, please call your pharmacy*   Lab Work: CBC, BMET today If you have labs (blood work) drawn today and your tests are completely normal, you will receive your results only by: [place] (if you have MyChart) OR A paper copy in the mail If you have any lab test that is abnormal or we need to change your treatment, we will call you to review the results.   Testing/Procedures: ASD closure (09/12/22)   Follow-Up: At [facility], you and your health needs are our priority.  As part of our continuing mission to provide you with exceptional heart care, we have created designated Provider Care Teams.  These Care Teams include your primary Cardiologist (physician) and Advanced Practice Providers (APPs -  Physician Assistants and Nurse Practitioners) who all work together to provide you with the care you need, when you need it.  Your next appointment:   Structural Team will follow-up  The format for your next appointment:   In Person  Provider:   Sherren Mocha    Other Instructions See separate instructions for closure details  Important Information About Sugar

## 2022-09-09 ENCOUNTER — Telehealth: Payer: Self-pay | Admitting: *Deleted

## 2022-09-09 NOTE — Telephone Encounter (Signed)
Reviewed procedure instructions with patient.  

## 2022-09-09 NOTE — Telephone Encounter (Signed)
ASD closure scheduled at [facility] for: Thursday September 12, 2022 9:30 AM Arrival time and place: [place] Entrance A at: 7:30 AM  Nothing to eat after midnight prior to procedure, clear liquids until 5 AM day of procedure.  Medication instructions: -Hold:  Ozempic-weekly on Wednesdays (will hold 09/11/22)  Lasix-AM of procedure Except hold medications usual  morning medications can be taken with sips of water including aspirin 81 mg and Plavix 75 mg. Patient reports she started Plavix and aspirin 09/07/22.  Confirmed patient has responsible adult to drive home post procedure and be with patient first 24 hours after arriving home.  Patient reports no new symptoms concerning for COVID-19 in the past 10 days.  Reviewed procedure instructions with patient.

## 2022-09-09 NOTE — Telephone Encounter (Signed)
Pt returning call. Transferred to Desiree Lucy, RN

## 2022-09-11 ENCOUNTER — Telehealth: Payer: Self-pay | Admitting: Internal Medicine

## 2022-09-11 NOTE — Telephone Encounter (Signed)
Nope I have not gotten anything

## 2022-09-11 NOTE — Telephone Encounter (Signed)
Attempted to call pt but unable to reach. Left message for her to return call. 

## 2022-09-11 NOTE — Telephone Encounter (Signed)
Teresa Wiley, please advise if you have received a CMN on pt.

## 2022-09-12 ENCOUNTER — Ambulatory Visit (HOSPITAL_COMMUNITY)
Admission: RE | Admit: 2022-09-12 | Discharge: 2022-09-13 | Disposition: A | Discharge status: Home / Self Care | Payer: Medicare Other | Attending: Cardiovascular Disease | Admitting: Cardiovascular Disease

## 2022-09-12 ENCOUNTER — Encounter (HOSPITAL_COMMUNITY)
Admission: RE | Disposition: A | Discharge status: Home / Self Care | Payer: Self-pay | Source: Home / Self Care | Attending: Cardiovascular Disease

## 2022-09-12 ENCOUNTER — Other Ambulatory Visit: Payer: Self-pay

## 2022-09-12 DIAGNOSIS — Z8774 Personal history of (corrected) congenital malformations of heart and circulatory system: Secondary | ICD-10-CM

## 2022-09-12 DIAGNOSIS — I10 Essential (primary) hypertension: Secondary | ICD-10-CM | POA: Diagnosis present

## 2022-09-12 DIAGNOSIS — E66812 Obesity, class 2: Secondary | ICD-10-CM

## 2022-09-12 DIAGNOSIS — E119 Type 2 diabetes mellitus without complications: Secondary | ICD-10-CM | POA: Diagnosis not present

## 2022-09-12 DIAGNOSIS — Z9981 Dependence on supplemental oxygen: Secondary | ICD-10-CM | POA: Insufficient documentation

## 2022-09-12 DIAGNOSIS — J9610 Chronic respiratory failure, unspecified whether with hypoxia or hypercapnia: Secondary | ICD-10-CM | POA: Insufficient documentation

## 2022-09-12 DIAGNOSIS — Z87891 Personal history of nicotine dependence: Secondary | ICD-10-CM | POA: Diagnosis not present

## 2022-09-12 DIAGNOSIS — Z6838 Body mass index (BMI) 38.0-38.9, adult: Secondary | ICD-10-CM | POA: Diagnosis not present

## 2022-09-12 DIAGNOSIS — Q211 Atrial septal defect, unspecified: Secondary | ICD-10-CM

## 2022-09-12 DIAGNOSIS — G894 Chronic pain syndrome: Secondary | ICD-10-CM | POA: Diagnosis not present

## 2022-09-12 DIAGNOSIS — I272 Pulmonary hypertension, unspecified: Secondary | ICD-10-CM | POA: Diagnosis present

## 2022-09-12 DIAGNOSIS — E6609 Other obesity due to excess calories: Secondary | ICD-10-CM

## 2022-09-12 HISTORY — PX: ATRIAL SEPTAL DEFECT(ASD) CLOSURE: CATH118299

## 2022-09-12 HISTORY — PX: RIGHT HEART CATH: CATH118263

## 2022-09-12 LAB — POCT I-STAT EG7
Acid-Base Excess: 2 mmol/L (ref 0.0–2.0)
Bicarbonate: 30.5 mmol/L — ABNORMAL HIGH (ref 20.0–28.0)
Calcium, Ion: 1.26 mmol/L (ref 1.15–1.40)
HCT: 30 % — ABNORMAL LOW (ref 36.0–46.0)
Hemoglobin: 10.2 g/dL — ABNORMAL LOW (ref 12.0–15.0)
O2 Saturation: 67 %
Potassium: 3.9 mmol/L (ref 3.5–5.1)
Sodium: 140 mmol/L (ref 135–145)
TCO2: 32 mmol/L (ref 22–32)
pCO2, Ven: 65.8 mmHg — ABNORMAL HIGH (ref 44–60)
pH, Ven: 7.273 (ref 7.25–7.43)
pO2, Ven: 41 mmHg (ref 32–45)

## 2022-09-12 LAB — GLUCOSE, CAPILLARY
Glucose-Capillary: 98 mg/dL (ref 70–99)
Glucose-Capillary: 96 mg/dL (ref 70–99)
Glucose-Capillary: 73 mg/dL (ref 70–99)

## 2022-09-12 LAB — POCT ACTIVATED CLOTTING TIME
Activated Clotting Time: 239 seconds
Activated Clotting Time: 251 seconds

## 2022-09-12 SURGERY — ATRIAL SEPTAL DEFECT (ASD) CLOSURE
Anesthesia: LOCAL

## 2022-09-12 MED ORDER — SODIUM CHLORIDE 0.9% FLUSH
3.0000 mL | Freq: Two times a day (BID) | INTRAVENOUS | Status: DC
  Administered 2022-09-12 (×2): 3 mL via INTRAVENOUS

## 2022-09-12 MED ORDER — CARVEDILOL 12.5 MG PO TABS
12.5000 mg | ORAL_TABLET | Freq: Two times a day (BID) | ORAL | Status: DC
  Administered 2022-09-12 – 2022-09-13 (×2): 12.5 mg via ORAL
  Filled 2022-09-12 (×2): qty 1

## 2022-09-12 MED ORDER — ORAL CARE MOUTH RINSE: 15.0000 mL | OROMUCOSAL | Status: DC | PRN

## 2022-09-12 MED ORDER — ONDANSETRON HCL 4 MG/2ML IJ SOLN: 4.0000 mg | Freq: Four times a day (QID) | INTRAMUSCULAR | Status: DC | PRN

## 2022-09-12 MED ORDER — FENTANYL CITRATE (PF) 100 MCG/2ML IJ SOLN
INTRAMUSCULAR | Status: DC | PRN
  Administered 2022-09-12: 50 ug via INTRAVENOUS
  Administered 2022-09-12 (×3): 25 ug via INTRAVENOUS

## 2022-09-12 MED ORDER — CLOPIDOGREL BISULFATE 75 MG PO TABS
75.0000 mg | ORAL_TABLET | Freq: Every day | ORAL | Status: DC
  Administered 2022-09-13: 75 mg via ORAL
  Filled 2022-09-12: qty 1

## 2022-09-12 MED ORDER — OXYCODONE-ACETAMINOPHEN 10-325 MG PO TABS: 1.0000 | ORAL_TABLET | Freq: Four times a day (QID) | ORAL | Status: DC

## 2022-09-12 MED ORDER — OXYCODONE-ACETAMINOPHEN 5-325 MG PO TABS
ORAL_TABLET | ORAL | Status: AC
  Filled 2022-09-12: qty 1

## 2022-09-12 MED ORDER — LIDOCAINE HCL (PF) 1 % IJ SOLN
INTRAMUSCULAR | Status: DC | PRN
  Administered 2022-09-12: 15 mL

## 2022-09-12 MED ORDER — SODIUM CHLORIDE 0.9 % IV SOLN: 250.0000 mL | INTRAVENOUS | Status: DC | PRN

## 2022-09-12 MED ORDER — ALPRAZOLAM 0.5 MG PO TABS
1.0000 mg | ORAL_TABLET | Freq: Two times a day (BID) | ORAL | Status: DC | PRN
  Administered 2022-09-12 – 2022-09-13 (×2): 1 mg via ORAL
  Filled 2022-09-12 (×2): qty 2

## 2022-09-12 MED ORDER — LIDOCAINE HCL (PF) 1 % IJ SOLN
INTRAMUSCULAR | Status: AC
  Filled 2022-09-12: qty 30

## 2022-09-12 MED ORDER — DIPHENHYDRAMINE HCL 25 MG PO CAPS
25.0000 mg | ORAL_CAPSULE | Freq: Every evening | ORAL | Status: DC | PRN
  Administered 2022-09-12: 25 mg via ORAL
  Filled 2022-09-12: qty 1

## 2022-09-12 MED ORDER — SODIUM CHLORIDE 0.9 % WEIGHT BASED INFUSION: 1.0000 mL/kg/h | INTRAVENOUS | Status: DC

## 2022-09-12 MED ORDER — HEPARIN (PORCINE) IN NACL 1000-0.9 UT/500ML-% IV SOLN
INTRAVENOUS | Status: AC
  Filled 2022-09-12: qty 500

## 2022-09-12 MED ORDER — HEPARIN SODIUM (PORCINE) 1000 UNIT/ML IJ SOLN
INTRAMUSCULAR | Status: DC | PRN
  Administered 2022-09-12: 4000 [IU] via INTRAVENOUS
  Administered 2022-09-12: 10000 [IU] via INTRAVENOUS

## 2022-09-12 MED ORDER — SODIUM CHLORIDE 0.9% FLUSH: 3.0000 mL | INTRAVENOUS | Status: DC | PRN

## 2022-09-12 MED ORDER — MIDAZOLAM HCL 2 MG/2ML IJ SOLN
INTRAMUSCULAR | Status: AC
  Filled 2022-09-12: qty 2

## 2022-09-12 MED ORDER — HEPARIN (PORCINE) IN NACL 1000-0.9 UT/500ML-% IV SOLN
INTRAVENOUS | Status: DC | PRN
  Administered 2022-09-12 (×2): 500 mL

## 2022-09-12 MED ORDER — OXYMETAZOLINE HCL 0.05 % NA SOLN
1.0000 | Freq: Two times a day (BID) | NASAL | Status: DC | PRN
  Administered 2022-09-12: 1 via NASAL
  Filled 2022-09-12: qty 30

## 2022-09-12 MED ORDER — LABETALOL HCL 5 MG/ML IV SOLN: 10.0000 mg | INTRAVENOUS | Status: AC | PRN

## 2022-09-12 MED ORDER — CEFAZOLIN SODIUM-DEXTROSE 2-3 GM-%(50ML) IV SOLR
INTRAVENOUS | Status: AC | PRN
  Administered 2022-09-12: 2 g via INTRAVENOUS

## 2022-09-12 MED ORDER — GABAPENTIN 600 MG PO TABS
600.0000 mg | ORAL_TABLET | Freq: Three times a day (TID) | ORAL | Status: DC
  Administered 2022-09-12 – 2022-09-13 (×3): 600 mg via ORAL
  Filled 2022-09-12 (×3): qty 1

## 2022-09-12 MED ORDER — OXYCODONE-ACETAMINOPHEN 5-325 MG PO TABS
1.0000 | ORAL_TABLET | Freq: Four times a day (QID) | ORAL | Status: DC
  Administered 2022-09-12 (×3): 1 via ORAL
  Filled 2022-09-12 (×2): qty 1

## 2022-09-12 MED ORDER — HEPARIN SODIUM (PORCINE) 1000 UNIT/ML IJ SOLN
INTRAMUSCULAR | Status: AC
  Filled 2022-09-12: qty 10

## 2022-09-12 MED ORDER — ASPIRIN 81 MG PO TBEC
81.0000 mg | DELAYED_RELEASE_TABLET | Freq: Every day | ORAL | Status: DC
  Administered 2022-09-13: 81 mg via ORAL
  Filled 2022-09-12: qty 1

## 2022-09-12 MED ORDER — OXYCODONE HCL 5 MG PO TABS
ORAL_TABLET | ORAL | Status: AC
  Filled 2022-09-12: qty 1

## 2022-09-12 MED ORDER — ASPIRIN 81 MG PO CHEW: 81.0000 mg | CHEWABLE_TABLET | ORAL | Status: DC

## 2022-09-12 MED ORDER — CLOPIDOGREL BISULFATE 75 MG PO TABS: 75.0000 mg | ORAL_TABLET | ORAL | Status: DC

## 2022-09-12 MED ORDER — SODIUM CHLORIDE 0.9% FLUSH: 3.0000 mL | Freq: Two times a day (BID) | INTRAVENOUS | Status: DC

## 2022-09-12 MED ORDER — PANTOPRAZOLE SODIUM 40 MG PO TBEC
40.0000 mg | DELAYED_RELEASE_TABLET | Freq: Every day | ORAL | Status: DC
  Administered 2022-09-13: 40 mg via ORAL
  Filled 2022-09-12: qty 1

## 2022-09-12 MED ORDER — DICLOFENAC SODIUM 1 % EX GEL: 2.0000 g | Freq: Two times a day (BID) | CUTANEOUS | Status: DC | PRN

## 2022-09-12 MED ORDER — SILDENAFIL CITRATE 20 MG PO TABS
20.0000 mg | ORAL_TABLET | Freq: Three times a day (TID) | ORAL | Status: DC
  Administered 2022-09-12 – 2022-09-13 (×3): 20 mg via ORAL
  Filled 2022-09-12 (×5): qty 1

## 2022-09-12 MED ORDER — SODIUM CHLORIDE 0.9 % WEIGHT BASED INFUSION
3.0000 mL/kg/h | INTRAVENOUS | Status: DC
  Administered 2022-09-12: 3 mL/kg/h via INTRAVENOUS

## 2022-09-12 MED ORDER — CEFAZOLIN SODIUM-DEXTROSE 2-4 GM/100ML-% IV SOLN
2.0000 g | INTRAVENOUS | Status: DC
  Filled 2022-09-12: qty 100

## 2022-09-12 MED ORDER — FENTANYL CITRATE (PF) 100 MCG/2ML IJ SOLN
INTRAMUSCULAR | Status: AC
  Filled 2022-09-12: qty 2

## 2022-09-12 MED ORDER — HYDRALAZINE HCL 20 MG/ML IJ SOLN: 10.0000 mg | INTRAMUSCULAR | Status: AC | PRN

## 2022-09-12 MED ORDER — MIDAZOLAM HCL 2 MG/2ML IJ SOLN
INTRAMUSCULAR | Status: DC | PRN
  Administered 2022-09-12 (×4): 1 mg via INTRAVENOUS

## 2022-09-12 MED ORDER — ACETAMINOPHEN 325 MG PO TABS: 650.0000 mg | ORAL_TABLET | ORAL | Status: DC | PRN

## 2022-09-12 MED ORDER — OXYCODONE HCL 5 MG PO TABS
5.0000 mg | ORAL_TABLET | Freq: Four times a day (QID) | ORAL | Status: DC
  Administered 2022-09-12 (×3): 5 mg via ORAL
  Filled 2022-09-12 (×2): qty 1

## 2022-09-12 MED ORDER — FUROSEMIDE 40 MG PO TABS
40.0000 mg | ORAL_TABLET | Freq: Every morning | ORAL | Status: DC
  Administered 2022-09-13: 40 mg via ORAL
  Filled 2022-09-12: qty 1

## 2022-09-12 SURGICAL SUPPLY — 24 items
BALLN SIZING AMPLATZER 24 (BALLOONS) ×1
BALLOON SIZING AMPLATZER 24 (BALLOONS) IMPLANT
CATH ACUNAV 8FR 90CM (CATHETERS) IMPLANT
CATH EXPO 5F MPA-1 (CATHETERS) IMPLANT
CATH SUPER TORQUE PLUS 6F MPA1 (CATHETERS) IMPLANT
CLOSURE PERCLOSE PROSTYLE (VASCULAR PRODUCTS) IMPLANT
COVER SWIFTLINK CONNECTOR (BAG) IMPLANT
GUIDEWIRE AMPLATZER 1.5JX260 (WIRE) IMPLANT
KIT MICROPUNCTURE NIT STIFF (SHEATH) IMPLANT
MAT PREVALON FULL STRYKER (MISCELLANEOUS) IMPLANT
OCCLUDER AMPLATZER SEPTAL 16MM (Prosthesis & Implant Heart) IMPLANT
PACK CARDIAC CATHETERIZATION (CUSTOM PROCEDURE TRAY) ×1 IMPLANT
PROTECTION STATION PRESSURIZED (MISCELLANEOUS) ×1
SHEATH INTROD W/O MIN 9FR 25CM (SHEATH) IMPLANT
SHEATH PINNACLE 11FRX10 (SHEATH) IMPLANT
SHEATH PINNACLE 8F 10CM (SHEATH) IMPLANT
SHEATH PROBE COVER 6X72 (BAG) IMPLANT
STATION PROTECTION PRESSURIZED (MISCELLANEOUS) IMPLANT
SYS DELIVER AMP TREVISIO 9FR (SHEATH) ×1
SYSTEM DELIVER AMP TREVIS 9FR (SHEATH) IMPLANT
TRANSDUCER W/STOPCOCK (MISCELLANEOUS) ×1 IMPLANT
TUBING ART PRESS 72  MALE/FEM (TUBING) ×1
TUBING ART PRESS 72 MALE/FEM (TUBING) IMPLANT
WIRE EMERALD 3MM-J .035X150CM (WIRE) IMPLANT

## 2022-09-12 NOTE — Interval H&P Note (Signed)
History and Physical Interval Note:  09/12/2022 9:34 AM  Teresa Wiley  has presented today for surgery, with the diagnosis of asd.  The various methods of treatment have been discussed with the patient and family. After consideration of risks, benefits and other options for treatment, the patient has consented to  Procedure(s): ATRIAL SEPTAL DEFECT(ASD) CLOSURE (N/A) as a surgical intervention.  The patient's history has been reviewed, patient examined, no change in status, stable for surgery.  I have reviewed the patient's chart and labs.  Questions were answered to the patient's satisfaction.     Sherren Mocha

## 2022-09-12 NOTE — Progress Notes (Signed)
Report and care received from [facility]. Patient Rt groin remains soft with no active bleeding or hematoma noted. VSS. Patient complaining of Back Pain. Medication given at this time. Will continue to monitor patient.

## 2022-09-12 NOTE — Progress Notes (Signed)
Report and care given back to [facility]. Rt groin remains soft. Erline Levine giving medication for pain once order is changed.

## 2022-09-13 ENCOUNTER — Ambulatory Visit (HOSPITAL_BASED_OUTPATIENT_CLINIC_OR_DEPARTMENT_OTHER): Payer: Medicare Other

## 2022-09-13 ENCOUNTER — Encounter (HOSPITAL_COMMUNITY): Payer: Self-pay | Admitting: Cardiovascular Disease

## 2022-09-13 DIAGNOSIS — Z8774 Personal history of (corrected) congenital malformations of heart and circulatory system: Secondary | ICD-10-CM

## 2022-09-13 DIAGNOSIS — Q211 Atrial septal defect, unspecified: Secondary | ICD-10-CM

## 2022-09-13 LAB — ECHOCARDIOGRAM LIMITED
Height: 69 in
Weight: 4192 oz

## 2022-09-13 MED ORDER — OXYCODONE-ACETAMINOPHEN 5-325 MG PO TABS
1.0000 | ORAL_TABLET | Freq: Four times a day (QID) | ORAL | Status: DC
  Administered 2022-09-13 (×2): 1 via ORAL
  Filled 2022-09-13 (×2): qty 1

## 2022-09-13 MED ORDER — OXYCODONE HCL 5 MG PO TABS: 5.0000 mg | ORAL_TABLET | Freq: Four times a day (QID) | ORAL | Status: DC

## 2022-09-13 MED ORDER — OXYCODONE-ACETAMINOPHEN 5-325 MG PO TABS: 1.0000 | ORAL_TABLET | Freq: Four times a day (QID) | ORAL | Status: DC

## 2022-09-13 MED ORDER — GUAIFENESIN ER 600 MG PO TB12: 600.0000 mg | ORAL_TABLET | Freq: Two times a day (BID) | ORAL | Status: DC

## 2022-09-13 MED ORDER — OXYCODONE HCL 5 MG PO TABS
5.0000 mg | ORAL_TABLET | Freq: Four times a day (QID) | ORAL | Status: DC
  Administered 2022-09-13 (×2): 5 mg via ORAL
  Filled 2022-09-13 (×2): qty 1

## 2022-09-13 NOTE — Discharge Summary (Cosign Needed)
[facility]VE TEAM  Discharge Summary    Patient ID: Teresa Wiley MRN: [id number]; DOB: [birth year]  Admit date: 09/12/2022 Discharge date: 09/13/2022  Primary Care Provider: Penni Bombard, PA  Primary Cardiologist: Dr. Haroldine Laws, MD/ Dr. Burt Knack, MD (ASD closure)  Discharge Diagnoses    Principal Problem:   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) Active Problems:   Class 2 obesity due to excess calories with body mass index (BMI) of 35.0 to 35.9 in adult   HTN (hypertension)   Diabetes type 2, controlled ([place])   Pulmonary HTN ([place])   ASD (atrial septal defect)  Allergies Allergies  Allergen Reactions   Belbuca [Buprenorphine Hcl] Hives    Pt immediately c/o itching and hives after administration of '4mg'$  Morphine IV, required Benadryl IV for relief.    E.E.S. [Erythromycin] Anaphylaxis and Nausea And Vomiting   Iodine Hives and Other (See Comments)    Patient reports not having anaphylaxis to iodine. That was entered in mistake previously. REACTION: Hives, throat closes    Morphine And Related Hives    Pt immediately c/o itching and hives after administration of '4mg'$  Morphine IV, required Benadryl IV for relief.    Shellfish Allergy Anaphylaxis and Hives   Shellfish-Derived Products Hives   Other Rash    Electrodes stickers    Diagnostic Studies/Procedures    ASD Closure 09/12/22:  Successful transcatheter ASD closure using a 16 mm Amplatzer atrial septal occluder device under fluoroscopic and intracardiac echo imaging.   Recommendations: Overnight observation with monitoring of oxygen levels and telemetry. Aspirin and clopidogrel x6 months.  SBE prophylaxis x6 months.  Limited 2D echo tomorrow morning prior to discharge. _____________   Limited Echo 09/13/22: Completed but pending formal read at the time of discharge   History of Present Illness     Teresa Wiley is a 66 y.o. female with a  history of chronic respiratory failure on home oxygen, pulmonary embolism in 2017, and pulmonary hypertension.  Comorbid conditions include morbid obesity, chronic pain syndrome, hypertension, type 2 diabetes and ASD closure.   Teresa Wiley is followed in the advanced heart failure clinic due to pulmonary hypertension. Prior echocardiograms have shown evidence of significant pulmonary hypertension based on tricuspid regurgitation velocities. However, invasive cardiac catheterization on 2 separate occasions showed only mild pulmonary hypertension with her most recent catheterization in 11/2021 showing a mean PA pressure of 32 mmHg and a PVR of 1.5 Wood units.  She was found to have normal coronary arteries and is felt to have mild PAH due to high output state with normal PVR. She was recently hospitalized with a syncopal episode that occurred while she was having a bowel movement.  This was felt to be vagal in nature. 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  She was referred to Dr. Burt Knack for consideration of ASD closure in the context of her comorbid conditions, recurrent problems with hypoxemia, and concern about bidirectional shunting across her interatrial septal defect.  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  CT angiography has not demonstrated any evidence of pulmonary arteriovenous malformations and she has no evidence of cirrhosis or any reason to have hepatopulmonary syndrome.    Given this, she was scheduled to undergo closure  09/12/22.   Hospital Course     ASD closure: She is now s/p ASD closure with a 16 mm Amplatzer atrial septal occluder device under fluoroscopic and intracardiac echo imaging. Recommendations are for ASA '81mg'$  and Plavix '75mg'$   QD x 6 months. She was kept overnight to monitor SpO2 closely which showed saturations maintaining in the 90-96% on 2L [place]. She is edentulous and therefore will not require SBE. Limited echo performed however final results are pending at this time. Post op groin site instructions reviewed with understanding. Plan close follow up with SHT, then one year follow up with repeat limited echo with bubble.   Chronic respiratory failure on home oxygen: Maintaining adequate saturations. Needs to ambulate with mobility/CR and follow response.   Pulmonary hypertension: Prior echocardiograms have shown evidence of significant pulmonary hypertension based on tricuspid regurgitation velocities. However, invasive cardiac catheterization on 2 separate occasions showed only mild pulmonary hypertension with her most recent catheterization in 11/2021 showing a mean PA pressure of 32 mmHg and a PVR of 1.5 Wood units.    DM2: Continue current regimen.   Consultants: None    The patient has been seen and examined by Dr. Burt Knack who feels that she is stable and ready for discharge today, 09/13/22.  _____________  Discharge Vitals Blood pressure 127/69, pulse 82, temperature 97.8 F (36.6 C), temperature source Oral, resp. rate 14, height '5\' 9"'$  (1.753 m), weight 118.8 kg, SpO2 96 %.  Filed Weights   09/12/22 0847  Weight: 118.8 kg   General: Obese, NAD Neck: Negative for carotid bruits. No JVD Lungs:Clear to ausculation bilaterally. No wheezes, rales, or rhonchi. Breathing is unlabored. Cardiovascular: RRR with S1 S2. No murmurs Extremities: No edema.  Neuro: Alert and oriented. No focal deficits. No facial asymmetry. MAE spontaneously. Psych: Responds to questions appropriately with normal affect.    Labs & Radiologic Studies    CBC Recent Labs    09/12/22 1021  HGB 10.2*  HCT 87.8*   Basic Metabolic Panel Recent Labs    09/12/22 1021  NA 140  K 3.9   Liver Function Tests No results for input(s):  "AST", "ALT", "ALKPHOS", "BILITOT", "PROT", "ALBUMIN" in the last 72 hours. No results for input(s): "LIPASE", "AMYLASE" in the last 72 hours. Cardiac Enzymes No results for input(s): "CKTOTAL", "CKMB", "CKMBINDEX", "TROPONINI" in the last 72 hours. BNP Invalid input(s): "POCBNP" D-Dimer No results for input(s): "DDIMER" in the last 72 hours. Hemoglobin A1C No results for input(s): "HGBA1C" in the last 72 hours. Fasting Lipid Panel No results for input(s): "CHOL", "HDL", "LDLCALC", "TRIG", "CHOLHDL", "LDLDIRECT" in the last 72 hours. Thyroid Function Tests No results for input(s): "TSH", "T4TOTAL", "T3FREE", "THYROIDAB" in the last 72 hours.  Invalid input(s): "FREET3" _____________  CARDIAC CATHETERIZATION  Result Date: 09/12/2022 Successful transcatheter ASD closure using a 16 mm Amplatzer atrial septal occluder device under fluoroscopic and intracardiac echo imaging. Recommendations: Overnight observation with monitoring of oxygen levels and telemetry.  Aspirin and clopidogrel x6 months.  SBE prophylaxis x6 months.  Limited 2D echo tomorrow morning prior to discharge.   Disposition   Pt is being discharged home today in good condition.  Follow-up Plans & Appointments    Follow-up Information     Tommie Raymond, NP Follow up on 10/16/2022.   Specialty: Cardiology Why: @ 130pm. Please arrive at 1:15pm. Contact information: [address] [place] [address] [phone]                Discharge  Instructions     Call MD for:  difficulty breathing, headache or visual disturbances   Complete by: As directed    Call MD for:  extreme fatigue   Complete by: As directed    Call MD for:  hives   Complete by: As directed    Call MD for:  persistant dizziness or light-headedness   Complete by: As directed    Call MD for:  persistant nausea and vomiting   Complete by: As directed    Call MD for:  redness, tenderness, or signs of infection (pain, swelling,  redness, odor or green/yellow discharge around incision site)   Complete by: As directed    Call MD for:  severe uncontrolled pain   Complete by: As directed    Call MD for:  temperature >100.4   Complete by: As directed    Diet - low sodium heart healthy   Complete by: As directed    Discharge instructions   Complete by: As directed    Procedure MD: Dr. Burt Knack  This sheet gives you information about how to care for yourself after your procedure. Your health care provider may also give you more specific instructions. If you have problems or questions, contact your health care provider.  What can I expect after the procedure? After the procedure, it is common to have: Bruising around your puncture site. Tenderness around your puncture site. Tiredness (fatigue).  Follow up You will be seen in 1 month after your procedure  You will have another echocardiogram with bubble will be performed at one year after to check your device  Follow these instructions at home: Puncture site care  Follow instructions from your health care provider about how to take care of your puncture site. Make sure you: If present, leave stitches (sutures), skin glue, or adhesive strips in place.  If a large square bandage is present, this may be removed 24 hours after surgery.  Check your puncture site every day for signs of infection. Check for: Redness, swelling, or pain. Fluid or blood. If your puncture site starts to bleed, lie down on your back, apply firm pressure to the area, and contact your health care provider. Warmth. Pus or a bad smell. Driving Do not drive yourself home if you received sedation Do not drive for at least 4 days after your procedure or however long your health care provider recommends. (Do not resume driving if you have previously been instructed not to drive for other health reasons.) Do not spend greater than 1 hour at a time in a car for the first 3 days. Stop and take a break  with a 5 minute walk at least every hour.  Do not drive or use heavy machinery while taking prescription pain medicine.  Activity Avoid activities that take a lot of effort, including exercise, for at least 7 days after your procedure. For the first 3 days, avoid sitting for longer than one hour at a time.  Avoid alcoholic beverages, signing paperwork, or participating in legal proceedings for 24 hours after receiving sedation Do not lift anything that is heavier than 10 lb (4.5 kg) for one week.  No sexual activity for 1 week.  Return to your normal activities as told by your health care provider. Ask your health care provider what activities are safe for you. General instructions Take over-the-counter and prescription medicines only as told by your health care provider. Do not use any products that contain nicotine or tobacco, such as cigarettes  and e-cigarettes. If you need help quitting, ask your health care provider. You may shower after 24 hours, but Do not take baths, swim, or use a hot tub for 1 week.  Do not drink alcohol for 24 hours after your procedure. Keep all follow-up visits as told by your health care provider. This is important. Contact a health care provider if: You have redness, mild swelling, or pain around your puncture site. You have soreness in your throat or at your puncture site that does not improve after several days You have fluid or blood coming from your puncture site that stops after applying firm pressure to the area. Your puncture site feels warm to the touch. You have pus or a bad smell coming from your puncture site. You have a fever. You have chest pain or discomfort that spreads to your neck, jaw, or arm. You are sweating a lot. You feel nauseous. You have a fast or irregular heartbeat. You have shortness of breath. You are dizzy or light-headed and feel the need to lie down. You have pain or numbness in the arm or leg closest to your puncture  site. Get help right away if: Your puncture site suddenly swells. Your puncture site is bleeding and the bleeding does not stop after applying firm pressure to the area. These symptoms may represent a serious problem that is an emergency. Do not wait to see if the symptoms will go away. Get medical help right away. Call your local emergency services (911 in the [place] Do not drive yourself to the hospital. Summary After the procedure, it is normal to have bruising and tenderness at the puncture site in your groin, neck, or forearm. Check your puncture site every day for signs of infection. Get help right away if your puncture site is bleeding and the bleeding does not stop after applying firm pressure to the area. This is a medical emergency.  This information is not intended to replace advice given to you by your health care provider. Make sure you discuss any questions you have with your health care provider.   Increase activity slowly   Complete by: As directed       Discharge Medications   Allergies as of 09/13/2022       Reactions   Belbuca [buprenorphine Hcl] Hives   Pt immediately c/o itching and hives after administration of '4mg'$  Morphine IV, required Benadryl IV for relief.   E.e.s. [erythromycin] Anaphylaxis, Nausea And Vomiting   Iodine Hives, Other (See Comments)   Patient reports not having anaphylaxis to iodine. That was entered in mistake previously. REACTION: Hives, throat closes   Morphine And Related Hives   Pt immediately c/o itching and hives after administration of '4mg'$  Morphine IV, required Benadryl IV for relief.   Shellfish Allergy Anaphylaxis, Hives   Shellfish-derived Products Hives   Other Rash   Electrodes stickers        Medication List     TAKE these medications    ALPRAZolam 1 MG tablet Commonly known as: XANAX Take 1 mg by mouth 2 (two) times daily as needed for anxiety.   aspirin EC 81 MG tablet Take 1 tablet (81 mg total) by mouth daily.  Swallow whole.   carvedilol 12.5 MG tablet Commonly known as: COREG Take 12.5 mg by mouth 2 (two) times daily with a meal.   clopidogrel 75 MG tablet Commonly known as: PLAVIX Take 1 tablet by mouth daily beginning 5 days prior to procedure   diphenhydrAMINE 25  MG tablet Commonly known as: BENADRYL Take 100 mg by mouth at bedtime as needed for sleep.   esomeprazole 20 MG capsule Commonly known as: NEXIUM Take 20 mg by mouth daily before breakfast.   furosemide 40 MG tablet Commonly known as: LASIX Take 40 mg by mouth in the morning.   gabapentin 600 MG tablet Commonly known as: NEURONTIN Take 600 mg by mouth 3 (three) times daily.   oxyCODONE-acetaminophen 10-325 MG tablet Commonly known as: PERCOCET Take 1 tablet by mouth 4 (four) times daily.   OXYGEN Inhale 2.5 L into the lungs continuous.   Ozempic (1 MG/DOSE) 4 MG/3ML Sopn Generic drug: Semaglutide (1 MG/DOSE) Inject 1 mg into the skin every Wednesday.   pantoprazole 40 MG tablet Commonly known as: PROTONIX Take 40 mg by mouth daily before breakfast.   sildenafil 20 MG tablet Commonly known as: REVATIO Take 1 tablet (20 mg total) by mouth 3 (three) times daily.   Vitamin D (Ergocalciferol) 1.25 MG (50000 UNIT) Caps capsule Commonly known as: DRISDOL Take 50,000 Units by mouth every Wednesday.   Voltaren Arthritis Pain 1 % Gel Generic drug: diclofenac Sodium Apply 2 g topically in the morning and at bedtime.        Outstanding Labs/Studies   None   Duration of Discharge Encounter   Greater than 30 minutes including physician time.  SignedKathyrn Drown, NP 09/13/2022, 10:59 AM [phone]   Patient seen, examined. Available data reviewed. Agree with findings, assessment, and plan as outlined by Kathyrn Drown, NP.  Patient seen in the morning of discharge.  No family at bedside.  Doing well, medically stable.  No chest pain or shortness of breath.  Lungs clear to auscultation bilaterally,  heart regular rate and rhythm no murmur gallop, right groin site is clear with no hematoma or ecchymosis, lower extremities with no edema, skin is warm and dry with no rash.  Limited 2D echocardiogram shows intact atrial septal closure device with no obvious shunt by color-flow Doppler assessment.  Patient is medically stable for discharge today on DAPT with aspirin and clopidogrel.  Follow-up arranged as above.  Sherren Mocha, M.D. 09/16/2022 4:34 PM

## 2022-09-13 NOTE — Progress Notes (Signed)
Mobility Specialist Progress Note:   09/13/22 1201  Mobility  Activity Ambulated with assistance in hallway  Level of Assistance Standby assist, set-up cues, supervision of patient - no hands on  Assistive Device None  Distance Ambulated (ft) 350 ft  Activity Response Tolerated well  $Mobility charge 1 Mobility   Pt received in bed willing to participate in mobility. Complaints of whole body aching. Left in bed with call bell in reach and all needs met.   [facility] Surveyor, mining Chat only

## 2022-09-14 LAB — LIPOPROTEIN A (LPA): Lipoprotein (a): 246.9 nmol/L — ABNORMAL HIGH (ref <75.0)

## 2022-09-17 ENCOUNTER — Encounter (HOSPITAL_COMMUNITY): Payer: Self-pay | Admitting: Cardiovascular Disease

## 2022-10-01 ENCOUNTER — Inpatient Hospital Stay (HOSPITAL_COMMUNITY)
Admission: RE | Admit: 2022-10-01 | Discharge: 2022-10-01 | Disposition: A | Discharge status: Home / Self Care | Payer: Medicare Other | Source: Ambulatory Visit | Attending: Internal Medicine | Admitting: Internal Medicine

## 2022-10-01 ENCOUNTER — Other Ambulatory Visit (HOSPITAL_COMMUNITY): Payer: Self-pay | Admitting: Internal Medicine

## 2022-10-01 ENCOUNTER — Ambulatory Visit (HOSPITAL_COMMUNITY)
Admission: RE | Admit: 2022-10-01 | Discharge: 2022-10-01 | Disposition: A | Discharge status: Home / Self Care | Payer: Medicare Other | Source: Ambulatory Visit | Attending: Internal Medicine | Admitting: Internal Medicine

## 2022-10-01 VITALS — BP 120/78 | HR 92 | Wt 261.2 lb

## 2022-10-01 DIAGNOSIS — G894 Chronic pain syndrome: Secondary | ICD-10-CM | POA: Insufficient documentation

## 2022-10-01 DIAGNOSIS — I272 Pulmonary hypertension, unspecified: Secondary | ICD-10-CM | POA: Diagnosis present

## 2022-10-01 DIAGNOSIS — Q2111 Secundum atrial septal defect: Secondary | ICD-10-CM | POA: Insufficient documentation

## 2022-10-01 DIAGNOSIS — R002 Palpitations: Secondary | ICD-10-CM | POA: Diagnosis not present

## 2022-10-01 DIAGNOSIS — I110 Hypertensive heart disease with heart failure: Secondary | ICD-10-CM | POA: Insufficient documentation

## 2022-10-01 DIAGNOSIS — I5032 Chronic diastolic (congestive) heart failure: Secondary | ICD-10-CM | POA: Insufficient documentation

## 2022-10-01 DIAGNOSIS — R6520 Severe sepsis without septic shock: Secondary | ICD-10-CM | POA: Diagnosis not present

## 2022-10-01 DIAGNOSIS — Z86711 Personal history of pulmonary embolism: Secondary | ICD-10-CM | POA: Insufficient documentation

## 2022-10-01 DIAGNOSIS — G4733 Obstructive sleep apnea (adult) (pediatric): Secondary | ICD-10-CM | POA: Diagnosis not present

## 2022-10-01 DIAGNOSIS — Z9981 Dependence on supplemental oxygen: Secondary | ICD-10-CM | POA: Diagnosis not present

## 2022-10-01 DIAGNOSIS — F419 Anxiety disorder, unspecified: Secondary | ICD-10-CM | POA: Insufficient documentation

## 2022-10-01 DIAGNOSIS — Z6838 Body mass index (BMI) 38.0-38.9, adult: Secondary | ICD-10-CM | POA: Insufficient documentation

## 2022-10-01 DIAGNOSIS — E119 Type 2 diabetes mellitus without complications: Secondary | ICD-10-CM | POA: Insufficient documentation

## 2022-10-01 DIAGNOSIS — Q211 Atrial septal defect, unspecified: Secondary | ICD-10-CM | POA: Diagnosis not present

## 2022-10-01 DIAGNOSIS — R0789 Other chest pain: Secondary | ICD-10-CM | POA: Insufficient documentation

## 2022-10-01 LAB — BRAIN NATRIURETIC PEPTIDE: B Natriuretic Peptide: 32.2 pg/mL (ref 0.0–100.0)

## 2022-10-01 LAB — BASIC METABOLIC PANEL
Anion gap: 8 (ref 5–15)
BUN: 14 mg/dL (ref 8–23)
CO2: 30 mmol/L (ref 22–32)
Calcium: 9 mg/dL (ref 8.9–10.3)
Chloride: 103 mmol/L (ref 98–111)
Creatinine, Ser: 1.12 mg/dL — ABNORMAL HIGH (ref 0.44–1.00)
GFR, Estimated: 55 mL/min — ABNORMAL LOW (ref >60)
Glucose, Bld: 115 mg/dL — ABNORMAL HIGH (ref 70–99)
Potassium: 4.1 mmol/L (ref 3.5–5.1)
Sodium: 141 mmol/L (ref 135–145)

## 2022-10-01 MED ORDER — CARVEDILOL 6.25 MG PO TABS: 6.2500 mg | ORAL_TABLET | Freq: Two times a day (BID) | ORAL | 6 refills | Status: DC
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
## 2022-10-01 NOTE — Patient Instructions (Signed)
Medication Changes:  Decrease Carvedilol to 6.25 mg Twice daily   Lab Work:  Labs done today, your results will be available in MyChart, we will contact you for abnormal readings.  Testing/Procedures:  Your provider has recommended that  you wear a Zio Patch for 14 days.  This monitor will record your heart rhythm for our review.  IF you have any symptoms while wearing the monitor please press the button.  If you have any issues with the patch or you notice a red or orange light on it please call the company at [phone].  Once you remove the patch please mail it back to the company as soon as possible so we can get the results.  Referrals:  You have been referred to Pulmonary Rehab, they will call you to schedule  Special Instructions // Education:  Do the following things EVERYDAY: Weigh yourself in the morning before breakfast. Write it down and keep it in a log. Take your medicines as prescribed Eat low salt foods--Limit salt (sodium) to 2000 mg per day.  Stay as active as you can everyday Limit all fluids for the day to less than 2 liters   Follow-Up in: 6 months, **PLEASE CALL OUR OFFICE IN MARCH TO SCHEDULE THIS APPOINTMENT  At the [facility], you and your health needs are our priority. We have a designated team specialized in the treatment of Heart Failure. This Care Team includes your primary Heart Failure Specialized Cardiologist (physician), Advanced Practice Providers (APPs- Physician Assistants and Nurse Practitioners), and Pharmacist who all work together to provide you with the care you need, when you need it.   You may see any of the following providers on your designated Care Team at your next follow up:  Dr. Glori Bickers Dr. Loralie Champagne Dr. Roxana Hires, NP Lyda Jester, [place] [facility], [place] Forestine Na, NP Audry Riles, PharmD   Please be sure to bring in all your medications bottles to  every appointment.   Need to Contact us:  If you have any questions or concerns before your next appointment please send [place] a message through [place] or call our office at [phone].    TO LEAVE A MESSAGE FOR THE NURSE SELECT OPTION 2, PLEASE LEAVE A MESSAGE INCLUDING: YOUR NAME DATE OF BIRTH CALL BACK NUMBER REASON FOR CALL**this is important as we prioritize the call backs  YOU WILL RECEIVE A CALL BACK THE SAME DAY AS LONG AS YOU CALL BEFORE 4:00 PM

## 2022-10-02 ENCOUNTER — Ambulatory Visit: Payer: Medicare Other | Admitting: Internal Medicine

## 2022-10-02 ENCOUNTER — Telehealth: Payer: Self-pay | Admitting: *Deleted

## 2022-10-02 NOTE — Telephone Encounter (Signed)
Patient arrived after time of appointment, she states she got the time of her appointment wrong.  Dr. Shearon Stalls was gone for the day and all other providers already had full schedules.  I checked her oxygen level and she was 99% on 2L of oxygen.  She was speaking in full sentences.  She saw her heart doctor on 11/7 and she was wearing a heart monitor that alerts them to any abnormality.  Advised her that she would need to be seen in the ED as she was c/o heaviness in her chest.  Initially, she rescheduled for Friday at 3 pm.  After speaking with her she agreed to come in tomorrow at 1 pm, advised to arrive at 12:45 pm for check in.  Advised if anything changed in the meantime to go to the ED.  She verbalized understanding.  Nothing further needed.

## 2022-10-03 ENCOUNTER — Ambulatory Visit: Payer: Medicare Other | Admitting: Internal Medicine

## 2022-10-04 ENCOUNTER — Ambulatory Visit: Payer: Medicare Other | Admitting: Internal Medicine

## 2022-10-07 ENCOUNTER — Encounter (HOSPITAL_COMMUNITY): Payer: Self-pay

## 2022-10-08 ENCOUNTER — Telehealth (HOSPITAL_COMMUNITY): Payer: Self-pay

## 2022-10-08 NOTE — Telephone Encounter (Signed)
Pt returned PR phone call and stated she is interested in PR. Explained scheduling process, patient verbalized understanding.

## 2022-10-15 ENCOUNTER — Ambulatory Visit (INDEPENDENT_AMBULATORY_CARE_PROVIDER_SITE_OTHER): Payer: Medicare Other | Admitting: Internal Medicine

## 2022-10-15 ENCOUNTER — Encounter: Payer: Self-pay | Admitting: Internal Medicine

## 2022-10-15 VITALS — BP 120/68 | HR 85 | Temp 98.8°F | Ht 70.0 in | Wt 258.2 lb

## 2022-10-15 DIAGNOSIS — J9611 Chronic respiratory failure with hypoxia: Secondary | ICD-10-CM | POA: Diagnosis not present

## 2022-10-15 NOTE — Patient Instructions (Signed)
Please schedule follow up scheduled with myself in 4 months.  If my schedule is not open yet, we will contact you with a reminder closer to that time. Please call [phone] if you haven't heard from [place] a month before.   Please call pulmonary rehab to follow up on scheduling [phone].  I agree this is probably the best thing you need for Follow up with cardiology and repeat the ultrasound of your heart as instructed. You have a follow up appointment with them tomorrow.  We did walk you today to see how much oxygen you need since your procedure in October and you were not able to tolerate the puls oxygen needed for a portable oxygen concentrator. Unfortunately we will have to continue the tanks for now.   What is GERD? Gastroesophageal reflux disease (GERD) is gastroesophageal reflux diseasewhich occurs when the lower esophageal sphincter (LES) opens spontaneously, for varying periods of time, or does not close properly and stomach contents rise up into the esophagus. GER is also called acid reflux or acid regurgitation, because digestive juices--called acids--rise up with the food. The esophagus is the tube that carries food from the mouth to the stomach. The LES is a ring of muscle at the bottom of the esophagus that acts like a valve between the esophagus and stomach.  When acid reflux occurs, food or fluid can be tasted in the back of the mouth. When refluxed stomach acid touches the lining of the esophagus it may cause a burning sensation in the chest or throat called heartburn or acid indigestion. Occasional reflux is common. Persistent reflux that occurs more than twice a week is considered GERD, and it can eventually lead to more serious health problems. People of all ages can have GERD. Studies have shown that GERD may worsen or contribute to asthma, chronic cough, and pulmonary fibrosis.   What are the symptoms of GERD? The main symptom of GERD in adults is frequent heartburn, also  called acid indigestion--burning-type pain in the lower part of the mid-chest, behind the breast bone, and in the mid-abdomen.  Not all reflux is acidic in nature, and many patients don't have heart burn at all. Sometimes it feels like a cough (either dry or with mucus), choking sensation, asthma, shortness of breath, waking up at night, frequent throat clearing, or trouble swallowing.    What causes GERD? The reason some people develop GERD is still unclear. However, research shows that in people with GERD, the LES relaxes while the rest of the esophagus is working. Anatomical abnormalities such as a hiatal hernia may also contribute to GERD. A hiatal hernia occurs when the upper part of the stomach and the LES move above the diaphragm, the muscle wall that separates the stomach from the chest. Normally, the diaphragm helps the LES keep acid from rising up into the esophagus. When a hiatal hernia is present, acid reflux can occur more easily. A hiatal hernia can occur in people of any age and is most often a normal finding in otherwise healthy people over age 33. Most of the time, a hiatal hernia produces no symptoms.   Other factors that may contribute to GERD include - Obesity or recent weight gain - Pregnancy  - Smoking  - Diet - Certain medications  Common foods that can worsen reflux symptoms include: - carbonated beverages - artificial sweeteners - citrus fruits  - chocolate  - drinks with caffeine or alcohol  - fatty and fried foods  - garlic and  onions  - mint flavorings  - spicy foods  - tomato-based foods, like spaghetti sauce, salsa, chili, and pizza   Lifestyle Changes If you smoke, stop.  Avoid foods and beverages that worsen symptoms (see above.) Lose weight if needed.  Eat small, frequent meals.  Wear loose-fitting clothes.  Avoid lying down for 3 hours after a meal.  Raise the head of your bed 6 to 8 inches by securing wood blocks under the bedposts. Just using extra  pillows will not help, but using a wedge-shaped pillow may be helpful.  Medications  H2 blockers, such as cimetidine (Tagamet HB), famotidine (Pepcid AC), nizatidine (Axid AR), and ranitidine (Zantac 75), decrease acid production. They are available in prescription strength and over-the-counter strength. These drugs provide short-term relief and are effective for about half of those who have GERD symptoms.  Proton pump inhibitors include omeprazole (Prilosec, Zegerid), lansoprazole (Prevacid), pantoprazole (Protonix), rabeprazole (Aciphex), and esomeprazole (Nexium), which are available by prescription. Prilosec is also available in over-the-counter strength. Proton pump inhibitors are more effective than H2 blockers and can relieve symptoms and heal the esophageal lining in almost everyone who has GERD.  Because drugs work in different ways, combinations of medications may help control symptoms. People who get heartburn after eating may take both antacids and H2 blockers. The antacids work first to neutralize the acid in the stomach, and then the H2 blockers act on acid production. By the time the antacid stops working, the H2 blocker will have stopped acid production. Your health care provider is the best source of information about how to use medications for GERD.   Points to Remember 1. You can have GERD without having heartburn. Your symptoms could include a dry cough, asthma symptoms, or trouble swallowing.  2. Taking medications daily as prescribed is important in controlling you symptoms.  Sometimes it can take up to 8 weeks to fully achieve the effects of the medications prescribed.  3. Coughing related to GERD can be difficult to treat and is very frustrating!  However, it is important to stick with these medications and lifestyle modifications before pursuing more aggressive or invasive test and treatments.
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
## 2022-10-16 ENCOUNTER — Ambulatory Visit: Payer: Medicare Other

## 2022-10-16 DIAGNOSIS — Z9889 Other specified postprocedural states: Secondary | ICD-10-CM

## 2022-10-22 ENCOUNTER — Encounter (HOSPITAL_COMMUNITY): Payer: Self-pay
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
## 2022-10-28 ENCOUNTER — Telehealth: Payer: Self-pay

## 2022-10-28 ENCOUNTER — Ambulatory Visit: Payer: Medicare Other

## 2022-10-28 NOTE — Telephone Encounter (Signed)
The patient requires a late appointment. Scheduled her with Nell Range on 11/06/22 at 1530. She was grateful for assistance.

## 2022-10-28 NOTE — Telephone Encounter (Signed)
Left message to call back to reschedule 1 month ASD visit.

## 2022-10-28 NOTE — Telephone Encounter (Signed)
Pt calling to r/s her appt today at 12:00pm
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
## 2022-11-06 ENCOUNTER — Ambulatory Visit: Payer: Medicare Other | Attending: Cardiovascular Disease | Admitting: Physician Assistant

## 2022-11-06 VITALS — BP 110/72 | HR 77 | Ht 69.25 in | Wt 261.0 lb

## 2022-11-06 DIAGNOSIS — R079 Chest pain, unspecified: Secondary | ICD-10-CM

## 2022-11-06 DIAGNOSIS — I272 Pulmonary hypertension, unspecified: Secondary | ICD-10-CM | POA: Diagnosis not present

## 2022-11-06 DIAGNOSIS — R002 Palpitations: Secondary | ICD-10-CM | POA: Diagnosis not present

## 2022-11-06 DIAGNOSIS — Z9889 Other specified postprocedural states: Secondary | ICD-10-CM

## 2022-11-06 NOTE — Patient Instructions (Signed)
Medication Instructions:  Your physician recommends that you continue on your current medications as directed. Please refer to the Current Medication list given to you today.  *If you need a refill on your cardiac medications before your next appointment, please call your pharmacy*   Lab Work: None If you have labs (blood work) drawn today and your tests are completely normal, you will receive your results only by: [place] (if you have MyChart) OR A paper copy in the mail If you have any lab test that is abnormal or we need to change your treatment, we will call you to review the results.   Testing/Procedures: Your physician has requested that you have an echocardiogram on 12/30/2022 at 3:00 PM. Echocardiography is a painless test that uses sound waves to create images of your heart. It provides your doctor with information about the size and shape of your heart and how well your heart's chambers and valves are working. This procedure takes approximately one hour. There are no restrictions for this procedure. Please do NOT wear cologne, perfume, aftershave, or lotions (deodorant is allowed). Please arrive 15 minutes prior to your appointment time.    Follow-Up: At [facility], you and your health needs are our priority.  As part of our continuing mission to provide you with exceptional heart care, we have created designated Provider Care Teams.  These Care Teams include your primary Cardiologist (physician) and Advanced Practice Providers (APPs -  Physician Assistants and Nurse Practitioners) who all work together to provide you with the care you need, when you need it.  Your next appointment:   Schedule an appointment for May 2024  The format for your next appointment:   In Person  Provider:   Dr Haroldine Laws  Important Information About Sugar

## 2022-11-07 MED ORDER — AMOXICILLIN 500 MG PO TABS: 2000.0000 mg | ORAL_TABLET | ORAL | 0 refills | Status: DC

## 2022-11-19 NOTE — Addendum Note (Signed)
Encounter addended by: Micki Riley, RN on: 11/19/2022 9:00 AM  Actions taken: Imaging Exam ended

## 2022-11-27 ENCOUNTER — Telehealth: Payer: Self-pay | Admitting: Cardiology

## 2022-11-27 NOTE — Telephone Encounter (Signed)
Patient came in today wanting to follow-up in regards to results she received from [place]. Patient would like to have someone reach out to her this week, in order to review the results and answer any questions she may have. Primary contact number would be the mobile number associated within the patient's chart.

## 2022-11-28 ENCOUNTER — Telehealth (HOSPITAL_COMMUNITY): Payer: Self-pay

## 2022-11-28 NOTE — Telephone Encounter (Signed)
Pt called and stated she is interested in PR. Will open pt PR referral back open.

## 2022-11-28 NOTE — Telephone Encounter (Signed)
Spoke with pt and she states that she was calling in regards to her monitor results. Pt states that she has reached out to Dr. Vaughan Browner office in regards to results.

## 2022-11-29 ENCOUNTER — Telehealth (HOSPITAL_COMMUNITY): Payer: Self-pay

## 2022-11-29 NOTE — Telephone Encounter (Signed)
Pt returned PR phone call and stated she is interested in PR. Patient will come in for orientation on 12/23/22 @ 10:30AM and will attend the 1:15PM exercise class. Went over insurance, patient verbalized understanding.   Tourist information centre manager.

## 2022-11-29 NOTE — Telephone Encounter (Signed)
Attempted to call patient in regards to Pulmonary Rehab - LM on VM Mailed letter 

## 2022-12-12 ENCOUNTER — Ambulatory Visit (INDEPENDENT_AMBULATORY_CARE_PROVIDER_SITE_OTHER): Payer: Medicare Other | Admitting: Adult Health

## 2022-12-12 ENCOUNTER — Encounter: Payer: Self-pay | Admitting: Adult Health

## 2022-12-12 VITALS — BP 108/64 | HR 95 | Ht 69.38 in | Wt 254.0 lb

## 2022-12-12 DIAGNOSIS — R5381 Other malaise: Secondary | ICD-10-CM

## 2022-12-12 DIAGNOSIS — I272 Pulmonary hypertension, unspecified: Secondary | ICD-10-CM | POA: Diagnosis not present

## 2022-12-12 DIAGNOSIS — J9611 Chronic respiratory failure with hypoxia: Secondary | ICD-10-CM

## 2022-12-12 DIAGNOSIS — R531 Weakness: Secondary | ICD-10-CM | POA: Diagnosis not present

## 2022-12-12 NOTE — Assessment & Plan Note (Signed)
Continue on oxygen to maintain O2 saturations greater than 88 to 90%.  Needs 2 L of oxygen.  Also needs a POC to help with mobility.  Requires 2 L of pulsed oxygen to maintain O2 saturation greater than 88 to 90%  Plan  Patient Instructions  Shower chair  Rolling walker with bench seat /basket  Continue on Oxygen 2l/m  Order for POC.  Pulmonary rehab as planned.  Follow up with Dr. Shearon Stalls in 3 months and As needed

## 2022-12-12 NOTE — Assessment & Plan Note (Signed)
Weakness, gait imbalance-needs shower chair at home to help with stability while bathing.  Also needs a rolling walker with bench seat to help with her gait and also with heavy oxygen equipment.  Plan  Patient Instructions  Shower chair  Rolling walker with bench seat Bufford Buttner  Continue on Oxygen 2l/m  Order for POC.  Pulmonary rehab as planned.  Follow up with Dr. Shearon Stalls in 3 months and As needed
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
## 2022-12-12 NOTE — Patient Instructions (Addendum)
Shower Teacher, early years/pre with bench seat /basket  Continue on Oxygen 2l/m  Order for POC.  Pulmonary rehab as planned.  Follow up with Dr. Shearon Stalls in 3 months and As needed

## 2022-12-12 NOTE — Assessment & Plan Note (Signed)
Continue follow-up with cardiology and current regimen No evidence of volume overload on exam.  Plan  Patient Instructions  Shower chair  Rolling walker with bench seat /basket  Continue on Oxygen 2l/m  Order for POC.  Pulmonary rehab as planned.  Follow up with Dr. Shearon Stalls in 3 months and As needed

## 2022-12-12 NOTE — Assessment & Plan Note (Signed)
Physical deconditioning-pulmonary rehab next month as planned.  Order for shower chair and rolling walker with bench seat

## 2022-12-19 ENCOUNTER — Other Ambulatory Visit: Payer: Self-pay | Admitting: Cardiovascular Disease

## 2022-12-19 DIAGNOSIS — Q211 Atrial septal defect, unspecified: Secondary | ICD-10-CM

## 2022-12-19 DIAGNOSIS — Z01810 Encounter for preprocedural cardiovascular examination: Secondary | ICD-10-CM

## 2022-12-23 ENCOUNTER — Telehealth (HOSPITAL_COMMUNITY): Payer: Self-pay

## 2022-12-23 ENCOUNTER — Other Ambulatory Visit (HOSPITAL_COMMUNITY): Payer: Self-pay | Admitting: Gastroenterology

## 2022-12-23 ENCOUNTER — Ambulatory Visit (HOSPITAL_COMMUNITY): Payer: Medicare Other

## 2022-12-23 DIAGNOSIS — K219 Gastro-esophageal reflux disease without esophagitis: Secondary | ICD-10-CM

## 2022-12-23 NOTE — Telephone Encounter (Signed)
Called Teresa Wiley since I did not see her for orientation. Zennie stated she could not find the department number to call and cancel her appt. I told Shyah that I will return her file to the schedulers to be rescheduled. Jael voiced understanding.

## 2022-12-24 ENCOUNTER — Other Ambulatory Visit (HOSPITAL_COMMUNITY): Payer: Self-pay | Admitting: Internal Medicine

## 2022-12-30 ENCOUNTER — Other Ambulatory Visit (HOSPITAL_COMMUNITY): Payer: Medicare Other

## 2022-12-31 ENCOUNTER — Ambulatory Visit (HOSPITAL_COMMUNITY): Payer: Medicare Other

## 2023-01-01 ENCOUNTER — Other Ambulatory Visit (HOSPITAL_COMMUNITY): Payer: Self-pay | Admitting: Gastroenterology

## 2023-01-01 ENCOUNTER — Ambulatory Visit (HOSPITAL_COMMUNITY)
Admission: RE | Admit: 2023-01-01 | Discharge: 2023-01-01 | Disposition: A | Discharge status: Home / Self Care | Payer: Medicare Other | Source: Ambulatory Visit | Attending: Gastroenterology | Admitting: Gastroenterology

## 2023-01-01 ENCOUNTER — Telehealth: Payer: Self-pay

## 2023-01-01 DIAGNOSIS — K219 Gastro-esophageal reflux disease without esophagitis: Secondary | ICD-10-CM

## 2023-01-01 NOTE — Telephone Encounter (Signed)
Received a medical clearance form from [facility] , requesting Cardiology clearance for the following procedure: Left ring finger trigger finger release. Medical clearance form was signed by Dr. Glori Bickers and successfully faxed to [phone] on Wednesday, February 7,. Form will be scanned into patients chart.

## 2023-01-02 ENCOUNTER — Ambulatory Visit (HOSPITAL_COMMUNITY): Payer: Medicare Other

## 2023-01-06 ENCOUNTER — Telehealth: Payer: Self-pay | Admitting: Adult Health

## 2023-01-06 NOTE — Telephone Encounter (Signed)
Order placed on 1/18 and I sent on 1/19 & confirmation received from [facility] on 1/19.  Called Mardene Celeste and had to leave a vm to check on status of order.

## 2023-01-06 NOTE — Telephone Encounter (Signed)
Order for POC was placed 12/12/22. Routing to [facility] for review.

## 2023-01-07 ENCOUNTER — Ambulatory Visit (HOSPITAL_COMMUNITY): Payer: Medicare Other

## 2023-01-07 NOTE — Telephone Encounter (Signed)
Pls call PT regarding this issue @ [phone] Or you can call [phone]. I let her know we tried to reach her before.

## 2023-01-07 NOTE — Telephone Encounter (Signed)
Received call from Mardene Celeste - she states pt was contacted on 1/27 and she said before she paid copay for poc she wanted to make sure it was something she could use.  She was given ph # and address to [facility] office so she could contact them.  Mardene Celeste states that's the last thing in the notes.  I called pt to make her aware and had to leave a vm.

## 2023-01-07 NOTE — Telephone Encounter (Signed)
Spoke with the pt and notified of response per Judeen Hammans below  She will call [facility] now  Nothing further needed

## 2023-01-09 ENCOUNTER — Ambulatory Visit (HOSPITAL_COMMUNITY): Payer: Medicare Other

## 2023-01-14 ENCOUNTER — Ambulatory Visit (HOSPITAL_COMMUNITY): Payer: Medicare Other

## 2023-01-16 ENCOUNTER — Ambulatory Visit (HOSPITAL_COMMUNITY): Payer: Medicare Other

## 2023-01-21 ENCOUNTER — Ambulatory Visit (HOSPITAL_COMMUNITY): Payer: Medicare Other

## 2023-01-23 ENCOUNTER — Other Ambulatory Visit (HOSPITAL_COMMUNITY): Payer: Medicare Other

## 2023-01-23 ENCOUNTER — Ambulatory Visit (HOSPITAL_COMMUNITY): Payer: Medicare Other

## 2023-01-23 ENCOUNTER — Encounter (HOSPITAL_COMMUNITY): Payer: Self-pay

## 2023-01-23 ENCOUNTER — Telehealth (HOSPITAL_COMMUNITY): Payer: Self-pay | Admitting: Cardiovascular Disease

## 2023-01-23 NOTE — Telephone Encounter (Signed)
Just an FYI. We will be removing the patient from the echo WQ.    NO SHOWED x 2 - 12/30/22 and 01/23/23 and cancelled 12/28/22 and 01/28/23- No future follow up with patient to reschedule.    Thank you

## 2023-01-27 ENCOUNTER — Telehealth: Payer: Self-pay | Admitting: Cardiovascular Disease

## 2023-01-27 NOTE — Telephone Encounter (Signed)
Called pt to clarify testing needed.  Pt would like to have echocardiogram scheduled.  Will send message to scheduler to follow up.

## 2023-01-27 NOTE — Telephone Encounter (Signed)
Pt is calling to schedule her EKG, but the EKG request has been cancelled. Pt would like a new order put in to be able to schedule

## 2023-01-28 ENCOUNTER — Other Ambulatory Visit (HOSPITAL_COMMUNITY): Payer: Medicare Other

## 2023-01-28 ENCOUNTER — Ambulatory Visit (HOSPITAL_COMMUNITY): Payer: Medicare Other

## 2023-01-30 ENCOUNTER — Ambulatory Visit (HOSPITAL_COMMUNITY): Payer: Medicare Other

## 2023-02-04 ENCOUNTER — Ambulatory Visit (HOSPITAL_COMMUNITY): Payer: Medicare Other

## 2023-02-04 ENCOUNTER — Telehealth (HOSPITAL_COMMUNITY): Payer: Self-pay

## 2023-02-04 NOTE — Telephone Encounter (Signed)
Called and spoke with pt in regards to PR, pt stated she is unable to reschedule at this time due to hand issues and transportation.   Closed referral

## 2023-02-06 ENCOUNTER — Ambulatory Visit (HOSPITAL_COMMUNITY): Payer: Medicare Other

## 2023-02-11 ENCOUNTER — Ambulatory Visit (HOSPITAL_COMMUNITY): Payer: Medicare Other

## 2023-02-13 ENCOUNTER — Ambulatory Visit (HOSPITAL_COMMUNITY): Payer: Medicare Other

## 2023-02-18 ENCOUNTER — Ambulatory Visit (HOSPITAL_COMMUNITY): Payer: Medicare Other

## 2023-02-20 ENCOUNTER — Ambulatory Visit (HOSPITAL_COMMUNITY): Payer: Medicare Other

## 2023-02-24 ENCOUNTER — Ambulatory Visit (HOSPITAL_COMMUNITY): Payer: Medicare Other | Attending: Cardiovascular Disease

## 2023-02-24 DIAGNOSIS — I272 Pulmonary hypertension, unspecified: Secondary | ICD-10-CM

## 2023-02-24 DIAGNOSIS — I361 Nonrheumatic tricuspid (valve) insufficiency: Secondary | ICD-10-CM | POA: Diagnosis not present

## 2023-02-24 DIAGNOSIS — Z9889 Other specified postprocedural states: Secondary | ICD-10-CM | POA: Diagnosis present

## 2023-02-24 LAB — ECHOCARDIOGRAM COMPLETE
Area-P 1/2: 4.31 cm2
Calc EF: 57.6 %
S' Lateral: 2.8 cm
Single Plane A2C EF: 59.9 %
Single Plane A4C EF: 55.5 %

## 2023-02-25 ENCOUNTER — Ambulatory Visit (HOSPITAL_COMMUNITY): Payer: Medicare Other

## 2023-02-27 ENCOUNTER — Ambulatory Visit (HOSPITAL_COMMUNITY): Payer: Medicare Other

## 2023-05-15 ENCOUNTER — Ambulatory Visit: Payer: Medicare Other

## 2023-05-23 ENCOUNTER — Other Ambulatory Visit: Payer: Self-pay | Admitting: Cardiovascular Disease

## 2023-05-23 DIAGNOSIS — Q211 Atrial septal defect, unspecified: Secondary | ICD-10-CM

## 2023-05-23 DIAGNOSIS — Z01810 Encounter for preprocedural cardiovascular examination: Secondary | ICD-10-CM

## 2023-05-23 NOTE — Telephone Encounter (Signed)
She does not need to continue on this medication for her PFO closure. No need to refill

## 2023-08-22 ENCOUNTER — Emergency Department (HOSPITAL_COMMUNITY)
Admission: EM | Admit: 2023-08-22 | Discharge: 2023-08-23 | Disposition: A | Discharge status: Home / Self Care | Payer: Medicare Other | Attending: Emergency Medicine | Admitting: Emergency Medicine

## 2023-08-22 ENCOUNTER — Emergency Department (HOSPITAL_COMMUNITY): Payer: Medicare Other

## 2023-08-22 ENCOUNTER — Encounter (HOSPITAL_COMMUNITY): Payer: Self-pay

## 2023-08-22 DIAGNOSIS — J45909 Unspecified asthma, uncomplicated: Secondary | ICD-10-CM | POA: Insufficient documentation

## 2023-08-22 DIAGNOSIS — E119 Type 2 diabetes mellitus without complications: Secondary | ICD-10-CM | POA: Insufficient documentation

## 2023-08-22 DIAGNOSIS — R0902 Hypoxemia: Secondary | ICD-10-CM | POA: Diagnosis not present

## 2023-08-22 DIAGNOSIS — R0682 Tachypnea, not elsewhere classified: Secondary | ICD-10-CM | POA: Insufficient documentation

## 2023-08-22 DIAGNOSIS — Z87891 Personal history of nicotine dependence: Secondary | ICD-10-CM | POA: Diagnosis not present

## 2023-08-22 DIAGNOSIS — J449 Chronic obstructive pulmonary disease, unspecified: Secondary | ICD-10-CM | POA: Diagnosis not present

## 2023-08-22 DIAGNOSIS — I10 Essential (primary) hypertension: Secondary | ICD-10-CM | POA: Diagnosis not present

## 2023-08-22 DIAGNOSIS — R000 Tachycardia, unspecified: Secondary | ICD-10-CM | POA: Diagnosis not present

## 2023-08-22 DIAGNOSIS — J4489 Other specified chronic obstructive pulmonary disease: Secondary | ICD-10-CM | POA: Diagnosis not present

## 2023-08-22 DIAGNOSIS — R4182 Altered mental status, unspecified: Secondary | ICD-10-CM | POA: Insufficient documentation

## 2023-08-22 DIAGNOSIS — I272 Pulmonary hypertension, unspecified: Secondary | ICD-10-CM | POA: Diagnosis not present

## 2023-08-22 NOTE — ED Triage Notes (Signed)
Pt arrived from home via [facility] s/p loc. Pt was found by family at 2220 unresponsive in the home. Per family they went out to eat at 1900 pt was at baseline. At dinner pt portable O2 started not working. Per family when returned back home pt was lethargic and had to be pushed up the steps. On site EMS found pt unresponsive with bp 88/40
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
## 2023-08-23 ENCOUNTER — Emergency Department (HOSPITAL_COMMUNITY): Payer: Medicare Other

## 2023-08-23 DIAGNOSIS — R4182 Altered mental status, unspecified: Secondary | ICD-10-CM | POA: Diagnosis not present

## 2023-08-23 LAB — I-STAT VENOUS BLOOD GAS, ED
Acid-Base Excess: 3 mmol/L — ABNORMAL HIGH (ref 0.0–2.0)
Bicarbonate: 29.4 mmol/L — ABNORMAL HIGH (ref 20.0–28.0)
Calcium, Ion: 1.03 mmol/L — ABNORMAL LOW (ref 1.15–1.40)
HCT: 38 % (ref 36.0–46.0)
Hemoglobin: 12.9 g/dL (ref 12.0–15.0)
O2 Saturation: 57 %
Potassium: 4.9 mmol/L (ref 3.5–5.1)
Sodium: 137 mmol/L (ref 135–145)
TCO2: 31 mmol/L (ref 22–32)
pCO2, Ven: 50.6 mm[Hg] (ref 44–60)
pH, Ven: 7.371 (ref 7.25–7.43)
pO2, Ven: 31 mm[Hg] — CL (ref 32–45)

## 2023-08-23 LAB — COMPREHENSIVE METABOLIC PANEL
ALT: 11 U/L (ref 0–44)
AST: 15 U/L (ref 15–41)
Albumin: 3.3 g/dL — ABNORMAL LOW (ref 3.5–5.0)
Alkaline Phosphatase: 81 U/L (ref 38–126)
Anion gap: 13 (ref 5–15)
BUN: 16 mg/dL (ref 8–23)
CO2: 28 mmol/L (ref 22–32)
Calcium: 8.7 mg/dL — ABNORMAL LOW (ref 8.9–10.3)
Chloride: 98 mmol/L (ref 98–111)
Creatinine, Ser: 2.34 mg/dL — ABNORMAL HIGH (ref 0.44–1.00)
GFR, Estimated: 22 mL/min — ABNORMAL LOW (ref >60)
Glucose, Bld: 78 mg/dL (ref 70–99)
Potassium: 3.7 mmol/L (ref 3.5–5.1)
Sodium: 139 mmol/L (ref 135–145)
Total Bilirubin: 0.6 mg/dL (ref 0.3–1.2)
Total Protein: 7.1 g/dL (ref 6.5–8.1)

## 2023-08-23 LAB — CBC
HCT: 31.6 % — ABNORMAL LOW (ref 36.0–46.0)
Hemoglobin: 9 g/dL — ABNORMAL LOW (ref 12.0–15.0)
MCH: 25.5 pg — ABNORMAL LOW (ref 26.0–34.0)
MCHC: 28.5 g/dL — ABNORMAL LOW (ref 30.0–36.0)
MCV: 89.5 fL (ref 80.0–100.0)
Platelets: 294 10*3/uL (ref 150–400)
RBC: 3.53 MIL/uL — ABNORMAL LOW (ref 3.87–5.11)
RDW: 14.8 % (ref 11.5–15.5)
WBC: 11.9 10*3/uL — ABNORMAL HIGH (ref 4.0–10.5)
nRBC: 0 % (ref 0.0–0.2)

## 2023-08-23 LAB — BRAIN NATRIURETIC PEPTIDE: B Natriuretic Peptide: 71.8 pg/mL (ref 0.0–100.0)

## 2023-08-23 LAB — TROPONIN I (HIGH SENSITIVITY)
Troponin I (High Sensitivity): 6 ng/L (ref <18)
Troponin I (High Sensitivity): 15 ng/L (ref <18)

## 2023-08-23 LAB — ETHANOL: Alcohol, Ethyl (B): <10 mg/dL (ref <10)

## 2023-08-23 MED ORDER — ACETAMINOPHEN 500 MG PO TABS
1000.0000 mg | ORAL_TABLET | Freq: Once | ORAL | Status: AC
  Administered 2023-08-23: 1000 mg via ORAL
  Filled 2023-08-23: qty 2

## 2023-08-23 NOTE — Discharge Instructions (Addendum)
You were evaluated in the Emergency Department and after careful evaluation, we did not find any emergent condition requiring admission or further testing in the hospital.  Your exam/testing today was overall reassuring.  Recommend close follow-up with your primary care doctor to discuss her symptoms.  Recommend calling our case management team at [phone] and or our social work team at [phone] for further assistance as we discussed.  Please return to the Emergency Department if you experience any worsening of your condition.  Thank you for allowing [place] to be a part of your care.

## 2023-09-11 ENCOUNTER — Ambulatory Visit: Payer: Medicare Other | Admitting: Internal Medicine

## 2023-09-23 ENCOUNTER — Inpatient Hospital Stay (HOSPITAL_COMMUNITY): Payer: Medicare Other

## 2023-09-23 ENCOUNTER — Other Ambulatory Visit: Payer: Self-pay

## 2023-09-23 ENCOUNTER — Emergency Department (HOSPITAL_COMMUNITY): Payer: Medicare Other

## 2023-09-23 ENCOUNTER — Encounter (HOSPITAL_COMMUNITY): Payer: Self-pay

## 2023-09-23 ENCOUNTER — Inpatient Hospital Stay (HOSPITAL_COMMUNITY): Payer: Medicare Other | Admitting: Anesthesiology

## 2023-09-23 ENCOUNTER — Inpatient Hospital Stay (HOSPITAL_COMMUNITY)
Admission: EM | Admit: 2023-09-23 | Discharge: 2023-10-02 | DRG: 492 | Disposition: A | Discharge status: Home / Self Care | Payer: Medicare Other | Attending: Internal Medicine | Admitting: Internal Medicine

## 2023-09-23 ENCOUNTER — Encounter (HOSPITAL_COMMUNITY)
Admission: EM | Disposition: A | Discharge status: Home / Self Care | Payer: Self-pay | Source: Home / Self Care | Attending: Family Medicine

## 2023-09-23 DIAGNOSIS — Z87892 Personal history of anaphylaxis: Secondary | ICD-10-CM

## 2023-09-23 DIAGNOSIS — E119 Type 2 diabetes mellitus without complications: Secondary | ICD-10-CM

## 2023-09-23 DIAGNOSIS — E538 Deficiency of other specified B group vitamins: Secondary | ICD-10-CM | POA: Diagnosis present

## 2023-09-23 DIAGNOSIS — Z9981 Dependence on supplemental oxygen: Secondary | ICD-10-CM

## 2023-09-23 DIAGNOSIS — Z6834 Body mass index (BMI) 34.0-34.9, adult: Secondary | ICD-10-CM | POA: Diagnosis not present

## 2023-09-23 DIAGNOSIS — J9612 Chronic respiratory failure with hypercapnia: Secondary | ICD-10-CM | POA: Diagnosis present

## 2023-09-23 DIAGNOSIS — Q211 Atrial septal defect, unspecified: Secondary | ICD-10-CM | POA: Diagnosis not present

## 2023-09-23 DIAGNOSIS — G894 Chronic pain syndrome: Secondary | ICD-10-CM | POA: Diagnosis present

## 2023-09-23 DIAGNOSIS — J9809 Other diseases of bronchus, not elsewhere classified: Secondary | ICD-10-CM | POA: Diagnosis present

## 2023-09-23 DIAGNOSIS — Z7982 Long term (current) use of aspirin: Secondary | ICD-10-CM

## 2023-09-23 DIAGNOSIS — Z6835 Body mass index (BMI) 35.0-35.9, adult: Secondary | ICD-10-CM

## 2023-09-23 DIAGNOSIS — D500 Iron deficiency anemia secondary to blood loss (chronic): Secondary | ICD-10-CM | POA: Diagnosis present

## 2023-09-23 DIAGNOSIS — S82401A Unspecified fracture of shaft of right fibula, initial encounter for closed fracture: Secondary | ICD-10-CM

## 2023-09-23 DIAGNOSIS — I2694 Multiple subsegmental pulmonary emboli without acute cor pulmonale: Secondary | ICD-10-CM | POA: Diagnosis present

## 2023-09-23 DIAGNOSIS — Z885 Allergy status to narcotic agent status: Secondary | ICD-10-CM

## 2023-09-23 DIAGNOSIS — S82241A Displaced spiral fracture of shaft of right tibia, initial encounter for closed fracture: Principal | ICD-10-CM | POA: Diagnosis present

## 2023-09-23 DIAGNOSIS — Z86711 Personal history of pulmonary embolism: Secondary | ICD-10-CM

## 2023-09-23 DIAGNOSIS — Z79899 Other long term (current) drug therapy: Secondary | ICD-10-CM

## 2023-09-23 DIAGNOSIS — S93439A Sprain of tibiofibular ligament of unspecified ankle, initial encounter: Secondary | ICD-10-CM | POA: Diagnosis present

## 2023-09-23 DIAGNOSIS — Z888 Allergy status to other drugs, medicaments and biological substances status: Secondary | ICD-10-CM

## 2023-09-23 DIAGNOSIS — M1711 Unilateral primary osteoarthritis, right knee: Secondary | ICD-10-CM | POA: Diagnosis present

## 2023-09-23 DIAGNOSIS — K219 Gastro-esophageal reflux disease without esophagitis: Secondary | ICD-10-CM | POA: Diagnosis present

## 2023-09-23 DIAGNOSIS — D509 Iron deficiency anemia, unspecified: Secondary | ICD-10-CM | POA: Diagnosis present

## 2023-09-23 DIAGNOSIS — W19XXXA Unspecified fall, initial encounter: Secondary | ICD-10-CM | POA: Diagnosis present

## 2023-09-23 DIAGNOSIS — F419 Anxiety disorder, unspecified: Secondary | ICD-10-CM | POA: Diagnosis present

## 2023-09-23 DIAGNOSIS — S82201A Unspecified fracture of shaft of right tibia, initial encounter for closed fracture: Secondary | ICD-10-CM

## 2023-09-23 DIAGNOSIS — Z823 Family history of stroke: Secondary | ICD-10-CM

## 2023-09-23 DIAGNOSIS — Z91013 Allergy to seafood: Secondary | ICD-10-CM

## 2023-09-23 DIAGNOSIS — S82202A Unspecified fracture of shaft of left tibia, initial encounter for closed fracture: Secondary | ICD-10-CM | POA: Diagnosis not present

## 2023-09-23 DIAGNOSIS — Z79891 Long term (current) use of opiate analgesic: Secondary | ICD-10-CM

## 2023-09-23 DIAGNOSIS — M25561 Pain in right knee: Secondary | ICD-10-CM | POA: Diagnosis present

## 2023-09-23 DIAGNOSIS — G4733 Obstructive sleep apnea (adult) (pediatric): Secondary | ICD-10-CM | POA: Diagnosis present

## 2023-09-23 DIAGNOSIS — R7989 Other specified abnormal findings of blood chemistry: Secondary | ICD-10-CM | POA: Diagnosis not present

## 2023-09-23 DIAGNOSIS — I272 Pulmonary hypertension, unspecified: Secondary | ICD-10-CM

## 2023-09-23 DIAGNOSIS — J4489 Other specified chronic obstructive pulmonary disease: Secondary | ICD-10-CM | POA: Diagnosis present

## 2023-09-23 DIAGNOSIS — S82301A Unspecified fracture of lower end of right tibia, initial encounter for closed fracture: Secondary | ICD-10-CM | POA: Diagnosis not present

## 2023-09-23 DIAGNOSIS — J9611 Chronic respiratory failure with hypoxia: Secondary | ICD-10-CM | POA: Diagnosis present

## 2023-09-23 DIAGNOSIS — S82861A Displaced Maisonneuve's fracture of right leg, initial encounter for closed fracture: Secondary | ICD-10-CM | POA: Diagnosis present

## 2023-09-23 DIAGNOSIS — S82191A Other fracture of upper end of right tibia, initial encounter for closed fracture: Secondary | ICD-10-CM

## 2023-09-23 DIAGNOSIS — R339 Retention of urine, unspecified: Secondary | ICD-10-CM | POA: Diagnosis present

## 2023-09-23 DIAGNOSIS — S82874A Nondisplaced pilon fracture of right tibia, initial encounter for closed fracture: Secondary | ICD-10-CM | POA: Diagnosis not present

## 2023-09-23 DIAGNOSIS — Z7902 Long term (current) use of antithrombotics/antiplatelets: Secondary | ICD-10-CM

## 2023-09-23 DIAGNOSIS — I959 Hypotension, unspecified: Secondary | ICD-10-CM

## 2023-09-23 DIAGNOSIS — I10 Essential (primary) hypertension: Secondary | ICD-10-CM

## 2023-09-23 DIAGNOSIS — E669 Obesity, unspecified: Secondary | ICD-10-CM | POA: Diagnosis present

## 2023-09-23 DIAGNOSIS — J9811 Atelectasis: Secondary | ICD-10-CM | POA: Diagnosis not present

## 2023-09-23 DIAGNOSIS — S82831A Other fracture of upper and lower end of right fibula, initial encounter for closed fracture: Secondary | ICD-10-CM | POA: Diagnosis not present

## 2023-09-23 DIAGNOSIS — I2699 Other pulmonary embolism without acute cor pulmonale: Secondary | ICD-10-CM | POA: Diagnosis present

## 2023-09-23 DIAGNOSIS — Z87891 Personal history of nicotine dependence: Secondary | ICD-10-CM

## 2023-09-23 DIAGNOSIS — Z7984 Long term (current) use of oral hypoglycemic drugs: Secondary | ICD-10-CM

## 2023-09-23 DIAGNOSIS — Z91041 Radiographic dye allergy status: Secondary | ICD-10-CM

## 2023-09-23 DIAGNOSIS — Z8774 Personal history of (corrected) congenital malformations of heart and circulatory system: Secondary | ICD-10-CM

## 2023-09-23 DIAGNOSIS — W010XXA Fall on same level from slipping, tripping and stumbling without subsequent striking against object, initial encounter: Secondary | ICD-10-CM | POA: Diagnosis present

## 2023-09-23 DIAGNOSIS — Z8249 Family history of ischemic heart disease and other diseases of the circulatory system: Secondary | ICD-10-CM

## 2023-09-23 HISTORY — PX: TIBIA IM NAIL INSERTION: SHX2516

## 2023-09-23 HISTORY — DX: Cannabis abuse, uncomplicated: F12.10

## 2023-09-23 HISTORY — DX: Unspecified fracture of shaft of right fibula, initial encounter for closed fracture: S82.401A

## 2023-09-23 HISTORY — DX: Opioid use, unspecified, uncomplicated: F11.90

## 2023-09-23 HISTORY — DX: Personal history of other specified conditions: Z87.898

## 2023-09-23 HISTORY — DX: Unspecified fracture of shaft of right tibia, initial encounter for closed fracture: S82.201A

## 2023-09-23 LAB — COMPREHENSIVE METABOLIC PANEL
ALT: 9 U/L (ref 0–44)
ALT: 9 U/L (ref 0–44)
AST: 14 U/L — ABNORMAL LOW (ref 15–41)
AST: 21 U/L (ref 15–41)
Albumin: 3.2 g/dL — ABNORMAL LOW (ref 3.5–5.0)
Albumin: 3.3 g/dL — ABNORMAL LOW (ref 3.5–5.0)
Alkaline Phosphatase: 88 U/L (ref 38–126)
Alkaline Phosphatase: 87 U/L (ref 38–126)
Anion gap: 7 (ref 5–15)
Anion gap: 11 (ref 5–15)
BUN: 7 mg/dL — ABNORMAL LOW (ref 8–23)
BUN: 8 mg/dL (ref 8–23)
CO2: 31 mmol/L (ref 22–32)
CO2: 29 mmol/L (ref 22–32)
Calcium: 8.9 mg/dL (ref 8.9–10.3)
Calcium: 9.1 mg/dL (ref 8.9–10.3)
Chloride: 101 mmol/L (ref 98–111)
Chloride: 100 mmol/L (ref 98–111)
Creatinine, Ser: 0.83 mg/dL (ref 0.44–1.00)
Creatinine, Ser: 0.88 mg/dL (ref 0.44–1.00)
GFR, Estimated: >60 mL/min (ref >60)
GFR, Estimated: >60 mL/min (ref >60)
Glucose, Bld: 103 mg/dL — ABNORMAL HIGH (ref 70–99)
Glucose, Bld: 84 mg/dL (ref 70–99)
Potassium: 3.9 mmol/L (ref 3.5–5.1)
Potassium: 4.6 mmol/L (ref 3.5–5.1)
Sodium: 139 mmol/L (ref 135–145)
Sodium: 140 mmol/L (ref 135–145)
Total Bilirubin: 0.4 mg/dL (ref 0.3–1.2)
Total Bilirubin: 0.6 mg/dL (ref 0.3–1.2)
Total Protein: 7.1 g/dL (ref 6.5–8.1)
Total Protein: 7.3 g/dL (ref 6.5–8.1)

## 2023-09-23 LAB — CBC WITH DIFFERENTIAL/PLATELET
Abs Immature Granulocytes: 0.03 10*3/uL (ref 0.00–0.07)
Basophils Absolute: 0 10*3/uL (ref 0.0–0.1)
Basophils Relative: 0 %
Eosinophils Absolute: 0.3 10*3/uL (ref 0.0–0.5)
Eosinophils Relative: 5 %
HCT: 39.4 % (ref 36.0–46.0)
Hemoglobin: 11.3 g/dL — ABNORMAL LOW (ref 12.0–15.0)
Immature Granulocytes: 1 %
Lymphocytes Relative: 23 %
Lymphs Abs: 1.3 10*3/uL (ref 0.7–4.0)
MCH: 25 pg — ABNORMAL LOW (ref 26.0–34.0)
MCHC: 28.7 g/dL — ABNORMAL LOW (ref 30.0–36.0)
MCV: 87.2 fL (ref 80.0–100.0)
Monocytes Absolute: 0.6 10*3/uL (ref 0.1–1.0)
Monocytes Relative: 10 %
Neutro Abs: 3.4 10*3/uL (ref 1.7–7.7)
Neutrophils Relative %: 61 %
Platelets: 294 10*3/uL (ref 150–400)
RBC: 4.52 MIL/uL (ref 3.87–5.11)
RDW: 14.8 % (ref 11.5–15.5)
WBC: 5.6 10*3/uL (ref 4.0–10.5)
nRBC: 0 % (ref 0.0–0.2)

## 2023-09-23 LAB — HEMOGLOBIN AND HEMATOCRIT, BLOOD
HCT: 38 % (ref 36.0–46.0)
Hemoglobin: 10.9 g/dL — ABNORMAL LOW (ref 12.0–15.0)

## 2023-09-23 LAB — CBC
HCT: 41.4 % (ref 36.0–46.0)
Hemoglobin: 11.9 g/dL — ABNORMAL LOW (ref 12.0–15.0)
MCH: 25.8 pg — ABNORMAL LOW (ref 26.0–34.0)
MCHC: 28.7 g/dL — ABNORMAL LOW (ref 30.0–36.0)
MCV: 89.8 fL (ref 80.0–100.0)
Platelets: 307 10*3/uL (ref 150–400)
RBC: 4.61 MIL/uL (ref 3.87–5.11)
RDW: 15 % (ref 11.5–15.5)
WBC: 8.2 10*3/uL (ref 4.0–10.5)
nRBC: 0 % (ref 0.0–0.2)

## 2023-09-23 LAB — IRON AND TIBC
Iron: 27 ug/dL — ABNORMAL LOW (ref 28–170)
Saturation Ratios: 6 % — ABNORMAL LOW (ref 10.4–31.8)
TIBC: 470 ug/dL — ABNORMAL HIGH (ref 250–450)
UIBC: 443 ug/dL

## 2023-09-23 LAB — RETICULOCYTES
Immature Retic Fract: 16.3 % — ABNORMAL HIGH (ref 2.3–15.9)
RBC.: 4.5 MIL/uL (ref 3.87–5.11)
Retic Count, Absolute: 64.4 10*3/uL (ref 19.0–186.0)
Retic Ct Pct: 1.4 % (ref 0.4–3.1)

## 2023-09-23 LAB — CBG MONITORING, ED: Glucose-Capillary: 77 mg/dL (ref 70–99)

## 2023-09-23 LAB — FOLATE: Folate: 5.9 ng/mL — ABNORMAL LOW (ref >5.9)

## 2023-09-23 LAB — TROPONIN I (HIGH SENSITIVITY): Troponin I (High Sensitivity): 5 ng/L (ref <18)

## 2023-09-23 LAB — GLUCOSE, CAPILLARY
Glucose-Capillary: 71 mg/dL (ref 70–99)
Glucose-Capillary: 118 mg/dL — ABNORMAL HIGH (ref 70–99)
Glucose-Capillary: 126 mg/dL — ABNORMAL HIGH (ref 70–99)

## 2023-09-23 LAB — VITAMIN B12: Vitamin B-12: 481 pg/mL (ref 180–914)

## 2023-09-23 LAB — HIV ANTIBODY (ROUTINE TESTING W REFLEX): HIV Screen 4th Generation wRfx: NONREACTIVE

## 2023-09-23 LAB — TYPE AND SCREEN
ABO/RH(D): O POS
Antibody Screen: NEGATIVE

## 2023-09-23 LAB — FERRITIN: Ferritin: 7 ng/mL — ABNORMAL LOW (ref 11–307)

## 2023-09-23 LAB — SURGICAL PCR SCREEN
MRSA, PCR: NEGATIVE
Staphylococcus aureus: NEGATIVE

## 2023-09-23 LAB — LACTIC ACID, PLASMA: Lactic Acid, Venous: 0.9 mmol/L (ref 0.5–1.9)

## 2023-09-23 SURGERY — INSERTION, INTRAMEDULLARY ROD, TIBIA
Anesthesia: General | Site: Leg Lower | Laterality: Right

## 2023-09-23 MED ORDER — ACETAMINOPHEN 650 MG RE SUPP: 650.0000 mg | Freq: Four times a day (QID) | RECTAL | Status: DC | PRN

## 2023-09-23 MED ORDER — POVIDONE-IODINE 10 % EX SWAB: 2.0000 | Freq: Once | CUTANEOUS | Status: DC

## 2023-09-23 MED ORDER — PROPOFOL 10 MG/ML IV BOLUS
INTRAVENOUS | Status: AC
  Filled 2023-09-23: qty 20

## 2023-09-23 MED ORDER — FENTANYL CITRATE (PF) 250 MCG/5ML IJ SOLN
INTRAMUSCULAR | Status: AC
  Filled 2023-09-23: qty 5

## 2023-09-23 MED ORDER — HYDROMORPHONE HCL 1 MG/ML IJ SOLN: 0.5000 mg | INTRAMUSCULAR | Status: DC | PRN

## 2023-09-23 MED ORDER — MIDAZOLAM HCL 2 MG/2ML IJ SOLN
INTRAMUSCULAR | Status: DC | PRN
  Administered 2023-09-23: 2 mg via INTRAVENOUS

## 2023-09-23 MED ORDER — FENTANYL CITRATE (PF) 100 MCG/2ML IJ SOLN
INTRAMUSCULAR | Status: AC
  Filled 2023-09-23: qty 2

## 2023-09-23 MED ORDER — FENTANYL CITRATE (PF) 250 MCG/5ML IJ SOLN
INTRAMUSCULAR | Status: DC | PRN
  Administered 2023-09-23 (×3): 50 ug via INTRAVENOUS

## 2023-09-23 MED ORDER — SODIUM CHLORIDE 0.9% FLUSH
3.0000 mL | Freq: Two times a day (BID) | INTRAVENOUS | Status: DC
  Administered 2023-09-24 – 2023-10-02 (×17): 3 mL via INTRAVENOUS

## 2023-09-23 MED ORDER — DROPERIDOL 2.5 MG/ML IJ SOLN
0.6250 mg | Freq: Once | INTRAMUSCULAR | Status: DC | PRN
Start: 2023-09-23 — End: 2023-09-23

## 2023-09-23 MED ORDER — LACTATED RINGERS IV SOLN
INTRAVENOUS | Status: DC
  Administered 2023-09-23: 1000 mL via INTRAVENOUS

## 2023-09-23 MED ORDER — LACTATED RINGERS IV SOLN: INTRAVENOUS | Status: DC

## 2023-09-23 MED ORDER — ONDANSETRON HCL 4 MG/2ML IJ SOLN: 4.0000 mg | Freq: Four times a day (QID) | INTRAMUSCULAR | Status: DC | PRN

## 2023-09-23 MED ORDER — ORAL CARE MOUTH RINSE: 15.0000 mL | OROMUCOSAL | Status: DC | PRN

## 2023-09-23 MED ORDER — ONDANSETRON HCL 4 MG PO TABS: 4.0000 mg | ORAL_TABLET | Freq: Four times a day (QID) | ORAL | Status: DC | PRN

## 2023-09-23 MED ORDER — MUPIROCIN 2 % EX OINT
TOPICAL_OINTMENT | CUTANEOUS | Status: AC
  Administered 2023-09-23: 1 via TOPICAL
  Filled 2023-09-23: qty 22

## 2023-09-23 MED ORDER — MUPIROCIN 2 % EX OINT: 1.0000 | TOPICAL_OINTMENT | Freq: Once | CUTANEOUS | Status: AC

## 2023-09-23 MED ORDER — HYDROMORPHONE HCL 1 MG/ML IJ SOLN
0.5000 mg | INTRAMUSCULAR | Status: DC | PRN
  Administered 2023-09-23: 1 mg via INTRAVENOUS
  Filled 2023-09-23: qty 1

## 2023-09-23 MED ORDER — FENTANYL CITRATE PF 50 MCG/ML IJ SOSY
12.5000 ug | PREFILLED_SYRINGE | Freq: Once | INTRAMUSCULAR | Status: AC
  Administered 2023-09-23: 12.5 ug via INTRAVENOUS
  Filled 2023-09-23: qty 1

## 2023-09-23 MED ORDER — APIXABAN 2.5 MG PO TABS
2.5000 mg | ORAL_TABLET | Freq: Two times a day (BID) | ORAL | Status: DC
  Administered 2023-09-24 – 2023-10-02 (×17): 2.5 mg via ORAL
  Filled 2023-09-23 (×17): qty 1

## 2023-09-23 MED ORDER — SUGAMMADEX SODIUM 200 MG/2ML IV SOLN
INTRAVENOUS | Status: DC | PRN
  Administered 2023-09-23: 200 mg via INTRAVENOUS

## 2023-09-23 MED ORDER — CHLORHEXIDINE GLUCONATE 0.12 % MT SOLN
OROMUCOSAL | Status: AC
  Administered 2023-09-23: 15 mL via OROMUCOSAL
  Filled 2023-09-23: qty 15

## 2023-09-23 MED ORDER — DEXTROSE 50 % IV SOLN: 1.0000 | INTRAVENOUS | Status: DC | PRN

## 2023-09-23 MED ORDER — FENTANYL CITRATE PF 50 MCG/ML IJ SOSY
50.0000 ug | PREFILLED_SYRINGE | Freq: Once | INTRAMUSCULAR | Status: AC
  Administered 2023-09-23: 50 ug via INTRAVENOUS
  Filled 2023-09-23: qty 1

## 2023-09-23 MED ORDER — CEFAZOLIN SODIUM-DEXTROSE 2-4 GM/100ML-% IV SOLN
2.0000 g | INTRAVENOUS | Status: AC
  Administered 2023-09-23: 2 g via INTRAVENOUS
  Filled 2023-09-23: qty 100

## 2023-09-23 MED ORDER — SODIUM CHLORIDE 0.9 % IV BOLUS
1000.0000 mL | Freq: Once | INTRAVENOUS | Status: AC
  Administered 2023-09-23: 1000 mL via INTRAVENOUS

## 2023-09-23 MED ORDER — DEXAMETHASONE SODIUM PHOSPHATE 10 MG/ML IJ SOLN
INTRAMUSCULAR | Status: DC | PRN
  Administered 2023-09-23: 10 mg via INTRAVENOUS

## 2023-09-23 MED ORDER — CHLORHEXIDINE GLUCONATE 0.12 % MT SOLN
OROMUCOSAL | Status: AC
  Filled 2023-09-23: qty 15

## 2023-09-23 MED ORDER — PANTOPRAZOLE SODIUM 40 MG PO TBEC
40.0000 mg | DELAYED_RELEASE_TABLET | Freq: Two times a day (BID) | ORAL | Status: DC
  Administered 2023-09-23 – 2023-10-02 (×19): 40 mg via ORAL
  Filled 2023-09-23 (×19): qty 1

## 2023-09-23 MED ORDER — 0.9 % SODIUM CHLORIDE (POUR BTL) OPTIME
TOPICAL | Status: DC | PRN
  Administered 2023-09-23 (×2): 1000 mL

## 2023-09-23 MED ORDER — SODIUM CHLORIDE 0.9% FLUSH: 3.0000 mL | INTRAVENOUS | Status: DC | PRN

## 2023-09-23 MED ORDER — ROCURONIUM BROMIDE 10 MG/ML (PF) SYRINGE
PREFILLED_SYRINGE | INTRAVENOUS | Status: DC | PRN
  Administered 2023-09-23: 20 mg via INTRAVENOUS
  Administered 2023-09-23: 40 mg via INTRAVENOUS

## 2023-09-23 MED ORDER — METOCLOPRAMIDE HCL 5 MG/ML IJ SOLN: 5.0000 mg | Freq: Three times a day (TID) | INTRAMUSCULAR | Status: DC | PRN

## 2023-09-23 MED ORDER — OXYCODONE-ACETAMINOPHEN 5-325 MG PO TABS
2.0000 | ORAL_TABLET | Freq: Four times a day (QID) | ORAL | Status: DC | PRN
  Administered 2023-09-23: 2 via ORAL
  Filled 2023-09-23: qty 2

## 2023-09-23 MED ORDER — FENTANYL CITRATE (PF) 100 MCG/2ML IJ SOLN
25.0000 ug | INTRAMUSCULAR | Status: DC | PRN
  Administered 2023-09-23: 25 ug via INTRAVENOUS
  Administered 2023-09-23: 50 ug via INTRAVENOUS

## 2023-09-23 MED ORDER — FUROSEMIDE 20 MG PO TABS: 40.0000 mg | ORAL_TABLET | Freq: Every morning | ORAL | Status: DC

## 2023-09-23 MED ORDER — CHLORHEXIDINE GLUCONATE 4 % EX SOLN
60.0000 mL | Freq: Once | CUTANEOUS | Status: DC
  Filled 2023-09-23: qty 60

## 2023-09-23 MED ORDER — METOCLOPRAMIDE HCL 5 MG PO TABS: 5.0000 mg | ORAL_TABLET | Freq: Three times a day (TID) | ORAL | Status: DC | PRN

## 2023-09-23 MED ORDER — ALPRAZOLAM 0.5 MG PO TABS
1.0000 mg | ORAL_TABLET | Freq: Three times a day (TID) | ORAL | Status: DC
  Administered 2023-09-23 – 2023-10-02 (×27): 1 mg via ORAL
  Filled 2023-09-23 (×23): qty 2
  Filled 2023-09-23: qty 4
  Filled 2023-09-23 (×3): qty 2

## 2023-09-23 MED ORDER — LIDOCAINE 2% (20 MG/ML) 5 ML SYRINGE
INTRAMUSCULAR | Status: DC | PRN
  Administered 2023-09-23: 80 mg via INTRAVENOUS

## 2023-09-23 MED ORDER — SENNOSIDES-DOCUSATE SODIUM 8.6-50 MG PO TABS
1.0000 | ORAL_TABLET | Freq: Every evening | ORAL | Status: DC | PRN
Start: 2023-09-23 — End: 2023-10-02

## 2023-09-23 MED ORDER — ONDANSETRON HCL 4 MG/2ML IJ SOLN
INTRAMUSCULAR | Status: DC | PRN
  Administered 2023-09-23: 4 mg via INTRAVENOUS

## 2023-09-23 MED ORDER — MIDAZOLAM HCL 2 MG/2ML IJ SOLN
INTRAMUSCULAR | Status: AC
  Filled 2023-09-23: qty 2

## 2023-09-23 MED ORDER — DEXMEDETOMIDINE HCL IN NACL 80 MCG/20ML IV SOLN
INTRAVENOUS | Status: DC | PRN
Start: 2023-09-23 — End: 2023-09-23
  Administered 2023-09-23: 8 ug via INTRAVENOUS

## 2023-09-23 MED ORDER — CARVEDILOL 6.25 MG PO TABS
6.2500 mg | ORAL_TABLET | Freq: Two times a day (BID) | ORAL | Status: DC
  Administered 2023-09-24 – 2023-10-02 (×17): 6.25 mg via ORAL
  Filled 2023-09-23 (×9): qty 1
  Filled 2023-09-23: qty 2
  Filled 2023-09-23 (×7): qty 1

## 2023-09-23 MED ORDER — OXYCODONE-ACETAMINOPHEN 5-325 MG PO TABS
1.0000 | ORAL_TABLET | Freq: Four times a day (QID) | ORAL | Status: DC | PRN
  Administered 2023-09-23 – 2023-09-24 (×2): 2 via ORAL
  Filled 2023-09-23 (×2): qty 2

## 2023-09-23 MED ORDER — SODIUM CHLORIDE 0.9 % IV SOLN
250.0000 mL | INTRAVENOUS | Status: AC | PRN
Start: 2023-09-23 — End: 2023-09-24

## 2023-09-23 MED ORDER — HYDROMORPHONE HCL 1 MG/ML IJ SOLN
1.0000 mg | Freq: Once | INTRAMUSCULAR | Status: AC
  Administered 2023-09-23: 1 mg via INTRAVENOUS
  Filled 2023-09-23: qty 1

## 2023-09-23 MED ORDER — CEFAZOLIN SODIUM-DEXTROSE 1-4 GM/50ML-% IV SOLN
1.0000 g | Freq: Four times a day (QID) | INTRAVENOUS | Status: AC
  Administered 2023-09-23 – 2023-09-24 (×3): 1 g via INTRAVENOUS
  Filled 2023-09-23 (×3): qty 50

## 2023-09-23 MED ORDER — CEFAZOLIN SODIUM-DEXTROSE 2-4 GM/100ML-% IV SOLN
INTRAVENOUS | Status: AC
  Filled 2023-09-23: qty 100

## 2023-09-23 MED ORDER — DOCUSATE SODIUM 100 MG PO CAPS
100.0000 mg | ORAL_CAPSULE | Freq: Two times a day (BID) | ORAL | Status: DC
  Administered 2023-09-24 – 2023-10-02 (×11): 100 mg via ORAL
  Filled 2023-09-23 (×16): qty 1

## 2023-09-23 MED ORDER — ORAL CARE MOUTH RINSE: 15.0000 mL | Freq: Once | OROMUCOSAL | Status: AC

## 2023-09-23 MED ORDER — PHENYLEPHRINE HCL-NACL 20-0.9 MG/250ML-% IV SOLN
INTRAVENOUS | Status: DC | PRN
  Administered 2023-09-23: 40 ug/min via INTRAVENOUS

## 2023-09-23 MED ORDER — PROPOFOL 10 MG/ML IV BOLUS
INTRAVENOUS | Status: DC | PRN
  Administered 2023-09-23: 150 mg via INTRAVENOUS
  Administered 2023-09-23: 50 mg via INTRAVENOUS

## 2023-09-23 MED ORDER — ACETAMINOPHEN 325 MG PO TABS
650.0000 mg | ORAL_TABLET | Freq: Four times a day (QID) | ORAL | Status: DC | PRN
Start: 2023-09-23 — End: 2023-09-23

## 2023-09-23 MED ORDER — SILDENAFIL CITRATE 20 MG PO TABS
20.0000 mg | ORAL_TABLET | Freq: Three times a day (TID) | ORAL | Status: DC
  Administered 2023-09-23 – 2023-10-02 (×27): 20 mg via ORAL
  Filled 2023-09-23 (×30): qty 1

## 2023-09-23 MED ORDER — CARVEDILOL 3.125 MG PO TABS
6.2500 mg | ORAL_TABLET | Freq: Two times a day (BID) | ORAL | Status: DC
  Administered 2023-09-23: 6.25 mg via ORAL
  Filled 2023-09-23: qty 2

## 2023-09-23 MED ORDER — CHLORHEXIDINE GLUCONATE 0.12 % MT SOLN
15.0000 mL | Freq: Once | OROMUCOSAL | Status: AC
Start: 2023-09-23 — End: 2023-09-23

## 2023-09-23 MED ORDER — HYDROMORPHONE HCL 1 MG/ML IJ SOLN
0.5000 mg | INTRAMUSCULAR | Status: DC | PRN
  Administered 2023-09-23 – 2023-09-24 (×3): 1 mg via INTRAVENOUS
  Filled 2023-09-23 (×3): qty 1

## 2023-09-23 MED ORDER — LEVALBUTEROL HCL 0.63 MG/3ML IN NEBU
0.6300 mg | INHALATION_SOLUTION | Freq: Four times a day (QID) | RESPIRATORY_TRACT | Status: DC | PRN
  Administered 2023-09-23 – 2023-10-01 (×2): 0.63 mg via RESPIRATORY_TRACT
  Filled 2023-09-23 (×2): qty 3

## 2023-09-23 MED ORDER — ACETAMINOPHEN 325 MG PO TABS: 325.0000 mg | ORAL_TABLET | Freq: Four times a day (QID) | ORAL | Status: DC | PRN

## 2023-09-23 MED ORDER — SUCCINYLCHOLINE CHLORIDE 200 MG/10ML IV SOSY
PREFILLED_SYRINGE | INTRAVENOUS | Status: DC | PRN
  Administered 2023-09-23: 100 mg via INTRAVENOUS

## 2023-09-23 SURGICAL SUPPLY — 69 items
APL PRP STRL LF 70% ISPRP 2% (MISCELLANEOUS) ×1
APPLICATOR CHLORAPREP 1ML CLR (MISCELLANEOUS) IMPLANT
BAG COUNTER SPONGE SURGICOUNT (BAG) ×1 IMPLANT
BAG SPNG CNTER NS LX DISP (BAG) ×1
BIT DRILL 3-FLUTE 4.2X145 (BIT) IMPLANT
BIT DRILL LONG 4.2 (BIT) IMPLANT
BIT DRILL SHORT 4.2 (BIT) IMPLANT
BLADE SURG 10 STRL SS (BLADE) ×1 IMPLANT
BNDG CMPR MED 10X6 ELC LF (GAUZE/BANDAGES/DRESSINGS) ×1
BNDG ELASTIC 4X5.8 VLCR STR LF (GAUZE/BANDAGES/DRESSINGS) ×1 IMPLANT
BNDG ELASTIC 6X10 VLCR STRL LF (GAUZE/BANDAGES/DRESSINGS) IMPLANT
BNDG ELASTIC 6X5.8 VLCR STR LF (GAUZE/BANDAGES/DRESSINGS) ×1 IMPLANT
BNDG GAUZE DERMACEA FLUFF 4 (GAUZE/BANDAGES/DRESSINGS) IMPLANT
BNDG GZE DERMACEA 4 6PLY (GAUZE/BANDAGES/DRESSINGS) ×1
BRUSH SCRUB EZ PLAIN DRY (MISCELLANEOUS) ×2 IMPLANT
COVER SURGICAL LIGHT HANDLE (MISCELLANEOUS) ×2 IMPLANT
DRAPE C-ARM 42X72 X-RAY (DRAPES) ×1 IMPLANT
DRAPE C-ARMOR (DRAPES) ×1 IMPLANT
DRAPE HALF SHEET 40X57 (DRAPES) IMPLANT
DRAPE INCISE IOBAN 66X45 STRL (DRAPES) IMPLANT
DRAPE U-SHAPE 47X51 STRL (DRAPES) ×1 IMPLANT
DRILL BIT 3-FLUTE 4.2X145 (BIT) ×2
DRILL BIT SHORT 4.2 (BIT)
DRSG ADAPTIC 3X8 NADH LF (GAUZE/BANDAGES/DRESSINGS) ×1 IMPLANT
DRSG MEPITEL 4X7.2 (GAUZE/BANDAGES/DRESSINGS) ×1 IMPLANT
ELECT REM PT RETURN 9FT ADLT (ELECTROSURGICAL) ×1
ELECTRODE REM PT RTRN 9FT ADLT (ELECTROSURGICAL) ×1 IMPLANT
GAUZE PAD ABD 8X10 STRL (GAUZE/BANDAGES/DRESSINGS) ×2 IMPLANT
GAUZE SPONGE 4X4 12PLY STRL (GAUZE/BANDAGES/DRESSINGS) ×1 IMPLANT
GLOVE BIO SURGEON STRL SZ7.5 (GLOVE) ×1 IMPLANT
GLOVE BIO SURGEON STRL SZ8 (GLOVE) ×1 IMPLANT
GLOVE BIO SURGEON STRL SZ8.5 (GLOVE) ×1 IMPLANT
GLOVE BIOGEL PI IND STRL 7.5 (GLOVE) ×1 IMPLANT
GLOVE BIOGEL PI IND STRL 8 (GLOVE) ×1 IMPLANT
GLOVE SURG ORTHO LTX SZ7.5 (GLOVE) ×2 IMPLANT
GOWN STRL REUS W/ TWL LRG LVL3 (GOWN DISPOSABLE) ×2 IMPLANT
GOWN STRL REUS W/ TWL XL LVL3 (GOWN DISPOSABLE) ×1 IMPLANT
GOWN STRL REUS W/TWL LRG LVL3 (GOWN DISPOSABLE) ×2
GOWN STRL REUS W/TWL XL LVL3 (GOWN DISPOSABLE) ×1
KIT BASIN OR (CUSTOM PROCEDURE TRAY) ×1 IMPLANT
KIT TURNOVER KIT B (KITS) ×1 IMPLANT
NAIL TIB TFNA 9X375 (Nail) IMPLANT
NS IRRIG 1000ML POUR BTL (IV SOLUTION) IMPLANT
PACK ORTHO EXTREMITY (CUSTOM PROCEDURE TRAY) ×1 IMPLANT
PAD ARMBOARD 7.5X6 YLW CONV (MISCELLANEOUS) ×2 IMPLANT
PAD CAST 4YDX4 CTTN HI CHSV (CAST SUPPLIES) ×1 IMPLANT
PADDING CAST COTTON 4X4 STRL (CAST SUPPLIES) ×1
PADDING CAST COTTON 6X4 STRL (CAST SUPPLIES) ×1 IMPLANT
PADDING CAST SYNTHETIC 6X4 NS (CAST SUPPLIES) IMPLANT
REAMER ROD DEEP FLUTE 2.5X950 (INSTRUMENTS) IMPLANT
SCREW LOCK IM 68X5X IM NL (Spacer) IMPLANT
SCREW LOCK IM NAIL 5X66 (Screw) IMPLANT
SCREW LOCK IM NAIL 5X68 (Spacer) ×1 IMPLANT
SCREW LOCK IM TI 5X40 (Screw) IMPLANT
SCREW LOCK IM TI 5X42 (Screw) IMPLANT
SPLINT PLASTER CAST FAST 5X30 (CAST SUPPLIES) IMPLANT
STAPLER VISISTAT 35W (STAPLE) ×1 IMPLANT
SUT ETHILON 2 0 FS 18 (SUTURE) ×2 IMPLANT
SUT VIC AB 0 CT1 27 (SUTURE) ×2
SUT VIC AB 0 CT1 27XBRD ANBCTR (SUTURE) IMPLANT
SUT VIC AB 2-0 CT1 27 (SUTURE) ×1
SUT VIC AB 2-0 CT1 TAPERPNT 27 (SUTURE) ×1 IMPLANT
SUT VIC AB CT1 27XBRD ANBCTRL (SUTURE) ×2
TOWEL GREEN STERILE (TOWEL DISPOSABLE) ×2 IMPLANT
TOWEL GREEN STERILE FF (TOWEL DISPOSABLE) ×1 IMPLANT
TRAY CATH INTERMITTENT SS 16FR (CATHETERS) IMPLANT
TRAY FOL W/BAG SLVR 16FR STRL (SET/KITS/TRAYS/PACK) IMPLANT
TRAY FOLEY W/BAG SLVR 16FR LF (SET/KITS/TRAYS/PACK) ×1
YANKAUER SUCT BULB TIP NO VENT (SUCTIONS) IMPLANT

## 2023-09-23 NOTE — ED Notes (Addendum)
I asked secretary to page Dr Caleb Popp so I can notify him of pt's hypotension. I'm giving pt a 500 ml fluid bolus of the LR, until I get a phone call from the dr.

## 2023-09-23 NOTE — ED Notes (Signed)
Notified Dr Caleb Popp of pt's worsening hypotension and obtained new orders.
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
## 2023-09-23 NOTE — ED Provider Notes (Signed)
[facility] Provider Note   CSN: [id number] Arrival date & time: 09/23/23  7829     History  Chief Complaint  Patient presents with   Teresa Wiley    Teresa Wiley is a 67 y.o. female.  Patient with a history of pulmonary hypertension, COPD, sleep apnea on home oxygen, diabetes, hypertension presenting with fall and right leg pain.  States she was trying to walk back from the bathroom when she noticed her oxygen had become unplugged.  She attempted to bend over to fix it and she ended up falling hurting her right lower leg.  States she felt dizzy when bending over and somehow ended up on the ground.  Does not think she lost consciousness but is not sure.  Complains of pain to her right lower leg and ankle.  Did hit her head but denies any head pain.  No blood thinner use.  No neck or back pain.  No chest pain.  No change in her chronic shortness of breath.  No abdominal pain, nausea or vomiting.  No focal weakness, numbness or tingling.  EMS reports deformity to right ankle with shortening and rotation of right lower leg but no hip pain or knee pain.  The history is provided by the patient and the EMS personnel.  Fall Pertinent negatives include no chest pain, no abdominal pain, no headaches and no shortness of breath.       Home Medications Prior to Admission medications   Medication Sig Start Date End Date Taking? Authorizing Provider  ALPRAZolam Prudy Feeler) 1 MG tablet Take 1 mg by mouth 2 (two) times daily as needed for anxiety. 06/06/20   [provider]  amoxicillin (AMOXIL) 500 MG tablet Take 4 tablets (2,000 mg total) by mouth as directed. 1 hour prior to dental work including cleanings Patient not taking: Reported on 12/12/2022 11/07/22   Janetta Hora, PA-C  aspirin EC 81 MG tablet Take 1 tablet (81 mg total) by mouth daily. Swallow whole. 09/02/22   Tonny Bollman, MD  carvedilol (COREG) 6.25 MG tablet TAKE 1 TABLET(6.25  MG) BY MOUTH TWICE DAILY WITH A MEAL 12/25/22   Bensimhon, Bevelyn Buckles, MD  clopidogrel (PLAVIX) 75 MG tablet TAKE 1 TABLET BY MOUTH DAILY BEGINNING 5 DAYS BEFORE PROCEDURE 12/19/22   Tonny Bollman, MD  diclofenac Sodium (VOLTAREN ARTHRITIS PAIN) 1 % GEL Apply 2 g topically in the morning and at bedtime.    [provider]  diphenhydrAMINE (BENADRYL) 25 MG tablet Take 100 mg by mouth at bedtime as needed for sleep.    [provider]  esomeprazole (NEXIUM) 20 MG capsule Take 20 mg by mouth daily before breakfast.    [provider]  furosemide (LASIX) 40 MG tablet Take 40 mg by mouth in the morning.    [provider]  gabapentin (NEURONTIN) 600 MG tablet Take 600 mg by mouth 3 (three) times daily.    [provider]  oxybutynin (DITROPAN) 5 MG tablet Take 5 mg by mouth daily. 10/16/22   [provider]  oxyCODONE-acetaminophen (PERCOCET) 10-325 MG tablet Take 1 tablet by mouth 4 (four) times daily. 06/06/20   [provider]  OXYGEN Inhale 2 L into the lungs continuous.    [provider]  pantoprazole (PROTONIX) 40 MG tablet Take 40 mg by mouth 2 (two) times daily. 05/24/22   [provider]  Semaglutide, 1 MG/DOSE, (OZEMPIC, 1 MG/DOSE,) 4 MG/3ML SOPN Inject 2 mg into the skin  every Wednesday. On 2 mg as of 12/12/2022    [provider]  sildenafil (REVATIO) 20 MG tablet Take 1 tablet (20 mg total) by mouth 3 (three) times daily. 06/14/21   Albertine Grates, MD  Vitamin D, Ergocalciferol, (DRISDOL) 50000 units CAPS capsule Take 50,000 Units by mouth every Wednesday. 01/14/18   [provider]      Allergies    Belbuca [buprenorphine hcl], E.e.s. [erythromycin], Iodine, Morphine and codeine, Shellfish allergy, Shellfish-derived products, and Other    Review of Systems   Review of Systems  Constitutional:  Negative for activity change, appetite change and fever.  HENT:  Negative for congestion and rhinorrhea.    Respiratory:  Negative for cough, chest tightness and shortness of breath.   Cardiovascular:  Negative for chest pain and leg swelling.  Gastrointestinal:  Negative for abdominal pain, nausea and vomiting.  Genitourinary:  Negative for dysuria and hematuria.  Musculoskeletal:  Positive for arthralgias and myalgias.  Skin:  Negative for rash.  Neurological:  Negative for dizziness, weakness and headaches.   all other systems are negative except as noted in the HPI and PMH.    Physical Exam Updated Vital Signs BP 102/70   Pulse 74   Temp 98.5 F (36.9 C) (Oral)   Resp (!) 22   SpO2 90%  Physical Exam Vitals and nursing note reviewed.  Constitutional:      General: She is not in acute distress.    Appearance: She is well-developed.  HENT:     Head: Normocephalic and atraumatic.     Mouth/Throat:     Pharynx: No oropharyngeal exudate.  Eyes:     Conjunctiva/sclera: Conjunctivae normal.     Pupils: Pupils are equal, round, and reactive to light.  Neck:     Comments: No C spine tenderness Cardiovascular:     Rate and Rhythm: Normal rate and regular rhythm.     Heart sounds: Normal heart sounds. No murmur heard. Pulmonary:     Effort: Pulmonary effort is normal. No respiratory distress.     Breath sounds: Normal breath sounds.  Abdominal:     Palpations: Abdomen is soft.     Tenderness: There is no abdominal tenderness. There is no guarding or rebound.  Musculoskeletal:        General: Swelling, tenderness and deformity present.     Cervical back: Normal range of motion.     Comments: External rotation to right lower leg just above the ankle joint.  Intact DP and PT pulse, compartments soft, able to wiggle toes.  No pain with manipulation of right hip, right knee or femur. Full range of motion of left hip, knee and ankle without pain.  Skin:    General: Skin is warm.  Neurological:     Mental Status: She is alert and oriented to person, place, and time.     Cranial  Nerves: No cranial nerve deficit.     Motor: No abnormal muscle tone.     Coordination: Coordination normal.     Comments:  5/5 strength throughout. CN 2-12 intact.Equal grip strength.   Psychiatric:        Behavior: Behavior normal.     ED Results / Procedures / Treatments   Labs (all labs ordered are listed, but only abnormal results are displayed) Labs Reviewed  CBC WITH DIFFERENTIAL/PLATELET - Abnormal; Notable for the following components:      Result Value   Hemoglobin 11.3 (*)    MCH 25.0 (*)  MCHC 28.7 (*)    All other components within normal limits  COMPREHENSIVE METABOLIC PANEL - Abnormal; Notable for the following components:   Glucose, Bld 103 (*)    BUN 7 (*)    Albumin 3.2 (*)    AST 14 (*)    All other components within normal limits  HIV ANTIBODY (ROUTINE TESTING W REFLEX)  COMPREHENSIVE METABOLIC PANEL  CBC  TYPE AND SCREEN  TROPONIN I (HIGH SENSITIVITY)    EKG EKG Interpretation Date/Time:  Tuesday September 23 2023 01:24:56 EDT Ventricular Rate:  82 PR Interval:  167 QRS Duration:  89 QT Interval:  385 QTC Calculation: 450 R Axis:   55  Text Interpretation: Sinus rhythm No significant change was found Confirmed by Glynn Octave [phone]) on 09/23/2023 1:31:31 AM  Radiology DG Knee Complete 4 Views Right  Result Date: 09/23/2023 CLINICAL DATA:  Fall EXAM: RIGHT KNEE - COMPLETE 4+ VIEW COMPARISON:  None Available. FINDINGS: Acute displaced proximal fibular shaft fracture. No dislocation at the knee. Moderate tricompartment arthritis. No significant effusion IMPRESSION: Acute displaced proximal fibular shaft fracture. Electronically Signed   By: Jasmine Pang M.D.   On: 09/23/2023 03:53   DG Tibia/Fibula Right  Result Date: 09/23/2023 CLINICAL DATA:  Fall with pain EXAM: RIGHT TIBIA AND FIBULA - 2 VIEW COMPARISON:  None Available. FINDINGS: Acute mildly comminuted fracture proximal shaft of fibula with apex lateral angulation and slightly greater  than 1/2 shaft diameter lateral and anterior displacement of distal fracture fragment. Additional displaced spiral fracture distal shaft of tibia IMPRESSION: 1. Acute displaced and slightly angulated proximal fibular shaft fracture 2. Acute displaced distal tibial fracture Electronically Signed   By: Jasmine Pang M.D.   On: 09/23/2023 03:52   DG Ankle 2 Views Right  Result Date: 09/23/2023 CLINICAL DATA:  Fall with ankle pain EXAM: RIGHT ANKLE - 2 VIEW COMPARISON:  None Available. FINDINGS: Acute spiral fracture distal shaft of the tibia with about 1/3 shaft diameter lateral and posterior displacement of distal fracture fragment. IMPRESSION: Acute displaced spiral fracture of distal tibial shaft. Electronically Signed   By: Jasmine Pang M.D.   On: 09/23/2023 03:51   DG Hip Unilat W or Wo Pelvis 2-3 Views Right  Result Date: 09/23/2023 CLINICAL DATA:  Pain post fall EXAM: DG HIP (WITH OR WITHOUT PELVIS) 2-3V RIGHT COMPARISON:  None Available. FINDINGS: SI joints are non widened. Pubic symphysis and rami appear intact. No fracture or dislocation. Phleboliths in the pelvis IMPRESSION: No acute osseous abnormality. Electronically Signed   By: Jasmine Pang M.D.   On: 09/23/2023 03:50   DG Chest Portable 1 View  Result Date: 09/23/2023 CLINICAL DATA:  Fall EXAM: PORTABLE CHEST 1 VIEW COMPARISON:  08/23/2023, 12/12/2021, CT 06/24/2022 FINDINGS: Vascular occlusion device at the right cardiac silhouette. Stable mild pleural thickening versus small effusions. Enlarged cardiomediastinal silhouette. Patchy atelectasis and or scarring at the bases. Upper normal mediastinal silhouette but stable. No pneumothorax. IMPRESSION: Stable mild pleural thickening versus small effusions. Patchy atelectasis and or scarring at the bases. Enlarged cardiomediastinal silhouette. Electronically Signed   By: Jasmine Pang M.D.   On: 09/23/2023 03:49   CT Head Wo Contrast  Result Date: 09/23/2023 CLINICAL DATA:  Fall EXAM:  CT HEAD WITHOUT CONTRAST CT CERVICAL SPINE WITHOUT CONTRAST TECHNIQUE: Multidetector CT imaging of the head and cervical spine was performed following the standard protocol without intravenous contrast. Multiplanar CT image reconstructions of the cervical spine were also generated. RADIATION DOSE REDUCTION: This exam was performed according  to the departmental dose-optimization program which includes automated exposure control, adjustment of the mA and/or kV according to patient size and/or use of iterative reconstruction technique. COMPARISON:  08/23/2023 FINDINGS: CT HEAD FINDINGS Brain: There is no mass, hemorrhage or extra-axial collection. The size and configuration of the ventricles and extra-axial CSF spaces are normal. The brain parenchyma is normal, without evidence of acute or chronic infarction. Vascular: No abnormal hyperdensity of the major intracranial arteries or dural venous sinuses. No intracranial atherosclerosis. Skull: The visualized skull base, calvarium and extracranial soft tissues are normal. Sinuses/Orbits: No fluid levels or advanced mucosal thickening of the visualized paranasal sinuses. No mastoid or middle ear effusion. The orbits are normal. CT CERVICAL SPINE FINDINGS Alignment: No static subluxation. Facets are aligned. Occipital condyles are normally positioned. Skull base and vertebrae: No acute fracture. Soft tissues and spinal canal: No prevertebral fluid or swelling. No visible canal hematoma. Disc levels: No advanced spinal canal or neural foraminal stenosis. Upper chest: No pneumothorax, pulmonary nodule or pleural effusion. Other: Normal visualized paraspinal cervical soft tissues. IMPRESSION: 1. No acute intracranial abnormality. 2. No acute fracture or static subluxation of the cervical spine. Electronically Signed   By: Deatra Robinson M.D.   On: 09/23/2023 03:29   CT Cervical Spine Wo Contrast  Result Date: 09/23/2023 CLINICAL DATA:  Fall EXAM: CT HEAD WITHOUT CONTRAST  CT CERVICAL SPINE WITHOUT CONTRAST TECHNIQUE: Multidetector CT imaging of the head and cervical spine was performed following the standard protocol without intravenous contrast. Multiplanar CT image reconstructions of the cervical spine were also generated. RADIATION DOSE REDUCTION: This exam was performed according to the departmental dose-optimization program which includes automated exposure control, adjustment of the mA and/or kV according to patient size and/or use of iterative reconstruction technique. COMPARISON:  08/23/2023 FINDINGS: CT HEAD FINDINGS Brain: There is no mass, hemorrhage or extra-axial collection. The size and configuration of the ventricles and extra-axial CSF spaces are normal. The brain parenchyma is normal, without evidence of acute or chronic infarction. Vascular: No abnormal hyperdensity of the major intracranial arteries or dural venous sinuses. No intracranial atherosclerosis. Skull: The visualized skull base, calvarium and extracranial soft tissues are normal. Sinuses/Orbits: No fluid levels or advanced mucosal thickening of the visualized paranasal sinuses. No mastoid or middle ear effusion. The orbits are normal. CT CERVICAL SPINE FINDINGS Alignment: No static subluxation. Facets are aligned. Occipital condyles are normally positioned. Skull base and vertebrae: No acute fracture. Soft tissues and spinal canal: No prevertebral fluid or swelling. No visible canal hematoma. Disc levels: No advanced spinal canal or neural foraminal stenosis. Upper chest: No pneumothorax, pulmonary nodule or pleural effusion. Other: Normal visualized paraspinal cervical soft tissues. IMPRESSION: 1. No acute intracranial abnormality. 2. No acute fracture or static subluxation of the cervical spine. Electronically Signed   By: Deatra Robinson M.D.   On: 09/23/2023 03:29    Procedures Procedures    Medications Ordered in ED Medications  HYDROmorphone (DILAUDID) injection 1 mg (has no administration in  time range)    ED Course/ Medical Decision Making/ A&P                                 Medical Decision Making Amount and/or Complexity of Data Reviewed Independent Historian: EMS Labs: ordered. Decision-making details documented in ED Course. Radiology: ordered and independent interpretation performed. Decision-making details documented in ED Course. ECG/medicine tests: ordered and independent interpretation performed. Decision-making details documented in ED Course.  Risk Prescription drug management. Decision regarding hospitalization.   Follow-up bending over attempting to fix oxygen tubing.  Possible head injury but no loss of consciousness.  Vital stable.  No distress.  Complains of pain in the right lower leg and ankle.  Neurovascularly intact with no breaks in skin.  Son at bedside.  He heard patient fall but did not witness it.  States she was lying on her right side with her leg underneath her.  Patient believes she was bending over to fix her oxygen tubing when she lost her balance and fell onto her right side.  No preceding dizziness or lightheadedness.  X-ray confirms distal tibia spiral fracture with proximal fibula fracture on the right.  This is consistent with a Maisonneuve fracture. Results reviewed and interpreted by me.   Discussed with Dr. Jena Gauss of orthopedics.  He will plan for IM nail tibia tomorrow.  Recommends n.p.o. and long-leg splint and nonweightbearing.  Admission d/w Dr. Janalyn Shy.         Final Clinical Impression(s) / ED Diagnoses Final diagnoses:  Closed displaced Maisonneuve fracture of right lower extremity, initial encounter  Fall, initial encounter    Rx / DC Orders ED Discharge Orders     None         Barby Colvard, Jeannett Senior, MD 09/23/23 [phone]

## 2023-09-23 NOTE — ED Notes (Signed)
Unsuccessful IV attempt to right hand with 22 gauge IV. Bleeding controlled.

## 2023-09-23 NOTE — Progress Notes (Addendum)
Received multiple pages from nursing that patient's blood pressure was decreasing with a low of 62/41.  Patient seen at bedside and was noted to be at prior baseline.  Patient noted to have normal heart rates in the 70s.  Bilateral radial pulses were 2+.  Left dorsalis pedis pulse of 2+.  Patient appears well-perfused on exam.  Unsure why patient is having hypotension but could be related to Coreg in addition to IV analgesics.  With leg fracture, will need to consider possible hemorrhaging.  Ordered for 1 L normal saline bolus followed by continuation of maintenance IV fluids.  Ordered lactic acid and hemoglobin/hematocrit stat.  Plan was relayed to nurse at bedside.  Plan was relayed to patient at bedside.   Patient has responded well to IV fluids. Coreg reordered to start tomorrow morning.  Jacquelin Hawking, MD [facility] 09/23/2023, 1:40 PM
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
## 2023-09-23 NOTE — ED Triage Notes (Signed)
Pt presents via EMS c/o mechanical fall tripped over 02 line at home. C/o right leg pain from the knee to the ankle. EMS reports obvious deformity to the right ankle. [name] on arrival. Denies LOC during fall. Denies anticoagulation.

## 2023-09-23 NOTE — Op Note (Addendum)
09/23/2023 6:56 PM  PATIENT:  Teresa Wiley 67 y.o.   DATE OF BIRTH: 02-Aug-1956  MEDICAL RECORD NUMBER: [id number]  PRE-OPERATIVE DIAGNOSIS:   1. RIGHT TIBIA FRACTURE 2. RIGHT PROXIMAL FIBULA MAISONNEUVE FRACTURE  POST-OPERATIVE DIAGNOSIS:   1. RIGHT TIBIA FRACTURE 2. RIGHT PROXIMAL FIBULA MAISONNEUVE FRACTURE  PROCEDURE:  Procedure(s): 1. RIGHT TIBIAL SHAFT FRACTURE INTRAMEDULLARY NAILING  with Synthes 9 x 375 mm, statically locked 2. MANUAL APPLICATION OF STRESS UNDER FLUOROSCOPY RIGHT ANKLE  SURGEON:  Surgeon(s) and Role:    Myrene Galas, MD - Primary  ASSISTANTS: Montez Morita, PA-C  ANESTHESIA:   none  EBL:  Minimal   BLOOD ADMINISTERED: None  DRAINS: None   LOCAL MEDICATIONS USED:  NONE  SPECIMEN:  No Specimen  DISPOSITION OF SPECIMEN:  N/A  COUNTS:  YES  TOURNIQUET:  * No tourniquets in log *  DICTATION: .Note written in EPIC  PLAN OF CARE: Admit to inpatient   PATIENT DISPOSITION:  PACU - hemodynamically stable.   Delay start of Pharmacological VTE agent (>24hrs) due to surgical blood loss or risk of bleeding: no  BRIEF SUMMARY AND INDICATIONS FOR PROCEDURE:  Teresa Wiley is a 67 y.o. who sustained a tibia fracture from tripping over her oxygen tubing. Patient denied increasing pain or paresthesia. I also discussed with the patient the risks and benefits of surgery, including the possibility of infection, nerve injury, vessel injury, wound breakdown, arthritis, symptomatic hardware, DVT/ PE, loss of motion, malunion, nonunion, heart attack, stroke, prolonged intubation, and need for further surgery among others. These risks were acknowledged and consent given to proceed.  BRIEF SUMMARY OF PROCEDURE:  The patient was taken to the operating room after administration of Ancef for antibiotics.  The operative extremity was prepped and draped in the usual fashion.  No tourniquet was used during the procedure.  A 2.5-cm incision was made at the base of  the distal pole of patella and extended proximally. A medial parapatellar incision was made, and then the curved cannulated awl advanced into the center of the proximal tibia just medial to the lateral tibial spine and just anterior to the joint surface.  A guidewire was then advanced across the fracture site into the middle of the plafond and checked on AP and LAT images, measuring for nail length on the lateral.  I supplemented reduction with a pointed tenaculum placed percutaneously. We then performed sequential reaming, encountering chatter at 9.5 mm, reaming up to 10 mm and placing a 9 x 375 mm nail. We were careful to watch alignment throughout and make sure distal locking bolts were anterior to the fibula. Two distal locks were placed, one from medial to lateral and the other from anteromedial to posterolateral. Two proximal locks were placed off the jig and checked for position and length.  An assistant was required for the procedure as my assistant performed the reaming and proximal instrumentation while I held reduction.   Because of the spiral proximal fibula fracture consistent with a Maisonneuve pattern, examination of the ankle for syndesmotic injury was indicated.  I manually applied stress under fluoroscopy and did not identify any talar subluxation, medial clear space widening, or syndesmotic widening.  Consequently it was deemed stable.  Standard layered closure was performed. Montez Morita, PA-C assisted during reaming and nail placement, as well as wound closure.  The patient was taken to the PACU in stable condition after application of sterile gently compressive dressings and a splint.  Because of her urinary retention pre-operatively, aggressive hydration,  and unknown fluid status, insertion of a foley catheter was indicated and placed while in the OR.   PROGNOSIS:  The patient will be nonweightbearing with unrestricted motion of the knee and ankle for the next 6 weeks. CAM boot  for support as needed. Resume ECASA and Plavix for DVT prophylaxis. F/u in the office in 10-14 days for removal of sutures.     Doralee Albino. Carola Frost, M.D.

## 2023-09-23 NOTE — Anesthesia Procedure Notes (Signed)
Procedure Name: Intubation Date/Time: 09/23/2023 5:19 PM  Performed by: Rachel Moulds, CRNAPre-anesthesia Checklist: Patient identified, Emergency Drugs available, Suction available, Patient being monitored and Timeout performed Patient Re-evaluated:Patient Re-evaluated prior to induction Oxygen Delivery Method: Circle system utilized Preoxygenation: Pre-oxygenation with 100% oxygen Induction Type: IV induction Laryngoscope Size: Mac and 3 Grade View: Grade II Tube type: Oral Tube size: 7.0 mm Number of attempts: 1 Airway Equipment and Method: Stylet Placement Confirmation: breath sounds checked- equal and bilateral, positive ETCO2, CO2 detector and ETT inserted through vocal cords under direct vision Secured at: 21 cm Tube secured with: Tape Dental Injury: Teeth and Oropharynx as per pre-operative assessment

## 2023-09-23 NOTE — Transfer of Care (Signed)
Immediate Anesthesia Transfer of Care Note  Patient: Teresa Wiley  Procedure(s) Performed: INTRAMEDULLARY (IM) NAIL TIBIAL (Right: Leg Lower)  Patient Location: PACU  Anesthesia Type:General  Level of Consciousness: awake and alert   Airway & Oxygen Therapy: Patient Spontanous Breathing and Patient connected to nasal cannula oxygen  Post-op Assessment: Report given to RN and Post -op Vital signs reviewed and stable  Post vital signs: Reviewed and stable  Last Vitals:  Vitals Value Taken Time  BP 118/71 09/23/23 1924  Temp    Pulse 95 09/23/23 1930  Resp 20 09/23/23 1930  SpO2 94 % 09/23/23 1930  Vitals shown include unfiled device data.  Last Pain:  Vitals:   09/23/23 1923  TempSrc:   PainSc: Asleep         Complications: No notable events documented.

## 2023-09-23 NOTE — ED Notes (Signed)
Splint applied by ortho tech

## 2023-09-23 NOTE — Progress Notes (Signed)
Orthopedic Tech Progress Note Patient Details:  Teresa Wiley [birth year] [id number]  Ortho Devices Type of Ortho Device: Post (long leg) splint, Stirrup splint Ortho Device/Splint Location: rle Ortho Device/Splint Interventions: Ordered, Application, Adjustment  I applied a posterior long leg splint with stirrups. The pt rn held for me while I applied the splint. The pt had pain during splint application. Post Interventions Patient Tolerated: Well Instructions Provided: Care of device, Adjustment of device  [name] Post 09/23/2023, 4:41 AM

## 2023-09-23 NOTE — ED Notes (Signed)
Called ortho tech to place splint.

## 2023-09-23 NOTE — ED Notes (Signed)
I asked the secretary to stat page Dr Caleb Popp. Pt's bp getting lower and fluid bolus not helping. Waiting to hear from dr for further orders.

## 2023-09-23 NOTE — Progress Notes (Addendum)
   Patient seen and examined at bedside, patient admitted after midnight, please see earlier detailed admission note by Tereasa Coop, MD. Briefly, patient presented loss of balance leading to a fall and resultant right-sided le  Subjective: Significant pain of right leg.  BP 119/62   Pulse 74   Temp 98 F (36.7 C) (Oral)   Resp 10   SpO2 100%   General exam: Appears very uncomfortable. Nearly tearful. Moaning. Respiratory system: Clear to auscultation. Respiratory effort normal. Cardiovascular system: S1 & S2 heard, RRR. Gastrointestinal system: Abdomen is nondistended, soft and nontender. No organomegaly or masses felt. Normal bowel sounds heard. Central nervous system: Alert and oriented. Musculoskeletal: Right leg in splint.  Psychiatry: Judgement and insight appear normal. Mood & affect appropriate.   Brief assessment/Plan:  Acute right tibular/fibular fractures Secondary to fall. Orthopedic surgery consulted with plan for surgical repair today. Splint applied. -Continue pain management -Continued orthopedic surgery recommendations  Chronic anemia Unclear etiology per chart review but there is concern for chronic blood loss anemia. Baseline hemoglobin of about 10 g/dL. Hemoglobin stable on admission at 11.3 g/dL. -Anemia panel -Trend CBC post-op  Atrial septal defect s/p closure History of PE Chronic respiratory failure with hypoxia and hypercapnia Pulmonary hypertension Primary hypertension Chronic pain syndrome Diabetes mellitus type 2 Per H&P  Family communication: None at bedside DVT prophylaxis: SCDs Disposition: Discharge home versus SNF likely in 3-4 days pending orthopedic surgery recommendations and eventual PT/OT recommendations  Jacquelin Hawking, MD [facility] 09/23/2023, 7:20 AM

## 2023-09-23 NOTE — ED Notes (Signed)
Pt placed on bedpan by fellow nurse, because pt stated she would like to try

## 2023-09-23 NOTE — Anesthesia Preprocedure Evaluation (Signed)
Anesthesia Evaluation  Patient identified by MRN, date of birth, ID band Patient awake    Reviewed: Allergy & Precautions, NPO status , Patient's Chart, lab work & pertinent test results  Airway Mallampati: II  TM Distance: >3 FB Neck ROM: Full    Dental no notable dental hx.    Pulmonary asthma , sleep apnea , COPD, former smoker   Pulmonary exam normal        Cardiovascular hypertension, Pt. on medications and Pt. on home beta blockers +CHF   Rhythm:Regular Rate:Normal     Neuro/Psych   Anxiety     CVA    GI/Hepatic ,GERD  Medicated,,  Endo/Other  diabetes    Renal/GU      Musculoskeletal  (+) Arthritis , Osteoarthritis,    Abdominal Normal abdominal exam  (+)   Peds  Hematology Lab Results      Component                Value               Date                      WBC                      8.2                 09/23/2023                HGB                      10.9 (L)            09/23/2023                HCT                      38.0                09/23/2023                MCV                      89.8                09/23/2023                PLT                      307                 09/23/2023             Lab Results      Component                Value               Date                      NA                       140                 09/23/2023                K  4.6                 09/23/2023                CO2                      29                  09/23/2023                GLUCOSE                  84                  09/23/2023                BUN                      8                   09/23/2023                CREATININE               0.88                09/23/2023                CALCIUM                  9.1                 09/23/2023                EGFR                     60                  09/02/2022                GFRNONAA                 >60                 09/23/2023               Anesthesia Other Findings   Reproductive/Obstetrics                             Anesthesia Physical Anesthesia Plan  ASA: 3  Anesthesia Plan: General   Post-op Pain Management: Tylenol PO (pre-op)*   Induction: Intravenous  PONV Risk Score and Plan: 3 and Ondansetron, Dexamethasone and Treatment may vary due to age or medical condition  Airway Management Planned: Mask and Oral ETT  Additional Equipment: None  Intra-op Plan:   Post-operative Plan: Extubation in OR  Informed Consent: I have reviewed the patients History and Physical, chart, labs and discussed the procedure including the risks, benefits and alternatives for the proposed anesthesia with the patient or authorized representative who has indicated his/her understanding and acceptance.     Dental advisory given  Plan Discussed with: CRNA  Anesthesia Plan Comments:        Anesthesia Quick Evaluation

## 2023-09-23 NOTE — ED Notes (Signed)
Report received from [facility]. RN. Assumed care of pt at this time.

## 2023-09-23 NOTE — ED Notes (Signed)
Pt states she can't urinate. The bladder scanner is dead and has to charge.

## 2023-09-23 NOTE — ED Notes (Signed)
Pt has 1+ right pedal pulse, cap refill less than 3 sec, able to wiggle toes.

## 2023-09-23 NOTE — Anesthesia Postprocedure Evaluation (Signed)
Anesthesia Post Note  Patient: Teresa Wiley  Procedure(s) Performed: INTRAMEDULLARY (IM) NAIL TIBIAL (Right: Leg Lower)     Patient location during evaluation: PACU Anesthesia Type: General Level of consciousness: awake and alert Pain management: pain level controlled Vital Signs Assessment: post-procedure vital signs reviewed and stable Respiratory status: spontaneous breathing, nonlabored ventilation, respiratory function stable and patient connected to nasal cannula oxygen Cardiovascular status: blood pressure returned to baseline and stable Postop Assessment: no apparent nausea or vomiting Anesthetic complications: no   No notable events documented.  Last Vitals:  Vitals:   09/23/23 2000 09/23/23 2017  BP: 123/75 113/74  Pulse: 91 95  Resp: 17 (!) 26  Temp: 37 C 36.9 C  SpO2: 98%     Last Pain:  Vitals:   09/23/23 2017  TempSrc: Oral  PainSc: 10-Worst pain ever                 [place]

## 2023-09-23 NOTE — ED Triage Notes (Signed)
 Fentanyl given by EMS in route.
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
## 2023-09-23 NOTE — ED Notes (Signed)
Dr. Nettey at bedside 

## 2023-09-23 NOTE — ED Notes (Signed)
Admitting Provider at bedside. 

## 2023-09-23 NOTE — ED Notes (Signed)
Upon arrival to unit, pt's IV occluded. Pt has a 24 gauge IV in left AC. IV not flushing nor drawing blood. IV removed. No infiltration noted.

## 2023-09-23 NOTE — ED Notes (Signed)
Notified Dr Caleb Popp of pt's hypotension and he stated after the bolus will will check pt bp again.

## 2023-09-24 ENCOUNTER — Other Ambulatory Visit (HOSPITAL_COMMUNITY): Payer: Self-pay

## 2023-09-24 ENCOUNTER — Encounter (HOSPITAL_COMMUNITY): Payer: Self-pay | Admitting: Orthopedic Surgery

## 2023-09-24 DIAGNOSIS — M25561 Pain in right knee: Secondary | ICD-10-CM | POA: Diagnosis not present

## 2023-09-24 DIAGNOSIS — W19XXXA Unspecified fall, initial encounter: Secondary | ICD-10-CM | POA: Diagnosis not present

## 2023-09-24 DIAGNOSIS — S82241A Displaced spiral fracture of shaft of right tibia, initial encounter for closed fracture: Secondary | ICD-10-CM | POA: Diagnosis not present

## 2023-09-24 DIAGNOSIS — S82831A Other fracture of upper and lower end of right fibula, initial encounter for closed fracture: Secondary | ICD-10-CM | POA: Diagnosis not present

## 2023-09-24 LAB — COMPREHENSIVE METABOLIC PANEL
ALT: 10 U/L (ref 0–44)
AST: 14 U/L — ABNORMAL LOW (ref 15–41)
Albumin: 3 g/dL — ABNORMAL LOW (ref 3.5–5.0)
Alkaline Phosphatase: 86 U/L (ref 38–126)
Anion gap: 9 (ref 5–15)
BUN: 7 mg/dL — ABNORMAL LOW (ref 8–23)
CO2: 30 mmol/L (ref 22–32)
Calcium: 8.9 mg/dL (ref 8.9–10.3)
Chloride: 101 mmol/L (ref 98–111)
Creatinine, Ser: 0.85 mg/dL (ref 0.44–1.00)
GFR, Estimated: >60 mL/min (ref >60)
Glucose, Bld: 131 mg/dL — ABNORMAL HIGH (ref 70–99)
Potassium: 4.2 mmol/L (ref 3.5–5.1)
Sodium: 140 mmol/L (ref 135–145)
Total Bilirubin: 0.7 mg/dL (ref 0.3–1.2)
Total Protein: 6.6 g/dL (ref 6.5–8.1)

## 2023-09-24 LAB — GLUCOSE, CAPILLARY
Glucose-Capillary: 115 mg/dL — ABNORMAL HIGH (ref 70–99)
Glucose-Capillary: 133 mg/dL — ABNORMAL HIGH (ref 70–99)

## 2023-09-24 LAB — CBC
HCT: 35.9 % — ABNORMAL LOW (ref 36.0–46.0)
Hemoglobin: 10.5 g/dL — ABNORMAL LOW (ref 12.0–15.0)
MCH: 25.4 pg — ABNORMAL LOW (ref 26.0–34.0)
MCHC: 29.2 g/dL — ABNORMAL LOW (ref 30.0–36.0)
MCV: 86.9 fL (ref 80.0–100.0)
Platelets: 285 10*3/uL (ref 150–400)
RBC: 4.13 MIL/uL (ref 3.87–5.11)
RDW: 14.8 % (ref 11.5–15.5)
WBC: 7.3 10*3/uL (ref 4.0–10.5)
nRBC: 0 % (ref 0.0–0.2)

## 2023-09-24 LAB — VITAMIN D 25 HYDROXY (VIT D DEFICIENCY, FRACTURES): Vit D, 25-Hydroxy: 119.98 ng/mL — ABNORMAL HIGH (ref 30–100)

## 2023-09-24 MED ORDER — OXYCODONE HCL 5 MG PO TABS
10.0000 mg | ORAL_TABLET | ORAL | Status: DC | PRN
  Administered 2023-09-24 – 2023-09-25 (×2): 10 mg via ORAL
  Filled 2023-09-24 (×3): qty 2

## 2023-09-24 MED ORDER — FOLIC ACID 1 MG PO TABS
1.0000 mg | ORAL_TABLET | Freq: Every day | ORAL | Status: DC
Start: 2023-09-24 — End: 2023-10-02
  Administered 2023-09-24 – 2023-10-02 (×9): 1 mg via ORAL
  Filled 2023-09-24 (×9): qty 1

## 2023-09-24 MED ORDER — HYDROMORPHONE HCL 1 MG/ML IJ SOLN
0.5000 mg | INTRAMUSCULAR | Status: DC | PRN
  Administered 2023-09-24 – 2023-09-26 (×15): 1 mg via INTRAVENOUS
  Filled 2023-09-24 (×15): qty 1

## 2023-09-24 MED ORDER — POLYETHYLENE GLYCOL 3350 17 G PO PACK
17.0000 g | PACK | Freq: Two times a day (BID) | ORAL | Status: DC
  Administered 2023-09-25 – 2023-10-01 (×6): 17 g via ORAL
  Filled 2023-09-24 (×10): qty 1

## 2023-09-24 MED ORDER — ACETAMINOPHEN 325 MG PO TABS
650.0000 mg | ORAL_TABLET | Freq: Four times a day (QID) | ORAL | Status: AC | PRN
  Administered 2023-09-29 – 2023-09-30 (×2): 650 mg via ORAL
  Filled 2023-09-24 (×2): qty 2

## 2023-09-24 MED ORDER — TRAZODONE HCL 50 MG PO TABS
50.0000 mg | ORAL_TABLET | Freq: Every evening | ORAL | Status: DC | PRN
  Administered 2023-09-25 – 2023-10-02 (×6): 50 mg via ORAL
  Filled 2023-09-24 (×6): qty 1

## 2023-09-24 MED ORDER — FERROUS SULFATE 325 (65 FE) MG PO TABS
325.0000 mg | ORAL_TABLET | ORAL | Status: DC
  Administered 2023-09-24 – 2023-10-02 (×5): 325 mg via ORAL
  Filled 2023-09-24 (×5): qty 1

## 2023-09-24 MED ORDER — ACETAMINOPHEN 500 MG PO TABS
1000.0000 mg | ORAL_TABLET | Freq: Three times a day (TID) | ORAL | Status: AC
  Administered 2023-09-24 – 2023-09-28 (×15): 1000 mg via ORAL
  Filled 2023-09-24 (×15): qty 2

## 2023-09-24 MED ORDER — OXYCODONE HCL 5 MG PO TABS: 5.0000 mg | ORAL_TABLET | ORAL | Status: DC | PRN

## 2023-09-24 MED ORDER — CHLORHEXIDINE GLUCONATE CLOTH 2 % EX PADS
6.0000 | MEDICATED_PAD | Freq: Every day | CUTANEOUS | Status: DC
  Administered 2023-09-23 – 2023-10-02 (×9): 6 via TOPICAL

## 2023-09-24 NOTE — Plan of Care (Signed)

## 2023-09-24 NOTE — Evaluation (Signed)
Physical Therapy Evaluation Patient Details Name: Teresa Wiley MRN: [id number] DOB: August 22, 1956 Today's Date: 09/24/2023  History of Present Illness  Patient is a 67 y/o female admitted 09/23/23 due to fall when she reached down and tripped on oxygen tubing.  Found to have R tibia fx and proximal fibular maisonneuve fx.  She underwent IM nail of tibia fx on same day.  PMH includes chronic hypercapnic respiratory failure due to pulmonary hypertension on chronic home O2, bronchomalacia, ASD s/p percutaneous closure 10/23, PE 2017, HTN, DM II and chronic pain syndrome.  Clinical Impression  Patient presents with decreased mobility due to decreased activity tolerance, pain, decreased strength and decreased balance.  At baseline patient independent though on 2L O2.  She needed +2 min A for getting up to EOB though could not tolerate dangling R LE off EOB to attempt standing today.  Patient demonstrating good upper body strength, however to move herself to and from EOB.  Feel she will likely progress and be able to maintain NWB on R LE, though today too painful.  Hopeful for progress, though if d/c today would need SNF level rehab.  Also needs to find out if she can stay on main floor of her home as her bedroom is upstairs and someone else sleeps in downstairs room.  PT will follow up.       If plan is discharge home, recommend the following: A little help with walking and/or transfers;Assistance with cooking/housework;Assist for transportation;Help with stairs or ramp for entrance;A little help with bathing/dressing/bathroom   Can travel by private vehicle   Yes    Equipment Recommendations Rolling walker (2 wheels);BSC/3in1  Recommendations for Other Services       Functional Status Assessment Patient has had a recent decline in their functional status and demonstrates the ability to make significant improvements in function in a reasonable and predictable amount of time.     Precautions /  Restrictions Precautions Precautions: Fall Required Braces or Orthoses: Splint/Cast Splint/Cast: R lower leg Restrictions Weight Bearing Restrictions: Yes RLE Weight Bearing: Non weight bearing      Mobility  Bed Mobility Overal bed mobility: Needs Assistance Bed Mobility: Supine to Sit, Sit to Supine     Supine to sit: Min assist, +2 for safety/equipment, HOB elevated, Used rails Sit to supine: Min assist, +2 for safety/equipment   General bed mobility comments: greatly increased time, needing rest breaks due to pain, able to scoot herself out and move legs, though not able to bring R LE off EOB due to pain/fear and not allowing PT to assist.  Did help to scoot up in bed.    Transfers                        Ambulation/Gait                  Stairs            Wheelchair Mobility     Tilt Bed    Modified Rankin (Stroke Patients Only)       Balance Overall balance assessment: Needs assistance   Sitting balance-Leahy Scale: Good Sitting balance - Comments: can sit herself up in bed pulling on rails and manages to sit with R leg on bed no UE support       Standing balance comment: states unable to stand today  Pertinent Vitals/Pain Pain Assessment Pain Assessment: 0-10 Pain Score: 10-Worst pain ever Pain Location: R leg and ankle Pain Descriptors / Indicators: Aching Pain Intervention(s): Monitored during session, Repositioned, Premedicated before session    Home Living Family/patient expects to be discharged to:: Private residence Living Arrangements: Spouse/significant other;Children Available Help at Discharge: Family Type of Home: House (townhouse) Home Access: Level entry     Alternate Level Stairs-Number of Steps: flight Home Layout: Two level Home Equipment: Rollator (4 wheels)      Prior Function Prior Level of Function : Independent/Modified Independent                      Extremity/Trunk Assessment   Upper Extremity Assessment Upper Extremity Assessment: Overall WFL for tasks assessed    Lower Extremity Assessment Lower Extremity Assessment: RLE deficits/detail RLE Deficits / Details: ankle in splint, able to wiggle toes, note hip ER and knee flexion so encouraged IR and knee extension, though pt limited due to pain not wanting PT to assist with ROM RLE: Unable to fully assess due to pain       Communication   Communication Communication: No apparent difficulties  Cognition Arousal: Alert Behavior During Therapy: WFL for tasks assessed/performed Overall Cognitive Status: Within Functional Limits for tasks assessed                                          General Comments General comments (skin integrity, edema, etc.): on 5L O2 initially with SpO2 97%, lowered to 2L and SpO2 92% end of session.    Exercises General Exercises - Lower Extremity Quad Sets: AROM, Right, Supine (3 reps)   Assessment/Plan    PT Assessment Patient needs continued PT services  PT Problem List Decreased strength;Pain;Decreased knowledge of precautions;Decreased range of motion;Decreased knowledge of use of DME;Decreased activity tolerance       PT Treatment Interventions DME instruction;Functional mobility training;Balance training;Patient/family education;Therapeutic activities;Gait training;Stair training;Therapeutic exercise    PT Goals (Current goals can be found in the Care Plan section)  Acute Rehab PT Goals Patient Stated Goal: to return to independent PT Goal Formulation: With patient Time For Goal Achievement: 10/08/23 Potential to Achieve Goals: Good    Frequency Min 1X/week     Co-evaluation PT/OT/SLP Co-Evaluation/Treatment: Yes Reason for Co-Treatment: For patient/therapist safety;To address functional/ADL transfers;Other (comment) (Pain) PT goals addressed during session: Mobility/safety with mobility;Strengthening/ROM          AM-PAC PT "6 Clicks" Mobility  Outcome Measure Help needed turning from your back to your side while in a flat bed without using bedrails?: A Lot Help needed moving from lying on your back to sitting on the side of a flat bed without using bedrails?: A Lot Help needed moving to and from a bed to a chair (including a wheelchair)?: Total Help needed standing up from a chair using your arms (e.g., wheelchair or bedside chair)?: Total Help needed to walk in hospital room?: Total Help needed climbing 3-5 steps with a railing? : Total 6 Click Score: 8    End of Session Equipment Utilized During Treatment: Oxygen Activity Tolerance: Patient limited by pain Patient left: in bed;with call bell/phone within reach Nurse Communication: Mobility status PT Visit Diagnosis: Muscle weakness (generalized) (M62.81);Difficulty in walking, not elsewhere classified (R26.2);History of falling (Z91.81);Pain Pain - Right/Left: Right Pain - part of body: Leg    Time: 1610-9604 PT  Time Calculation (min) (ACUTE ONLY): 30 min   Charges:   PT Evaluation $PT Eval Moderate Complexity: 1 Mod   PT General Charges $$ ACUTE PT VISIT: 1 Visit         Sheran Lawless, PT Acute Rehabilitation Services Office:[phone] 09/24/2023   Elray Mcgregor 09/24/2023, 12:01 PM

## 2023-09-24 NOTE — Evaluation (Signed)
Occupational Therapy Evaluation Patient Details Name: Teresa Wiley MRN: [id number] DOB: Apr 23, 1956 Today's Date: 09/24/2023   History of Present Illness Patient is a 67 y/o female admitted 09/23/23 due to fall when she reached down and tripped on oxygen tubing.  Found to have R tibia fx and proximal fibular maisonneuve fx.  She underwent IM nail of tibia fx on same day.  PMH includes chronic hypercapnic respiratory failure due to pulmonary hypertension on chronic home O2, bronchomalacia, ASD s/p percutaneous closure 10/23, PE 2017, HTN, DM II and chronic pain syndrome.   Clinical Impression   Pt was functioning modified independently prior to admission. Presents with R LE pain and anxiety, premedicated prior to session. Pt only able to tolerate sitting with R LE propped on bed which required +2 min assist. Declined attempt to stand. Pt needs set up to total assist for ADLs. VSS on pt's home O2 level of 2L. Patient will benefit from continued inpatient follow up therapy, <3 hours/day.       If plan is discharge home, recommend the following: A lot of help with walking and/or transfers;A lot of help with bathing/dressing/bathroom;Assistance with cooking/housework;Assistance with feeding;Assist for transportation;Help with stairs or ramp for entrance    Functional Status Assessment  Patient has had a recent decline in their functional status and demonstrates the ability to make significant improvements in function in a reasonable and predictable amount of time.  Equipment Recommendations  BSC/3in1;Wheelchair (measurements OT);Wheelchair cushion (measurements OT)    Recommendations for Other Services       Precautions / Restrictions Precautions Precautions: Fall Required Braces or Orthoses: Splint/Cast Splint/Cast: R lower leg Restrictions Weight Bearing Restrictions: Yes RLE Weight Bearing: Non weight bearing      Mobility Bed Mobility Overal bed mobility: Needs Assistance Bed  Mobility: Supine to Sit, Sit to Supine     Supine to sit: Min assist, +2 for safety/equipment, HOB elevated, Used rails Sit to supine: Min assist, +2 for safety/equipment   General bed mobility comments: greatly increased time, needing rest breaks due to pain, able to scoot herself out and move legs, though not able to bring R LE off EOB due to pain/fear and not allowing PT to assist.  Did help to scoot up in bed.    Transfers                          Balance Overall balance assessment: Needs assistance   Sitting balance-Leahy Scale: Good Sitting balance - Comments: can sit herself up in bed pulling on rails and manages to sit with R leg on bed no UE support       Standing balance comment: states unable to stand today                           ADL either performed or assessed with clinical judgement   ADL Overall ADL's : Needs assistance/impaired Eating/Feeding: Independent;Bed level   Grooming: Bed level;Set up   Upper Body Bathing: Moderate assistance;Bed level   Lower Body Bathing: Total assistance;Bed level   Upper Body Dressing : Minimal assistance;Bed level   Lower Body Dressing: Total assistance;Bed level                 General ADL Comments: pt unable to tolerate R LE dangling     Vision Baseline Vision/History: 1 Wears glasses Ability to See in Adequate Light: 0 Adequate Patient Visual Report: No change from  baseline       Perception         Praxis         Pertinent Vitals/Pain Pain Assessment Pain Assessment: Faces Faces Pain Scale: Hurts whole lot Pain Location: R leg and ankle Pain Descriptors / Indicators: Aching, Moaning Pain Intervention(s): Monitored during session, Repositioned, Premedicated before session     Extremity/Trunk Assessment Upper Extremity Assessment Upper Extremity Assessment: Overall WFL for tasks assessed   Lower Extremity Assessment Lower Extremity Assessment: Defer to PT evaluation RLE  Deficits / Details: ankle in splint, able to wiggle toes, note hip ER and knee flexion so encouraged IR and knee extension, though pt limited due to pain not wanting PT to assist with ROM RLE: Unable to fully assess due to pain   Cervical / Trunk Assessment Cervical / Trunk Assessment: Normal   Communication Communication Communication: No apparent difficulties   Cognition Arousal: Alert Behavior During Therapy: Anxious, Flat affect Overall Cognitive Status: Within Functional Limits for tasks assessed                                       General Comments      Exercises     Shoulder Instructions      Home Living Family/patient expects to be discharged to:: Private residence Living Arrangements: Spouse/significant other;Children Available Help at Discharge: Family Type of Home: Other(Comment) (townhouse) Home Access: Level entry     Home Layout: Two level Alternate Level Stairs-Number of Steps: flight Alternate Level Stairs-Rails: Right Bathroom Shower/Tub: Tub/shower unit;Walk-in shower   Bathroom Toilet: Standard     Home Equipment: Rollator (4 wheels)          Prior Functioning/Environment Prior Level of Function : Independent/Modified Independent                        OT Problem List: Decreased strength;Decreased activity tolerance;Impaired balance (sitting and/or standing);Decreased knowledge of use of DME or AE;Pain      OT Treatment/Interventions: Self-care/ADL training;Patient/family education;Therapeutic activities;Balance training;DME and/or AE instruction    OT Goals(Current goals can be found in the care plan section) Acute Rehab OT Goals OT Goal Formulation: With patient Time For Goal Achievement: 10/08/23 Potential to Achieve Goals: Good ADL Goals Pt Will Perform Lower Body Bathing: with mod assist;sitting/lateral leans Pt Will Perform Lower Body Dressing: with mod assist;sitting/lateral leans Pt Will Transfer to  Toilet: with mod assist;stand pivot transfer;bedside commode Pt Will Perform Toileting - Clothing Manipulation and hygiene: with mod assist;sitting/lateral leans Additional ADL Goal #1: Pt will complete bed mobility modified independently in preparation for ADL.  OT Frequency: Min 1X/week    Co-evaluation   Reason for Co-Treatment: For patient/therapist safety;To address functional/ADL transfers;Other (comment) (Pain) PT goals addressed during session: Mobility/safety with mobility;Strengthening/ROM        AM-PAC OT "6 Clicks" Daily Activity     Outcome Measure Help from another person eating meals?: None Help from another person taking care of personal grooming?: A Little Help from another person toileting, which includes using toliet, bedpan, or urinal?: Total Help from another person bathing (including washing, rinsing, drying)?: A Lot Help from another person to put on and taking off regular upper body clothing?: A Little Help from another person to put on and taking off regular lower body clothing?: Total 6 Click Score: 14   End of Session Equipment Utilized During Treatment: Oxygen (2L)  Nurse Communication: Mobility status  Activity Tolerance: Patient limited by pain Patient left: in bed;with call bell/phone within reach;with bed alarm set  OT Visit Diagnosis: Unsteadiness on feet (R26.81);Other abnormalities of gait and mobility (R26.89);Pain                Time: 5284-1324 OT Time Calculation (min): 24 min Charges:  OT General Charges $OT Visit: 1 Visit OT Evaluation $OT Eval Moderate Complexity: 1 Mod  Berna Spare, OTR/L Acute Rehabilitation Services Office: [phone]  Evern Bio 09/24/2023, 2:13 PM

## 2023-09-24 NOTE — TOC CAGE-AID Note (Signed)
Transition of Care [facility]) - CAGE-AID Screening   Patient Details  Name: Teresa Wiley MRN: [id number] Date of Birth: October 27, 1956  [facility]) CM/SW Contact:    Katha Hamming, RN Phone Number: 09/24/2023, 7:58 PM   Clinical Narrative: Reports no drug/alcohol use, no resources indicated.   CAGE-AID Screening:    Have You Ever Felt You Ought to Cut Down on Your Drinking or Drug Use?: No Have People Annoyed You By Critizing Your Drinking Or Drug Use?: No Have You Felt Bad Or Guilty About Your Drinking Or Drug Use?: No Have You Ever Had a Drink or Used Drugs First Thing In The Morning to Steady Your Nerves or to Get Rid of a Hangover?: No CAGE-AID Score: 0  Substance Abuse Education Offered: No

## 2023-09-24 NOTE — Discharge Instructions (Addendum)
Information on my medicine - ELIQUIS (apixaban)  Why was Eliquis prescribed for you? Eliquis was prescribed for you to reduce the risk of blood clots forming after orthopedic surgery.    What do You need to know about Eliquis? Take your Eliquis TWICE DAILY - one tablet in the morning and one tablet in the evening with or without food.  It would be best to take the dose about the same time each day.  If you have difficulty swallowing the tablet whole please discuss with your pharmacist how to take the medication safely.  Take Eliquis exactly as prescribed by your doctor and DO NOT stop taking Eliquis without talking to the doctor who prescribed the medication.  Stopping without other medication to take the place of Eliquis may increase your risk of developing a clot.  After discharge, you should have regular check-up appointments with your healthcare provider that is prescribing your Eliquis.  What do you do if you miss a dose? If a dose of ELIQUIS is not taken at the scheduled time, take it as soon as possible on the same day and twice-daily administration should be resumed.  The dose should not be doubled to make up for a missed dose.  Do not take more than one tablet of ELIQUIS at the same time.  Important Safety Information A possible side effect of Eliquis is bleeding. You should call your healthcare provider right away if you experience any of the following: Bleeding from an injury or your nose that does not stop. Unusual colored urine (red or dark brown) or unusual colored stools (red or black). Unusual bruising for unknown reasons. A serious fall or if you hit your head (even if there is no bleeding).  Some medicines may interact with Eliquis and might increase your risk of bleeding or clotting while on Eliquis. To help avoid this, consult your healthcare provider or pharmacist prior to using any new prescription or non-prescription medications, including herbals,  vitamins, non-steroidal anti-inflammatory drugs (NSAIDs) and supplements.  This website has more information on Eliquis (apixaban): http://www.eliquis.com/eliquis/home     Orthopaedic Trauma Service Discharge Instructions   General Discharge Instructions  Orthopaedic Injuries:  Right tibia fracture treated with intramedullary nailing  WEIGHT BEARING STATUS: Nonweightbearing right leg.  Use walker to mobilize and help maintain nonweightbearing  RANGE OF MOTION/ACTIVITY: Unrestricted range of motion of right knee.  No motion of ankle as you are splinted  Bone health: Labs show elevated vitamin D levels.  Stop taking vitamin D supplementation  Review the following resource for additional information regarding bone health  [email or web address].cy  Wound Care: Keep splint clean and dry.  Do not remove splint until office follow-up  DVT/PE prophylaxis: Eliquis 2.5 mg every 12 hours for 30 days to help prevent blood clots  Diet: as you were eating previously.  Can use over the counter stool softeners and bowel preparations, such as Miralax, to help with bowel movements.  Narcotics can be constipating.  Be sure to drink plenty of fluids  PAIN MEDICATION USE AND EXPECTATIONS  You have likely been given narcotic medications to help control your pain.  After a traumatic event that results in an fracture (broken bone) with or without surgery, it is ok to use narcotic pain medications to help control one's pain.  We understand that everyone responds to pain differently and each individual patient will be evaluated on a regular basis for the continued need for narcotic medications. Ideally, narcotic medication use should last no more than 6-8  weeks (coinciding with fracture healing).   As a patient it is your responsibility as well to monitor narcotic medication use and report the amount and frequency you use these medications when you come to your office visit.   We would also  advise that if you are using narcotic medications, you should take a dose prior to therapy to maximize you participation.  IF YOU ARE ON NARCOTIC MEDICATIONS IT IS NOT PERMISSIBLE TO OPERATE A MOTOR VEHICLE (MOTORCYCLE/CAR/TRUCK/MOPED) OR HEAVY MACHINERY DO NOT MIX NARCOTICS WITH OTHER CNS (CENTRAL NERVOUS SYSTEM) DEPRESSANTS SUCH AS ALCOHOL   POST-OPERATIVE OPIOID TAPER INSTRUCTIONS: It is important to wean off of your opioid medication as soon as possible. If you do not need pain medication after your surgery it is ok to stop day one. Opioids include: Codeine, Hydrocodone(Norco, Vicodin), Oxycodone(Percocet, oxycontin) and hydromorphone amongst others.  Long term and even short term use of opiods can cause: Increased pain response Dependence Constipation Depression Respiratory depression And more.  Withdrawal symptoms can include Flu like symptoms Nausea, vomiting And more Techniques to manage these symptoms Hydrate well Eat regular healthy meals Stay active Use relaxation techniques(deep breathing, meditating, yoga) Do Not substitute Alcohol to help with tapering If you have been on opioids for less than two weeks and do not have pain than it is ok to stop all together.  Plan to wean off of opioids This plan should start within one week post op of your fracture surgery  Maintain the same interval or time between taking each dose and first decrease the dose.  Cut the total daily intake of opioids by one tablet each day Next start to increase the time between doses. The last dose that should be eliminated is the evening dose.    STOP SMOKING OR USING NICOTINE PRODUCTS!!!!  As discussed nicotine severely impairs your body's ability to heal surgical and traumatic wounds but also impairs bone healing.  Wounds and bone heal by forming microscopic blood vessels (angiogenesis) and nicotine is a vasoconstrictor (essentially, shrinks blood vessels).  Therefore, if vasoconstriction  occurs to these microscopic blood vessels they essentially disappear and are unable to deliver necessary nutrients to the healing tissue.  This is one modifiable factor that you can do to dramatically increase your chances of healing your injury.    (This means no smoking, no nicotine gum, patches, etc)  DO NOT USE NONSTEROIDAL ANTI-INFLAMMATORY DRUGS (NSAID'S)  Using products such as Advil (ibuprofen), Aleve (naproxen), Motrin (ibuprofen) for additional pain control during fracture healing can delay and/or prevent the healing response.  If you would like to take over the counter (OTC) medication, Tylenol (acetaminophen) is ok.  However, some narcotic medications that are given for pain control contain acetaminophen as well. Therefore, you should not exceed more than 4000 mg of tylenol in a day if you do not have liver disease.  Also note that there are may OTC medicines, such as cold medicines and allergy medicines that my contain tylenol as well.  If you have any questions about medications and/or interactions please ask your doctor/PA or your pharmacist.      ICE AND ELEVATE INJURED/OPERATIVE EXTREMITY  Using ice and elevating the injured extremity above your heart can help with swelling and pain control.  Icing in a pulsatile fashion, such as 20 minutes on and 20 minutes off, can be followed.    Do not place ice directly on skin. Make sure there is a barrier between to skin and the ice pack.  Using frozen items such as frozen peas works well as the conform nicely to the are that needs to be iced.  USE AN ACE WRAP OR TED HOSE FOR SWELLING CONTROL  In addition to icing and elevation, Ace wraps or TED hose are used to help limit and resolve swelling.  It is recommended to use Ace wraps or TED hose until you are informed to stop.    When using Ace Wraps start the wrapping distally (farthest away from the body) and wrap proximally (closer to the body)   Example: If you had surgery on your leg or  thing and you do not have a splint on, start the ace wrap at the toes and work your way up to the thigh        If you had surgery on your upper extremity and do not have a splint on, start the ace wrap at your fingers and work your way up to the upper arm  IF YOU ARE IN A SPLINT OR CAST DO NOT REMOVE IT FOR ANY REASON   If your splint gets wet for any reason please contact the office immediately. You may shower in your splint or cast as long as you keep it dry.  This can be done by wrapping in a cast cover or garbage back (or similar)  Do Not stick any thing down your splint or cast such as pencils, money, or hangers to try and scratch yourself with.  If you feel itchy take benadryl as prescribed on the bottle for itching  IF YOU ARE IN A CAM BOOT (BLACK BOOT)  You may remove boot periodically. Perform daily dressing changes as noted below.  Wash the liner of the boot regularly and wear a sock when wearing the boot. It is recommended that you sleep in the boot until told otherwise    Call office for the following: Temperature greater than 101F Persistent nausea and vomiting Severe uncontrolled pain Redness, tenderness, or signs of infection (pain, swelling, redness, odor or green/yellow discharge around the site) Difficulty breathing, headache or visual disturbances Hives Persistent dizziness or light-headedness Extreme fatigue Any other questions or concerns you may have after discharge  In an emergency, call 911 or go to an Emergency Department at a nearby hospital  HELPFUL INFORMATION  If you had a block, it will wear off between 8-24 hrs postop typically.  This is period when your pain may go from nearly zero to the pain you would have had postop without the block.  This is an abrupt transition but nothing dangerous is happening.  You may take an extra dose of narcotic when this happens.  You should wean off your narcotic medicines as soon as you are able.  Most patients will be off  or using minimal narcotics before their first postop appointment.   We suggest you use the pain medication the first night prior to going to bed, in order to ease any pain when the anesthesia wears off. You should avoid taking pain medications on an empty stomach as it will make you nauseous.  Do not drink alcoholic beverages or take illicit drugs when taking pain medications.  In most states it is against the law to drive while you are in a splint or sling.  And certainly against the law to drive while taking narcotics.  You may return to work/school in the next couple of days when you feel up to it.   Pain medication may make you constipated.  Below  are a few solutions to try in this order: Decrease the amount of pain medication if you aren't having pain. Drink lots of decaffeinated fluids. Drink prune juice and/or each dried prunes  If the first 3 don't work start with additional solutions Take Colace - an over-the-counter stool softener Take Senokot - an over-the-counter laxative Take Miralax - a stronger over-the-counter laxative     CALL THE OFFICE WITH ANY QUESTIONS OR CONCERNS: [phone]   VISIT OUR WEBSITE FOR ADDITIONAL INFORMATION: [email or web address]

## 2023-09-24 NOTE — TOC Benefit Eligibility Note (Signed)
Patient Product/process development scientist completed.    The patient is insured through [employer]. Patient has Medicare and is not eligible for a copay card, but may be able to apply for patient assistance, if available.    Ran test claim for Eliquis 2.5 mg and the current 30 day co-pay is $4.60.   This test claim was processed through [facility]- copay amounts may vary at other pharmacies due to pharmacy/plan contracts, or as the patient moves through the different stages of their insurance plan.     Roland Earl, CPHT Pharmacy Technician III Certified Patient Advocate [facility] Patient Advocate Team Direct Number: [phone]  Fax: [phone]

## 2023-09-24 NOTE — TOC Initial Note (Signed)
Transition of Care [facility]) - Initial/Assessment Note    Patient Details  Name: Teresa Wiley MRN: [id number] Date of Birth: 02-14-1956  [facility]) CM/SW Contact:    Kingsley Plan, RN Phone Number: 09/24/2023, 1:51 PM  Clinical Narrative:                  Spoke with patient at bedside.   Per PT patient could not maintain NWB on RLE today due to pain. Recommendation today is SNF, however patient may progress to and be able to maintain NWB.   Discussed with patient.   Patient asking if she went home if son could be a paid caregiver for her at home. He lives close by and helps her but he has a wife and children also. NCM explained NCM will complete Medicaid PCS paperwork and ask hospital attending to sign. However, it takes awhile to get approved and not sure if they would approve son to be caregiver, usually they set up with an agency. Patient voiced understanding.   Patient states she has Medicaid and Medicare.   If patient goes home at discharge HHPT would come about twice a week for a hour at a time. They do not stay for long periods or assist with meal pre or house cleaning.   If she goes home at discharge PT recommending bedside commode and walker.  If patient decides to go to SNF at discahrge, SW would fax FL2 to surrending areas and provide patient and her family with SNF beds offers with medicare ratings. Patient has never been to a SNF before.   Patient will discuss with family tonight.   TOC will follow up with her in the morning.   In mean time NCM will complete Medicaid PCS paperwork and ask Dr Lowell Guitar if he will sign    Expected Discharge Plan:  (see note) Barriers to Discharge: Continued Medical Work up   Patient Goals and CMS Choice   CMS Medicare.gov Compare Post Acute Care list provided to:: Patient Choice offered to / list presented to : Patient [place] ownership interest in [facility].provided to:: Patient    Expected  Discharge Plan and Services   Discharge Planning Services: CM Consult Post Acute Care Choice: Home Health Living arrangements for the past 2 months: Single Family Home                 DME Arranged:  (see note)         HH Arranged:  (see note)          Prior Living Arrangements/Services Living arrangements for the past 2 months: Single Family Home   Patient language and need for interpreter reviewed:: Yes        Need for Family Participation in Patient Care: Yes (Comment) Care giver support system in place?:  (patient wants to talk with family)   Criminal Activity/Legal Involvement Pertinent to Current Situation/Hospitalization: No - Comment as needed  Activities of Daily Living   ADL Screening (condition at time of admission) Independently performs ADLs?: No Does the patient have a NEW difficulty with bathing/dressing/toileting/self-feeding that is expected to last >3 days?: Yes (Initiates electronic notice to provider for possible OT consult) Does the patient have a NEW difficulty with getting in/out of bed, walking, or climbing stairs that is expected to last >3 days?: Yes (Initiates electronic notice to provider for possible PT consult) Does the patient have a NEW difficulty with communication that is expected to last >3 days?:  No Is the patient deaf or have difficulty hearing?: Yes Does the patient have difficulty seeing, even when wearing glasses/contacts?: Yes Does the patient have difficulty concentrating, remembering, or making decisions?: Yes  Permission Sought/Granted   Permission granted to share information with : No              Emotional Assessment Appearance:: Appears stated age Attitude/Demeanor/Rapport: Engaged Affect (typically observed): Accepting Orientation: : Oriented to Self, Oriented to Place, Oriented to  Time, Oriented to Situation Alcohol / Substance Use: Not Applicable Psych Involvement: No (comment)  Admission diagnosis:  Fall  [W19.XXXA] Fall, initial encounter [id number]] Closed displaced [place] fracture of right lower extremity, initial encounter [S82.861A] Patient Active Problem List   Diagnosis Date Noted   Closed right tibial fracture 09/23/2023   Closed right fibular fracture 09/23/2023   Bronchomalacia 09/23/2023   Fall 09/23/2023   Physical deconditioning 12/12/2022   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) 09/13/2022   ASD (atrial septal defect) status post closure October 2023    Syncope and collapse 06/24/2022   Vomiting and diarrhea    Allergy to contrast media (used for diagnostic x-rays)    OSA (obstructive sleep apnea) 08/24/2021   PND (post-nasal drip) 06/07/2021   Chest pain 07/19/2020   Severe sepsis with acute organ dysfunction (HCC) 07/01/2020   Acute on chronic diastolic CHF (congestive heart failure) (HCC) 02/09/2019   AKI (acute kidney injury) (HCC) 02/09/2019   Pulmonary embolism (HCC)in 2017 02/09/2019   GERD (gastroesophageal reflux disease) 02/09/2019   Chronic hypoxic respiratory failure (HCC) 02/09/2019   Encephalopathy, toxic 12/07/2018   Encephalopathy 12/06/2018   Recurrent syncope 11/17/2018   Polypharmacy 11/17/2018   Non-insulin dependent type 2 diabetes mellitus (HCC) 01/10/2016   ARF (acute renal failure) (HCC) 01/10/2016   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   Cerebral infarction (HCC)    Narcotic dependence (HCC) 10/18/2014   Chronic pain syndrome 10/18/2014   Diabetes type 2, controlled (HCC)    Pulmonary hypertension (HCC)    Syncope, vasovagal 07/19/2014   S/P cholecystectomy 07/19/2014   Abnormal EKG 07/19/2014   Sludge in gallbladder 03/09/2014   Steatohepatitis, 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/2014   Tobacco abuse 03/09/2014   Right-sided chest wall pain 03/09/2014   Right flank pain 03/09/2014   Right lateral abdominal pain (RUQ & RLQ) 03/09/2014   Urinary incontinence in female 03/09/2014   Coccydynia 03/09/2014   Nocturnal hypoxemia due  to obesity 03/09/2014   DM (diabetes mellitus) (HCC) 03/09/2014   Essential hypertension 03/09/2014   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 [place] [address] Phone: [phone] Fax: [phone]  [facility] [address] [place], [place] - [address] [place] [address] Phone: [phone] Fax: [phone]  [facility] [address], [place] - [address] [place] [address] Phone: [phone] Fax: [phone]  [facility] [address] [place] [address] Phone: [phone] Fax: [phone]     Social Determinants of Health (SDOH) Social History: SDOH Screenings   Food Insecurity: No Food  Insecurity (09/23/2023)  Housing: Medium Risk (09/23/2023)  Transportation Needs: No Transportation Needs (09/23/2023)  Utilities: Not At Risk (09/23/2023)  Social Connections: Unknown (06/26/2022)   [facility]  Tobacco Use: Medium Risk (09/23/2023)   SDOH Interventions:     Readmission Risk Interventions     No data to display

## 2023-09-24 NOTE — Progress Notes (Signed)
PROGRESS NOTE    Teresa Wiley  NAT:[id number] DOB: Apr 03, 1956 DOA: 09/23/2023 PCP: Maye Hides, PA  Chief Complaint  Patient presents with   Fall    Brief Narrative:   Teresa Wiley is Teresa Wiley 67 y.o. female with medical history significant of chronic hypoxic and hypercapnic respiratory failure secondary to pulmonary hypertension, bronchomalacia, ASD status post percutaneous closure October 2023, PE in 2017, essential hypertension, DM type II and chronic pain syndrome presented to emergency department after Teresa Wiley fall and complaining of right-sided leg pain. States she was trying to walk back from the bathroom when she noticed her oxygen had become unplugged. She attempted to bend over to fix it and she ended up falling hurting her right lower leg. States she felt dizzy when bending over and somehow ended up on the ground. Does not think she lost consciousness but is not sure. Complains of pain to her right lower leg and ankle. Did hit her head but denies any head pain.  At this moment patient denies any chest pain, chronic changes of her shortness of breath, abdominal pain, nausea, vomiting, constipation, diarrhea, generalized weakness, focal weakness, numbness and tingling.    Assessment & Plan:   Principal Problem:   Fall Active Problems:   Closed right tibial fracture   Closed right fibular fracture   Essential hypertension   Chronic pain syndrome   Pulmonary hypertension (HCC)   Non-insulin dependent type 2 diabetes mellitus (HCC)   Pulmonary embolism (HCC)in 2017   Chronic hypoxic respiratory failure (HCC)   ASD (atrial septal defect) status post closure October 2023   Bronchomalacia  Acute right tibular/fibular fractures Mechnical Fall  Secondary to fall S/p R tibial shaft fracture IM nailing with synthes 9x375 mm statically locked, manual application of stress under fluoroscopy R ankle Pain control, bowel regimen Appreciate ortho recs, note pending today NWB to RLE,  CAM boot for support prn  Eliquis ordered per ortho for dvt ppx   Chronic anemia Folate Deficiency Iron Deficiency Unclear etiology per chart review but there is concern for chronic blood loss anemia. Baseline hemoglobin of about 10 g/dL.  Low iron, folate - normal B12 Start folate and iron supplementation - will consider IV iron while admitted Needs additional workup of iron def anemia, concern for chronic blood loss anemia outpatient   Atrial septal defect s/p closure Coreg 6.125 mg BID, currently on aspirin monotherapy (completed 6 months of DAPT with aspirin/plavix)  History of PE Not on anticoagulation   Chronic respiratory failure with hypoxia and hypercapnia Pulmonary hypertension Currently on 4 L [place] (she's on 3 L at baseline at home) Sildenafil 20 mg TID Goal O2 sat over 94%  Primary hypertension Coreg Lasix currently on hold Had hypotension yesterday afternoon requiring IVF  Chronic pain syndrome Adjust meds as needed post op  Diabetes mellitus type 2 A1c 5.7    DVT prophylaxis: eliquis Code Status: full Family Communication: none Disposition:   Status is: Inpatient Remains inpatient appropriate because: need for continued inpatient care/pain control   Consultants:  orthopedics  Procedures:  PROCEDURE:  Procedure(s): 1. RIGHT TIBIAL SHAFT FRACTURE INTRAMEDULLARY NAILING  with Synthes 9 x 375 mm, statically locked 2. MANUAL APPLICATION OF STRESS UNDER FLUOROSCOPY RIGHT ANKLE  Antimicrobials:  Anti-infectives (From admission, onward)    Start     Dose/Rate Route Frequency Ordered Stop   09/24/23 0600  ceFAZolin (ANCEF) IVPB 2g/100 mL premix        2 g 200 mL/hr over 30 Minutes Intravenous On call  to O.R. 09/23/23 1439 09/23/23 1727   09/24/23 0000  ceFAZolin (ANCEF) IVPB 1 g/50 mL premix        1 g 100 mL/hr over 30 Minutes Intravenous Every 6 hours 09/23/23 2000 09/24/23 1332   09/23/23 1440  ceFAZolin (ANCEF) 2-4 GM/100ML-% IVPB       Note to  Pharmacy: Lacie Draft [name]: cabinet override      09/23/23 1440 09/23/23 1730       Subjective: C/o leg pain since this morning  Objective: Vitals:   09/24/23 0311 09/24/23 0720 09/24/23 1120 09/24/23 1515  BP:  123/85 107/73 125/79  Pulse:      Resp:  18 18   Temp: 98.5 F (36.9 C) 98.7 F (37.1 C) 98.2 F (36.8 C) 98.5 F (36.9 C)  TempSrc: Oral Oral Oral Oral  SpO2:  99% 97%   Weight: 108.7 kg     Height:        Intake/Output Summary (Last 24 hours) at 09/24/2023 1747 Last data filed at 09/24/2023 1610 Gross per 24 hour  Intake 1190 ml  Output 500 ml  Net 690 ml   Filed Weights   09/23/23 1557 09/24/23 0311  Weight: 108.3 kg 108.7 kg    Examination:  General exam: Appears tearful and uncomfortable due to pain Respiratory system: unlabored Cardiovascular system: RRR Central nervous system: Alert and oriented. No focal neurological deficits. Extremities: splint to RLE    Data Reviewed: I have personally reviewed following labs and imaging studies  CBC: Recent Labs  Lab 09/23/23 0313 09/23/23 0542 09/23/23 1240 09/24/23 0246  WBC 5.6 8.2  --  7.3  NEUTROABS 3.4  --   --   --   HGB 11.3* 11.9* 10.9* 10.5*  HCT 39.4 41.4 38.0 35.9*  MCV 87.2 89.8  --  86.9  PLT 294 307  --  285    Basic Metabolic Panel: Recent Labs  Lab 09/23/23 0313 09/23/23 0542 09/24/23 0246  NA 139 140 140  K 3.9 4.6 4.2  CL 101 100 101  CO2 31 29 30   GLUCOSE 103* 84 131*  BUN 7* 8 7*  CREATININE 0.83 0.88 0.85  CALCIUM 8.9 9.1 8.9    GFR: Estimated Creatinine Clearance: 86.9 mL/min (by C-G formula based on SCr of 0.85 mg/dL).  Liver Function Tests: Recent Labs  Lab 09/23/23 0313 09/23/23 0542 09/24/23 0246  AST 14* 21 14*  ALT 9 9 10   ALKPHOS 88 87 86  BILITOT 0.4 0.6 0.7  PROT 7.1 7.3 6.6  ALBUMIN 3.2* 3.3* 3.0*    CBG: Recent Labs  Lab 09/23/23 1634 09/23/23 1925 09/23/23 2319 09/24/23 0638 09/24/23 1122  GLUCAP 71 118* 126* 115* 133*      Recent Results (from the past 240 hour(s))  Surgical pcr screen     Status: None   Collection Time: 09/23/23  2:41 PM   Specimen: Nasal Mucosa; Nasal Swab  Result Value Ref Range Status   MRSA, PCR NEGATIVE NEGATIVE Final   Staphylococcus aureus NEGATIVE NEGATIVE Final    Comment: (NOTE) The Xpert SA Assay (FDA approved for NASAL specimens in patients 75 years of age and older), is one component of Arnetia Bronk comprehensive surveillance program. It is not intended to diagnose infection nor to guide or monitor treatment. Performed at [facility], [address], [place], [place] [address]          Radiology Studies: DG Tibia/Fibula Right Port  Result Date: 09/23/2023 CLINICAL DATA:  Postop. EXAM:  PORTABLE RIGHT TIBIA AND FIBULA - 2 VIEW; RIGHT ANKLE - COMPLETE 3+ VIEW COMPARISON:  Preoperative imaging. FINDINGS: Tibia/fibula: Tibial intramedullary nail proximal and distal locking screw fixation traverse distal tibial fracture. Improved fracture alignment from preoperative imaging. Proximal fibular shaft fracture is also in unchanged alignment. Overlying splint material distally. Ankle: The ankle mortise is preserved. There may be Meighan Treto tiny chip fracture from the distal medial malleolus. Detailed assessment is limited by overlying soft tissue edema. IMPRESSION: 1. ORIF of distal tibial fracture with improved alignment from preoperative imaging. 2. Proximal fibular shaft fracture in unchanged alignment. 3. Possible tiny chip fracture from the distal medial malleolus. Electronically Signed   By: Narda Rutherford M.D.   On: 09/23/2023 21:46   DG Ankle Complete Right  Result Date: 09/23/2023 CLINICAL DATA:  Postop. EXAM: PORTABLE RIGHT TIBIA AND FIBULA - 2 VIEW; RIGHT ANKLE - COMPLETE 3+ VIEW COMPARISON:  Preoperative imaging. FINDINGS: Tibia/fibula: Tibial intramedullary nail proximal and distal locking screw fixation traverse distal tibial fracture. Improved fracture alignment from  preoperative imaging. Proximal fibular shaft fracture is also in unchanged alignment. Overlying splint material distally. Ankle: The ankle mortise is preserved. There may be Trine Fread tiny chip fracture from the distal medial malleolus. Detailed assessment is limited by overlying soft tissue edema. IMPRESSION: 1. ORIF of distal tibial fracture with improved alignment from preoperative imaging. 2. Proximal fibular shaft fracture in unchanged alignment. 3. Possible tiny chip fracture from the distal medial malleolus. Electronically Signed   By: Narda Rutherford M.D.   On: 09/23/2023 21:46   DG Tibia/Fibula Right  Result Date: 09/23/2023 CLINICAL DATA:  Fracture. EXAM: RIGHT TIBIA AND FIBULA - 2 VIEW COMPARISON:  Preoperative imaging. FINDINGS: Fourteen fluoroscopic spot views of the right tibia and fibula obtained in the operating room. Imaging obtained during tibial intramedullary nail proximal and distal locking screw fixation traversing distal tibial fracture. Fluoroscopy time 1 minutes 7 seconds. Dose 3.02 mGy. IMPRESSION: Intraoperative fluoroscopy during tibial fracture fixation. Electronically Signed   By: Narda Rutherford M.D.   On: 09/23/2023 21:45   DG C-Arm 1-60 Min-No Report  Result Date: 09/23/2023 Fluoroscopy was utilized by the requesting physician.  No radiographic interpretation.   DG C-Arm 1-60 Min-No Report  Result Date: 09/23/2023 Fluoroscopy was utilized by the requesting physician.  No radiographic interpretation.   DG Knee Complete 4 Views Right  Result Date: 09/23/2023 CLINICAL DATA:  Fall EXAM: RIGHT KNEE - COMPLETE 4+ VIEW COMPARISON:  None Available. FINDINGS: Acute displaced proximal fibular shaft fracture. No dislocation at the knee. Moderate tricompartment arthritis. No significant effusion IMPRESSION: Acute displaced proximal fibular shaft fracture. Electronically Signed   By: Jasmine Pang M.D.   On: 09/23/2023 03:53   DG Tibia/Fibula Right  Result Date:  09/23/2023 CLINICAL DATA:  Fall with pain EXAM: RIGHT TIBIA AND FIBULA - 2 VIEW COMPARISON:  None Available. FINDINGS: Acute mildly comminuted fracture proximal shaft of fibula with apex lateral angulation and slightly greater than 1/2 shaft diameter lateral and anterior displacement of distal fracture fragment. Additional displaced spiral fracture distal shaft of tibia IMPRESSION: 1. Acute displaced and slightly angulated proximal fibular shaft fracture 2. Acute displaced distal tibial fracture Electronically Signed   By: Jasmine Pang M.D.   On: 09/23/2023 03:52   DG Ankle 2 Views Right  Result Date: 09/23/2023 CLINICAL DATA:  Fall with ankle pain EXAM: RIGHT ANKLE - 2 VIEW COMPARISON:  None Available. FINDINGS: Acute spiral fracture distal shaft of the tibia with about 1/3 shaft diameter lateral and  posterior displacement of distal fracture fragment. IMPRESSION: Acute displaced spiral fracture of distal tibial shaft. Electronically Signed   By: Jasmine Pang M.D.   On: 09/23/2023 03:51   DG Hip Unilat W or Wo Pelvis 2-3 Views Right  Result Date: 09/23/2023 CLINICAL DATA:  Pain post fall EXAM: DG HIP (WITH OR WITHOUT PELVIS) 2-3V RIGHT COMPARISON:  None Available. FINDINGS: SI joints are non widened. Pubic symphysis and rami appear intact. No fracture or dislocation. Phleboliths in the pelvis IMPRESSION: No acute osseous abnormality. Electronically Signed   By: Jasmine Pang M.D.   On: 09/23/2023 03:50   DG Chest Portable 1 View  Result Date: 09/23/2023 CLINICAL DATA:  Fall EXAM: PORTABLE CHEST 1 VIEW COMPARISON:  08/23/2023, 12/12/2021, CT 06/24/2022 FINDINGS: Vascular occlusion device at the right cardiac silhouette. Stable mild pleural thickening versus small effusions. Enlarged cardiomediastinal silhouette. Patchy atelectasis and or scarring at the bases. Upper normal mediastinal silhouette but stable. No pneumothorax. IMPRESSION: Stable mild pleural thickening versus small effusions. Patchy  atelectasis and or scarring at the bases. Enlarged cardiomediastinal silhouette. Electronically Signed   By: Jasmine Pang M.D.   On: 09/23/2023 03:49   CT Head Wo Contrast  Result Date: 09/23/2023 CLINICAL DATA:  Fall EXAM: CT HEAD WITHOUT CONTRAST CT CERVICAL SPINE WITHOUT CONTRAST TECHNIQUE: Multidetector CT imaging of the head and cervical spine was performed following the standard protocol without intravenous contrast. Multiplanar CT image reconstructions of the cervical spine were also generated. RADIATION DOSE REDUCTION: This exam was performed according to the departmental dose-optimization program which includes automated exposure control, adjustment of the mA and/or kV according to patient size and/or use of iterative reconstruction technique. COMPARISON:  08/23/2023 FINDINGS: CT HEAD FINDINGS Brain: There is no mass, hemorrhage or extra-axial collection. The size and configuration of the ventricles and extra-axial CSF spaces are normal. The brain parenchyma is normal, without evidence of acute or chronic infarction. Vascular: No abnormal hyperdensity of the major intracranial arteries or dural venous sinuses. No intracranial atherosclerosis. Skull: The visualized skull base, calvarium and extracranial soft tissues are normal. Sinuses/Orbits: No fluid levels or advanced mucosal thickening of the visualized paranasal sinuses. No mastoid or middle ear effusion. The orbits are normal. CT CERVICAL SPINE FINDINGS Alignment: No static subluxation. Facets are aligned. Occipital condyles are normally positioned. Skull base and vertebrae: No acute fracture. Soft tissues and spinal canal: No prevertebral fluid or swelling. No visible canal hematoma. Disc levels: No advanced spinal canal or neural foraminal stenosis. Upper chest: No pneumothorax, pulmonary nodule or pleural effusion. Other: Normal visualized paraspinal cervical soft tissues. IMPRESSION: 1. No acute intracranial abnormality. 2. No acute fracture  or static subluxation of the cervical spine. Electronically Signed   By: Deatra Robinson M.D.   On: 09/23/2023 03:29   CT Cervical Spine Wo Contrast  Result Date: 09/23/2023 CLINICAL DATA:  Fall EXAM: CT HEAD WITHOUT CONTRAST CT CERVICAL SPINE WITHOUT CONTRAST TECHNIQUE: Multidetector CT imaging of the head and cervical spine was performed following the standard protocol without intravenous contrast. Multiplanar CT image reconstructions of the cervical spine were also generated. RADIATION DOSE REDUCTION: This exam was performed according to the departmental dose-optimization program which includes automated exposure control, adjustment of the mA and/or kV according to patient size and/or use of iterative reconstruction technique. COMPARISON:  08/23/2023 FINDINGS: CT HEAD FINDINGS Brain: There is no mass, hemorrhage or extra-axial collection. The size and configuration of the ventricles and extra-axial CSF spaces are normal. The brain parenchyma is normal, without evidence of acute  or chronic infarction. Vascular: No abnormal hyperdensity of the major intracranial arteries or dural venous sinuses. No intracranial atherosclerosis. Skull: The visualized skull base, calvarium and extracranial soft tissues are normal. Sinuses/Orbits: No fluid levels or advanced mucosal thickening of the visualized paranasal sinuses. No mastoid or middle ear effusion. The orbits are normal. CT CERVICAL SPINE FINDINGS Alignment: No static subluxation. Facets are aligned. Occipital condyles are normally positioned. Skull base and vertebrae: No acute fracture. Soft tissues and spinal canal: No prevertebral fluid or swelling. No visible canal hematoma. Disc levels: No advanced spinal canal or neural foraminal stenosis. Upper chest: No pneumothorax, pulmonary nodule or pleural effusion. Other: Normal visualized paraspinal cervical soft tissues. IMPRESSION: 1. No acute intracranial abnormality. 2. No acute fracture or static subluxation of  the cervical spine. Electronically Signed   By: Deatra Robinson M.D.   On: 09/23/2023 03:29        Scheduled Meds:  acetaminophen  1,000 mg Oral Q8H   ALPRAZolam  1 mg Oral TID   apixaban  2.5 mg Oral BID   carvedilol  6.25 mg Oral BID   Chlorhexidine Gluconate Cloth  6 each Topical Daily   docusate sodium  100 mg Oral BID   pantoprazole  40 mg Oral BID   sildenafil  20 mg Oral TID   sodium chloride flush  3 mL Intravenous Q12H   Continuous Infusions:   LOS: 1 day    Time spent: over 30 min    Lacretia Nicks, MD [facility]   To contact the attending provider between 7A-7P or the covering provider during after hours 7P-7A, please log into the web site [email or web address] and access using universal [place] password for that web site. If you do not have the password, please call the hospital operator.  09/24/2023, 5:47 PM

## 2023-09-24 NOTE — Progress Notes (Signed)
[facility] Progress Note  Patient ID: Teresa Wiley MRN: [id number] DOB/AGE: [id number] 66 y.o.  Subjective:  Patient reports pain as {DEGREE - MILD, MOD, SEV:20224}.   Patient reports pain as {pain:3041404}.  ***  ***(cosigner if appropriate)   ROS  Objective:   VITALS:   Vitals:   09/23/23 2017 09/23/23 2321 09/24/23 0311 09/24/23 0720  BP: 113/74 121/80  123/85  Pulse: 95 90    Resp: (!) 26 15  18   Temp: 98.4 F (36.9 C) 98 F (36.7 C) 98.5 F (36.9 C) 98.7 F (37.1 C)  TempSrc: Oral Oral Oral Oral  SpO2:  90%  99%  Weight:   108.7 kg   Height:        Estimated body mass index is 34.38 kg/m as calculated from the following:   Height as of this encounter: 5\' 10"  (1.778 m).   Weight as of this encounter: 108.7 kg.   Intake/Output      10/29 0701 10/30 0700 10/30 0701 10/31 0700   P.O. 340    I.V. (mL/kg) 1520 (14)    IV Piggyback 1050    Total Intake(mL/kg) 2910 (26.8)    Urine (mL/kg/hr) 450 (0.2)    Blood 50    Total Output 500    Net +2410           LABS  Results for orders placed or performed during the hospital encounter of 09/23/23 (from the past 24 hour(s))  Hemoglobin and hematocrit, blood     Status: Abnormal   Collection Time: 09/23/23 12:40 PM  Result Value Ref Range   Hemoglobin 10.9 (L) 12.0 - 15.0 g/dL   HCT 78.2 95.6 - 21.3 %  Lactic acid, plasma     Status: None   Collection Time: 09/23/23 12:40 PM  Result Value Ref Range   Lactic Acid, Venous 0.9 0.5 - 1.9 mmol/L  Surgical pcr screen     Status: None   Collection Time: 09/23/23  2:41 PM   Specimen: Nasal Mucosa; Nasal Swab  Result Value Ref Range   MRSA, PCR NEGATIVE NEGATIVE   Staphylococcus aureus NEGATIVE NEGATIVE  Glucose, capillary     Status: None   Collection Time: 09/23/23  4:34 PM  Result Value Ref Range   Glucose-Capillary 71 70 - 99 mg/dL  Glucose, capillary      Status: Abnormal   Collection Time: 09/23/23  7:25 PM  Result Value Ref Range   Glucose-Capillary 118 (H) 70 - 99 mg/dL  Glucose, capillary     Status: Abnormal   Collection Time: 09/23/23 11:19 PM  Result Value Ref Range   Glucose-Capillary 126 (H) 70 - 99 mg/dL  CBC     Status: Abnormal   Collection Time: 09/24/23  2:46 AM  Result Value Ref Range   WBC 7.3 4.0 - 10.5 K/uL   RBC 4.13 3.87 - 5.11 MIL/uL   Hemoglobin 10.5 (L) 12.0 - 15.0 g/dL   HCT 08.6 (L) 57.8 - 46.9 %   MCV 86.9 80.0 - 100.0 fL   MCH 25.4 (L) 26.0 - 34.0 pg   MCHC 29.2 (L) 30.0 - 36.0 g/dL   RDW 62.9 52.8 - 41.3 %   Platelets 285 150 - 400 K/uL   nRBC 0.0 0.0 - 0.2 %  Comprehensive metabolic panel  VTE prophylaxis: {Type:21257} {Duration:21258}  Dispo: *** D/C recommendations:  Follow - up plan: {Follow-up plan:21259}  Contact information:  *** MD, *** PA   Mearl Latin, PA-C [phone]  (C) 09/24/2023, 9:07 AM  [facility] [address] [place] [address] [phone] [phone] (F)    After 5pm and on the weekends please log on to Amion, go to orthopaedics and the look under the [facility] Call for the provider(s) on call. You can also call our office at [phone] and then follow the prompts to be connected to the call team.  Patient ID: Teresa Wiley, female   DOB: [birth year], 68 y.o.   MRN: [id number]  VTE prophylaxis: {Type:21257} {Duration:21258}  Dispo: *** D/C recommendations:  Follow - up plan: {Follow-up plan:21259}  Contact information:  *** MD, *** PA   Mearl Latin, PA-C [phone]  (C) 09/24/2023, 9:07 AM  [facility] [address] [place] [address] [phone] [phone] (F)    After 5pm and on the weekends please log on to Amion, go to orthopaedics and the look under the [facility] Call for the provider(s) on call. You can also call our office at [phone] and then follow the prompts to be connected to the call team.  Patient ID: Teresa Wiley, female   DOB: [birth year], 68 y.o.   MRN: [id number]

## 2023-09-25 ENCOUNTER — Inpatient Hospital Stay (HOSPITAL_COMMUNITY): Payer: Medicare Other

## 2023-09-25 DIAGNOSIS — S82831A Other fracture of upper and lower end of right fibula, initial encounter for closed fracture: Secondary | ICD-10-CM | POA: Diagnosis not present

## 2023-09-25 DIAGNOSIS — S82241A Displaced spiral fracture of shaft of right tibia, initial encounter for closed fracture: Secondary | ICD-10-CM | POA: Diagnosis not present

## 2023-09-25 DIAGNOSIS — M25561 Pain in right knee: Secondary | ICD-10-CM | POA: Diagnosis not present

## 2023-09-25 DIAGNOSIS — W19XXXA Unspecified fall, initial encounter: Secondary | ICD-10-CM | POA: Diagnosis not present

## 2023-09-25 LAB — COMPREHENSIVE METABOLIC PANEL
ALT: 8 U/L (ref 0–44)
AST: 13 U/L — ABNORMAL LOW (ref 15–41)
Albumin: 2.7 g/dL — ABNORMAL LOW (ref 3.5–5.0)
Alkaline Phosphatase: 72 U/L (ref 38–126)
Anion gap: 6 (ref 5–15)
BUN: 6 mg/dL — ABNORMAL LOW (ref 8–23)
CO2: 30 mmol/L (ref 22–32)
Calcium: 8.7 mg/dL — ABNORMAL LOW (ref 8.9–10.3)
Chloride: 102 mmol/L (ref 98–111)
Creatinine, Ser: 0.83 mg/dL (ref 0.44–1.00)
GFR, Estimated: >60 mL/min (ref >60)
Glucose, Bld: 94 mg/dL (ref 70–99)
Potassium: 3.5 mmol/L (ref 3.5–5.1)
Sodium: 138 mmol/L (ref 135–145)
Total Bilirubin: 0.7 mg/dL (ref 0.3–1.2)
Total Protein: 5.8 g/dL — ABNORMAL LOW (ref 6.5–8.1)

## 2023-09-25 LAB — CBC WITH DIFFERENTIAL/PLATELET
Abs Immature Granulocytes: 0.02 10*3/uL (ref 0.00–0.07)
Basophils Absolute: 0 10*3/uL (ref 0.0–0.1)
Basophils Relative: 0 %
Eosinophils Absolute: 0.1 10*3/uL (ref 0.0–0.5)
Eosinophils Relative: 1 %
HCT: 30 % — ABNORMAL LOW (ref 36.0–46.0)
Hemoglobin: 9 g/dL — ABNORMAL LOW (ref 12.0–15.0)
Immature Granulocytes: 0 %
Lymphocytes Relative: 30 %
Lymphs Abs: 1.7 10*3/uL (ref 0.7–4.0)
MCH: 25.4 pg — ABNORMAL LOW (ref 26.0–34.0)
MCHC: 30 g/dL (ref 30.0–36.0)
MCV: 84.7 fL (ref 80.0–100.0)
Monocytes Absolute: 0.8 10*3/uL (ref 0.1–1.0)
Monocytes Relative: 14 %
Neutro Abs: 3.1 10*3/uL (ref 1.7–7.7)
Neutrophils Relative %: 55 %
Platelets: 253 10*3/uL (ref 150–400)
RBC: 3.54 MIL/uL — ABNORMAL LOW (ref 3.87–5.11)
RDW: 14.8 % (ref 11.5–15.5)
WBC: 5.6 10*3/uL (ref 4.0–10.5)
nRBC: 0 % (ref 0.0–0.2)

## 2023-09-25 LAB — PHOSPHORUS: Phosphorus: 3.6 mg/dL (ref 2.5–4.6)

## 2023-09-25 LAB — MAGNESIUM: Magnesium: 1.9 mg/dL (ref 1.7–2.4)

## 2023-09-25 MED ORDER — OXYCODONE HCL 5 MG PO TABS
15.0000 mg | ORAL_TABLET | ORAL | Status: DC | PRN
  Administered 2023-09-25 – 2023-09-26 (×2): 15 mg via ORAL
  Filled 2023-09-25 (×2): qty 3

## 2023-09-25 MED ORDER — METHOCARBAMOL 1000 MG/10ML IJ SOLN
500.0000 mg | Freq: Three times a day (TID) | INTRAMUSCULAR | Status: DC | PRN
  Administered 2023-09-25 – 2023-09-29 (×6): 500 mg via INTRAVENOUS
  Filled 2023-09-25 (×6): qty 10

## 2023-09-25 MED ORDER — POTASSIUM CHLORIDE CRYS ER 20 MEQ PO TBCR
40.0000 meq | EXTENDED_RELEASE_TABLET | Freq: Once | ORAL | Status: AC
  Administered 2023-09-25: 40 meq via ORAL
  Filled 2023-09-25: qty 2

## 2023-09-25 MED ORDER — OXYCODONE HCL 5 MG PO TABS
10.0000 mg | ORAL_TABLET | ORAL | Status: DC | PRN
  Administered 2023-09-25 – 2023-09-26 (×2): 10 mg via ORAL
  Filled 2023-09-25 (×2): qty 2

## 2023-09-25 MED ORDER — FUROSEMIDE 40 MG PO TABS
40.0000 mg | ORAL_TABLET | Freq: Every morning | ORAL | Status: DC
  Administered 2023-09-25 – 2023-10-02 (×8): 40 mg via ORAL
  Filled 2023-09-25 (×8): qty 1

## 2023-09-25 MED ORDER — SODIUM CHLORIDE 0.9 % IV SOLN
100.0000 mg | Freq: Once | INTRAVENOUS | Status: AC
  Administered 2023-09-25: 100 mg via INTRAVENOUS
  Filled 2023-09-25 (×2): qty 5

## 2023-09-25 NOTE — Progress Notes (Signed)
Physical Therapy Treatment Patient Details Name: Teresa Wiley MRN: [id number] DOB: 1956-06-12 Today's Date: 09/25/2023   History of Present Illness Patient is a 67 y/o female admitted 09/23/23 due to fall when she reached down and tripped on oxygen tubing.  Found to have R tibia fx and proximal fibular maisonneuve fx.  She underwent IM nail of tibia fx on same day.  PMH includes chronic hypercapnic respiratory failure due to pulmonary hypertension on chronic home O2, bronchomalacia, ASD s/p percutaneous closure 10/23, PE 2017, HTN, DM II and chronic pain syndrome.    PT Comments  Upon entering, this pt was both anxious and visibly in pain.  Pain limited R knee AA/PROM to <10 degrees and would not allow pt to sit up EOB to work on passive knee flexion or tolerance to sitting.  Pt bridged to EOB and aborted further mobility before bridging back to middle of the bed.  Pain was extreme and Dilaudid did not appear to be making a positive difference.  Nursing notified.     If plan is discharge home, recommend the following: A little help with walking and/or transfers;Assistance with cooking/housework;Assist for transportation;Help with stairs or ramp for entrance;A little help with bathing/dressing/bathroom   Can travel by private vehicle     No  Equipment Recommendations  Rolling walker (2 wheels);BSC/3in1    Recommendations for Other Services       Precautions / Restrictions Precautions Precautions: Fall Required Braces or Orthoses: Splint/Cast Splint/Cast: R lower leg Restrictions RLE Weight Bearing: Non weight bearing     Mobility  Bed Mobility Overal bed mobility: Needs Assistance             General bed mobility comments: pt allow bridging to EOB, but was so painful, she was not able to pivot and sit up EOB.  So, she was assisted to scoot back into middle of the bed, pt continuing to Sob    Transfers Overall transfer level: Needs assistance                  General transfer comment: unable today    Ambulation/Gait                   Stairs             Wheelchair Mobility     Tilt Bed    Modified Rankin (Stroke Patients Only)       Balance                                            Cognition Arousal: Alert Behavior During Therapy: Anxious, Flat affect Overall Cognitive Status: Within Functional Limits for tasks assessed                                          Exercises Other Exercises Other Exercises: L LE hip/knee flex/ext with graded resistance x10, very limited R knee flex/ext P/AAROM no more than 10/15 degs Other Exercises: Bridging/scooting to/from EOB    General Comments General comments (skin integrity, edema, etc.): VSS,   Sats were mid 90's on 2L      Pertinent Vitals/Pain Pain Assessment Pain Assessment: 0-10 Pain Score: 8  Pain Location: R knee down to the ankle Pain Descriptors / Indicators: Crying, Guarding, Jabbing, Sharp Pain  Intervention(s): Limited activity within patient's tolerance, Monitored during session, Patient requesting pain meds-RN notified, Repositioned, Premedicated before session    Home Living                          Prior Function            PT Goals (current goals can now be found in the care plan section) Acute Rehab PT Goals PT Goal Formulation: With patient Time For Goal Achievement: 10/08/23 Potential to Achieve Goals: Good Progress towards PT goals: Not progressing toward goals - comment (Pain meds did not moderate the pain enough to mobilize)    Frequency    Min 1X/week      PT Plan      Co-evaluation              AM-PAC PT "6 Clicks" Mobility   Outcome Measure  Help needed turning from your back to your side while in a flat bed without using bedrails?: A Lot Help needed moving from lying on your back to sitting on the side of a flat bed without using bedrails?: A Lot Help needed moving to  and from a bed to a chair (including a wheelchair)?: Total Help needed standing up from a chair using your arms (e.g., wheelchair or bedside chair)?: Total Help needed to walk in hospital room?: Total Help needed climbing 3-5 steps with a railing? : Total 6 Click Score: 8    End of Session Equipment Utilized During Treatment: Oxygen Activity Tolerance: Patient limited by pain Patient left: in bed;with call bell/phone within reach Nurse Communication: Mobility status PT Visit Diagnosis: Other abnormalities of gait and mobility (R26.89);Muscle weakness (generalized) (M62.81);Pain Pain - Right/Left: Right Pain - part of body: Knee;Leg     Time: 4098-1191 PT Time Calculation (min) (ACUTE ONLY): 20 min  Charges:    $Therapeutic Activity: 8-22 mins PT General Charges $$ ACUTE PT VISIT: 1 Visit                     09/25/2023  Jacinto Halim., PT Acute Rehabilitation Services [phone]  (office)   Eliseo Gum Iraida Cragin 09/25/2023, 2:29 PM

## 2023-09-25 NOTE — Plan of Care (Signed)
  Problem: Education: Goal: Knowledge of General Education information will improve Description: Including pain rating scale, medication(s)/side effects and non-pharmacologic comfort measures Outcome: Progressing   Problem: Clinical Measurements: Goal: Will remain free from infection Outcome: Progressing   Problem: Nutrition: Goal: Adequate nutrition will be maintained Outcome: Progressing   Problem: Safety: Goal: Ability to remain free from injury will improve Outcome: Progressing

## 2023-09-25 NOTE — Progress Notes (Signed)
PROGRESS NOTE    Teresa Wiley  MWN:[id number] DOB: 06/02/1956 DOA: 09/23/2023 PCP: Maye Hides, PA  Chief Complaint  Patient presents with   Fall    Brief Narrative:   Teresa Wiley is Teresa Wiley 67 y.o. female with medical history significant of chronic hypoxic and hypercapnic respiratory failure secondary to pulmonary hypertension, bronchomalacia, ASD status post percutaneous closure October 2023, PE in 2017, essential hypertension, DM type II and chronic pain syndrome presented to emergency department after [name] fall and complaining of right-sided leg pain. States she was trying to walk back from the bathroom when she noticed her oxygen had become unplugged. She attempted to bend over to fix it and she ended up falling hurting her right lower leg. States she felt dizzy when bending over and somehow ended up on the ground. Does not think she lost consciousness but is not sure. Complains of pain to her right lower leg and ankle. Did hit her head but denies any head pain.  At this moment patient denies any chest pain, chronic changes of her shortness of breath, abdominal pain, nausea, vomiting, constipation, diarrhea, generalized weakness, focal weakness, numbness and tingling.    Assessment & Plan:   Principal Problem:   Fall Active Problems:   Closed right tibial fracture   Closed right fibular fracture   Essential hypertension   Chronic pain syndrome   Pulmonary hypertension (HCC)   Non-insulin dependent type 2 diabetes mellitus (HCC)   Pulmonary embolism (HCC)in 2017   Chronic hypoxic respiratory failure (HCC)   ASD (atrial septal defect) status post closure October 2023   Bronchomalacia  Acute right tibular/fibular fractures Mechnical Fall  Secondary to fall S/p R tibial shaft fracture IM nailing with synthes 9x375 mm statically locked, manual application of stress under fluoroscopy R ankle Pain control, bowel regimen Appreciate ortho recs, note pending 10/30  NWB to RLE,  CAM boot for support prn  Eliquis ordered per ortho for dvt ppx Foley out today   Chronic anemia Folate Deficiency Iron Deficiency Unclear etiology per chart review but there is concern for chronic blood loss anemia. Baseline hemoglobin of about 10 g/dL.  Low iron, folate - normal B12 Start folate and iron supplementation - will order IV iron  Needs additional workup of iron def anemia, concern for chronic blood loss anemia outpatient   Atrial septal defect s/p closure (08/2022) Coreg 6.125 mg BID, currently on aspirin monotherapy (completed 6 months of DAPT with aspirin/plavix)  History of PE Not on anticoagulation   Chronic respiratory failure with hypoxia and hypercapnia Pulmonary hypertension Currently on 4 L [place] (she's on -23 L at baseline at home) Sildenafil 20 mg TID She noted some subjective SOB today -> follow CXR (Mildly elevated BNP yesterday, lasix resuming today) Goal O2 sat over 94%  Primary hypertension Coreg Resume lasix Had hypotension 10/29, resolved    Chronic pain syndrome Adjust meds as needed post op  Diabetes mellitus type 2 A1c 5.7  Obesity Body mass index is 34.01 kg/m.    DVT prophylaxis: eliquis Code Status: full Family Communication: none Disposition:   Status is: Inpatient Remains inpatient appropriate because: need for continued inpatient care/pain control   Consultants:  orthopedics  Procedures:  PROCEDURE:  Procedure(s): 1. RIGHT TIBIAL SHAFT FRACTURE INTRAMEDULLARY NAILING  with Synthes 9 x 375 mm, statically locked 2. MANUAL APPLICATION OF STRESS UNDER FLUOROSCOPY RIGHT ANKLE  Antimicrobials:  Anti-infectives (From admission, onward)    Start     Dose/Rate Route Frequency Ordered Stop  09/24/23 0600  ceFAZolin (ANCEF) IVPB 2g/100 mL premix        2 g 200 mL/hr over 30 Minutes Intravenous On call to O.R. 09/23/23 1439 09/23/23 1727   09/24/23 0000  ceFAZolin (ANCEF) IVPB 1 g/50 mL premix        1 g 100 mL/hr over 30  Minutes Intravenous Every 6 hours 09/23/23 2000 09/24/23 1930   09/23/23 1440  ceFAZolin (ANCEF) 2-4 GM/100ML-% IVPB       Note to Pharmacy: Lacie Draft Teresina Bugaj: cabinet override      09/23/23 1440 09/23/23 1730       Subjective: Continued leg pain  C/o SOB since surgery  Objective: Vitals:   09/24/23 2322 09/25/23 0330 09/25/23 0336 09/25/23 0725  BP: 112/76 111/62  116/74  Pulse: 80 80  73  Resp: 14 17  11   Temp: 98.3 F (36.8 C) 98.5 F (36.9 C)  97.6 F (36.4 C)  TempSrc: Oral Oral  Oral  SpO2: 94% 96%  98%  Weight:   107.5 kg   Height:        Intake/Output Summary (Last 24 hours) at 09/25/2023 0853 Last data filed at 09/25/2023 0330 Gross per 24 hour  Intake 150 ml  Output 750 ml  Net -600 ml   Filed Weights   09/23/23 1557 09/24/23 0311 09/25/23 0336  Weight: 108.3 kg 108.7 kg 107.5 kg    Examination:  General: No acute distress. Cardiovascular: RRR Lungs: Clear to auscultation bilaterally - on 2 L  Abdomen: Soft, nontender, nondistended  Neurological: Alert and oriented 3. Moves all extremities 4 with equal strength. Cranial nerves II through XII grossly intact. Extremities: RLE splint  Data Reviewed: I have personally reviewed following labs and imaging studies  CBC: Recent Labs  Lab 09/23/23 0313 09/23/23 0542 09/23/23 1240 09/24/23 0246 09/25/23 0251  WBC 5.6 8.2  --  7.3 5.6  NEUTROABS 3.4  --   --   --  3.1  HGB 11.3* 11.9* 10.9* 10.5* 9.0*  HCT 39.4 41.4 38.0 35.9* 30.0*  MCV 87.2 89.8  --  86.9 84.7  PLT 294 307  --  285 253    Basic Metabolic Panel: Recent Labs  Lab 09/23/23 0313 09/23/23 0542 09/24/23 0246 09/25/23 0251  NA 139 140 140 138  K 3.9 4.6 4.2 3.5  CL 101 100 101 102  CO2 31 29 30 30   GLUCOSE 103* 84 131* 94  BUN 7* 8 7* 6*  CREATININE 0.83 0.88 0.85 0.83  CALCIUM 8.9 9.1 8.9 8.7*  MG  --   --   --  1.9  PHOS  --   --   --  3.6    GFR: Estimated Creatinine Clearance: 88.5 mL/min (by C-G formula based  on SCr of 0.83 mg/dL).  Liver Function Tests: Recent Labs  Lab 09/23/23 0313 09/23/23 0542 09/24/23 0246 09/25/23 0251  AST 14* 21 14* 13*  ALT 9 9 10 8   ALKPHOS 88 87 86 72  BILITOT 0.4 0.6 0.7 0.7  PROT 7.1 7.3 6.6 5.8*  ALBUMIN 3.2* 3.3* 3.0* 2.7*    CBG: Recent Labs  Lab 09/23/23 1634 09/23/23 1925 09/23/23 2319 09/24/23 0638 09/24/23 1122  GLUCAP 71 118* 126* 115* 133*     Recent Results (from the past 240 hour(s))  Surgical pcr screen     Status: None   Collection Time: 09/23/23  2:41 PM   Specimen: Nasal Mucosa; Nasal Swab  Result Value Ref Range Status   MRSA,  PCR NEGATIVE NEGATIVE Final   Staphylococcus aureus NEGATIVE NEGATIVE Final    Comment: (NOTE) The Xpert SA Assay (FDA approved for NASAL specimens in patients 52 years of age and older), is one component of Idell Hissong comprehensive surveillance program. It is not intended to diagnose infection nor to guide or monitor treatment. Performed at [facility], [address], [place], [place] [address]          Radiology Studies: DG Tibia/Fibula Right Port  Result Date: 09/23/2023 CLINICAL DATA:  Postop. EXAM: PORTABLE RIGHT TIBIA AND FIBULA - 2 VIEW; RIGHT ANKLE - COMPLETE 3+ VIEW COMPARISON:  Preoperative imaging. FINDINGS: Tibia/fibula: Tibial intramedullary nail proximal and distal locking screw fixation traverse distal tibial fracture. Improved fracture alignment from preoperative imaging. Proximal fibular shaft fracture is also in unchanged alignment. Overlying splint material distally. Ankle: The ankle mortise is preserved. There may be Pricella Gaugh tiny chip fracture from the distal medial malleolus. Detailed assessment is limited by overlying soft tissue edema. IMPRESSION: 1. ORIF of distal tibial fracture with improved alignment from preoperative imaging. 2. Proximal fibular shaft fracture in unchanged alignment. 3. Possible tiny chip fracture from the distal medial malleolus. Electronically Signed   By:  Narda Rutherford M.D.   On: 09/23/2023 21:46   DG Ankle Complete Right  Result Date: 09/23/2023 CLINICAL DATA:  Postop. EXAM: PORTABLE RIGHT TIBIA AND FIBULA - 2 VIEW; RIGHT ANKLE - COMPLETE 3+ VIEW COMPARISON:  Preoperative imaging. FINDINGS: Tibia/fibula: Tibial intramedullary nail proximal and distal locking screw fixation traverse distal tibial fracture. Improved fracture alignment from preoperative imaging. Proximal fibular shaft fracture is also in unchanged alignment. Overlying splint material distally. Ankle: The ankle mortise is preserved. There may be Quatisha Zylka tiny chip fracture from the distal medial malleolus. Detailed assessment is limited by overlying soft tissue edema. IMPRESSION: 1. ORIF of distal tibial fracture with improved alignment from preoperative imaging. 2. Proximal fibular shaft fracture in unchanged alignment. 3. Possible tiny chip fracture from the distal medial malleolus. Electronically Signed   By: Narda Rutherford M.D.   On: 09/23/2023 21:46   DG Tibia/Fibula Right  Result Date: 09/23/2023 CLINICAL DATA:  Fracture. EXAM: RIGHT TIBIA AND FIBULA - 2 VIEW COMPARISON:  Preoperative imaging. FINDINGS: Fourteen fluoroscopic spot views of the right tibia and fibula obtained in the operating room. Imaging obtained during tibial intramedullary nail proximal and distal locking screw fixation traversing distal tibial fracture. Fluoroscopy time 1 minutes 7 seconds. Dose 3.02 mGy. IMPRESSION: Intraoperative fluoroscopy during tibial fracture fixation. Electronically Signed   By: Narda Rutherford M.D.   On: 09/23/2023 21:45   DG C-Arm 1-60 Min-No Report  Result Date: 09/23/2023 Fluoroscopy was utilized by the requesting physician.  No radiographic interpretation.   DG C-Arm 1-60 Min-No Report  Result Date: 09/23/2023 Fluoroscopy was utilized by the requesting physician.  No radiographic interpretation.        Scheduled Meds:  acetaminophen  1,000 mg Oral Q8H   ALPRAZolam  1 mg  Oral TID   apixaban  2.5 mg Oral BID   carvedilol  6.25 mg Oral BID   Chlorhexidine Gluconate Cloth  6 each Topical Daily   docusate sodium  100 mg Oral BID   ferrous sulfate  325 mg Oral QODAY   folic acid  1 mg Oral Daily   pantoprazole  40 mg Oral BID   polyethylene glycol  17 g Oral BID   sildenafil  20 mg Oral TID   sodium chloride flush  3 mL Intravenous Q12H  Continuous Infusions:   LOS: 2 days    Time spent: over 30 min    Lacretia Nicks, MD [facility]   To contact the attending provider between 7A-7P or the covering provider during after hours 7P-7A, please log into the web site [email or web address] and access using universal [place] password for that web site. If you do not have the password, please call the hospital operator.  09/25/2023, 8:53 AM

## 2023-09-25 NOTE — Plan of Care (Signed)

## 2023-09-25 NOTE — NC FL2 (Signed)
[place] MEDICAID FL2 LEVEL OF CARE FORM     IDENTIFICATION  Patient Name: Teresa Wiley Birthdate: 15-Sep-1956 Sex: female Admission Date (Current Location): 09/23/2023  [facility] and [place] Number:  Producer, television/film/video and Address:  [facility], 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) 09/13/2022   ASD (atrial septal defect) status post closure October 2023    Syncope and collapse 06/24/2022   Vomiting and diarrhea    Allergy to contrast media (used for diagnostic x-rays)    OSA (obstructive sleep apnea) 08/24/2021   PND (post-nasal drip) 06/07/2021   Chest pain 07/19/2020   Severe sepsis with acute organ dysfunction (HCC) 07/01/2020   Acute on chronic diastolic CHF (congestive heart failure) (HCC) 02/09/2019   AKI (acute kidney injury) (HCC) 02/09/2019   Pulmonary embolism (HCC)in 2017 02/09/2019   GERD (gastroesophageal reflux disease) 02/09/2019   Chronic hypoxic respiratory failure (HCC) 02/09/2019   Encephalopathy, toxic 12/07/2018   Encephalopathy 12/06/2018   Recurrent syncope 11/17/2018   Polypharmacy 11/17/2018   Non-insulin dependent type 2 diabetes mellitus (HCC) 01/10/2016   ARF (acute renal failure) (HCC) 01/10/2016   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   Cerebral infarction (HCC)    Narcotic dependence  (HCC) 10/18/2014   Chronic pain syndrome 10/18/2014   Diabetes type 2, controlled (HCC)    Pulmonary hypertension (HCC)    Syncope, vasovagal 07/19/2014   S/P cholecystectomy 07/19/2014   Abnormal EKG 07/19/2014   Sludge in gallbladder 03/09/2014   Steatohepatitis, 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/2014   Tobacco abuse 03/09/2014   Right-sided chest wall pain 03/09/2014   Right flank pain 03/09/2014   Right lateral abdominal pain (RUQ & RLQ) 03/09/2014   Urinary incontinence in female 03/09/2014   Coccydynia 03/09/2014   Nocturnal hypoxemia due to obesity 03/09/2014   DM (diabetes mellitus) (HCC) 03/09/2014   Essential hypertension 03/09/2014   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   Chronic back pain    Class 2 obesity due to excess calories with body mass index (BMI) of 35.0 to 35.9 in adult    Anxiety 03/08/2014   Disequilibrium 11/15/2012   Weakness 11/15/2012   DDD (degenerative disc disease)     Orientation RESPIRATION BLADDER Height & Weight     Self, Time, Situation, Place  O2 ([place] 2L) Indwelling catheter Weight: 236 lb 15.9 oz (107.5 kg) Height:  5\' 10"  (177.8 cm)  BEHAVIORAL SYMPTOMS/MOOD NEUROLOGICAL BOWEL NUTRITION STATUS      Continent Diet (See DC summary)  AMBULATORY STATUS COMMUNICATION OF NEEDS Skin   Extensive Assist Verbally Surgical wounds (R leg incision)                       Personal Care Assistance Level of Assistance  Bathing, Feeding, Dressing Bathing Assistance: Maximum assistance Feeding assistance: Independent Dressing Assistance: Maximum assistance  Functional Limitations Info  Sight, Hearing, Speech Sight Info: Adequate Hearing Info: Adequate Speech Info: Adequate    SPECIAL CARE FACTORS FREQUENCY  PT (By licensed PT), OT (By licensed OT)     PT Frequency: 5x week OT Frequency: 5x week            Contractures Contractures Info: Not present    Additional Factors Info  Code Status, Allergies, Psychotropic Code Status Info:  Full Allergies Info: Belbuca (Buprenorphine Hcl)  E.e.s. (Erythromycin)  Iodine  Morphine And Codeine  Shellfish Allergy  Shellfish-derived Products  Pork-derived Products  Other Psychotropic Info: Alprazolam         Current Medications (09/25/2023):  This is the current hospital active medication list Current Facility-Administered Medications  Medication Dose Route Frequency Provider Last Rate Last Admin   acetaminophen (TYLENOL) tablet 1,000 mg  1,000 mg Oral Q8H Zigmund Daniel., MD   1,000 mg at 09/25/23 1343   Followed by   Melene Muller ON 09/29/2023] acetaminophen (TYLENOL) tablet 650 mg  650 mg Oral Q6H PRN Zigmund Daniel., MD       ALPRAZolam Prudy Feeler) tablet 1 mg  1 mg Oral TID Montez Morita, PA-C   1 mg at 09/25/23 1546   apixaban (ELIQUIS) tablet 2.5 mg  2.5 mg Oral BID Montez Morita, PA-C   2.5 mg at 09/25/23 0847   carvedilol (COREG) tablet 6.25 mg  6.25 mg Oral BID Montez Morita, PA-C   6.25 mg at 09/25/23 1610   Chlorhexidine Gluconate Cloth 2 % PADS 6 each  6 each Topical Daily Narda Bonds, MD   6 each at 09/25/23 1000   dextrose 50 % solution 50 mL  1 ampule Intravenous PRN Montez Morita, PA-C       docusate sodium (COLACE) capsule 100 mg  100 mg Oral BID Montez Morita, PA-C   100 mg at 09/25/23 0848   ferrous sulfate tablet 325 mg  325 mg Oral Katherine Roan., MD   325 mg at 09/24/23 1851   folic acid (FOLVITE) tablet 1 mg  1 mg Oral Daily Zigmund Daniel., MD   1 mg at 09/25/23 0847   furosemide (LASIX) tablet 40 mg  40 mg Oral q AM Zigmund Daniel., MD   40 mg at 09/25/23 0955   HYDROmorphone (DILAUDID) injection 0.5-1 mg  0.5-1 mg Intravenous Q2H PRN Zigmund Daniel., MD   1 mg at 09/25/23 1546   levalbuterol (XOPENEX) nebulizer solution 0.63 mg  0.63 mg Nebulization Q6H PRN Montez Morita, PA-C   0.63 mg at 09/23/23 9604   methocarbamol (ROBAXIN) injection 500 mg  500 mg Intravenous Q8H PRN Zigmund Daniel., MD   500 mg at 09/25/23 1343    metoCLOPramide (REGLAN) tablet 5-10 mg  5-10 mg Oral Q8H PRN Montez Morita, PA-C       Or   metoCLOPramide (REGLAN) injection 5-10 mg  5-10 mg Intravenous Q8H PRN Montez Morita, PA-C       ondansetron [facility]) tablet 4 mg  4 mg Oral Q6H PRN Montez Morita, PA-C       Or   ondansetron [facility]) injection 4 mg  4 mg Intravenous Q6H PRN Montez Morita, PA-C       Oral care mouth rinse  15 mL Mouth Rinse PRN Montez Morita, PA-C       oxyCODONE (Oxy IR/ROXICODONE) immediate release tablet 10 mg  10 mg Oral Q4H PRN Zigmund Daniel., MD  Or   oxyCODONE (Oxy IR/ROXICODONE) immediate release tablet 15 mg  15 mg Oral Q4H PRN Zigmund Daniel., MD   15 mg at 09/25/23 1342   pantoprazole (PROTONIX) EC tablet 40 mg  40 mg Oral BID Montez Morita, PA-C   40 mg at 09/25/23 0847   polyethylene glycol (MIRALAX / GLYCOLAX) packet 17 g  17 g Oral BID Zigmund Daniel., MD   17 g at 09/25/23 0848   senna-docusate (Senokot-S) tablet 1 tablet  1 tablet Oral QHS PRN Montez Morita, PA-C       sildenafil (REVATIO) tablet 20 mg  20 mg Oral TID Montez Morita, PA-C   20 mg at 09/25/23 1547   sodium chloride flush (NS) 0.9 % injection 3 mL  3 mL Intravenous Q12H Montez Morita, PA-C   3 mL at 09/25/23 0950   sodium chloride flush (NS) 0.9 % injection 3 mL  3 mL Intravenous PRN Montez Morita, PA-C       traZODone (DESYREL) tablet 50 mg  50 mg Oral QHS PRN Zigmund Daniel., MD         Discharge Medications: Please see discharge summary for a list of discharge medications.  Relevant Imaging Results:  Relevant Lab Results:   Additional Information SS# [id number] [place]

## 2023-09-25 NOTE — TOC Progression Note (Signed)
Transition of Care (TOC) - Progression Note   Followed up with patient.   Explained have verified her medicare. She has medicare part B which will cover home health RN,PT,OT,SW,aide. However not SNF. Medicare part A covers SNF.   Patient spoke with son last night they do want SNF.   Will confirm if her medicaid offers a SNF benefit .   NCM faxed PCS paperwork , however patient understands long process to get approved.   Patient voiced understanding.     Patient Details  Name: Teresa Wiley MRN: [id number] Date of Birth: 12/01/1955  Transition of [facility]) CM/SW Contact  Meron Bocchino, Adria Devon, RN Phone Number: 09/25/2023, 11:23 AM  Clinical Narrative:       Expected Discharge Plan:  (see note) Barriers to Discharge: Continued Medical Work up  Expected Discharge Plan and Services   Discharge Planning Services: CM Consult Post Acute Care Choice: Home Health Living arrangements for the past 2 months: Single Family Home                 DME Arranged:  (see note)         HH Arranged:  (see note)           Social Determinants of Health (SDOH) Interventions SDOH Screenings   Food Insecurity: No Food Insecurity (09/23/2023)  Housing: Low Risk  (09/24/2023)  Recent Concern: Housing - Medium Risk (09/23/2023)  Transportation Needs: No Transportation Needs (09/23/2023)  Utilities: Not At Risk (09/23/2023)  Social Connections: Unknown (06/26/2022)   Received from [facility], [facility]  Tobacco Use: Medium Risk (09/23/2023)    Readmission Risk Interventions     No data to display

## 2023-09-26 DIAGNOSIS — S82831A Other fracture of upper and lower end of right fibula, initial encounter for closed fracture: Secondary | ICD-10-CM | POA: Diagnosis not present

## 2023-09-26 DIAGNOSIS — I272 Pulmonary hypertension, unspecified: Secondary | ICD-10-CM | POA: Diagnosis not present

## 2023-09-26 DIAGNOSIS — J4489 Other specified chronic obstructive pulmonary disease: Secondary | ICD-10-CM | POA: Diagnosis not present

## 2023-09-26 DIAGNOSIS — J9811 Atelectasis: Secondary | ICD-10-CM | POA: Diagnosis not present

## 2023-09-26 DIAGNOSIS — E669 Obesity, unspecified: Secondary | ICD-10-CM | POA: Diagnosis not present

## 2023-09-26 DIAGNOSIS — Z6834 Body mass index (BMI) 34.0-34.9, adult: Secondary | ICD-10-CM | POA: Diagnosis not present

## 2023-09-26 DIAGNOSIS — W010XXA Fall on same level from slipping, tripping and stumbling without subsequent striking against object, initial encounter: Secondary | ICD-10-CM | POA: Diagnosis present

## 2023-09-26 DIAGNOSIS — D509 Iron deficiency anemia, unspecified: Secondary | ICD-10-CM | POA: Diagnosis not present

## 2023-09-26 DIAGNOSIS — K219 Gastro-esophageal reflux disease without esophagitis: Secondary | ICD-10-CM | POA: Diagnosis not present

## 2023-09-26 DIAGNOSIS — S82241A Displaced spiral fracture of shaft of right tibia, initial encounter for closed fracture: Secondary | ICD-10-CM | POA: Diagnosis not present

## 2023-09-26 DIAGNOSIS — G894 Chronic pain syndrome: Secondary | ICD-10-CM | POA: Diagnosis not present

## 2023-09-26 DIAGNOSIS — W19XXXA Unspecified fall, initial encounter: Secondary | ICD-10-CM | POA: Diagnosis not present

## 2023-09-26 DIAGNOSIS — J9612 Chronic respiratory failure with hypercapnia: Secondary | ICD-10-CM | POA: Diagnosis not present

## 2023-09-26 DIAGNOSIS — F419 Anxiety disorder, unspecified: Secondary | ICD-10-CM | POA: Diagnosis not present

## 2023-09-26 DIAGNOSIS — M25561 Pain in right knee: Secondary | ICD-10-CM | POA: Diagnosis present

## 2023-09-26 DIAGNOSIS — D500 Iron deficiency anemia secondary to blood loss (chronic): Secondary | ICD-10-CM | POA: Diagnosis not present

## 2023-09-26 DIAGNOSIS — Z9981 Dependence on supplemental oxygen: Secondary | ICD-10-CM | POA: Diagnosis not present

## 2023-09-26 DIAGNOSIS — J9809 Other diseases of bronchus, not elsewhere classified: Secondary | ICD-10-CM | POA: Diagnosis not present

## 2023-09-26 DIAGNOSIS — S93439A Sprain of tibiofibular ligament of unspecified ankle, initial encounter: Secondary | ICD-10-CM | POA: Diagnosis not present

## 2023-09-26 DIAGNOSIS — I2694 Multiple subsegmental pulmonary emboli without acute cor pulmonale: Secondary | ICD-10-CM | POA: Diagnosis not present

## 2023-09-26 DIAGNOSIS — Z6835 Body mass index (BMI) 35.0-35.9, adult: Secondary | ICD-10-CM | POA: Diagnosis not present

## 2023-09-26 DIAGNOSIS — E119 Type 2 diabetes mellitus without complications: Secondary | ICD-10-CM | POA: Diagnosis not present

## 2023-09-26 DIAGNOSIS — I10 Essential (primary) hypertension: Secondary | ICD-10-CM | POA: Diagnosis not present

## 2023-09-26 DIAGNOSIS — E538 Deficiency of other specified B group vitamins: Secondary | ICD-10-CM | POA: Diagnosis not present

## 2023-09-26 DIAGNOSIS — J9611 Chronic respiratory failure with hypoxia: Secondary | ICD-10-CM | POA: Diagnosis not present

## 2023-09-26 DIAGNOSIS — G4733 Obstructive sleep apnea (adult) (pediatric): Secondary | ICD-10-CM | POA: Diagnosis not present

## 2023-09-26 DIAGNOSIS — S82861A Displaced Maisonneuve's fracture of right leg, initial encounter for closed fracture: Secondary | ICD-10-CM | POA: Diagnosis not present

## 2023-09-26 LAB — PHOSPHORUS: Phosphorus: 3.8 mg/dL (ref 2.5–4.6)

## 2023-09-26 LAB — CBC
HCT: 32.7 % — ABNORMAL LOW (ref 36.0–46.0)
Hemoglobin: 9.5 g/dL — ABNORMAL LOW (ref 12.0–15.0)
MCH: 25.3 pg — ABNORMAL LOW (ref 26.0–34.0)
MCHC: 29.1 g/dL — ABNORMAL LOW (ref 30.0–36.0)
MCV: 87.2 fL (ref 80.0–100.0)
Platelets: 251 10*3/uL (ref 150–400)
RBC: 3.75 MIL/uL — ABNORMAL LOW (ref 3.87–5.11)
RDW: 15.1 % (ref 11.5–15.5)
WBC: 6.4 10*3/uL (ref 4.0–10.5)
nRBC: 0 % (ref 0.0–0.2)

## 2023-09-26 LAB — MAGNESIUM: Magnesium: 1.9 mg/dL (ref 1.7–2.4)

## 2023-09-26 LAB — BASIC METABOLIC PANEL
Anion gap: 9 (ref 5–15)
BUN: 8 mg/dL (ref 8–23)
CO2: 31 mmol/L (ref 22–32)
Calcium: 8.9 mg/dL (ref 8.9–10.3)
Chloride: 101 mmol/L (ref 98–111)
Creatinine, Ser: 0.79 mg/dL (ref 0.44–1.00)
GFR, Estimated: >60 mL/min (ref >60)
Glucose, Bld: 107 mg/dL — ABNORMAL HIGH (ref 70–99)
Potassium: 3.8 mmol/L (ref 3.5–5.1)
Sodium: 141 mmol/L (ref 135–145)

## 2023-09-26 MED ORDER — OXYCODONE HCL 5 MG PO TABS
20.0000 mg | ORAL_TABLET | ORAL | Status: DC | PRN
  Administered 2023-09-26 – 2023-10-02 (×19): 20 mg via ORAL
  Filled 2023-09-26 (×20): qty 4

## 2023-09-26 MED ORDER — OXYCODONE HCL 5 MG PO TABS
15.0000 mg | ORAL_TABLET | ORAL | Status: DC | PRN
  Administered 2023-09-26 – 2023-10-01 (×3): 15 mg via ORAL
  Filled 2023-09-26 (×3): qty 3

## 2023-09-26 MED ORDER — HYDROMORPHONE HCL 1 MG/ML IJ SOLN
0.5000 mg | INTRAMUSCULAR | Status: DC | PRN
  Administered 2023-09-26 – 2023-09-27 (×4): 0.5 mg via INTRAVENOUS
  Filled 2023-09-26 (×4): qty 0.5

## 2023-09-26 NOTE — Progress Notes (Signed)
PROGRESS NOTE    [name]  NUU:[id number] DOB: [birth year] DOA: 09/23/2023 PCP: Maye Hides, PA  Chief Complaint  Patient presents with   Fall    Brief Narrative:   Teresa Wiley is Teresa Wiley 67 y.o. female with medical history significant of chronic hypoxic and hypercapnic respiratory failure secondary to pulmonary hypertension, bronchomalacia, ASD status post percutaneous closure October 2023, PE in 2017, essential hypertension, DM type II and chronic pain syndrome presented to emergency department after Teresa Wiley fall and complaining of right-sided leg pain. States she was trying to walk back from the bathroom when she noticed her oxygen had become unplugged. She attempted to bend over to fix it and she ended up falling hurting her right lower leg. States she felt dizzy when bending over and somehow ended up on the ground. Does not think she lost consciousness but is not sure. Complains of pain to her right lower leg and ankle. Did hit her head but denies any head pain.  At this moment patient denies any chest pain, chronic changes of her shortness of breath, abdominal pain, nausea, vomiting, constipation, diarrhea, generalized weakness, focal weakness, numbness and tingling.    Assessment & Plan:   Principal Problem:   Fall Active Problems:   Closed right tibial fracture   Closed right fibular fracture   Essential hypertension   Chronic pain syndrome   Pulmonary hypertension (HCC)   Non-insulin dependent type 2 diabetes mellitus (HCC)   Pulmonary embolism (HCC)in 2017   Chronic hypoxic respiratory failure (HCC)   ASD (atrial septal defect) status post closure October 2023   Bronchomalacia  Acute right tibular/fibular fractures Mechnical Fall  Secondary to fall S/p R tibial shaft fracture IM nailing with synthes 9x375 mm statically locked, manual application of stress under fluoroscopy R ankle Pain control, bowel regimen pain uncontrolled -> she's on chronic opiates, will  increase oxy ordered here. Appreciate ortho recs, note pending 10/30  NWB to RLE, CAM boot for support prn  Eliquis ordered per ortho for dvt ppx Foley out today hasn't voided yet? Follow bladder scans.   Chronic anemia Folate Deficiency Iron Deficiency Unclear etiology per chart review but there is concern for chronic blood loss anemia. Baseline hemoglobin of about 10 g/dL.  Low iron, folate - normal B12 Start folate and iron supplementation - s/p IV iron  Needs additional workup of iron def anemia, concern for chronic blood loss anemia outpatient   Atrial septal defect s/p closure (08/2022) Coreg 6.125 mg BID, currently on aspirin monotherapy (completed 6 months of DAPT with aspirin/plavix)  History of PE Not on anticoagulation   Chronic respiratory failure with hypoxia and hypercapnia Pulmonary hypertension Currently on 4 L [place] (she's on -23 L at baseline at home) Sildenafil 20 mg TID She noted some subjective SOB 10/31, resolved today -> follow CXR 10/31 with small R effusion, bibasilar airspace opacities Home lasix  Goal O2 sat over 94%  Primary hypertension Coreg lasix Had hypotension 10/29, resolved - BP on lower side, but ok - follow   Chronic pain syndrome Adjust meds as needed post op Minimize IV pain meds as able (increased PO pain meds)   Diabetes mellitus type 2 A1c 5.7  Obesity Body mass index is 34.8 kg/m.    DVT prophylaxis: eliquis Code Status: full Family Communication: none Disposition:   Status is: Inpatient Remains inpatient appropriate because: need for continued inpatient care/pain control   Consultants:  orthopedics  Procedures:  PROCEDURE:  Procedure(s): 1. RIGHT TIBIAL SHAFT FRACTURE INTRAMEDULLARY  NAILING  with Synthes 9 x 375 mm, statically locked 2. MANUAL APPLICATION OF STRESS UNDER FLUOROSCOPY RIGHT ANKLE  Antimicrobials:  Anti-infectives (From admission, onward)    Start     Dose/Rate Route Frequency Ordered Stop    09/24/23 0600  ceFAZolin (ANCEF) IVPB 2g/100 mL premix        2 g 200 mL/hr over 30 Minutes Intravenous On call to O.R. 09/23/23 1439 09/23/23 1727   09/24/23 0000  ceFAZolin (ANCEF) IVPB 1 g/50 mL premix        1 g 100 mL/hr over 30 Minutes Intravenous Every 6 hours 09/23/23 2000 09/24/23 1930   09/23/23 1440  ceFAZolin (ANCEF) 2-4 GM/100ML-% IVPB       Note to Pharmacy: Lacie Draft Wanda Rideout: cabinet override      09/23/23 1440 09/23/23 1730       Subjective: Continued pain, issues sleeping  Objective: Vitals:   09/25/23 1603 09/25/23 1952 09/25/23 2319 09/26/23 0400  BP: 127/78 114/68 95/78 100/61  Pulse: 88 93 87 83  Resp: 17 16 20 14   Temp: 98.5 F (36.9 C) 98.3 F (36.8 C) 98.1 F (36.7 C) 97.9 F (36.6 C)  TempSrc: Oral Oral Oral Oral  SpO2: 98% 95% 95% 100%  Weight:    110 kg  Height:        Intake/Output Summary (Last 24 hours) at 09/26/2023 0858 Last data filed at 09/26/2023 0300 Gross per 24 hour  Intake 240 ml  Output 1100 ml  Net -860 ml   Filed Weights   09/24/23 0311 09/25/23 0336 09/26/23 0400  Weight: 108.7 kg 107.5 kg 110 kg    Examination:  General: No acute distress. Cardiovascular: RRR Lungs: unlabored with [place] Abdomen: Soft, nontender, nondistended  Neurological: Alert and oriented 3. Moves all extremities 4 with equal strength. Cranial nerves II through XII grossly intact. Extremities: RLE splint   Data Reviewed: I have personally reviewed following labs and imaging studies  CBC: Recent Labs  Lab 09/23/23 0313 09/23/23 0542 09/23/23 1240 09/24/23 0246 09/25/23 0251 09/26/23 0248  WBC 5.6 8.2  --  7.3 5.6 6.4  NEUTROABS 3.4  --   --   --  3.1  --   HGB 11.3* 11.9* 10.9* 10.5* 9.0* 9.5*  HCT 39.4 41.4 38.0 35.9* 30.0* 32.7*  MCV 87.2 89.8  --  86.9 84.7 87.2  PLT 294 307  --  285 253 251    Basic Metabolic Panel: Recent Labs  Lab 09/23/23 0313 09/23/23 0542 09/24/23 0246 09/25/23 0251 09/26/23 0248  NA 139 140 140 138  141  K 3.9 4.6 4.2 3.5 3.8  CL 101 100 101 102 101  CO2 31 29 30 30 31   GLUCOSE 103* 84 131* 94 107*  BUN 7* 8 7* 6* 8  CREATININE 0.83 0.88 0.85 0.83 0.79  CALCIUM 8.9 9.1 8.9 8.7* 8.9  MG  --   --   --  1.9 1.9  PHOS  --   --   --  3.6 3.8    GFR: Estimated Creatinine Clearance: 92.9 mL/min (by C-G formula based on SCr of 0.79 mg/dL).  Liver Function Tests: Recent Labs  Lab 09/23/23 0313 09/23/23 0542 09/24/23 0246 09/25/23 0251  AST 14* 21 14* 13*  ALT 9 9 10 8   ALKPHOS 88 87 86 72  BILITOT 0.4 0.6 0.7 0.7  PROT 7.1 7.3 6.6 5.8*  ALBUMIN 3.2* 3.3* 3.0* 2.7*    CBG: Recent Labs  Lab 09/23/23 1634 09/23/23 1925 09/23/23  2319 09/24/23 0638 09/24/23 1122  GLUCAP 71 118* 126* 115* 133*     Recent Results (from the past 240 hour(s))  Surgical pcr screen     Status: None   Collection Time: 09/23/23  2:41 PM   Specimen: Nasal Mucosa; Nasal Swab  Result Value Ref Range Status   MRSA, PCR NEGATIVE NEGATIVE Final   Staphylococcus aureus NEGATIVE NEGATIVE Final    Comment: (NOTE) The Xpert SA Assay (FDA approved for NASAL specimens in patients 51 years of age and older), is one component of Makiah Clauson comprehensive surveillance program. It is not intended to diagnose infection nor to guide or monitor treatment. Performed at [facility], [address] [place], [place] [address]          Radiology Studies: DG CHEST PORT 1 VIEW  Result Date: 09/25/2023 CLINICAL DATA:  10026 Shortness of breath 10026 EXAM: PORTABLE CHEST 1 VIEW COMPARISON:  CXR 09/23/23 FINDINGS: Small right pleural effusion. No pneumothorax. Normal cardiac contours. There is persistent mediastinal widening hazy bibasilar airspace opacities could represent atelectasis or infection. No radiographically apparent displaced rib fracture. Visualized upper abdomen unremarkable. IMPRESSION: 1. Small right pleural effusion. 2. Hazy bibasilar airspace opacities could represent atelectasis or infection.  Electronically Signed   By: Lorenza Cambridge M.D.   On: 09/25/2023 12:57        Scheduled Meds:  acetaminophen  1,000 mg Oral Q8H   ALPRAZolam  1 mg Oral TID   apixaban  2.5 mg Oral BID   carvedilol  6.25 mg Oral BID   Chlorhexidine Gluconate Cloth  6 each Topical Daily   docusate sodium  100 mg Oral BID   ferrous sulfate  325 mg Oral QODAY   folic acid  1 mg Oral Daily   furosemide  40 mg Oral q AM   pantoprazole  40 mg Oral BID   polyethylene glycol  17 g Oral BID   sildenafil  20 mg Oral TID   sodium chloride flush  3 mL Intravenous Q12H   Continuous Infusions:   LOS: 3 days    Time spent: over 30 min    Lacretia Nicks, MD [facility]   To contact the attending provider between 7A-7P or the covering provider during after hours 7P-7A, please log into the web site [email or web address] and access using universal [place] password for that web site. If you do not have the password, please call the hospital operator.  09/26/2023, 8:58 AM

## 2023-09-26 NOTE — Plan of Care (Signed)

## 2023-09-26 NOTE — TOC Progression Note (Signed)
Transition of Care (TOC) - Progression Note   NCM and SW followed up with patient. Patient does have a SNF bed offer. Provided patient with ratings from medicare.gov ( left at bedside).   Patient considering going home  at discharge, however wants her son to be paid caregiver. NCM explained , NCM faxed paperwork for paid caregiver however it takes a long time to get approved. Her NWB is 6 weeks , approval most likely will be after that.   Patient asked for more time to consider home vs SNF, she is exhausted and would like to sleep.   TOC will continue to follow.  Patient Details  Name: Teresa Wiley MRN: [id number] Date of Birth: June 02, 1956  Transition of Care [facility] CM/SW Contact  Britanee Vanblarcom, Adria Devon, RN Phone Number: 09/26/2023, 11:49 AM  Clinical Narrative:       Expected Discharge Plan:  (see note) Barriers to Discharge: Continued Medical Work up  Expected Discharge Plan and Services   Discharge Planning Services: CM Consult Post Acute Care Choice: Home Health Living arrangements for the past 2 months: Single Family Home                 DME Arranged:  (see note)         HH Arranged:  (see note)           Social Determinants of Health (SDOH) Interventions SDOH Screenings   Food Insecurity: No Food Insecurity (09/23/2023)  Housing: Low Risk  (09/24/2023)  Recent Concern: Housing - Medium Risk (09/23/2023)  Transportation Needs: No Transportation Needs (09/23/2023)  Utilities: Not At Risk (09/23/2023)  Social Connections: Unknown (06/26/2022)   Received from [facility]  Tobacco Use: Medium Risk (09/23/2023)    Readmission Risk Interventions     No data to display

## 2023-09-26 NOTE — Progress Notes (Signed)
Physical Therapy Treatment Patient Details Name: Teresa Wiley MRN: [id number] DOB: [birth year] Today's Date: 09/26/2023   History of Present Illness Patient is a 67 y/o female admitted 09/23/23 due to fall when she reached down and tripped on oxygen tubing.  Found to have R tibia fx and proximal fibular maisonneuve fx.  She underwent IM nail of tibia fx on same day.  PMH includes chronic hypercapnic respiratory failure due to pulmonary hypertension on chronic home O2, bronchomalacia, ASD s/p percutaneous closure 10/23, PE 2017, HTN, DM II and chronic pain syndrome.    PT Comments  Pt received in supine and agreeable to session. Pt reporting significant RLE pain, however is motivated to progress mobility with assist. Pt requires increased time and min A for bed mobility. Pt able to tolerate sitting EOB with RLE support due to increased pain with R knee flexion and dependent position. Pt able to perform LLE exercises before needing to return to supine due to increased pain. Education provided on supine exercises to perform independently and elevating HOB during the day for improved lung function and to prevent dizziness with position changes. Pt continues to benefit from PT services to progress toward functional mobility goals.    If plan is discharge home, recommend the following: A little help with walking and/or transfers;Assistance with cooking/housework;Assist for transportation;Help with stairs or ramp for entrance;A little help with bathing/dressing/bathroom   Can travel by private vehicle     No  Equipment Recommendations  Rolling walker (2 wheels);BSC/3in1    Recommendations for Other Services       Precautions / Restrictions Precautions Precautions: Fall Required Braces or Orthoses: Splint/Cast Splint/Cast: R lower leg Restrictions Weight Bearing Restrictions: Yes RLE Weight Bearing: Non weight bearing     Mobility  Bed Mobility Overal bed mobility: Needs Assistance Bed  Mobility: Supine to Sit, Sit to Supine     Supine to sit: Min assist, HOB elevated, Used rails Sit to supine: Min assist, Used rails, HOB elevated   General bed mobility comments: Min A for RLE support, but pt able to advance. Pt requires RLE support and elevation throughout sitting EOB due to increased pain with knee flexion and dependent position. Pt able to supine scoot towards [facility] without assist    Transfers                   General transfer comment: unable        Balance Overall balance assessment: Needs assistance Sitting-balance support: Bilateral upper extremity supported, Feet supported Sitting balance-Leahy Scale: Fair Sitting balance - Comments: Requires UE support to maintain balance during exercises                                    Cognition Arousal: Alert Behavior During Therapy: Anxious Overall Cognitive Status: Within Functional Limits for tasks assessed                                 General Comments: requires increased encouragement due to pain        Exercises General Exercises - Lower Extremity Long Arc Quad: AROM, Seated, Left, 10 reps Hip Flexion/Marching: AROM, Seated, Left, 10 reps    General Comments        Pertinent Vitals/Pain Pain Assessment Pain Assessment: 0-10 Pain Score: 7  Pain Location: R knee down to the ankle Pain Descriptors /  Indicators: Crying, Guarding, Jabbing, Sharp, Throbbing Pain Intervention(s): Limited activity within patient's tolerance, Monitored during session, Repositioned, Premedicated before session     PT Goals (current goals can now be found in the care plan section) Acute Rehab PT Goals Patient Stated Goal: to return to independent PT Goal Formulation: With patient Time For Goal Achievement: 10/08/23 Progress towards PT goals: Progressing toward goals    Frequency    Min 1X/week       AM-PAC PT "6 Clicks" Mobility   Outcome Measure  Help needed turning  from your back to your side while in a flat bed without using bedrails?: A Little Help needed moving from lying on your back to sitting on the side of a flat bed without using bedrails?: A Little Help needed moving to and from a bed to a chair (including a wheelchair)?: Total Help needed standing up from a chair using your arms (e.g., wheelchair or bedside chair)?: Total Help needed to walk in hospital room?: Total Help needed climbing 3-5 steps with a railing? : Total 6 Click Score: 10    End of Session Equipment Utilized During Treatment: Oxygen Activity Tolerance: Patient limited by pain Patient left: in bed;with call bell/phone within reach;with bed alarm set Nurse Communication: Mobility status PT Visit Diagnosis: Other abnormalities of gait and mobility (R26.89);Muscle weakness (generalized) (M62.81);Pain     Time: 9147-8295 PT Time Calculation (min) (ACUTE ONLY): 23 min  Charges:    $Therapeutic Exercise: 8-22 mins $Therapeutic Activity: 8-22 mins PT General Charges $$ ACUTE PT VISIT: 1 Visit                     Johny Shock, PTA Acute Rehabilitation Services Secure Chat Preferred  [phone]    Johny Shock 09/26/2023, 10:03 AM

## 2023-09-26 NOTE — Progress Notes (Signed)
Orthopaedic Trauma Service Progress Note  Patient ID: Arrianna Catala MRN: [id number] DOB/AGE: 1956/05/25 67 y.o.  Subjective:  C/o R knee and ankle pain which is expected with her injury and surgery A bit anxious this am  Has many stairs at home to navigate    ROS As above  Objective:   VITALS:   Vitals:   09/25/23 1603 09/25/23 1952 09/25/23 2319 09/26/23 0400  BP: 127/78 114/68 95/78 100/61  Pulse: 88 93 87 83  Resp: 17 16 20 14   Temp: 98.5 F (36.9 C) 98.3 F (36.8 C) 98.1 F (36.7 C) 97.9 F (36.6 C)  TempSrc: Oral Oral Oral Oral  SpO2: 98% 95% 95% 100%  Weight:    110 kg  Height:        Estimated body mass index is 34.8 kg/m as calculated from the following:   Height as of this encounter: 5\' 10"  (1.778 m).   Weight as of this encounter: 110 kg.   Intake/Output      10/31 0701 11/01 0700 11/01 0701 11/02 0700   P.O. 240    Total Intake(mL/kg) 240 (2.2)    Urine (mL/kg/hr) 1100 (0.4)    Total Output 1100    Net -860           LABS  Results for orders placed or performed during the hospital encounter of 09/23/23 (from the past 24 hour(s))  Basic metabolic panel     Status: Abnormal   Collection Time: 09/26/23  2:48 AM  Result Value Ref Range   Sodium 141 135 - 145 mmol/L   Potassium 3.8 3.5 - 5.1 mmol/L   Chloride 101 98 - 111 mmol/L   CO2 31 22 - 32 mmol/L   Glucose, Bld 107 (H) 70 - 99 mg/dL   BUN 8 8 - 23 mg/dL   Creatinine, Ser 5.28 0.44 - 1.00 mg/dL   Calcium 8.9 8.9 - 41.3 mg/dL   GFR, Estimated >24 >40 mL/min   Anion gap 9 5 - 15  CBC     Status: Abnormal   Collection Time: 09/26/23  2:48 AM  Result Value Ref Range   WBC 6.4 4.0 - 10.5 K/uL   RBC 3.75 (L) 3.87 - 5.11 MIL/uL   Hemoglobin 9.5 (L) 12.0 - 15.0 g/dL   HCT 10.2 (L) 72.5 - 36.6 %   MCV 87.2 80.0 - 100.0 fL   MCH 25.3 (L) 26.0 - 34.0 pg   MCHC 29.1 (L) 30.0 - 36.0 g/dL   RDW 44.0 34.7 - 42.5 %    Platelets 251 150 - 400 K/uL   nRBC 0.0 0.0 - 0.2 %  Magnesium     Status: None   Collection Time: 09/26/23  2:48 AM  Result Value Ref Range   Magnesium 1.9 1.7 - 2.4 mg/dL  Phosphorus     Status: None   Collection Time: 09/26/23  2:48 AM  Result Value Ref Range   Phosphorus 3.8 2.5 - 4.6 mg/dL     PHYSICAL EXAM:   Gen: in bed, a little tearful and anxious but in no other distress  Lungs: unlabored Cardiac: reg Ext:       Right Lower Extremity Splint fitting well  Dressing is clean, dry and intact             Extremity  is warm             No DCT             Compartments are soft             No pain out of proportion with passive stretching of his toes or ankle             DPN, SPN, TN sensory functions are intact             EHL, FHL, lesser toe motor functions intact             + DP pulse  Tolerates passive knee ROM from 0-40 degrees   Assessment/Plan: 3 Days Post-Op   Principal Problem:   Fall Active Problems:   Essential hypertension   Chronic pain syndrome   Pulmonary hypertension (HCC)   Non-insulin dependent type 2 diabetes mellitus (HCC)   Pulmonary embolism (HCC)in 2017   Chronic hypoxic respiratory failure (HCC)   ASD (atrial septal defect) status post closure October 2023   Closed right tibial fracture   Closed right fibular fracture   Bronchomalacia   Anti-infectives (From admission, onward)    Start     Dose/Rate Route Frequency Ordered Stop   09/24/23 0600  ceFAZolin (ANCEF) IVPB 2g/100 mL premix        2 g 200 mL/hr over 30 Minutes Intravenous On call to O.R. 09/23/23 1439 09/23/23 1727   09/24/23 0000  ceFAZolin (ANCEF) IVPB 1 g/50 mL premix        1 g 100 mL/hr over 30 Minutes Intravenous Every 6 hours 09/23/23 2000 09/24/23 1930   09/23/23 1440  ceFAZolin (ANCEF) 2-4 GM/100ML-% IVPB       Note to Pharmacy: Lacie Draft A: cabinet override      09/23/23 1440 09/23/23 1730     .  POD/HD#: 28  67 y/o female s/p fall wit R tibia  fracture and R proximal fibula fracture   -closed R tibia fracture s/p IMN, stable R fibula fracture stress stable R ankle  Weightbearing NWB R leg                ROM/Activity                         No ankle motion as she is splinted                         Unrestricted knee motion                       Wound care                         Splint to remain on for 2 weeks then convert to boot                Therapy evals    - Pain management:             Multimodal   Chronic pain management about 5 years    - DVT/PE prophylaxis:             Eliquis    - ID:              Periop abx    - Metabolic Bone Disease:             Hypervitaminosis D  Dc vitamin d supplementation                          Calcium levels look ok      - Dispo:             Ortho issues addressed              Therapies  SNF  Follow up with ortho in 10-14 days    Mearl Latin, PA-C [phone] (C) 09/26/2023, 10:31 AM  [facility] [address] [place] [address] [phone] Val [phone] (F)    After 5pm and on the weekends please log on to Amion, go to orthopaedics and the look under the [facility] Call for the provider(s) on call. You can also call our office at [phone] and then follow the prompts to be connected to the call team.  Patient ID: Concha Norway, female   DOB: 09-Aug-1956, 67 y.o.   MRN: [id number]

## 2023-09-26 NOTE — Plan of Care (Signed)
  Problem: Education: Goal: Knowledge of General Education information will improve Description: Including pain rating scale, medication(s)/side effects and non-pharmacologic comfort measures Outcome: Progressing   Problem: Health Behavior/Discharge Planning: Goal: Ability to manage health-related needs will improve Outcome: Not Progressing   Problem: Clinical Measurements: Goal: Ability to maintain clinical measurements within normal limits will improve Outcome: Progressing Goal: Will remain free from infection Outcome: Progressing Goal: Diagnostic test results will improve Outcome: Progressing Goal: Respiratory complications will improve Outcome: Progressing Goal: Cardiovascular complication will be avoided Outcome: Progressing   Problem: Activity: Goal: Risk for activity intolerance will decrease Outcome: Not Progressing   Problem: Nutrition: Goal: Adequate nutrition will be maintained Outcome: Progressing   Problem: Coping: Goal: Level of anxiety will decrease Outcome: Not Progressing   Problem: Elimination: Goal: Will not experience complications related to bowel motility Outcome: Progressing Goal: Will not experience complications related to urinary retention Outcome: Progressing   Problem: Pain Management: Goal: General experience of comfort will improve Outcome: Progressing   Problem: Safety: Goal: Ability to remain free from injury will improve Outcome: Progressing   Problem: Skin Integrity: Goal: Risk for impaired skin integrity will decrease Outcome: Progressing

## 2023-09-27 ENCOUNTER — Encounter (HOSPITAL_COMMUNITY): Payer: Self-pay | Admitting: Internal Medicine

## 2023-09-27 DIAGNOSIS — W19XXXA Unspecified fall, initial encounter: Secondary | ICD-10-CM | POA: Diagnosis not present

## 2023-09-27 DIAGNOSIS — I272 Pulmonary hypertension, unspecified: Secondary | ICD-10-CM | POA: Diagnosis not present

## 2023-09-27 DIAGNOSIS — S82241A Displaced spiral fracture of shaft of right tibia, initial encounter for closed fracture: Secondary | ICD-10-CM | POA: Diagnosis not present

## 2023-09-27 DIAGNOSIS — S82831A Other fracture of upper and lower end of right fibula, initial encounter for closed fracture: Secondary | ICD-10-CM | POA: Diagnosis not present

## 2023-09-27 DIAGNOSIS — M25561 Pain in right knee: Secondary | ICD-10-CM | POA: Diagnosis not present

## 2023-09-27 MED ORDER — HYDROMORPHONE HCL 1 MG/ML IJ SOLN
0.5000 mg | Freq: Four times a day (QID) | INTRAMUSCULAR | Status: DC | PRN
  Administered 2023-09-27 – 2023-09-29 (×5): 0.5 mg via INTRAVENOUS
  Filled 2023-09-27 (×5): qty 0.5

## 2023-09-27 MED ORDER — HYDROMORPHONE HCL 1 MG/ML IJ SOLN: 0.5000 mg | INTRAMUSCULAR | Status: DC | PRN

## 2023-09-27 NOTE — Progress Notes (Signed)
Occupational Therapy Treatment Patient Details Name: Teresa Wiley MRN: [id number] DOB: Jan 12, 1956 Today's Date: 09/27/2023   History of present illness Patient is a 67 y/o female admitted 09/23/23 due to fall when she reached down and tripped on oxygen tubing.  Found to have R tibia fx and proximal fibular maisonneuve fx.  She underwent IM nail of tibia fx on same day.  PMH includes chronic hypercapnic respiratory failure due to pulmonary hypertension on chronic home O2, bronchomalacia, ASD s/p percutaneous closure 10/23, PE 2017, HTN, DM II and chronic pain syndrome.   OT comments  Pt. Seen for skilled OT treatment session.  Fearful and hesitant for all participation but willing.  Max encouragement and emotional support as pt. Was tearful throughout.  Heavy focus on pt. Driven movements and initiation to aide in her feeling more comfortable moving the RLE and also trusting her abilities with all mobility.  Pt. Able to complete lb dressing in long sitting for sock on L foot.  Bed mobility including guiding bles to eob and oob to the floor with min a.  Sit/stand x 1 with mod a and good adherence with nwb.  Cues throughout for good tech. And posture. Back to bed required max a for management of rle back into bed.  Pt. Remained tearful.  Reviewed all of her accomplishments and how she had made great strides today.  She stated tears not fully related to therapy she had other things going on in her life/mind.  Cont. Emotional support provided.  Pt. In better spirits at end of session.  Cont. To progress mobility and adls next session as pt. Able.         If plan is discharge home, recommend the following:  A lot of help with walking and/or transfers;A lot of help with bathing/dressing/bathroom;Assistance with cooking/housework;Assistance with feeding;Assist for transportation;Help with stairs or ramp for entrance   Equipment Recommendations  BSC/3in1;Wheelchair (measurements OT);Wheelchair cushion  (measurements OT)    Recommendations for Other Services      Precautions / Restrictions Precautions Precautions: Fall Required Braces or Orthoses: Splint/Cast Splint/Cast: R lower leg Restrictions Weight Bearing Restrictions: Yes RLE Weight Bearing: Non weight bearing       Mobility Bed Mobility Overal bed mobility: Needs Assistance Bed Mobility: Supine to Sit, Sit to Supine     Supine to sit: Min assist, HOB elevated, Used rails Sit to supine: Min assist, Used rails, HOB elevated   General bed mobility comments: pt. able to sit in long sitting, educated and able to return demo moving LLE, then guiding RLE with both hands towards eob. as we got to eob therapist asst. managed RLE in a total of 4 increments of lowering until it reached and sat on floor with pt. seated eob.  for back to bed pt. Able to scoot towards hob x2 with therapist asst. Supporting RLE as she scooted.  max a to bring rle into bed but pt. able to perform same way we did to eob and guide each le over in the bed until centered.  using ues to lift hips and scoot over to desired position    Transfers Overall transfer level: Needs assistance Equipment used: Rolling walker (2 wheels) Transfers: Sit to/from Stand Sit to Stand: Mod assist           General transfer comment: height of bed slightly elevated for ease, place RLE on therapist asst. foot for reminder for NWB and to make pt. feel more comfortable.  pt. able to push through  bues and transition into standing while maintaining nwb.  stood approx. 10-15 seconds before requesting to sit.  cues for hand placement but was able to reach back for bed prior to sitting down.  delcined sitting eob for eating lunch wanting back to bed.     Balance                                           ADL either performed or assessed with clinical judgement   ADL                       Lower Body Dressing: Set up;Bed level                  General ADL Comments: able to sit in long sitting and bend LLE at the knee to don sock    Extremity/Trunk Assessment              Vision       Perception     Praxis      Cognition Arousal: Alert Behavior During Therapy: Anxious (emotional, crying throughout session) Overall Cognitive Status: Within Functional Limits for tasks assessed                                 General Comments: requires increased encouragement due to pain, tearful        Exercises      Shoulder Instructions       General Comments      Pertinent Vitals/ Pain       Pain Assessment Pain Assessment: Faces Faces Pain Scale: Hurts whole lot Pain Location: R knee down to the ankle Pain Descriptors / Indicators: Crying, Guarding, Jabbing, Sharp, Throbbing Pain Intervention(s): Limited activity within patient's tolerance, Monitored during session, Repositioned, Premedicated before session, Patient requesting pain meds-RN notified, Other (comment) (pt. was telling the CNA as i came in that she wanted "the shot" referring to pain medication, he stated he would pass along the the rn)  Home Living                                          Prior Functioning/Environment              Frequency  Min 1X/week        Progress Toward Goals  OT Goals(current goals can now be found in the care plan section)  Progress towards OT goals: Progressing toward goals     Plan      Co-evaluation                 AM-PAC OT "6 Clicks" Daily Activity     Outcome Measure   Help from another person eating meals?: None Help from another person taking care of personal grooming?: A Little Help from another person toileting, which includes using toliet, bedpan, or urinal?: Total Help from another person bathing (including washing, rinsing, drying)?: A Lot Help from another person to put on and taking off regular upper body clothing?: A Little Help from another person  to put on and taking off regular lower body clothing?: Total 6 Click Score: 14    End of Session Equipment Utilized During Treatment: Gait  belt;Oxygen  OT Visit Diagnosis: Unsteadiness on feet (R26.81);Other abnormalities of gait and mobility (R26.89);Pain   Activity Tolerance Patient limited by pain   Patient Left in bed;with call bell/phone within reach;with bed alarm set;Other (comment) (4 bed rails up, they were up prior to arrival also so i returned them back all up)   Nurse Communication Other (comment) (rn states ok to work with pt. today for therapy session)        Time: 1201-1228 OT Time Calculation (min): 27 min  Charges: OT General Charges $OT Visit: 1 Visit OT Treatments $Self Care/Home Management : 23-37 mins  Boneta Lucks, COTA/L Acute Rehabilitation [phone]   Alessandra Bevels Lorraine-COTA/L 09/27/2023, 12:53 PM

## 2023-09-27 NOTE — Progress Notes (Addendum)
PROGRESS NOTE    Teresa Wiley  QMV:[id number] DOB: 1956-02-26 DOA: 09/23/2023 PCP: Maye Hides, PA  Chief Complaint  Patient presents with   Fall    Brief Narrative:   Teresa Wiley is Teresa Wiley 67 y.o. female with medical history significant of chronic hypoxic and hypercapnic respiratory failure secondary to pulmonary hypertension, bronchomalacia, ASD status post percutaneous closure October 2023, PE in 2017, essential hypertension, DM type II and chronic pain syndrome presented to emergency department after [place] fall and complaining of right-sided leg pain. States she was trying to walk back from the bathroom when she noticed her oxygen had become unplugged. She attempted to bend over to fix it and she ended up falling hurting her right lower leg. States she felt dizzy when bending over and somehow ended up on the ground. Does not think she lost consciousness but is not sure. Complains of pain to her right lower leg and ankle. Did hit her head but denies any head pain.  At this moment patient denies any chest pain, chronic changes of her shortness of breath, abdominal pain, nausea, vomiting, constipation, diarrhea, generalized weakness, focal weakness, numbness and tingling.   Assessment & Plan:   Principal Problem:   Fall Active Problems:   Closed right tibial fracture   Closed right fibular fracture   Essential hypertension   Chronic pain syndrome   Pulmonary hypertension (HCC)   Non-insulin dependent type 2 diabetes mellitus (HCC)   Pulmonary embolism (HCC)in 2017   Chronic hypoxic respiratory failure (HCC)   ASD (atrial septal defect) status post closure October 2023   Bronchomalacia  Acute right tibular/fibular fractures Mechnical Fall  Secondary to fall S/p R tibial shaft fracture IM nailing with synthes 9x375 mm statically locked, manual application of stress under fluoroscopy R ankle Pain control, bowel regimen pain uncontrolled -> she's on chronic opiates, will  increase oxy ordered here - minimize IV pain meds. Appreciate ortho recs, note pending 10/30  NWB to RLE, CAM boot for support prn  Eliquis ordered per ortho for dvt ppx   Chronic anemia Folate Deficiency Iron Deficiency Unclear etiology per chart review but there is concern for chronic blood loss anemia. Baseline hemoglobin of about 10 g/dL.  Low iron, folate - normal B12 Start folate and iron supplementation - s/p IV iron  Needs additional workup of iron def anemia, concern for chronic blood loss anemia outpatient   Urinary Retention  After foley removed, foley replaced 11/1 -> will leave this in place Teresa Wiley few days before trying to remove again   Elevated Vit D  D/c supplementation   Atrial septal defect s/p closure (08/2022) Coreg 6.125 mg BID, completed 6 months of DAPT with aspirin/plavix  History of PE Not on anticoagulation   Chronic respiratory failure with hypoxia and hypercapnia Pulmonary hypertension Currently on 4 L [place] (she's on -23 L at baseline at home) Sildenafil 20 mg TID She noted some subjective SOB 10/31, resolved -> follow CXR 10/31 with small R effusion, bibasilar airspace opacities Home lasix  Goal O2 sat over 94%  Primary hypertension Coreg lasix Had hypotension 10/29, resolved - BP on lower side, but ok - follow   Chronic pain syndrome Adjust meds as needed post op Minimize IV pain meds as able (increased PO pain meds)   Diabetes mellitus type 2 A1c 5.7  Obesity Body mass index is 34.67 kg/m.    DVT prophylaxis: eliquis Code Status: full Family Communication: none Disposition:   Status is: Inpatient Remains inpatient appropriate because:  need for continued inpatient care/pain control   Consultants:  orthopedics  Procedures:  PROCEDURE:  Procedure(s): 1. RIGHT TIBIAL SHAFT FRACTURE INTRAMEDULLARY NAILING  with Synthes 9 x 375 mm, statically locked 2. MANUAL APPLICATION OF STRESS UNDER FLUOROSCOPY RIGHT ANKLE  Antimicrobials:   Anti-infectives (From admission, onward)    Start     Dose/Rate Route Frequency Ordered Stop   09/24/23 0600  ceFAZolin (ANCEF) IVPB 2g/100 mL premix        2 g 200 mL/hr over 30 Minutes Intravenous On call to O.R. 09/23/23 1439 09/23/23 1727   09/24/23 0000  ceFAZolin (ANCEF) IVPB 1 g/50 mL premix        1 g 100 mL/hr over 30 Minutes Intravenous Every 6 hours 09/23/23 2000 09/24/23 1930   09/23/23 1440  ceFAZolin (ANCEF) 2-4 GM/100ML-% IVPB       Note to Pharmacy: Teresa Wiley: cabinet override      09/23/23 1440 09/23/23 1730       Subjective: Pain and issues sleeping Wants to continue IV pain meds  Objective: Vitals:   09/26/23 1932 09/26/23 2230 09/27/23 0354 09/27/23 0737  BP: 103/62 121/79 105/69 138/78  Pulse:    96  Resp:    16  Temp: 98.1 F (36.7 C) 98.5 F (36.9 C) 98.8 F (37.1 C) 98.7 F (37.1 C)  TempSrc: Oral Oral Oral Oral  SpO2:   100% 100%  Weight:   109.6 kg   Height:        Intake/Output Summary (Last 24 hours) at 09/27/2023 0908 Last data filed at 09/27/2023 0400 Gross per 24 hour  Intake 473 ml  Output 1792 ml  Net -1319 ml   Filed Weights   09/25/23 0336 09/26/23 0400 09/27/23 0354  Weight: 107.5 kg 110 kg 109.6 kg    Examination:  General: No acute distress. Anxious. Cardiovascular: RRR Lungs: unlabored Neurological: Alert and oriented 3. Moves all extremities 4 with equal strength. Cranial nerves II through XII grossly intact. Extremities: RLE splint   Data Reviewed: I have personally reviewed following labs and imaging studies  CBC: Recent Labs  Lab 09/23/23 0313 09/23/23 0542 09/23/23 1240 09/24/23 0246 09/25/23 0251 09/26/23 0248  WBC 5.6 8.2  --  7.3 5.6 6.4  NEUTROABS 3.4  --   --   --  3.1  --   HGB 11.3* 11.9* 10.9* 10.5* 9.0* 9.5*  HCT 39.4 41.4 38.0 35.9* 30.0* 32.7*  MCV 87.2 89.8  --  86.9 84.7 87.2  PLT 294 307  --  285 253 251    Basic Metabolic Panel: Recent Labs  Lab 09/23/23 0313  09/23/23 0542 09/24/23 0246 09/25/23 0251 09/26/23 0248  NA 139 140 140 138 141  K 3.9 4.6 4.2 3.5 3.8  CL 101 100 101 102 101  CO2 31 29 30 30 31   GLUCOSE 103* 84 131* 94 107*  BUN 7* 8 7* 6* 8  CREATININE 0.83 0.88 0.85 0.83 0.79  CALCIUM 8.9 9.1 8.9 8.7* 8.9  MG  --   --   --  1.9 1.9  PHOS  --   --   --  3.6 3.8    GFR: Estimated Creatinine Clearance: 92.7 mL/min (by C-G formula based on SCr of 0.79 mg/dL).  Liver Function Tests: Recent Labs  Lab 09/23/23 0313 09/23/23 0542 09/24/23 0246 09/25/23 0251  AST 14* 21 14* 13*  ALT 9 9 10 8   ALKPHOS 88 87 86 72  BILITOT 0.4 0.6 0.7 0.7  PROT  7.1 7.3 6.6 5.8*  ALBUMIN 3.2* 3.3* 3.0* 2.7*    CBG: Recent Labs  Lab 09/23/23 1634 09/23/23 1925 09/23/23 2319 09/24/23 0638 09/24/23 1122  GLUCAP 71 118* 126* 115* 133*     Recent Results (from the past 240 hour(s))  Surgical pcr screen     Status: None   Collection Time: 09/23/23  2:41 PM   Specimen: Nasal Mucosa; Nasal Swab  Result Value Ref Range Status   MRSA, PCR NEGATIVE NEGATIVE Final   Staphylococcus aureus NEGATIVE NEGATIVE Final    Comment: (NOTE) The Xpert SA Assay (FDA approved for NASAL specimens in patients 7 years of age and older), is one component of Teresa Wiley comprehensive surveillance program. It is not intended to diagnose infection nor to guide or monitor treatment. Performed at [facility], [address] [place], [place] [address]          Radiology Studies: No results found.      Scheduled Meds:  acetaminophen  1,000 mg Oral Q8H   ALPRAZolam  1 mg Oral TID   apixaban  2.5 mg Oral BID   carvedilol  6.25 mg Oral BID   Chlorhexidine Gluconate Cloth  6 each Topical Daily   docusate sodium  100 mg Oral BID   ferrous sulfate  325 mg Oral QODAY   folic acid  1 mg Oral Daily   furosemide  40 mg Oral q AM   pantoprazole  40 mg Oral BID   polyethylene glycol  17 g Oral BID   sildenafil  20 mg Oral TID   sodium chloride flush   3 mL Intravenous Q12H   Continuous Infusions:   LOS: 4 days    Time spent: over 30 min    Lacretia Nicks, MD [facility]   To contact the attending provider between 7A-7P or the covering provider during after hours 7P-7A, please log into the web site [email or web address] and access using universal [place] password for that web site. If you do not have the password, please call the hospital operator.  09/27/2023, 9:08 AM

## 2023-09-27 NOTE — Plan of Care (Signed)

## 2023-09-28 DIAGNOSIS — W19XXXA Unspecified fall, initial encounter: Secondary | ICD-10-CM | POA: Diagnosis not present

## 2023-09-28 DIAGNOSIS — I272 Pulmonary hypertension, unspecified: Secondary | ICD-10-CM | POA: Diagnosis not present

## 2023-09-28 DIAGNOSIS — S82831A Other fracture of upper and lower end of right fibula, initial encounter for closed fracture: Secondary | ICD-10-CM | POA: Diagnosis not present

## 2023-09-28 NOTE — Progress Notes (Signed)
PROGRESS NOTE    Teresa Wiley  RUE:[id number] DOB: [birth year] DOA: 09/23/2023 PCP: Maye Hides, PA  Chief Complaint  Patient presents with   Fall    Brief Narrative:   Teresa Wiley is Teresa Wiley 67 y.o. female with medical history significant of chronic hypoxic and hypercapnic respiratory failure secondary to pulmonary hypertension, bronchomalacia, ASD status post percutaneous closure October 2023, PE in 2017, essential hypertension, DM type II and chronic pain syndrome presented to emergency department after [place] Far[place] fall and complaining of right-sided leg pain. States she was trying to walk back from the bathroom when she noticed her oxygen had become unplugged. She attempted to bend over to fix it and she ended up falling hurting her right lower leg. States she felt dizzy when bending over and somehow ended up on the ground. Does not think she lost consciousness but is not sure. Complains of pain to her right lower leg and ankle. Did hit her head but denies any head pain.  At this moment patient denies any chest pain, chronic changes of her shortness of breath, abdominal pain, nausea, vomiting, constipation, diarrhea, generalized weakness, focal weakness, numbness and tingling.   Assessment & Plan:   Principal Problem:   Fall Active Problems:   Closed right tibial fracture   Closed right fibular fracture   Essential hypertension   Chronic pain syndrome   Pulmonary hypertension (HCC)   Non-insulin dependent type 2 diabetes mellitus (HCC)   Pulmonary embolism (HCC)in 2017   Chronic hypoxic respiratory failure (HCC)   ASD (atrial septal defect) status post closure October 2023   Bronchomalacia  Acute right tibular/fibular fractures Mechnical Fall  Secondary to fall S/p R tibial shaft fracture IM nailing with synthes 9x375 mm statically locked, manual application of stress under fluoroscopy R ankle Pain control, bowel regimen pain uncontrolled -> she's on chronic opiates, will  increase oxy ordered here - minimize IV pain meds. Appreciate ortho recs, note pending 10/30  NWB to RLE, CAM boot for support prn  Eliquis ordered per ortho for dvt ppx Plan for SNF    Chronic anemia Folate Deficiency Iron Deficiency Unclear etiology per chart review but there is concern for chronic blood loss anemia. Baseline hemoglobin of about 10 g/dL.  Low iron, folate - normal B12 Start folate and iron supplementation - s/p IV iron  Needs additional workup of iron def anemia, concern for chronic blood loss anemia outpatient   Urinary Retention  After foley removed, foley replaced 11/1 -> will leave this in place Namita [place] few days before trying to remove again   Elevated Vit D  D/c supplementation at discharge  Atrial septal defect s/p closure (08/2022) Coreg 6.125 mg BID, completed 6 months of DAPT with aspirin/plavix  History of PE Not on anticoagulation   Chronic respiratory failure with hypoxia and hypercapnia Pulmonary hypertension Currently on 4 L [place] (she's on -23 L at baseline at home) Sildenafil 20 mg TID She noted some subjective SOB 10/31, resolved -> follow CXR 10/31 with small R effusion, bibasilar airspace opacities - repeat outpatient  Home lasix  Goal O2 sat over 94%  Primary hypertension Coreg lasix Had hypotension 10/29, resolved   Chronic pain syndrome Adjust meds as needed post op Minimize IV pain meds as able (increased PO pain meds)   Diabetes mellitus type 2 A1c 5.7  Obesity Body mass index is 33.28 kg/m.    DVT prophylaxis: eliquis Code Status: full Family Communication: none Disposition:   Status is: Inpatient Remains inpatient appropriate because:  need for continued inpatient care/pain control - agreeable to SNF   Consultants:  orthopedics  Procedures:  PROCEDURE:  Procedure(s): 1. RIGHT TIBIAL SHAFT FRACTURE INTRAMEDULLARY NAILING  with Synthes 9 x 375 mm, statically locked 2. MANUAL APPLICATION OF STRESS UNDER FLUOROSCOPY  RIGHT ANKLE  Antimicrobials:  Anti-infectives (From admission, onward)    Start     Dose/Rate Route Frequency Ordered Stop   09/24/23 0600  ceFAZolin (ANCEF) IVPB 2g/100 mL premix        2 g 200 mL/hr over 30 Minutes Intravenous On call to O.R. 09/23/23 1439 09/23/23 1727   09/24/23 0000  ceFAZolin (ANCEF) IVPB 1 g/50 mL premix        1 g 100 mL/hr over 30 Minutes Intravenous Every 6 hours 09/23/23 2000 09/24/23 1930   09/23/23 1440  ceFAZolin (ANCEF) 2-4 GM/100ML-% IVPB       Note to Pharmacy: Lacie Draft [place] Ruud: cabinet override      09/23/23 1440 09/23/23 1730       Subjective: C/o issues with sleep   Objective: Vitals:   09/28/23 0348 09/28/23 0400 09/28/23 0600 09/28/23 0716  BP:    123/77  Pulse:  91 92 94  Resp: 20 20 20 15   Temp:    98.6 F (37 C)  TempSrc: Oral   Oral  SpO2:  93% 93% 93%  Weight: 105.2 kg     Height:        Intake/Output Summary (Last 24 hours) at 09/28/2023 0853 Last data filed at 09/28/2023 0349 Gross per 24 hour  Intake 960 ml  Output 2600 ml  Net -1640 ml   Filed Weights   09/26/23 0400 09/27/23 0354 09/28/23 0348  Weight: 110 kg 109.6 kg 105.2 kg    Examination:  General: No acute distress. Cardiovascular: RRR Lungs: unlabored Neurological: Alert and oriented 3. Moves all extremities 4 with equal strength. Cranial nerves II through XII grossly intact. Extremities: RLE splint   Data Reviewed: I have personally reviewed following labs and imaging studies  CBC: Recent Labs  Lab 09/23/23 0313 09/23/23 0542 09/23/23 1240 09/24/23 0246 09/25/23 0251 09/26/23 0248  WBC 5.6 8.2  --  7.3 5.6 6.4  NEUTROABS 3.4  --   --   --  3.1  --   HGB 11.3* 11.9* 10.9* 10.5* 9.0* 9.5*  HCT 39.4 41.4 38.0 35.9* 30.0* 32.7*  MCV 87.2 89.8  --  86.9 84.7 87.2  PLT 294 307  --  285 253 251    Basic Metabolic Panel: Recent Labs  Lab 09/23/23 0313 09/23/23 0542 09/24/23 0246 09/25/23 0251 09/26/23 0248  NA 139 140 140 138 141  K  3.9 4.6 4.2 3.5 3.8  CL 101 100 101 102 101  CO2 31 29 30 30 31   GLUCOSE 103* 84 131* 94 107*  BUN 7* 8 7* 6* 8  CREATININE 0.83 0.88 0.85 0.83 0.79  CALCIUM 8.9 9.1 8.9 8.7* 8.9  MG  --   --   --  1.9 1.9  PHOS  --   --   --  3.6 3.8    GFR: Estimated Creatinine Clearance: 90.9 mL/min (by C-G formula based on SCr of 0.79 mg/dL).  Liver Function Tests: Recent Labs  Lab 09/23/23 0313 09/23/23 0542 09/24/23 0246 09/25/23 0251  AST 14* 21 14* 13*  ALT 9 9 10 8   ALKPHOS 88 87 86 72  BILITOT 0.4 0.6 0.7 0.7  PROT 7.1 7.3 6.6 5.8*  ALBUMIN 3.2* 3.3* 3.0* 2.7*  CBG: Recent Labs  Lab 09/23/23 1634 09/23/23 1925 09/23/23 2319 09/24/23 0638 09/24/23 1122  GLUCAP 71 118* 126* 115* 133*     Recent Results (from the past 240 hour(s))  Surgical pcr screen     Status: None   Collection Time: 09/23/23  2:41 PM   Specimen: Nasal Mucosa; Nasal Swab  Result Value Ref Range Status   MRSA, PCR NEGATIVE NEGATIVE Final   Staphylococcus aureus NEGATIVE NEGATIVE Final    Comment: (NOTE) The Xpert SA Assay (FDA approved for NASAL specimens in patients 14 years of age and older), is one component of Rambo Sarafian comprehensive surveillance program. It is not intended to diagnose infection nor to guide or monitor treatment. Performed at [facility], [address], [place], [place] [address]          Radiology Studies: No results found.      Scheduled Meds:  acetaminophen  1,000 mg Oral Q8H   ALPRAZolam  1 mg Oral TID   apixaban  2.5 mg Oral BID   carvedilol  6.25 mg Oral BID   Chlorhexidine Gluconate Cloth  6 each Topical Daily   docusate sodium  100 mg Oral BID   ferrous sulfate  325 mg Oral QODAY   folic acid  1 mg Oral Daily   furosemide  40 mg Oral q AM   pantoprazole  40 mg Oral BID   polyethylene glycol  17 g Oral BID   sildenafil  20 mg Oral TID   sodium chloride flush  3 mL Intravenous Q12H   Continuous Infusions:   LOS: 5 days    Time spent: over 30  min    Lacretia Nicks, MD [facility]   To contact the attending provider between 7A-7P or the covering provider during after hours 7P-7A, please log into the web site [email or web address] and access using universal [place] password for that web site. If you do not have the password, please call the hospital operator.  09/28/2023, 8:53 AM

## 2023-09-28 NOTE — TOC Progression Note (Signed)
Transition of Care [facility]) - Progression Note    Patient Details  Name: Teresa Wiley MRN: [id number] Date of Birth: Sep 16, 1956  [facility] CM/SW Contact  Maree Krabbe, LCSW Phone Number: 09/28/2023, 9:54 AM  Clinical Narrative:   SW spoke with pt and pt has not looked over the SNF list as she can not find it. Pt's RN is going to assist. Pt has Medicaid and has no bed offers yet. SW extended bed search.    Expected Discharge Plan:  (see note) Barriers to Discharge: Continued Medical Work up  Expected Discharge Plan and Services   Discharge Planning Services: CM Consult Post Acute Care Choice: Home Health Living arrangements for the past 2 months: Single Family Home                 DME Arranged:  (see note)         HH Arranged:  (see note)           Social Determinants of Health (SDOH) Interventions SDOH Screenings   Food Insecurity: No Food Insecurity (09/23/2023)  Housing: Low Risk  (09/24/2023)  Recent Concern: Housing - Medium Risk (09/23/2023)  Transportation Needs: No Transportation Needs (09/23/2023)  Utilities: Not At Risk (09/23/2023)  Social Connections: Unknown (06/26/2022)   [facility] from [facility], [facility]  Tobacco Use: Medium Risk (09/23/2023)    Readmission Risk Interventions     No data to display

## 2023-09-28 NOTE — Plan of Care (Signed)
  Problem: Health Behavior/Discharge Planning: Goal: Ability to manage health-related needs will improve Outcome: Progressing   Problem: Clinical Measurements: Goal: Ability to maintain clinical measurements within normal limits will improve Outcome: Progressing   Problem: Clinical Measurements: Goal: Will remain free from infection Outcome: Progressing   

## 2023-09-29 DIAGNOSIS — S82202A Unspecified fracture of shaft of left tibia, initial encounter for closed fracture: Secondary | ICD-10-CM

## 2023-09-29 DIAGNOSIS — S82241A Displaced spiral fracture of shaft of right tibia, initial encounter for closed fracture: Secondary | ICD-10-CM | POA: Diagnosis not present

## 2023-09-29 DIAGNOSIS — W19XXXA Unspecified fall, initial encounter: Secondary | ICD-10-CM | POA: Diagnosis not present

## 2023-09-29 DIAGNOSIS — S82201A Unspecified fracture of shaft of right tibia, initial encounter for closed fracture: Secondary | ICD-10-CM

## 2023-09-29 DIAGNOSIS — M25561 Pain in right knee: Secondary | ICD-10-CM | POA: Diagnosis not present

## 2023-09-29 MED ORDER — METHOCARBAMOL 500 MG PO TABS
500.0000 mg | ORAL_TABLET | Freq: Three times a day (TID) | ORAL | Status: DC
  Administered 2023-09-29 – 2023-10-02 (×10): 500 mg via ORAL
  Filled 2023-09-29 (×10): qty 1

## 2023-09-29 MED ORDER — KETOROLAC TROMETHAMINE 10 MG PO TABS
10.0000 mg | ORAL_TABLET | Freq: Three times a day (TID) | ORAL | Status: AC
  Administered 2023-09-29 – 2023-10-01 (×8): 10 mg via ORAL
  Filled 2023-09-29 (×10): qty 1

## 2023-09-29 NOTE — Progress Notes (Signed)
Physical Therapy Treatment Patient Details Name: Teresa Wiley MRN: [id number] DOB: [birth year] Today's Date: 09/29/2023   History of Present Illness Patient is a 67 y/o female admitted 09/23/23 due to fall when she reached down and tripped on oxygen tubing.  Found to have R tibia fx and proximal fibular maisonneuve fx.  She underwent IM nail of tibia fx on same day.  PMH includes chronic hypercapnic respiratory failure due to pulmonary hypertension on chronic home O2, bronchomalacia, ASD s/p percutaneous closure 10/23, PE 2017, HTN, DM II and chronic pain syndrome.    PT Comments  Patient with slow improvements.  Some knee flexion today during knee extension exercise with pillow at knee.  She scoots herself well to EOB with A for the R LE.  She was unable to pivot to recliner in the room (no drop arm) safely despite demonstration, cues and mod A.  She will likely do better to wheelchair with removeable armrest or to drop arm recliner.  She is concerned about paying for rehab with her disability check as cannot pay her bills.  PT will continue to follow.     If plan is discharge home, recommend the following: A little help with walking and/or transfers;Assistance with cooking/housework;Assist for transportation;Help with stairs or ramp for entrance;A little help with bathing/dressing/bathroom   Can travel by private vehicle     No  Equipment Recommendations  Wheelchair (measurements PT);Wheelchair cushion (measurements PT);BSC/3in1 (drop arm BSC)    Recommendations for Other Services       Precautions / Restrictions Precautions Precautions: Fall Required Braces or Orthoses: Splint/Cast Splint/Cast: R lower leg Restrictions Weight Bearing Restrictions: Yes RLE Weight Bearing: Non weight bearing     Mobility  Bed Mobility Overal bed mobility: Needs Assistance Bed Mobility: Supine to Sit     Supine to sit: Min assist, HOB elevated, Used rails Sit to supine: Min assist   General  bed mobility comments: using hands on bed to lift trunk and scoot hips with PT supporting R LE in both directions to EOB and back to bed    Transfers Overall transfer level: Needs assistance   Transfers: Bed to chair/wheelchair/BSC             General transfer comment: demonstrated squat pivot to recliner and pt attempted with mod A though not able to clear hips to fully pivot and fear she may scoot to floor so pt cued to scoot back on bed with A and aborted transfer with +1 A for safety.  Patient tolerated foot on floor for sitting EOB though still not allowing much knee flexion    Ambulation/Gait                   Stairs             Wheelchair Mobility     Tilt Bed    Modified Rankin (Stroke Patients Only)       Balance Overall balance assessment: Needs assistance Sitting-balance support: Bilateral upper extremity supported, Feet supported Sitting balance-Leahy Scale: Fair Sitting balance - Comments: on EOB with both feet on floor                                    Cognition Arousal: Alert Behavior During Therapy: WFL for tasks assessed/performed Overall Cognitive Status: Within Functional Limits for tasks assessed  Exercises General Exercises - Lower Extremity Quad Sets: AROM, Right, Supine, 5 reps Short Arc Quad: AROM, AAROM, Right, Supine, 5 reps Other Exercises Other Exercises: in supine with pillow under knee to allow flexion though pt not comfortable at first improved during SAQ    General Comments General comments (skin integrity, edema, etc.): SpO2 97% on 2L, HR stable BP once back in supine 80's/50's though pt denies light headedness; RN aware      Pertinent Vitals/Pain Pain Assessment Pain Score: 8  Pain Location: R hip down to ankle Pain Descriptors / Indicators: Grimacing, Guarding Pain Intervention(s): Monitored during session, Repositioned, RN gave pain  meds during session, Ice applied    Home Living                          Prior Function            PT Goals (current goals can now be found in the care plan section) Progress towards PT goals: Progressing toward goals    Frequency    Min 1X/week      PT Plan      Co-evaluation              AM-PAC PT "6 Clicks" Mobility   Outcome Measure  Help needed turning from your back to your side while in a flat bed without using bedrails?: A Little Help needed moving from lying on your back to sitting on the side of a flat bed without using bedrails?: A Little Help needed moving to and from a bed to a chair (including a wheelchair)?: Total Help needed standing up from a chair using your arms (e.g., wheelchair or bedside chair)?: Total Help needed to walk in hospital room?: Total Help needed climbing 3-5 steps with a railing? : Total 6 Click Score: 10    End of Session Equipment Utilized During Treatment: Gait belt Activity Tolerance: Patient limited by fatigue Patient left: in bed;with call bell/phone within reach   PT Visit Diagnosis: Other abnormalities of gait and mobility (R26.89);Muscle weakness (generalized) (M62.81);Pain Pain - Right/Left: Right Pain - part of body: Knee;Leg     Time: 1610-9604 PT Time Calculation (min) (ACUTE ONLY): 28 min  Charges:    $Therapeutic Exercise: 8-22 mins $Therapeutic Activity: 8-22 mins PT General Charges $$ ACUTE PT VISIT: 1 Visit                     Sheran Lawless, PT Acute Rehabilitation Services Office:[phone] 09/29/2023    Elray Mcgregor 09/29/2023, 3:48 PM

## 2023-09-29 NOTE — Progress Notes (Signed)
PROGRESS NOTE    Teresa Wiley  ZOX:[id number] DOB: 03/29/1956 DOA: 09/23/2023 PCP: Maye Hides, PA  Chief Complaint  Patient presents with   Fall    Brief Narrative:   Teresa Wiley is Teresa Wiley 67 y.o. female with medical history significant of chronic hypoxic and hypercapnic respiratory failure secondary to pulmonary hypertension, bronchomalacia, ASD status post percutaneous closure October 2023, PE in 2017, essential hypertension, DM type II and chronic pain syndrome presented to emergency department after Teresa Wiley fall and complaining of right-sided leg pain. States she was trying to walk back from the bathroom when she noticed her oxygen had become unplugged. She attempted to bend over to fix it and she ended up falling hurting her right lower leg. States she felt dizzy when bending over and somehow ended up on the ground. Does not think she lost consciousness but is not sure. Complains of pain to her right lower leg and ankle. Did hit her head but denies any head pain.  At this moment patient denies any chest pain, chronic changes of her shortness of breath, abdominal pain, nausea, vomiting, constipation, diarrhea, generalized weakness, focal weakness, numbness and tingling.   Assessment & Plan:   Principal Problem:   Fall Active Problems:   Closed right tibial fracture   Closed right fibular fracture   Essential hypertension   Chronic pain syndrome   Pulmonary hypertension (HCC)   Non-insulin dependent type 2 diabetes mellitus (HCC)   Pulmonary embolism (HCC)in 2017   Chronic hypoxic respiratory failure (HCC)   ASD (atrial septal defect) status post closure October 2023   Bronchomalacia  Acute right tibular/fibular fractures Mechnical Fall  Secondary to fall S/p R tibial shaft fracture IM nailing with synthes 9x375 mm statically locked, manual application of stress under fluoroscopy R ankle Pain control, bowel regimen pain uncontrolled -> she's on chronic opiates, will  increase oxy ordered here - minimize IV pain meds. Appreciate ortho recs, note pending 10/30  NWB to RLE, CAM boot for support prn  Eliquis ordered per ortho for dvt ppx Plan for SNF    Chronic anemia Folate Deficiency Iron Deficiency Unclear etiology per chart review but there is concern for chronic blood loss anemia. Baseline hemoglobin of about 10 g/dL.  Low iron, folate - normal B12 Start folate and iron supplementation - s/p IV iron  Needs additional workup of iron def anemia, concern for chronic blood loss anemia outpatient   Urinary Retention  After foley removed, foley replaced 11/1 -> will leave this in place Teresa Wiley few days before trying to remove again   Elevated Vit D  D/c supplementation at discharge  Atrial septal defect s/p closure (08/2022) Coreg 6.125 mg BID, completed 6 months of DAPT with aspirin/plavix  History of PE Not on anticoagulation   Chronic respiratory failure with hypoxia and hypercapnia Pulmonary hypertension Currently on 4 L [place] (she's on -23 L at baseline at home) Sildenafil 20 mg TID She noted some subjective SOB 10/31, resolved -> follow CXR 10/31 with small R effusion, bibasilar airspace opacities - repeat outpatient  Home lasix  Goal O2 sat over 94%  Primary hypertension Coreg lasix Had hypotension 10/29, resolved   Chronic pain syndrome Adjust meds as needed post op Minimize IV pain meds as able (increased PO pain meds)   Diabetes mellitus type 2 A1c 5.7  Obesity Body mass index is 33.12 kg/m.    DVT prophylaxis: eliquis Code Status: full Family Communication: none Disposition:   Status is: Inpatient Remains inpatient appropriate because:  need for continued inpatient care/pain control - agreeable to SNF   Consultants:  orthopedics  Procedures:  PROCEDURE:  Procedure(s): 1. RIGHT TIBIAL SHAFT FRACTURE INTRAMEDULLARY NAILING  with Synthes 9 x 375 mm, statically locked 2. MANUAL APPLICATION OF STRESS UNDER FLUOROSCOPY  RIGHT ANKLE  Antimicrobials:  Anti-infectives (From admission, onward)    Start     Dose/Rate Route Frequency Ordered Stop   09/24/23 0600  ceFAZolin (ANCEF) IVPB 2g/100 mL premix        2 g 200 mL/hr over 30 Minutes Intravenous On call to O.R. 09/23/23 1439 09/23/23 1727   09/24/23 0000  ceFAZolin (ANCEF) IVPB 1 g/50 mL premix        1 g 100 mL/hr over 30 Minutes Intravenous Every 6 hours 09/23/23 2000 09/24/23 1930   09/23/23 1440  ceFAZolin (ANCEF) 2-4 GM/100ML-% IVPB       Note to Pharmacy: Teresa Wiley Teresa Wiley: cabinet override      09/23/23 1440 09/23/23 1730       Subjective: No new complaints  Objective: Vitals:   09/29/23 0442 09/29/23 0751 09/29/23 1139 09/29/23 1526  BP: 118/69 114/79 122/75   Pulse: 88 93 88 88  Resp: 16 12 12 13   Temp: 98.5 F (36.9 C) 97.9 F (36.6 C) 97.6 F (36.4 C) 97.7 F (36.5 C)  TempSrc: Oral Axillary Oral Oral  SpO2: 97% 97% 97% 98%  Weight: 104.7 kg     Height:        Intake/Output Summary (Last 24 hours) at 09/29/2023 1830 Last data filed at 09/29/2023 1525 Gross per 24 hour  Intake 480 ml  Output 900 ml  Net -420 ml   Filed Weights   09/27/23 0354 09/28/23 0348 09/29/23 0442  Weight: 109.6 kg 105.2 kg 104.7 kg    Examination:  General: No acute distress. Cardiovascular: RRr Lungs: unlabored Neurological: Alert and oriented 3. Moves all extremities 4 with equal strength. Cranial nerves II through XII grossly intact. Extremities: RLE splint    Data Reviewed: I have personally reviewed following labs and imaging studies  CBC: Recent Labs  Lab 09/23/23 0313 09/23/23 0542 09/23/23 1240 09/24/23 0246 09/25/23 0251 09/26/23 0248  WBC 5.6 8.2  --  7.3 5.6 6.4  NEUTROABS 3.4  --   --   --  3.1  --   HGB 11.3* 11.9* 10.9* 10.5* 9.0* 9.5*  HCT 39.4 41.4 38.0 35.9* 30.0* 32.7*  MCV 87.2 89.8  --  86.9 84.7 87.2  PLT 294 307  --  285 253 251    Basic Metabolic Panel: Recent Labs  Lab 09/23/23 0313  09/23/23 0542 09/24/23 0246 09/25/23 0251 09/26/23 0248  NA 139 140 140 138 141  K 3.9 4.6 4.2 3.5 3.8  CL 101 100 101 102 101  CO2 31 29 30 30 31   GLUCOSE 103* 84 131* 94 107*  BUN 7* 8 7* 6* 8  CREATININE 0.83 0.88 0.85 0.83 0.79  CALCIUM 8.9 9.1 8.9 8.7* 8.9  MG  --   --   --  1.9 1.9  PHOS  --   --   --  3.6 3.8    GFR: Estimated Creatinine Clearance: 90.6 mL/min (by C-G formula based on SCr of 0.79 mg/dL).  Liver Function Tests: Recent Labs  Lab 09/23/23 0313 09/23/23 0542 09/24/23 0246 09/25/23 0251  AST 14* 21 14* 13*  ALT 9 9 10 8   ALKPHOS 88 87 86 72  BILITOT 0.4 0.6 0.7 0.7  PROT 7.1 7.3 6.6  5.8*  ALBUMIN 3.2* 3.3* 3.0* 2.7*    CBG: Recent Labs  Lab 09/23/23 1634 09/23/23 1925 09/23/23 2319 09/24/23 0638 09/24/23 1122  GLUCAP 71 118* 126* 115* 133*     Recent Results (from the past 240 hour(s))  Surgical pcr screen     Status: None   Collection Time: 09/23/23  2:41 PM   Specimen: Nasal Mucosa; Nasal Swab  Result Value Ref Range Status   MRSA, PCR NEGATIVE NEGATIVE Final   Staphylococcus aureus NEGATIVE NEGATIVE Final    Comment: (NOTE) The Xpert SA Assay (FDA approved for NASAL specimens in patients 81 years of age and older), is one component of Teresa Wiley comprehensive surveillance program. It is not intended to diagnose infection nor to guide or monitor treatment. Performed at [facility], [address] [place], [place] [address]          Radiology Studies: No results found.      Scheduled Meds:  ALPRAZolam  1 mg Oral TID   apixaban  2.5 mg Oral BID   carvedilol  6.25 mg Oral BID   Chlorhexidine Gluconate Cloth  6 each Topical Daily   docusate sodium  100 mg Oral BID   ferrous sulfate  325 mg Oral QODAY   folic acid  1 mg Oral Daily   furosemide  40 mg Oral q AM   ketorolac  10 mg Oral Q8H   methocarbamol  500 mg Oral TID   pantoprazole  40 mg Oral BID   polyethylene glycol  17 g Oral BID   sildenafil  20 mg Oral TID    sodium chloride flush  3 mL Intravenous Q12H   Continuous Infusions:   LOS: 6 days    Time spent: over 30 min    Lacretia Nicks, MD [facility]   To contact the attending provider between 7A-7P or the covering provider during after hours 7P-7A, please log into the web site [email or web address] and access using universal [place] password for that web site. If you do not have the password, please call the hospital operator.  09/29/2023, 6:30 PM

## 2023-09-29 NOTE — Plan of Care (Signed)

## 2023-09-29 NOTE — TOC Progression Note (Signed)
Transition of Care [facility]) - Progression Note    Patient Details  Name: Teresa Wiley MRN: [id number] Date of Birth: 12/10/1955  Transition of Care [facility]) CM/SW Contact  Carley Hammed, LCSW Phone Number: 09/29/2023, 11:57 AM  Clinical Narrative:     CSW met with pt at bedside to discuss disposition. Pt was advised that two facilities had offered, one in [place] and one in [place]. Pt also advised that she will need to turn her Medicaid check over to the facility she chooses to go to. Pt asked how she will pay for her rent and bills, and states she will speak with her family about it. CSW updated CM to ongoing discussions. TOC will continue to follow.   Expected Discharge Plan:  (see note) Barriers to Discharge: Continued Medical Work up  Expected Discharge Plan and Services   Discharge Planning Services: CM Consult Post Acute Care Choice: Home Health Living arrangements for the past 2 months: Single Family Home                 DME Arranged:  (see note)         HH Arranged:  (see note)           Social Determinants of Health (SDOH) Interventions SDOH Screenings   Food Insecurity: No Food Insecurity (09/23/2023)  Housing: Low Risk  (09/24/2023)  Recent Concern: Housing - Medium Risk (09/23/2023)  Transportation Needs: No Transportation Needs (09/23/2023)  Utilities: Not At Risk (09/23/2023)  Social Connections: Unknown (06/26/2022)   Received from [facility], [facility]  Tobacco Use: Medium Risk (09/23/2023)    Readmission Risk Interventions     No data to display

## 2023-09-29 NOTE — Plan of Care (Signed)
  Problem: Education: Goal: Knowledge of General Education information will improve Description: Including pain rating scale, medication(s)/side effects and non-pharmacologic comfort measures Outcome: Progressing   Problem: Clinical Measurements: Goal: Diagnostic test results will improve Outcome: Progressing   Problem: Nutrition: Goal: Adequate nutrition will be maintained Outcome: Progressing   Problem: Elimination: Goal: Will not experience complications related to bowel motility Outcome: Progressing   Problem: Safety: Goal: Ability to remain free from injury will improve Outcome: Progressing

## 2023-09-30 ENCOUNTER — Inpatient Hospital Stay (HOSPITAL_COMMUNITY): Payer: Medicare Other

## 2023-09-30 DIAGNOSIS — J9611 Chronic respiratory failure with hypoxia: Secondary | ICD-10-CM | POA: Diagnosis not present

## 2023-09-30 DIAGNOSIS — M25561 Pain in right knee: Secondary | ICD-10-CM | POA: Diagnosis not present

## 2023-09-30 DIAGNOSIS — S82831A Other fracture of upper and lower end of right fibula, initial encounter for closed fracture: Secondary | ICD-10-CM

## 2023-09-30 DIAGNOSIS — W19XXXA Unspecified fall, initial encounter: Secondary | ICD-10-CM

## 2023-09-30 DIAGNOSIS — S82241A Displaced spiral fracture of shaft of right tibia, initial encounter for closed fracture: Secondary | ICD-10-CM | POA: Diagnosis not present

## 2023-09-30 LAB — CBC
HCT: 32.1 % — ABNORMAL LOW (ref 36.0–46.0)
Hemoglobin: 9.5 g/dL — ABNORMAL LOW (ref 12.0–15.0)
MCH: 26 pg (ref 26.0–34.0)
MCHC: 29.6 g/dL — ABNORMAL LOW (ref 30.0–36.0)
MCV: 87.7 fL (ref 80.0–100.0)
Platelets: 299 10*3/uL (ref 150–400)
RBC: 3.66 MIL/uL — ABNORMAL LOW (ref 3.87–5.11)
RDW: 15.5 % (ref 11.5–15.5)
WBC: 6.7 10*3/uL (ref 4.0–10.5)
nRBC: 0 % (ref 0.0–0.2)

## 2023-09-30 LAB — BASIC METABOLIC PANEL
Anion gap: 9 (ref 5–15)
BUN: 15 mg/dL (ref 8–23)
CO2: 31 mmol/L (ref 22–32)
Calcium: 8.6 mg/dL — ABNORMAL LOW (ref 8.9–10.3)
Chloride: 98 mmol/L (ref 98–111)
Creatinine, Ser: 1.16 mg/dL — ABNORMAL HIGH (ref 0.44–1.00)
GFR, Estimated: 52 mL/min — ABNORMAL LOW (ref >60)
Glucose, Bld: 127 mg/dL — ABNORMAL HIGH (ref 70–99)
Potassium: 3.5 mmol/L (ref 3.5–5.1)
Sodium: 138 mmol/L (ref 135–145)

## 2023-09-30 MED ORDER — GABAPENTIN 300 MG PO CAPS
600.0000 mg | ORAL_CAPSULE | Freq: Three times a day (TID) | ORAL | Status: DC
  Administered 2023-09-30 – 2023-10-02 (×6): 600 mg via ORAL
  Filled 2023-09-30 (×6): qty 2

## 2023-09-30 NOTE — Progress Notes (Signed)
Physical Therapy Treatment Patient Details Name: Teresa Wiley MRN: [id number] DOB: 1956-08-03 Today's Date: 09/30/2023   History of Present Illness Patient is a 67 y/o female admitted 09/23/23 due to fall when she reached down and tripped on oxygen tubing.  Found to have R tibia fx and proximal fibular maisonneuve fx.  She underwent IM nail of tibia fx on same day.  PMH includes chronic hypercapnic respiratory failure due to pulmonary hypertension on chronic home O2, bronchomalacia, ASD s/p percutaneous closure 10/23, PE 2017, HTN, DM II and chronic pain syndrome.    PT Comments  Patient able to transition to wheelchair today via lateral scoot using slide board.  She needed help for set up and wheelchair parts and cues for brakes, etc.  She was quite distracted reporting exhaustion and eventually refused wheelchair mobility once in chair.  She states her son could potentially come for caregiver training, though she was having hard time transitioning her thoughts from figuring out finances for rehab to going home with help.  Note that she would need to get ramp for home entry versus ambulance transport.  PT will continue to follow.     If plan is discharge home, recommend the following: A little help with walking and/or transfers;Assistance with cooking/housework;Assist for transportation;Help with stairs or ramp for entrance;A little help with bathing/dressing/bathroom   Can travel by private vehicle     Yes (potentially)  Equipment Recommendations  Wheelchair (measurements PT);Wheelchair cushion (measurements PT);BSC/3in1;Other (comment) (slide board)    Recommendations for Other Services       Precautions / Restrictions Precautions Precautions: Fall Required Braces or Orthoses: Splint/Cast Splint/Cast: R lower leg Restrictions RLE Weight Bearing: Non weight bearing     Mobility  Bed Mobility Overal bed mobility: Needs Assistance Bed Mobility: Supine to Sit, Sit to Supine      Supine to sit: HOB elevated, Used rails, Min assist Sit to supine: Min assist, Used rails   General bed mobility comments: assist for R LE to EOB and back to supine; cues for technique to scoot    Transfers Overall transfer level: Needs assistance Equipment used: Sliding board Transfers: Bed to chair/wheelchair/BSC            Lateral/Scoot Transfers: Min assist General transfer comment: used slide board to move bed to wheelchair with removeable armrests    Ambulation/Gait                   Psychologist, counselling mobility: Yes Wheelchair propulsion: Both upper extremities Distance: 2' Mudlogger Details (indicate cue type and reason): assist for legrest, armrests and max cues for brake management, encouraged even short distance mobility in room, but pt declined due to "exhausted" able to move forward a couple feet for proximity for transfer back to bed with cues for hand placement on wheels   Tilt Bed    Modified Rankin (Stroke Patients Only)       Balance Overall balance assessment: Needs assistance   Sitting balance-Leahy Scale: Good                                      Cognition Arousal: Alert Behavior During Therapy: Agitated Overall Cognitive Status: Impaired/Different from baseline Area of Impairment: Following commands, Problem solving, Attention  Current Attention Level: Sustained   Following Commands: Follows one step commands with increased time, Follows one step commands inconsistently     Problem Solving: Slow processing, Decreased initiation, Difficulty sequencing General Comments: limited focus this pm as she reports her mind is on various issues related to discharge; needing repeated instructions and slow to initiate; reports "exhausted"        Exercises      General Comments General comments (skin integrity, edema, etc.): on  2L O2.  Discussed option for home with partner or son to assist.  Patient with difficulty transitioning her thoughts as reports had been trying to figure how to pay her bills when she needed to sign her check over to rehab.  She states will speak with son to see if he can come for caregiver training.  Also initiated discussion about ramp for home entry/exit; but pt unable to process at this time.      Pertinent Vitals/Pain Pain Assessment Faces Pain Scale: Hurts whole lot Pain Location: R hip down to ankle Pain Descriptors / Indicators: Grimacing, Guarding Pain Intervention(s): Monitored during session, Repositioned, RN gave pain meds during session    Home Living                          Prior Function            PT Goals (current goals can now be found in the care plan section) Progress towards PT goals: Progressing toward goals    Frequency    Min 1X/week      PT Plan      Co-evaluation              AM-PAC PT "6 Clicks" Mobility   Outcome Measure  Help needed turning from your back to your side while in a flat bed without using bedrails?: A Little Help needed moving from lying on your back to sitting on the side of a flat bed without using bedrails?: A Little Help needed moving to and from a bed to a chair (including a wheelchair)?: A Little Help needed standing up from a chair using your arms (e.g., wheelchair or bedside chair)?: Total Help needed to walk in hospital room?: Total Help needed climbing 3-5 steps with a railing? : Total 6 Click Score: 12    End of Session Equipment Utilized During Treatment: Other (comment);Oxygen (slide board) Activity Tolerance: Patient limited by fatigue Patient left: in bed;with call bell/phone within reach   PT Visit Diagnosis: Other abnormalities of gait and mobility (R26.89);Muscle weakness (generalized) (M62.81);Pain Pain - Right/Left: Right Pain - part of body: Knee;Leg     Time: 1340-1411 PT Time  Calculation (min) (ACUTE ONLY): 31 min  Charges:    $Therapeutic Activity: 23-37 mins PT General Charges $$ ACUTE PT VISIT: 1 Visit                     Sheran Lawless, PT Acute Rehabilitation Services Office:[phone] 09/30/2023    Elray Mcgregor 09/30/2023, 5:16 PM

## 2023-09-30 NOTE — Progress Notes (Signed)
PROGRESS NOTE    Teresa Wiley  BMW:[id number] DOB: 1956-07-18 DOA: 09/23/2023 PCP: Maye Hides, PA  Chief Complaint  Patient presents with   Fall    Brief Narrative:   Teresa Wiley is Teresa Wiley 67 y.o. female with medical history significant of chronic hypoxic and hypercapnic respiratory failure secondary to pulmonary hypertension, bronchomalacia, ASD status post percutaneous closure October 2023, PE in 2017, essential hypertension, DM type II and chronic pain syndrome presented to emergency department after Teresa Wiley fall and complaining of right-sided leg pain.   She was found to have Teresa Wiley right tib/fib fracture.  Now s/p surgery by orthopedics.    Discharge is pending.  Therapy recommending snf, but concerns at this point from her side regarding finances (needing to hand over check).  She may need to go home with family support, will need to ensure family is able to manage.   Assessment & Plan:   Principal Problem:   Fall Active Problems:   Closed right tibial fracture   Closed right fibular fracture   Essential hypertension   Chronic pain syndrome   Pulmonary hypertension (HCC)   Non-insulin dependent type 2 diabetes mellitus (HCC)   Pulmonary embolism (HCC)in 2017   Chronic hypoxic respiratory failure (HCC)   ASD (atrial septal defect) status post closure October 2023   Bronchomalacia  Disposition Therapy recommending SNF, but she's worried about handing over her check to the facility (she won't be able to pay her other bills).  Called her wife, Teresa Wiley.  Teresa Wiley said they'll have to talk as Teresa Wiley family to see if they'll be able to care for her at home.  I asked therapy to call family to let them know how they'd need to help Teresa Wiley.    Acute right tibular/fibular fractures Mechnical Fall  Secondary to fall S/p R tibial shaft fracture IM nailing with synthes 9x375 mm statically locked, manual application of stress under fluoroscopy R ankle Pain control, bowel regimen pain  uncontrolled -> she's on chronic opiates, will increase oxy ordered here - minimize IV pain meds. Appreciate ortho recs, note pending 10/30  NWB to RLE, CAM boot for support prn  Eliquis ordered per ortho for dvt ppx Plan for SNF    Chronic anemia Folate Deficiency Iron Deficiency Unclear etiology per chart review but there is concern for chronic blood loss anemia. Baseline hemoglobin of about 10 g/dL.  Low iron, folate - normal B12 Start folate and iron supplementation - s/p IV iron  Needs additional workup of iron def anemia, concern for chronic blood loss anemia outpatient   Urinary Retention  After foley removed, foley replaced 11/1 -> will leave this in place Teresa Wiley few days before trying to remove again   Elevated Vit D  D/c supplementation at discharge  Atrial septal defect s/p closure (08/2022) Coreg 6.125 mg BID, completed 6 months of DAPT with aspirin/plavix  History of PE Not on anticoagulation   Chronic respiratory failure with hypoxia and hypercapnia Pulmonary hypertension Currently on 4 L Teresa Wiley (she's on -23 L at baseline at home) Sildenafil 20 mg TID She noted some subjective SOB 10/31, resolved -> follow CXR 10/31 with small R effusion, bibasilar airspace opacities - repeat outpatient  Home lasix  Goal O2 sat over 94%  Primary hypertension Coreg lasix Had hypotension 10/29, resolved   Chronic pain syndrome Adjust meds as needed post op  Diabetes mellitus type 2 A1c 5.7  Obesity Body mass index is 33.75 kg/m.    DVT prophylaxis: eliquis Code Status:  full Family Communication: none Disposition:   Status is: Inpatient Remains inpatient appropriate because: need for continued inpatient care/pain control - agreeable to SNF   Consultants:  orthopedics  Procedures:  PROCEDURE:  Procedure(s): 1. RIGHT TIBIAL SHAFT FRACTURE INTRAMEDULLARY NAILING  with Synthes 9 x 375 mm, statically locked 2. MANUAL APPLICATION OF STRESS UNDER FLUOROSCOPY RIGHT  ANKLE  Antimicrobials:  Anti-infectives (From admission, onward)    Start     Dose/Rate Route Frequency Ordered Stop   09/24/23 0600  ceFAZolin (ANCEF) IVPB 2g/100 mL premix        2 g 200 mL/hr over 30 Minutes Intravenous On call to O.R. 09/23/23 1439 09/23/23 1727   09/24/23 0000  ceFAZolin (ANCEF) IVPB 1 g/50 mL premix        1 g 100 mL/hr over 30 Minutes Intravenous Every 6 hours 09/23/23 2000 09/24/23 1930   09/23/23 1440  ceFAZolin (ANCEF) 2-4 GM/100ML-% IVPB       Note to Pharmacy: Teresa Wiley Teresa Wiley: cabinet override      09/23/23 1440 09/23/23 1730       Subjective: C/o pain, says she banged leg last night  Objective: Vitals:   09/29/23 1935 09/29/23 2326 09/30/23 0523 09/30/23 0755  BP: 110/63 123/74 105/68 112/66  Pulse: 81 85 88 88  Resp: 20 (!) 21 18 13   Temp: 98.5 F (36.9 C) 98.6 F (37 C) 98.4 F (36.9 C) 97.6 F (36.4 C)  TempSrc: Oral Oral Oral Oral  SpO2: 100%  91% 95%  Weight:   106.7 kg   Height:        Intake/Output Summary (Last 24 hours) at 09/30/2023 0945 Last data filed at 09/30/2023 0520 Gross per 24 hour  Intake --  Output 800 ml  Net -800 ml   Filed Weights   09/28/23 0348 09/29/23 0442 09/30/23 0523  Weight: 105.2 kg 104.7 kg 106.7 kg    Examination:  General: No acute distress. Cardiovascular: RRR Lungs: unlabored Neurological: Alert and oriented 3. Moves all extremities 4 with equal strength. Cranial nerves II through XII grossly intact. Extremities: RLE splint     Data Reviewed: I have personally reviewed following labs and imaging studies  CBC: Recent Labs  Lab 09/23/23 1240 09/24/23 0246 09/25/23 0251 09/26/23 0248 09/30/23 0245  WBC  --  7.3 5.6 6.4 6.7  NEUTROABS  --   --  3.1  --   --   HGB 10.9* 10.5* 9.0* 9.5* 9.5*  HCT 38.0 35.9* 30.0* 32.7* 32.1*  MCV  --  86.9 84.7 87.2 87.7  PLT  --  285 253 251 299    Basic Metabolic Panel: Recent Labs  Lab 09/24/23 0246 09/25/23 0251 09/26/23 0248  09/30/23 0245  NA 140 138 141 138  K 4.2 3.5 3.8 3.5  CL 101 102 101 98  CO2 30 30 31 31   GLUCOSE 131* 94 107* 127*  BUN 7* 6* 8 15  CREATININE 0.85 0.83 0.79 1.16*  CALCIUM 8.9 8.7* 8.9 8.6*  MG  --  1.9 1.9  --   PHOS  --  3.6 3.8  --     GFR: Estimated Creatinine Clearance: 63.1 mL/min (Teresa Wiley) (by C-G formula based on SCr of 1.16 mg/dL (H)).  Liver Function Tests: Recent Labs  Lab 09/24/23 0246 09/25/23 0251  AST 14* 13*  ALT 10 8  ALKPHOS 86 72  BILITOT 0.7 0.7  PROT 6.6 5.8*  ALBUMIN 3.0* 2.7*    CBG: Recent Labs  Lab 09/23/23 1634 09/23/23  1925 09/23/23 2319 09/24/23 0638 09/24/23 1122  GLUCAP 71 118* 126* 115* 133*     Recent Results (from the past 240 hour(s))  Surgical pcr screen     Status: None   Collection Time: 09/23/23  2:41 PM   Specimen: Nasal Mucosa; Nasal Swab  Result Value Ref Range Status   MRSA, PCR NEGATIVE NEGATIVE Final   Staphylococcus aureus NEGATIVE NEGATIVE Final    Comment: (NOTE) The Xpert SA Assay (FDA approved for NASAL specimens in patients 42 years of age and older), is one component of Teresa Wiley comprehensive surveillance program. It is not intended to diagnose infection nor to guide or monitor treatment. Performed at [facility], [address] [place], [place] [address]          Radiology Studies: No results found.      Scheduled Meds:  ALPRAZolam  1 mg Oral TID   apixaban  2.5 mg Oral BID   carvedilol  6.25 mg Oral BID   Chlorhexidine Gluconate Cloth  6 each Topical Daily   docusate sodium  100 mg Oral BID   ferrous sulfate  325 mg Oral QODAY   folic acid  1 mg Oral Daily   furosemide  40 mg Oral q AM   ketorolac  10 mg Oral Q8H   methocarbamol  500 mg Oral TID   pantoprazole  40 mg Oral BID   polyethylene glycol  17 g Oral BID   sildenafil  20 mg Oral TID   sodium chloride flush  3 mL Intravenous Q12H   Continuous Infusions:   LOS: 7 days    Time spent: over 30 min    Lacretia Nicks,  MD [facility]   To contact the attending provider between 7A-7P or the covering provider during after hours 7P-7A, please log into the web site [email or web address] and access using universal [place] password for that web site. If you do not have the password, please call the hospital operator.  09/30/2023, 9:45 AM

## 2023-09-30 NOTE — Plan of Care (Signed)

## 2023-10-01 DIAGNOSIS — S82241A Displaced spiral fracture of shaft of right tibia, initial encounter for closed fracture: Secondary | ICD-10-CM | POA: Diagnosis not present

## 2023-10-01 DIAGNOSIS — S82831A Other fracture of upper and lower end of right fibula, initial encounter for closed fracture: Secondary | ICD-10-CM | POA: Diagnosis not present

## 2023-10-01 DIAGNOSIS — M25561 Pain in right knee: Secondary | ICD-10-CM | POA: Diagnosis not present

## 2023-10-01 DIAGNOSIS — S82874A Nondisplaced pilon fracture of right tibia, initial encounter for closed fracture: Secondary | ICD-10-CM | POA: Diagnosis not present

## 2023-10-01 NOTE — Progress Notes (Signed)
Occupational Therapy Treatment Patient Details Name: Teresa Wiley MRN: [id number] DOB: 04/22/1956 Today's Date: 10/01/2023   History of present illness Patient is a 66 y/o female admitted 09/23/23 due to fall when she reached down and tripped on oxygen tubing.  Found to have R tibia fx and proximal fibular maisonneuve fx.  She underwent IM nail of tibia fx on same day.  PMH includes chronic hypercapnic respiratory failure due to pulmonary hypertension on chronic home O2, bronchomalacia, ASD s/p percutaneous closure 10/23, PE 2017, HTN, DM II and chronic pain syndrome.   OT comments  Pt in bed upon therapy arrival. Initial apprehensive to participate in therapy session with reports of lack of sleep and fatigue. Pt did agree with session focusing on DME education and discharge planning. DME delivered to room today including manual WC and drop arm BSC. No WC cushion delivered and TOC was informed as well as recommendation for SB as d/c plan is home versus SNF. Unable to contact pt's Son to be present for therapy session with several attempts made to contact him. At end of session, pt was able to call son and therapy scheduled session tomorrow at 10AM for family training. Max VC for safety awareness and technique provided while education and visual demonstration provided on management of WC parts. Pt declined to not use SB for transfer although it was recommended. Crutches were discussed and pt was educated on the increased fall risk and level of difficulty with crutch use. Pt verbalized preference of not wanting to use drop arm BSC and OT discussed technique for toilet transfer without use with recommendation to practice at tomorrow's session. Next session: family training with Son. Review transfer technique with SB, discuss WB restrictions, complete toilet transfer with or without BSC.       If plan is discharge home, recommend the following:  A lot of help with walking and/or transfers;A lot of help  with bathing/dressing/bathroom;Assistance with cooking/housework;Assistance with feeding;Assist for transportation;Help with stairs or ramp for entrance   Equipment Recommendations  BSC/3in1;Wheelchair (measurements OT);Wheelchair cushion (measurements OT);Other (comment) (slideboard)       Precautions / Restrictions Precautions Precautions: Fall Required Braces or Orthoses: Splint/Cast Splint/Cast: R lower leg Restrictions Weight Bearing Restrictions: Yes RLE Weight Bearing: Non weight bearing       Mobility Bed Mobility Overal bed mobility: Needs Assistance Bed Mobility: Supine to Sit, Sit to Supine     Supine to sit: HOB elevated, Used rails, Contact guard     General bed mobility comments: Assist provided for RLE management on/off bed during bed mobility. Pt required use of at least 1 bed rail when transitioning to left side of bed. HOB slightly elevated.    Transfers Overall transfer level: Needs assistance Equipment used: Sliding board Transfers: Bed to chair/wheelchair/BSC            Lateral/Scoot Transfers: Min assist General transfer comment: Pt requested to not use SB for transfer even though it was encouraged by therapy. Pt required assist for WC set-up and education on safe transfer technique while maintaining WB restrictions. Therapist stabilized WC to prevent moving during lateral scoot. Recommended using SB tomorrow during next therapy session.     Balance Overall balance assessment: Needs assistance Sitting-balance support: Bilateral upper extremity supported, Feet supported Sitting balance-Leahy Scale: Good Sitting balance - Comments: on EOB with both feet on floor       Standing balance comment: Stand not attempted this date. Focused on lateral scoot transfer as d/c plan was changed to  home with family assist.          ADL either performed or assessed with clinical judgement   ADL Overall ADL's : Needs assistance/impaired      Upper Body  Dressing : Minimal assistance;Sitting         Toilet Transfer Details (indicate cue type and reason): Verbal education provided on use of drop arm BSC for toilet transfer. Pt verbalized that due to the time she had to care for her mother and using a BSC, it brought up some emotions. Pt verbalized that she would like to not use the [facility] if possible. OT provided education on completing toilet transfer without BSC and recommended practicing technique at next session.                    Cognition Arousal: Alert Behavior During Therapy: Anxious Overall Cognitive Status: Impaired/Different from baseline Area of Impairment: Following commands, Problem solving, Attention          Current Attention Level: Sustained   Following Commands: Follows one step commands with increased time     Problem Solving: Requires verbal cues, Requires tactile cues, Difficulty sequencing General Comments: Pt was slightly impulsive during session requiring VC and education on precautions, DME management and features, and transfer technique. Pt verbalized at start of session that she has not been sleeping well and is tired.              General Comments 2L O2 [place] on during session.    Pertinent Vitals/ Pain       Pain Assessment Pain Assessment: Faces Faces Pain Scale: Hurts whole lot Pain Location: R lower leg with any movement/mobility and when LE is not elevated Pain Descriptors / Indicators: Grimacing, Guarding Pain Intervention(s): Limited activity within patient's tolerance, Monitored during session, RN gave pain meds during session, Repositioned         Frequency  Min 1X/week        Progress Toward Goals  OT Goals(current goals can now be found in the care plan section)  Progress towards OT goals: Progressing toward goals         Co-evaluation    PT/OT/SLP Co-Evaluation/Treatment: Yes Reason for Co-Treatment: To address functional/ADL transfers   OT goals addressed during  session: Proper use of Adaptive equipment and DME      AM-PAC OT "6 Clicks" Daily Activity     Outcome Measure   Help from another person eating meals?: None Help from another person taking care of personal grooming?: A Little Help from another person toileting, which includes using toliet, bedpan, or urinal?: Total (foley) Help from another person bathing (including washing, rinsing, drying)?: Total Help from another person to put on and taking off regular upper body clothing?: A Little Help from another person to put on and taking off regular lower body clothing?: Total 6 Click Score: 13    End of Session Equipment Utilized During Treatment: Oxygen;Other (comment) (WC)  OT Visit Diagnosis: Unsteadiness on feet (R26.81);Other abnormalities of gait and mobility (R26.89);Pain Pain - Right/Left: Right Pain - part of body: Leg   Activity Tolerance Patient tolerated treatment well   Patient Left in bed;with call bell/phone within reach;with bed alarm set           Time: 1610-9604 OT Time Calculation (min): 34 min  Charges: OT General Charges $OT Visit: 1 Visit OT Treatments $Self Care/Home Management : 8-22 mins  Limmie Patricia, OTR/L,CBIS  Supplemental OT - [facility] and [facility] Secure Chat Preferred  Maniah Nading, Charisse March 10/01/2023, 3:00 PM

## 2023-10-01 NOTE — Progress Notes (Signed)
[facility]  Now 8 days post op s/p IMN R tibia and stress eval R ankle (stress stable) Xrays from yesterday look excellent Exam stable  Chronic pain patient (Dr. Ellwood Sayers).  As she is now 8 days post op I would not plan to write any additional meds for her at dc.  She really needs to start weaning back to her baseline regimen. She just received a 30 day rx from her provider on 09/18/2023 and was admitted to the hospital on 10/29  Follow up with ortho in 7-10 days for splint removal  Continue NWB R leg  Mearl Latin, PA-C [phone] (C) 10/01/2023, 9:56 AM  [facility] [address] [place] [address] [phone] (F)         Patient ID: Teresa Wiley, female   DOB: June 06, 1956, 68 y.o.   MRN: [id number]

## 2023-10-01 NOTE — Care Management Important Message (Signed)
Important Message  Patient Details  Name: Teresa Wiley MRN: [id number] Date of Birth: [birth year]   Important Message Given:  Yes - Medicare IM     Dorena Bodo 10/01/2023, 3:08 PM

## 2023-10-01 NOTE — TOC Progression Note (Signed)
Transition of Care (TOC) - Progression Note  Donn Pierini RN, BSN Transitions of Care Unit 4E- RN Case Manager See Treatment Team for direct phone # Cross coverage for 6N  Patient Details  Name: Blanka Rockholt MRN: [id number] Date of Birth: 1956/07/19  Transition of Care [facility]) CM/SW Contact  Zenda Alpers Lenn Sink, RN Phone Number: 10/01/2023, 3:05 PM  Clinical Narrative:    Notified by PT that pt also need slide board for home- recommendation for 30 in board.   Order placed for 30 in slide board and contacted Mitch w/ [employer] to add DME to referral previous CM made for home DME. Mitch to add slide board to other DME that is being arranged for home.    Expected Discharge Plan:  (see note) Barriers to Discharge: Continued Medical Work up  Expected Discharge Plan and Services   Discharge Planning Services: CM Consult Post Acute Care Choice: Home Health Living arrangements for the past 2 months: Single Family Home                 DME Arranged:  (see note)         HH Arranged:  (see note)           Social Determinants of Health (SDOH) Interventions SDOH Screenings   Food Insecurity: No Food Insecurity (09/23/2023)  Housing: Low Risk  (09/24/2023)  Recent Concern: Housing - Medium Risk (09/23/2023)  Transportation Needs: No Transportation Needs (09/23/2023)  Utilities: Not At Risk (09/23/2023)  Social Connections: Unknown (06/26/2022)   Received from [facility]  Tobacco Use: Medium Risk (09/23/2023)    Readmission Risk Interventions     No data to display

## 2023-10-01 NOTE — Progress Notes (Signed)
Physical Therapy Treatment Patient Details Name: Teresa Wiley MRN: [id number] DOB: 1956/02/02 Today's Date: 10/01/2023   History of Present Illness Patient is a 67 y/o female admitted 09/23/23 due to fall when she reached down and tripped on oxygen tubing.  Found to have R tibia fx and proximal fibular maisonneuve fx.  She underwent IM nail of tibia fx on same day.  PMH includes chronic hypercapnic respiratory failure due to pulmonary hypertension on chronic home O2, bronchomalacia, ASD s/p percutaneous closure 10/23, PE 2017, HTN, DM II and chronic pain syndrome.    PT Comments  Pt in bed upon arrival and agreeable to PT session. Pt is now discharging home with son. Worked on transferring to/from bed to [facility] while adhering to [employer] precautions. Pt wanted to attempt lateral scoot with no slide board. Required MinA to hold WC stable as the WC tended to rock while the pt scooted. Pt had increased difficulty transferring from the lower WC to the bed and agreed to use the slide board in the next PT session. Pt requires cues and assist for [facility] management at this time. Scheduled to meet with family to educate on safe mobility for return home. Pt is progressing towards goals. Acute PT to follow.      If plan is discharge home, recommend the following: A little help with walking and/or transfers;Assistance with cooking/housework;Assist for transportation;Help with stairs or ramp for entrance;A little help with bathing/dressing/bathroom   Can travel by private vehicle     No  Equipment Recommendations  Wheelchair (measurements PT);Wheelchair cushion (measurements PT);BSC/3in1;Other (comment) (slide board)    Recommendations for Other Services       Precautions / Restrictions Precautions Precautions: Fall Required Braces or Orthoses: Splint/Cast Splint/Cast: R lower leg Restrictions Weight Bearing Restrictions: Yes RLE Weight Bearing: Non weight bearing     Mobility  Bed Mobility Overal bed  mobility: Needs Assistance Bed Mobility: Supine to Sit, Sit to Supine     Supine to sit: HOB elevated, Used rails, Contact guard Sit to supine: Contact guard assist, HOB elevated   General bed mobility comments: Pt required assist to lift R LE off and onto pillows due to pain. Pt required 1 bed rail to shift trunk towards EOB and to raise trunk. HOB slightly elevated.    Transfers Overall transfer level: Needs assistance Equipment used: None Transfers: Bed to chair/wheelchair/BSC            Lateral/Scoot Transfers: Min assist General transfer comment: Pt wanted to attempt lateral scoot to [facility] with no slide board despite encouragement. Assist needed for WC set up. Pt required assist to hold WC steady while scooting. Increased difficulty from low to high surface. Pt agreed it would be easier to use SB next visit      Merchant navy officer mobility: Yes Wheelchair propulsion: Both upper extremities Wheelchair parts: Needs assistance Distance: 6 Wheelchair Assistance Details (indicate cue type and reason): pt able to lock WC breaks with cues. Required assist for leg rest and arm rest. Pt able to perform 180 deg turn.          Balance Overall balance assessment: Needs assistance Sitting-balance support: Bilateral upper extremity supported, Feet supported Sitting balance-Leahy Scale: Good         Standing balance comment: did not stand today to focus on safe transition home with lateral scoot transfer     Cognition Arousal: Alert Behavior During Therapy: Anxious Overall Cognitive Status: Impaired/Different from baseline Area of Impairment: Following commands, Problem  solving    Following Commands: Follows one step commands with increased time     Problem Solving: Requires verbal cues, Requires tactile cues, Difficulty sequencing General Comments: Pt required cues to slow down and have proper set up of WC prior to moving. Pt occasionally  impulsive with trying to move quickly. Cues to adhere to WB precautions           General Comments General comments (skin integrity, edema, etc.): On 2L [place] with VSS      Pertinent Vitals/Pain Pain Assessment Pain Assessment: Faces Faces Pain Scale: Hurts whole lot Pain Location: R LE with movement Pain Descriptors / Indicators: Grimacing, Guarding Pain Intervention(s): Limited activity within patient's tolerance, Monitored during session, Repositioned     PT Goals (current goals can now be found in the care plan section) Acute Rehab PT Goals PT Goal Formulation: With patient Time For Goal Achievement: 10/08/23 Potential to Achieve Goals: Good Progress towards PT goals: Progressing toward goals    Frequency    Min 1X/week           Co-evaluation PT/OT/SLP Co-Evaluation/Treatment: Yes Reason for Co-Treatment: To address functional/ADL transfers PT goals addressed during session: Mobility/safety with mobility;Balance OT goals addressed during session: Proper use of Adaptive equipment and DME      AM-PAC PT "6 Clicks" Mobility   Outcome Measure  Help needed turning from your back to your side while in a flat bed without using bedrails?: A Little Help needed moving from lying on your back to sitting on the side of a flat bed without using bedrails?: A Little Help needed moving to and from a bed to a chair (including a wheelchair)?: A Little Help needed standing up from a chair using your arms (e.g., wheelchair or bedside chair)?: Total Help needed to walk in hospital room?: Total Help needed climbing 3-5 steps with a railing? : Total 6 Click Score: 12    End of Session Equipment Utilized During Treatment: Oxygen;Gait belt Activity Tolerance: Patient tolerated treatment well Patient left: in bed;with call bell/phone within reach Nurse Communication: Mobility status PT Visit Diagnosis: Other abnormalities of gait and mobility (R26.89);Muscle weakness (generalized)  (M62.81);Pain Pain - Right/Left: Right Pain - part of body: Knee;Leg     Time: 2025-4270 PT Time Calculation (min) (ACUTE ONLY): 48 min  Charges:    $Therapeutic Activity: 8-22 mins PT General Charges $$ ACUTE PT VISIT: 1 Visit                     Hilton Cork, PT, DPT Secure Chat Preferred  Rehab Office [phone]    Arturo Morton Brion Aliment 10/01/2023, 4:32 PM

## 2023-10-01 NOTE — Progress Notes (Signed)
PROGRESS NOTE    Teresa Wiley  WUJ:[id number] DOB: [birth year] DOA: 09/23/2023 PCP: Maye Hides, PA    Brief Narrative:   Teresa Wiley is a 67 y.o. female with past medical history significant for chronic hypoxic hypercapnic respiratory failure on 2 L nasal cannula at baseline, HTN, pulmonary hypertension, bronchomalacia, history of PE, DM2, chronic pain syndrome, ASD s/p percutaneous closure 08/2022 who presented to [facility] ED on 10/29 with right lower extremity pain following mechanical fall.  Workup in the ED was notable for right tibia/fibular fracture.  Orthopedics was consulted and patient underwent ORIF with IMN by Dr. Carola Frost on 10/29.  Seen by PT and OT with recommendation of SNF placement.  But patient and family decline with plan for discharge home with home health services.  Anticipate discharge home tomorrow on 10/02/2023.  Assessment & Plan:   Acute right tibia/fibular fracture secondary to mechanical fall Patient presented to ED with right lower extremity pain, imaging notable for right fibular/tibial fracture.  Orthopedics was consulted and patient underwent ORIF with IMN by Dr. Carola Frost on 09/23/2023. -- Nonweightbearing RLE, continue splint x 2 weeks with anticipated transition to CAM boot thereafter -- Oxycodone 15 mg p.o. every 4 hours as needed moderate pain -- Oxycodone 20 mg p.o. every 4 hours as needed severe pain -- PT/OT recommend SNF placement, family/patient decline, anticipate discharge home with home health 11/7 -- Follow-up orthopedics outpatient in 7-10 days for suture removal  Urinary retention, acute Catheter placed in the perioperative setting, attempted removal 09/26/2023, unsuccessful and replaced. --Continue Foley catheter, will continue on discharge until mobility improves with recommendation of voiding trial outpatient  Elevated vitamin D level Discontinue vitamin D supplementation at discharge.  ASD s/p closure 08/2022 Pleated 6 months of DAPT  with aspirin/Plavix.  History of pulmonary embolism No longer on anticoagulation  Chronic respiratory failure with hypoxia/hypercapnia Pulmonary hypertension -- Sildenafil 20 mg p.o. 3 times daily -- Continue supplemental oxygen, maintain SpO2 greater than 88%  Essential hypertension -- Carvedilol 6.25 mg p.o. twice daily -- Furosemide 40 mg p.o. daily  Chronic pain syndrome Home regimen includes Percocet 10-325 mg p.o. 4 times daily.  Type 2 diabetes mellitus Hemoglobin A1c 5.7, well-controlled.  On Ozempic and diet control at baseline.  Obesity Body mass index is 35.3 kg/m.  Complicates all facets of care   DVT prophylaxis: apixaban (ELIQUIS) tablet 2.5 mg Start: 09/24/23 1000 SCDs Start: 09/23/23 2001 apixaban (ELIQUIS) tablet 2.5 mg    Code Status: Full Code Family Communication:   Disposition Plan:  Level of care: Med-Surg Status is: Inpatient Remains inpatient appropriate because: PT/OT recommend SNF, family/patient has declined, anticipate discharge home tomorrow with home health services    Consultants:  Orthopedics, Dr. Carola Frost  Procedures:  ORIF/IMN right tibia, Dr. Carola Frost 10/29  Antimicrobials:  Cefazolin 10/29 - 10/30   Subjective: Patient is examined bedside, resting calmly.  Lying in bed.  Continues with pain to right lower extremity, slowly improving.  Has declined SNF placement and pending discharge to home with home health services likely tomorrow.  No other specific questions or concerns at this time.  Denies headache, no visual changes, no chest pain, no palpitations, no shortness of breath greater than typical baseline, no abdominal pain, no fever/chills/night sweats, no nausea cefonicid diarrhea, no focal weakness, no fatigue, no paresthesia.  No acute events overnight per nurse staff.  Objective: Vitals:   09/30/23 2358 10/01/23 0341 10/01/23 0342 10/01/23 0849  BP: 98/60 (!) 107/47  124/78  Pulse: 99 92  85  Resp:    18  Temp: 98 F (36.7  C) 98.2 F (36.8 C)  98.2 F (36.8 C)  TempSrc: Oral Oral    SpO2: 100% 100%  100%  Weight:   111.6 kg   Height:        Intake/Output Summary (Last 24 hours) at 10/01/2023 1653 Last data filed at 10/01/2023 0900 Gross per 24 hour  Intake 1030 ml  Output 825 ml  Net 205 ml   Filed Weights   09/29/23 0442 09/30/23 0523 10/01/23 0342  Weight: 104.7 kg 106.7 kg 111.6 kg    Examination:  Physical Exam: GEN: NAD, alert and oriented x 3, obese HEENT: NCAT, PERRL, EOMI, sclera clear, MMM PULM: CTAB w/o wheezes/crackles, normal respiratory effort, on 2 L nasal With SpO2 100% at rest CV: RRR w/o M/G/R GI: abd soft, NTND, NABS, no R/G/M MSK: no peripheral edema, moves all extremity dependently, strength preserved, right lower extremity with surgical dressing/splints with Ace wrap in place, clean/dry/intact, neurovascular intact NEURO: CN II-XII intact, no focal deficits, sensation to light touch intact PSYCH: normal mood/affect Integumentary: Right lower extremity dressing/splint in place as above, otherwise no other concerning rashes/lesions/wounds noted on exposed skin surfaces.    Data Reviewed: I have personally reviewed following labs and imaging studies  CBC: Recent Labs  Lab 09/25/23 0251 09/26/23 0248 09/30/23 0245  WBC 5.6 6.4 6.7  NEUTROABS 3.1  --   --   HGB 9.0* 9.5* 9.5*  HCT 30.0* 32.7* 32.1*  MCV 84.7 87.2 87.7  PLT 253 251 299   Basic Metabolic Panel: Recent Labs  Lab 09/25/23 0251 09/26/23 0248 09/30/23 0245  NA 138 141 138  K 3.5 3.8 3.5  CL 102 101 98  CO2 30 31 31   GLUCOSE 94 107* 127*  BUN 6* 8 15  CREATININE 0.83 0.79 1.16*  CALCIUM 8.7* 8.9 8.6*  MG 1.9 1.9  --   PHOS 3.6 3.8  --    GFR: Estimated Creatinine Clearance: 64.5 mL/min (A) (by C-G formula based on SCr of 1.16 mg/dL (H)). Liver Function Tests: Recent Labs  Lab 09/25/23 0251  AST 13*  ALT 8  ALKPHOS 72  BILITOT 0.7  PROT 5.8*  ALBUMIN 2.7*   No results for  input(s): "LIPASE", "AMYLASE" in the last 168 hours. No results for input(s): "AMMONIA" in the last 168 hours. Coagulation Profile: No results for input(s): "INR", "PROTIME" in the last 168 hours. Cardiac Enzymes: No results for input(s): "CKTOTAL", "CKMB", "CKMBINDEX", "TROPONINI" in the last 168 hours. BNP (last 3 results) No results for input(s): "PROBNP" in the last 8760 hours. HbA1C: No results for input(s): "HGBA1C" in the last 72 hours. CBG: No results for input(s): "GLUCAP" in the last 168 hours. Lipid Profile: No results for input(s): "CHOL", "HDL", "LDLCALC", "TRIG", "CHOLHDL", "LDLDIRECT" in the last 72 hours. Thyroid Function Tests: No results for input(s): "TSH", "T4TOTAL", "FREET4", "T3FREE", "THYROIDAB" in the last 72 hours. Anemia Panel: No results for input(s): "VITAMINB12", "FOLATE", "FERRITIN", "TIBC", "IRON", "RETICCTPCT" in the last 72 hours. Sepsis Labs: No results for input(s): "PROCALCITON", "LATICACIDVEN" in the last 168 hours.  Recent Results (from the past 240 hour(s))  Surgical pcr screen     Status: None   Collection Time: 09/23/23  2:41 PM   Specimen: Nasal Mucosa; Nasal Swab  Result Value Ref Range Status   MRSA, PCR NEGATIVE NEGATIVE Final   Staphylococcus aureus NEGATIVE NEGATIVE Final    Comment: (NOTE) The Xpert SA Assay (FDA approved for  NASAL specimens in patients 26 years of age and older), is one component of a comprehensive surveillance program. It is not intended to diagnose infection nor to guide or monitor treatment. Performed at [facility], [address], [place], [place] [address]          Radiology Studies: DG Tibia/Fibula Right Port  Result Date: 09/30/2023 CLINICAL DATA:  [id number] Fracture [id number] EXAM: PORTABLE RIGHT TIBIA AND FIBULA - 2 VIEW COMPARISON:  09/23/2023 FINDINGS: Stable tibial IM rod with 2 proximal and 2 distal interlocking screws, transfixing distal shaft oblique fracture in near anatomic alignment.  There is a minimally comminuted proximal fibular shaft fracture, minimally displaced. Ankle mortise appears intact. Cast material projects over the lower leg. IMPRESSION: 1. Stable tibial IM rod transfixing distal shaft fracture in near anatomic alignment. 2. Minimally displaced proximal fibular shaft fracture. Electronically Signed   By: Corlis Leak M.D.   On: 09/30/2023 16:41        Scheduled Meds:  ALPRAZolam  1 mg Oral TID   apixaban  2.5 mg Oral BID   carvedilol  6.25 mg Oral BID   Chlorhexidine Gluconate Cloth  6 each Topical Daily   docusate sodium  100 mg Oral BID   ferrous sulfate  325 mg Oral QODAY   folic acid  1 mg Oral Daily   furosemide  40 mg Oral q AM   gabapentin  600 mg Oral TID   ketorolac  10 mg Oral Q8H   methocarbamol  500 mg Oral TID   pantoprazole  40 mg Oral BID   polyethylene glycol  17 g Oral BID   sildenafil  20 mg Oral TID   sodium chloride flush  3 mL Intravenous Q12H   Continuous Infusions:   LOS: 8 days    Time spent: 50 minutes spent on chart review, discussion with nursing staff, consultants, updating family and interview/physical exam; more than 50% of that time was spent in counseling and/or coordination of care.    Alvira Philips [place], DO [facility] Available via Epic secure chat 7am-7pm After these hours, please refer to coverage provider listed on [email or web address] 10/01/2023, 4:53 PM

## 2023-10-01 NOTE — TOC Progression Note (Addendum)
Transition of Care (TOC) - Progression Note   NCM and SW spoke to patient at bedside.   Patient has decided to go home at discharge with home health. She has 4 children.   Sons are going to assist her.   She asked again about her son being a paid caregiver through [place].  NCM faxed forms in last week but it takes time to get approved. Will not be approved before before her NWB is over. Kalif [phone] . [facility] and this was explained. He voiced understanding.   Patient will have HHRN,PT,OT,SW and aide. Patient and son aware home health will not be in the room daily. Patient states if she gets home and decides to go to a SNF she will at that time. Explained she will have a HHSW however it takes longer to go to a SNF from home than from hospital. Patient voiced understanding   Tresa Endo with [facility] accepted referral   Patient has a Rollator at home already.   PT recommending wheelchair and drop arm bedside commode.   Patient plans to go home via car. Will have DME delivered to room. Called Mitch with [employer]. Patient has home oxygen with [employer] and will need portable concentrator to use for drive home. Marthann Schiller will have same delivered to room.    NCM messaged PT/OT to see if they can call Kalif a schedule a time to meet with him for patient's next session. [facility] [phone]   Patient Details  Name: Teresa Wiley MRN: [id number] Date of Birth: 1956/03/09  Transition of Care [facility]) CM/SW Contact  Remer Couse, Adria Devon, RN Phone Number: 10/01/2023, 11:46 AM  Clinical Narrative:       Expected Discharge Plan:  (see note) Barriers to Discharge: Continued Medical Work up  Expected Discharge Plan and Services   Discharge Planning Services: CM Consult Post Acute Care Choice: Home Health Living arrangements for the past 2 months: Single Family Home                 DME Arranged:  (see note)         HH Arranged:  (see note)           Social Determinants of  Health (SDOH) Interventions SDOH Screenings   Food Insecurity: No Food Insecurity (09/23/2023)  Housing: Low Risk  (09/24/2023)  Recent Concern: Housing - Medium Risk (09/23/2023)  Transportation Needs: No Transportation Needs (09/23/2023)  Utilities: Not At Risk (09/23/2023)  Social Connections: Unknown (06/26/2022)   Received from [facility]  Tobacco Use: Medium Risk (09/23/2023)    Readmission Risk Interventions     No data to display

## 2023-10-01 NOTE — TOC CM/SW Note (Cosign Needed)
    Durable Medical Equipment  (From admission, onward)           Start     Ordered   10/01/23 1026  For home use only DME standard manual wheelchair with seat cushion  Once       Comments: Patient suffers from  IMN R tibia and stress eval R ankle  which impairs their ability to perform daily activities like ambulating  in the home.  A cane  will not resolve issue with performing activities of daily living. A wheelchair will allow patient to safely perform daily activities. Patient can safely propel the wheelchair in the home or has a caregiver who can provide assistance. Length of need lifetime . Accessories: elevating leg rests (ELRs), wheel locks, extensions and anti-tippers.  Seat and back cushions   10/01/23 1026   10/01/23 1025  For home use only DME 3 n 1  Once       Comments: Drop arm bedside commode   10/01/23 1026             Patient needs drop arm bedside commode , patient is confined to one room and can't get to the bathroom

## 2023-10-02 DIAGNOSIS — S82301A Unspecified fracture of lower end of right tibia, initial encounter for closed fracture: Secondary | ICD-10-CM | POA: Diagnosis not present

## 2023-10-02 DIAGNOSIS — S82241A Displaced spiral fracture of shaft of right tibia, initial encounter for closed fracture: Secondary | ICD-10-CM | POA: Diagnosis not present

## 2023-10-02 DIAGNOSIS — M25561 Pain in right knee: Secondary | ICD-10-CM | POA: Diagnosis not present

## 2023-10-02 MED ORDER — DOCUSATE SODIUM 100 MG PO CAPS: 100.0000 mg | ORAL_CAPSULE | Freq: Two times a day (BID) | ORAL | 0 refills | Status: AC

## 2023-10-02 MED ORDER — METHOCARBAMOL 500 MG PO TABS: 500.0000 mg | ORAL_TABLET | Freq: Four times a day (QID) | ORAL | 0 refills | Status: DC | PRN

## 2023-10-02 MED ORDER — OXYCODONE-ACETAMINOPHEN 10-325 MG PO TABS: 1.0000 | ORAL_TABLET | Freq: Four times a day (QID) | ORAL | 0 refills | Status: AC

## 2023-10-02 MED ORDER — HYDROMORPHONE HCL 1 MG/ML IJ SOLN
0.5000 mg | Freq: Once | INTRAMUSCULAR | Status: AC
  Administered 2023-10-02: 0.5 mg via INTRAVENOUS
  Filled 2023-10-02: qty 0.5

## 2023-10-02 MED ORDER — APIXABAN 2.5 MG PO TABS: 2.5000 mg | ORAL_TABLET | Freq: Two times a day (BID) | ORAL | 0 refills | Status: DC

## 2023-10-02 MED ORDER — TIZANIDINE HCL 4 MG PO TABS: 4.0000 mg | ORAL_TABLET | Freq: Four times a day (QID) | ORAL | 0 refills | Status: DC | PRN

## 2023-10-02 NOTE — Progress Notes (Signed)
Physical Therapy Treatment Patient Details Name: Teresa Wiley MRN: [id number] DOB: 1955-12-23 Today's Date: 10/02/2023   History of Present Illness Patient is a 67 y/o female admitted 09/23/23 due to fall when she reached down and tripped on oxygen tubing.  Found to have R tibia fx and proximal fibular maisonneuve fx.  She underwent IM nail of tibia fx on same day.  PMH includes chronic hypercapnic respiratory failure due to pulmonary hypertension on chronic home O2, bronchomalacia, ASD s/p percutaneous closure 10/23, PE 2017, HTN, DM II and chronic pain syndrome.    PT Comments  Co-treated with OT to provide pt and son with family training on safety with mobility. Pt and son were educated on proper WC management, slide board transfers, bed mobility, and ascending/descending steps with a WC. Son was able to demonstrate safety with transfers, WC mobility, and stair negotiation. Demonstrated ascending/descending 3 steps with WC and two person assist. Family was provided with education handouts on these areas as well. Also discussed safely performing car transfers with pt and son. Pt and son with no further questions or concerns regarding mobility. Acute PT to follow.     If plan is discharge home, recommend the following: A little help with walking and/or transfers;Assistance with cooking/housework;Assist for transportation;Help with stairs or ramp for entrance;A little help with bathing/dressing/bathroom   Can travel by private vehicle     Yes  Equipment Recommendations  Wheelchair (measurements PT);Wheelchair cushion (measurements PT);BSC/3in1;Other (comment)       Precautions / Restrictions Precautions Precautions: Fall Required Braces or Orthoses: Splint/Cast Splint/Cast: R lower leg Restrictions Weight Bearing Restrictions: Yes RLE Weight Bearing: Non weight bearing     Mobility  Bed Mobility Overal bed mobility: Needs Assistance Bed Mobility: Supine to Sit, Sit to Supine      Supine to sit: Contact guard, Used rails Sit to supine: Contact guard assist, Used rails   General bed mobility comments: Increased time and assist with raising R LE off pillow. Son assisted    Transfers Overall transfer level: Needs assistance Equipment used: Sliding board Transfers: Bed to chair/wheelchair/BSC     Lateral/Scoot Transfers: Min assist, With slide board General transfer comment: MinA to stabilize WC and slide board. Son educated on proper slibe board transfer and blocking WC with good follow through. Pt and family educated on [facility] management with proprer demonstration       Stairs Stairs: Yes Stairs assistance: Total assist, +2 physical assistance, +2 safety/equipment Stair Management: Wheelchair Number of Stairs: 3 General stair comments: bump up steps with two person assist, one posterior and one person anterior. Practiced with therapist in [facility] to demo w/ the son        Balance Overall balance assessment: Needs assistance Sitting-balance support: Bilateral upper extremity supported, Feet supported Sitting balance-Leahy Scale: Good         Standing balance comment: pt working on safe transition home w/ WC and slide board. Will work on standing with HH         Cognition Arousal: Alert Behavior During Therapy: Anxious, Lability Overall Cognitive Status: Impaired/Different from baseline Area of Impairment: Following commands, Problem solving    Current Attention Level: Sustained   Following Commands: Follows one step commands consistently     Problem Solving: Requires verbal cues, Difficulty sequencing General Comments: VS to slow down movements and wait for son to assist. Educated son and pt on safety with WC and WB precautions           General Comments General  comments (skin integrity, edema, etc.): 2L [place] w/ VSS. Anxiety through the session, however, pt was able to self regulate with support      Pertinent Vitals/Pain Pain  Assessment Pain Assessment: Faces Faces Pain Scale: Hurts whole lot Pain Location: R LE with movement Pain Descriptors / Indicators: Grimacing, Guarding Pain Intervention(s): Limited activity within patient's tolerance, Monitored during session, Repositioned, RN gave pain meds during session    PT Goals (current goals can now be found in the care plan section) Acute Rehab PT Goals PT Goal Formulation: With patient Time For Goal Achievement: 10/08/23 Potential to Achieve Goals: Good Progress towards PT goals: Progressing toward goals    Frequency    Min 1X/week           Co-evaluation   Reason for Co-Treatment: To address functional/ADL transfers;Other (comment) (family education) PT goals addressed during session: Mobility/safety with mobility;Balance;Proper use of DME OT goals addressed during session: ADL's and self-care;Proper use of [employer] equipment and DME      AM-PAC PT "6 Clicks" Mobility   Outcome Measure  Help needed turning from your back to your side while in a flat bed without using bedrails?: A Little Help needed moving from lying on your back to sitting on the side of a flat bed without using bedrails?: A Little Help needed moving to and from a bed to a chair (including a wheelchair)?: A Little Help needed standing up from a chair using your arms (e.g., wheelchair or bedside chair)?: Total Help needed to walk in hospital room?: Total Help needed climbing 3-5 steps with a railing? : Total 6 Click Score: 12    End of Session Equipment Utilized During Treatment: Oxygen;Gait belt Activity Tolerance: Patient tolerated treatment well Patient left: in bed;with call bell/phone within reach;with family/visitor present Nurse Communication: Mobility status PT Visit Diagnosis: Other abnormalities of gait and mobility (R26.89);Muscle weakness (generalized) (M62.81);Pain Pain - Right/Left: Right Pain - part of body: Knee;Leg     Time: 1002-1102 PT Time  Calculation (min) (ACUTE ONLY): 60 min  Charges:    $Therapeutic Activity: 8-22 mins $Self Care/Home Management: 8-22 PT General Charges $$ ACUTE PT VISIT: 1 Visit                     Hilton Cork, PT, DPT Secure Chat Preferred  Rehab Office [phone]    Arturo Morton Brion [employer] 10/02/2023, 12:21 PM

## 2023-10-02 NOTE — TOC Progression Note (Addendum)
Transition of Care (TOC) - Progression Note   Called [employer] again for wheel chair cushion . Marthann Schiller will have wheelchair cushion delivered to room ASAP  Patient Details  Name: [name] MRN: [id number] Date of Birth: [birth year]  Transition of Care [facility]) CM/SW Contact  Woodfin Kiss, Adria Devon, RN Phone Number: 10/02/2023, 11:28 AM  Clinical Narrative:       Expected Discharge Plan:  (see note) Barriers to Discharge: Continued Medical Work up  Expected Discharge Plan and Services   Discharge Planning Services: CM Consult Post Acute Care Choice: Home Health Living arrangements for the past 2 months: Single Family Home Expected Discharge Date: 10/02/23               DME Arranged:  (see note)         HH Arranged:  (see note)           Social Determinants of Health (SDOH) Interventions SDOH Screenings   Food Insecurity: No Food Insecurity (09/23/2023)  Housing: Low Risk  (09/24/2023)  Recent Concern: Housing - Medium Risk (09/23/2023)  Transportation Needs: No Transportation Needs (09/23/2023)  Utilities: Not At Risk (09/23/2023)  Social Connections: Unknown (06/26/2022)   [facility] from [facility]  Tobacco Use: Medium Risk (09/23/2023)    Readmission Risk Interventions     No data to display

## 2023-10-02 NOTE — Discharge Summary (Signed)
Physician Discharge Summary  Teresa Wiley WUJ:[id number] DOB: 07/10/1956 DOA: 09/23/2023  PCP: Maye Hides, PA  Admit date: 09/23/2023 Discharge date: 10/02/2023  Admitted From: Home Disposition: Home (patient refused SNF placement)  Recommendations for Outpatient Follow-up:  Follow up with PCP in 1-2 weeks Follow-up with orthopedics, Dr. Carola Frost 2 weeks Continue nonweightbearing right lower extremity with splint in place until follow-up with orthopedics Will need voiding trial in 1-2 weeks when more mobile  Home Health: PT/OT/RN/aide/social work Equipment/Devices: 3 and 1 bedside commode, wheelchair, slide board, Foley catheter in place  Discharge Condition: Stable CODE STATUS: Full code Diet recommendation: Heart healthy/consistent carbohydrate diet  History of present illness:  Teresa Wiley is a 67 y.o. female with past medical history significant for chronic hypoxic hypercapnic respiratory failure on 2 L nasal cannula at baseline, HTN, pulmonary hypertension, bronchomalacia, history of PE, DM2, chronic pain syndrome, ASD s/p percutaneous closure 08/2022 who presented to [facility] ED on 10/29 with right lower extremity pain following mechanical fall.  Workup in the ED was notable for right tibia/fibular fracture.  Orthopedics was consulted and patient underwent ORIF with IMN by Dr. Carola Frost on 10/29.  Seen by PT and OT with recommendation of SNF placement.  But patient and family decline with plan for discharge home with home health services.   Hospital course:  Acute right tibia/fibular fracture secondary to mechanical fall Patient presented to ED with right lower extremity pain, imaging notable for right fibular/tibial fracture.  Orthopedics was consulted and patient underwent ORIF with IMN by Dr. Carola Frost on 09/23/2023. Nonweightbearing RLE, continue splint x 2 weeks with anticipated transition to CAM boot thereafter.  Continue Percocet for pain control. PT/OT recommend SNF  placement, family/patient declined, discharging with home health services.  Outpatient follow-up with orthopedics.  Urinary retention, acute Catheter placed in the perioperative setting, attempted removal 09/26/2023, unsuccessful and replaced. Continue Foley catheter, will continue on discharge until mobility improves with recommendation of voiding trial outpatient   Elevated vitamin D level Discontinue vitamin D supplementation at discharge.   ASD s/p closure 08/2022 Completed 6 months of DAPT with aspirin/Plavix.   History of pulmonary embolism No longer on anticoagulation   Chronic respiratory failure with hypoxia/hypercapnia Pulmonary hypertension Sildenafil 20 mg p.o. 3 times daily. Continue supplemental oxygen, maintain SpO2 greater than 88%   Essential hypertension Continue carvedilol 6.25 mg p.o. twice daily, Furosemide 40 mg p.o. daily.  Losartan discontinued   Chronic pain syndrome Home regimen includes Percocet 10-325 mg p.o. 4 times daily.  Outpatient follow-up with pain management.   Type 2 diabetes mellitus Hemoglobin A1c 5.7, well-controlled.  On Ozempic and diet control at baseline.   Obesity Body mass index is 35.3 kg/m.  Complicates all facets of care  Discharge Diagnoses:  Principal Problem:   Fall Active Problems:   Closed right tibial fracture   Closed right fibular fracture   Essential hypertension   Chronic pain syndrome   Pulmonary hypertension (HCC)   Non-insulin dependent type 2 diabetes mellitus (HCC)   Pulmonary embolism (HCC)in 2017   Chronic hypoxic respiratory failure (HCC)   ASD (atrial septal defect) status post closure October 2023   Bronchomalacia    Discharge Instructions  Discharge Instructions     Call MD for:  difficulty breathing, headache or visual disturbances   Complete by: As directed    Call MD for:  extreme fatigue   Complete by: As directed    Call MD for:  persistant dizziness or light-headedness   Complete by: As  directed    Call MD for:  persistant nausea and vomiting   Complete by: As directed    Call MD for:  severe uncontrolled pain   Complete by: As directed    Call MD for:  temperature >100.4   Complete by: As directed    Diet - low sodium heart healthy   Complete by: As directed    Increase activity slowly   Complete by: As directed    Other Restrictions   Complete by: As directed    Maintain nonweightbearing right lower extremity until follows up with orthopedics for further direction/instructions      Allergies as of 10/02/2023       Reactions   Belbuca [buprenorphine Hcl] Hives   E.e.s. [erythromycin] Anaphylaxis, Nausea And Vomiting   Iodine Hives, Swelling, Other (See Comments)   Throat swelling   Shellfish Allergy Anaphylaxis, Hives   Pork-derived Products Other (See Comments)   Other Rash   Electrodes stickers        Medication List     STOP taking these medications    losartan 25 MG tablet Commonly known as: COZAAR   oxybutynin 5 MG tablet Commonly known as: DITROPAN   Vitamin D (Ergocalciferol) 1.25 MG (50000 UNIT) Caps capsule Commonly known as: DRISDOL       TAKE these medications    ALPRAZolam 1 MG tablet Commonly known as: XANAX Take 1 mg by mouth 3 (three) times daily.   amoxicillin 500 MG tablet Commonly known as: AMOXIL Take 4 tablets (2,000 mg total) by mouth as directed. 1 hour prior to dental work including cleanings   apixaban 2.5 MG Tabs tablet Commonly known as: ELIQUIS Take 1 tablet (2.5 mg total) by mouth 2 (two) times daily.   atorvastatin 20 MG tablet Commonly known as: LIPITOR Take 20 mg by mouth daily.   carvedilol 6.25 MG tablet Commonly known as: COREG TAKE 1 TABLET(6.25 MG) BY MOUTH TWICE DAILY WITH A MEAL   cyclobenzaprine 10 MG tablet Commonly known as: FLEXERIL Take 10 mg by mouth 3 (three) times daily as needed for muscle spasms.   diphenhydrAMINE 25 MG tablet Commonly known as: BENADRYL Take 100 mg by mouth  at bedtime as needed for sleep.   docusate sodium 100 MG capsule Commonly known as: COLACE Take 1 capsule (100 mg total) by mouth 2 (two) times daily.   furosemide 40 MG tablet Commonly known as: LASIX Take 40 mg by mouth daily.   gabapentin 600 MG tablet Commonly known as: NEURONTIN Take 600 mg by mouth 3 (three) times daily.   methocarbamol 500 MG tablet Commonly known as: ROBAXIN Take 1 tablet (500 mg total) by mouth every 6 (six) hours as needed for muscle spasms.   oxyCODONE-acetaminophen 10-325 MG tablet Commonly known as: PERCOCET Take 1 tablet by mouth 4 (four) times daily for 7 days.   OXYGEN Inhale 2 L into the lungs continuous.   Ozempic (2 MG/DOSE) 8 MG/3ML Sopn Generic drug: Semaglutide (2 MG/DOSE) Inject 2 mg into the skin every Wednesday.   pantoprazole 40 MG tablet Commonly known as: PROTONIX Take 40 mg by mouth 2 (two) times daily.   sildenafil 20 MG tablet Commonly known as: REVATIO Take 1 tablet (20 mg total) by mouth 3 (three) times daily.   Voltaren Arthritis Pain 1 % Gel Generic drug: diclofenac Sodium Apply 2 g topically in the morning and at bedtime.               Durable Medical Equipment  (  From admission, onward)           Start     Ordered   10/01/23 1504  For home use only DME Other see comment  Once       Comments: Slide Board- 30 in  Question:  Length of Need  Answer:  Lifetime   10/01/23 1504   10/01/23 1026  For home use only DME standard manual wheelchair with seat cushion  Once       Comments: Patient suffers from  IMN R tibia and stress eval R ankle  which impairs their ability to perform daily activities like ambulating  in the home.  A cane  will not resolve issue with performing activities of daily living. A wheelchair will allow patient to safely perform daily activities. Patient can safely propel the wheelchair in the home or has a caregiver who can provide assistance. Length of need lifetime . Accessories:  elevating leg rests (ELRs), wheel locks, extensions and anti-tippers.  Seat and back cushions   10/01/23 1026   10/01/23 1025  For home use only DME 3 n 1  Once       Comments: Drop arm bedside commode   10/01/23 1026            Follow-up Information     Maye Hides, PA. Schedule an appointment as soon as possible for a visit.   Specialty: Physician Assistant Contact information: [phone] [place] [address] [phone]         Myrene Galas, MD. Schedule an appointment as soon as possible for a visit in 2 week(s).   Specialty: Orthopedic Surgery Contact information: [address] [place] [address] [phone]                Allergies  Allergen Reactions   Belbuca [Buprenorphine Hcl] Hives   E.E.S. [Erythromycin] Anaphylaxis and Nausea And Vomiting   Iodine Hives, Swelling and Other (See Comments)    Throat swelling    Shellfish Allergy Anaphylaxis and Hives   Pork-Derived Products Other (See Comments)   Other Rash    Electrodes stickers    Consultations: Orthopedics, Dr. Carola Frost   Procedures/Studies: DG Tibia/Fibula Right Port  Result Date: 09/30/2023 CLINICAL DATA:  64403 Fracture [id number] EXAM: PORTABLE RIGHT TIBIA AND FIBULA - 2 VIEW COMPARISON:  09/23/2023 FINDINGS: Stable tibial IM rod with 2 proximal and 2 distal interlocking screws, transfixing distal shaft oblique fracture in near anatomic alignment. There is a minimally comminuted proximal fibular shaft fracture, minimally displaced. Ankle mortise appears intact. Cast material projects over the lower leg. IMPRESSION: 1. Stable tibial IM rod transfixing distal shaft fracture in near anatomic alignment. 2. Minimally displaced proximal fibular shaft fracture. Electronically Signed   By: Corlis Leak M.D.   On: 09/30/2023 16:41   DG CHEST PORT 1 VIEW  Result Date: 09/25/2023 CLINICAL DATA:  10026 Shortness of breath 10026 EXAM: PORTABLE CHEST 1 VIEW COMPARISON:  CXR 09/23/23  FINDINGS: Small right pleural effusion. No pneumothorax. Normal cardiac contours. There is persistent mediastinal widening hazy bibasilar airspace opacities could represent atelectasis or infection. No radiographically apparent displaced rib fracture. Visualized upper abdomen unremarkable. IMPRESSION: 1. Small right pleural effusion. 2. Hazy bibasilar airspace opacities could represent atelectasis or infection. Electronically Signed   By: Lorenza Cambridge M.D.   On: 09/25/2023 12:57   DG Tibia/Fibula Right Port  Result Date: 09/23/2023 CLINICAL DATA:  Postop. EXAM: PORTABLE RIGHT TIBIA AND FIBULA - 2 VIEW; RIGHT ANKLE - COMPLETE 3+  VIEW COMPARISON:  Preoperative imaging. FINDINGS: Tibia/fibula: Tibial intramedullary nail proximal and distal locking screw fixation traverse distal tibial fracture. Improved fracture alignment from preoperative imaging. Proximal fibular shaft fracture is also in unchanged alignment. Overlying splint material distally. Ankle: The ankle mortise is preserved. There may be a tiny chip fracture from the distal medial malleolus. Detailed assessment is limited by overlying soft tissue edema. IMPRESSION: 1. ORIF of distal tibial fracture with improved alignment from preoperative imaging. 2. Proximal fibular shaft fracture in unchanged alignment. 3. Possible tiny chip fracture from the distal medial malleolus. Electronically Signed   By: Narda Rutherford M.D.   On: 09/23/2023 21:46   DG Ankle Complete Right  Result Date: 09/23/2023 CLINICAL DATA:  Postop. EXAM: PORTABLE RIGHT TIBIA AND FIBULA - 2 VIEW; RIGHT ANKLE - COMPLETE 3+ VIEW COMPARISON:  Preoperative imaging. FINDINGS: Tibia/fibula: Tibial intramedullary nail proximal and distal locking screw fixation traverse distal tibial fracture. Improved fracture alignment from preoperative imaging. Proximal fibular shaft fracture is also in unchanged alignment. Overlying splint material distally. Ankle: The ankle mortise is preserved. There  may be a tiny chip fracture from the distal medial malleolus. Detailed assessment is limited by overlying soft tissue edema. IMPRESSION: 1. ORIF of distal tibial fracture with improved alignment from preoperative imaging. 2. Proximal fibular shaft fracture in unchanged alignment. 3. Possible tiny chip fracture from the distal medial malleolus. Electronically Signed   By: Narda Rutherford M.D.   On: 09/23/2023 21:46   DG Tibia/Fibula Right  Result Date: 09/23/2023 CLINICAL DATA:  Fracture. EXAM: RIGHT TIBIA AND FIBULA - 2 VIEW COMPARISON:  Preoperative imaging. FINDINGS: Fourteen fluoroscopic spot views of the right tibia and fibula obtained in the operating room. Imaging obtained during tibial intramedullary nail proximal and distal locking screw fixation traversing distal tibial fracture. Fluoroscopy time 1 minutes 7 seconds. Dose 3.02 mGy. IMPRESSION: Intraoperative fluoroscopy during tibial fracture fixation. Electronically Signed   By: Narda Rutherford M.D.   On: 09/23/2023 21:45   DG C-Arm 1-60 Min-No Report  Result Date: 09/23/2023 Fluoroscopy was utilized by the requesting physician.  No radiographic interpretation.   DG C-Arm 1-60 Min-No Report  Result Date: 09/23/2023 Fluoroscopy was utilized by the requesting physician.  No radiographic interpretation.   DG Knee Complete 4 Views Right  Result Date: 09/23/2023 CLINICAL DATA:  Fall EXAM: RIGHT KNEE - COMPLETE 4+ VIEW COMPARISON:  None Available. FINDINGS: Acute displaced proximal fibular shaft fracture. No dislocation at the knee. Moderate tricompartment arthritis. No significant effusion IMPRESSION: Acute displaced proximal fibular shaft fracture. Electronically Signed   By: Jasmine Pang M.D.   On: 09/23/2023 03:53   DG Tibia/Fibula Right  Result Date: 09/23/2023 CLINICAL DATA:  Fall with pain EXAM: RIGHT TIBIA AND FIBULA - 2 VIEW COMPARISON:  None Available. FINDINGS: Acute mildly comminuted fracture proximal shaft of fibula with  apex lateral angulation and slightly greater than 1/2 shaft diameter lateral and anterior displacement of distal fracture fragment. Additional displaced spiral fracture distal shaft of tibia IMPRESSION: 1. Acute displaced and slightly angulated proximal fibular shaft fracture 2. Acute displaced distal tibial fracture Electronically Signed   By: Jasmine Pang M.D.   On: 09/23/2023 03:52   DG Ankle 2 Views Right  Result Date: 09/23/2023 CLINICAL DATA:  Fall with ankle pain EXAM: RIGHT ANKLE - 2 VIEW COMPARISON:  None Available. FINDINGS: Acute spiral fracture distal shaft of the tibia with about 1/3 shaft diameter lateral and posterior displacement of distal fracture fragment. IMPRESSION: Acute displaced spiral fracture of distal  tibial shaft. Electronically Signed   By: Jasmine Pang M.D.   On: 09/23/2023 03:51   DG Hip Unilat W or Wo Pelvis 2-3 Views Right  Result Date: 09/23/2023 CLINICAL DATA:  Pain post fall EXAM: DG HIP (WITH OR WITHOUT PELVIS) 2-3V RIGHT COMPARISON:  None Available. FINDINGS: SI joints are non widened. Pubic symphysis and rami appear intact. No fracture or dislocation. Phleboliths in the pelvis IMPRESSION: No acute osseous abnormality. Electronically Signed   By: Jasmine Pang M.D.   On: 09/23/2023 03:50   DG Chest Portable 1 View  Result Date: 09/23/2023 CLINICAL DATA:  Fall EXAM: PORTABLE CHEST 1 VIEW COMPARISON:  08/23/2023, 12/12/2021, CT 06/24/2022 FINDINGS: Vascular occlusion device at the right cardiac silhouette. Stable mild pleural thickening versus small effusions. Enlarged cardiomediastinal silhouette. Patchy atelectasis and or scarring at the bases. Upper normal mediastinal silhouette but stable. No pneumothorax. IMPRESSION: Stable mild pleural thickening versus small effusions. Patchy atelectasis and or scarring at the bases. Enlarged cardiomediastinal silhouette. Electronically Signed   By: Jasmine Pang M.D.   On: 09/23/2023 03:49   CT Head Wo Contrast  Result  Date: 09/23/2023 CLINICAL DATA:  Fall EXAM: CT HEAD WITHOUT CONTRAST CT CERVICAL SPINE WITHOUT CONTRAST TECHNIQUE: Multidetector CT imaging of the head and cervical spine was performed following the standard protocol without intravenous contrast. Multiplanar CT image reconstructions of the cervical spine were also generated. RADIATION DOSE REDUCTION: This exam was performed according to the departmental dose-optimization program which includes automated exposure control, adjustment of the mA and/or kV according to patient size and/or use of iterative reconstruction technique. COMPARISON:  08/23/2023 FINDINGS: CT HEAD FINDINGS Brain: There is no mass, hemorrhage or extra-axial collection. The size and configuration of the ventricles and extra-axial CSF spaces are normal. The brain parenchyma is normal, without evidence of acute or chronic infarction. Vascular: No abnormal hyperdensity of the major intracranial arteries or dural venous sinuses. No intracranial atherosclerosis. Skull: The visualized skull base, calvarium and extracranial soft tissues are normal. Sinuses/Orbits: No fluid levels or advanced mucosal thickening of the visualized paranasal sinuses. No mastoid or middle ear effusion. The orbits are normal. CT CERVICAL SPINE FINDINGS Alignment: No static subluxation. Facets are aligned. Occipital condyles are normally positioned. Skull base and vertebrae: No acute fracture. Soft tissues and spinal canal: No prevertebral fluid or swelling. No visible canal hematoma. Disc levels: No advanced spinal canal or neural foraminal stenosis. Upper chest: No pneumothorax, pulmonary nodule or pleural effusion. Other: Normal visualized paraspinal cervical soft tissues. IMPRESSION: 1. No acute intracranial abnormality. 2. No acute fracture or static subluxation of the cervical spine. Electronically Signed   By: Deatra Robinson M.D.   On: 09/23/2023 03:29   CT Cervical Spine Wo Contrast  Result Date: 09/23/2023 CLINICAL  DATA:  Fall EXAM: CT HEAD WITHOUT CONTRAST CT CERVICAL SPINE WITHOUT CONTRAST TECHNIQUE: Multidetector CT imaging of the head and cervical spine was performed following the standard protocol without intravenous contrast. Multiplanar CT image reconstructions of the cervical spine were also generated. RADIATION DOSE REDUCTION: This exam was performed according to the departmental dose-optimization program which includes automated exposure control, adjustment of the mA and/or kV according to patient size and/or use of iterative reconstruction technique. COMPARISON:  08/23/2023 FINDINGS: CT HEAD FINDINGS Brain: There is no mass, hemorrhage or extra-axial collection. The size and configuration of the ventricles and extra-axial CSF spaces are normal. The brain parenchyma is normal, without evidence of acute or chronic infarction. Vascular: No abnormal hyperdensity of the major intracranial arteries or  dural venous sinuses. No intracranial atherosclerosis. Skull: The visualized skull base, calvarium and extracranial soft tissues are normal. Sinuses/Orbits: No fluid levels or advanced mucosal thickening of the visualized paranasal sinuses. No mastoid or middle ear effusion. The orbits are normal. CT CERVICAL SPINE FINDINGS Alignment: No static subluxation. Facets are aligned. Occipital condyles are normally positioned. Skull base and vertebrae: No acute fracture. Soft tissues and spinal canal: No prevertebral fluid or swelling. No visible canal hematoma. Disc levels: No advanced spinal canal or neural foraminal stenosis. Upper chest: No pneumothorax, pulmonary nodule or pleural effusion. Other: Normal visualized paraspinal cervical soft tissues. IMPRESSION: 1. No acute intracranial abnormality. 2. No acute fracture or static subluxation of the cervical spine. Electronically Signed   By: Deatra Robinson M.D.   On: 09/23/2023 03:29     Subjective: Patient seen examined bedside, resting company.  Lying in bed.  Son present.   Discharging home with home health services after son received teaching this morning from therapy.  Patient and family have continued decline SNF placement which was the current therapy recommendations.  Discussed with patient that she will need to ensure follow-up with her pain specialist versus orthopedics for any further pain medications.  No other specific questions or concerns at this time.  Denies headache, no visual changes, no chest pain, no palpitations, no shortness of breath, no abdominal pain, no fever/chills/night sweats, no nausea/vomiting/diarrhea, no focal weakness, no fatigue, no paresthesias.  No acute events overnight per nursing staff.  Discharge Exam: Vitals:   10/02/23 0321 10/02/23 0800  BP: 93/66 105/68  Pulse: 92 86  Resp: 20   Temp: 98.2 F (36.8 C) 98.4 F (36.9 C)  SpO2: 93% 100%   Vitals:   10/01/23 2053 10/02/23 0321 10/02/23 0322 10/02/23 0800  BP: 128/79 93/66  105/68  Pulse: 80 92  86  Resp: (!) 24 20    Temp: 98.5 F (36.9 C) 98.2 F (36.8 C)  98.4 F (36.9 C)  TempSrc: Oral Oral  Oral  SpO2: 100% 93%  100%  Weight:   115.6 kg   Height:        Physical Exam: GEN: NAD, alert and oriented x 3, obese HEENT: NCAT, PERRL, EOMI, sclera clear, MMM PULM: CTAB w/o wheezes/crackles, normal respiratory effort, on 2 L nasal With SpO2 100% at rest CV: RRR w/o M/G/R GI: abd soft, NTND, NABS, no R/G/M MSK: no peripheral edema, moves all extremity dependently, strength preserved, right lower extremity with surgical dressing/splints with Ace wrap in place, clean/dry/intact, neurovascular intact NEURO: CN II-XII intact, no focal deficits, sensation to light touch intact PSYCH: normal mood/affect Integumentary: Right lower extremity dressing/splint in place as above, otherwise no other concerning rashes/lesions/wounds noted on exposed skin surfaces.    The results of significant diagnostics from this hospitalization (including imaging, microbiology, ancillary  and laboratory) are listed below for reference.     Microbiology: Recent Results (from the past 240 hour(s))  Surgical pcr screen     Status: None   Collection Time: 09/23/23  2:41 PM   Specimen: Nasal Mucosa; Nasal Swab  Result Value Ref Range Status   MRSA, PCR NEGATIVE NEGATIVE Final   Staphylococcus aureus NEGATIVE NEGATIVE Final    Comment: (NOTE) The Xpert SA Assay (FDA approved for NASAL specimens in patients 57 years of age and older), is one component of a comprehensive surveillance program. It is not intended to diagnose infection nor to guide or monitor treatment. Performed at [facility], [address]  [place], [place] [address]      Labs: BNP (last 3 results) Recent Labs    08/23/23 0002  BNP 71.8   Basic Metabolic Panel: Recent Labs  Lab 09/26/23 0248 09/30/23 0245  NA 141 138  K 3.8 3.5  CL 101 98  CO2 31 31  GLUCOSE 107* 127*  BUN 8 15  CREATININE 0.79 1.16*  CALCIUM 8.9 8.6*  MG 1.9  --   PHOS 3.8  --    Liver Function Tests: No results for input(s): "AST", "ALT", "ALKPHOS", "BILITOT", "PROT", "ALBUMIN" in the last 168 hours. No results for input(s): "LIPASE", "AMYLASE" in the last 168 hours. No results for input(s): "AMMONIA" in the last 168 hours. CBC: Recent Labs  Lab 09/26/23 0248 09/30/23 0245  WBC 6.4 6.7  HGB 9.5* 9.5*  HCT 32.7* 32.1*  MCV 87.2 87.7  PLT 251 299   Cardiac Enzymes: No results for input(s): "CKTOTAL", "CKMB", "CKMBINDEX", "TROPONINI" in the last 168 hours. BNP: Invalid input(s): "POCBNP" CBG: No results for input(s): "GLUCAP" in the last 168 hours. D-Dimer No results for input(s): "DDIMER" in the last 72 hours. Hgb A1c No results for input(s): "HGBA1C" in the last 72 hours. Lipid Profile No results for input(s): "CHOL", "HDL", "LDLCALC", "TRIG", "CHOLHDL", "LDLDIRECT" in the last 72 hours. Thyroid function studies No results for input(s): "TSH", "T4TOTAL", "T3FREE", "THYROIDAB" in the last 72  hours.  Invalid input(s): "FREET3" Anemia work up No results for input(s): "VITAMINB12", "FOLATE", "FERRITIN", "TIBC", "IRON", "RETICCTPCT" in the last 72 hours. Urinalysis    Component Value Date/Time   COLORURINE YELLOW 06/13/2021 0712   APPEARANCEUR HAZY (A) 06/13/2021 0712   LABSPEC 1.014 06/13/2021 0712   PHURINE 5.0 06/13/2021 0712   GLUCOSEU NEGATIVE 06/13/2021 0712   HGBUR NEGATIVE 06/13/2021 0712   BILIRUBINUR NEGATIVE 06/13/2021 0712   KETONESUR NEGATIVE 06/13/2021 0712   PROTEINUR NEGATIVE 06/13/2021 0712   UROBILINOGEN 1.0 12/07/2014 0200   NITRITE NEGATIVE 06/13/2021 0712   LEUKOCYTESUR NEGATIVE 06/13/2021 0712   Sepsis Labs Recent Labs  Lab 09/26/23 0248 09/30/23 0245  WBC 6.4 6.7   Microbiology Recent Results (from the past 240 hour(s))  Surgical pcr screen     Status: None   Collection Time: 09/23/23  2:41 PM   Specimen: Nasal Mucosa; Nasal Swab  Result Value Ref Range Status   MRSA, PCR NEGATIVE NEGATIVE Final   Staphylococcus aureus NEGATIVE NEGATIVE Final    Comment: (NOTE) The Xpert SA Assay (FDA approved for NASAL specimens in patients 88 years of age and older), is one component of a comprehensive surveillance program. It is not intended to diagnose infection nor to guide or monitor treatment. Performed at [facility], [address], [place], [place] [address]      Time coordinating discharge: Over 30 minutes  SIGNED:   Alvira Philips [place], DO  [facility] 10/02/2023, 10:15 AM

## 2023-10-02 NOTE — Progress Notes (Signed)
Occupational Therapy Treatment/discharge Patient Details Name: Teresa Wiley MRN: [id number] DOB: 09-27-1956 Today's Date: 10/02/2023   History of present illness Patient is a 67 y/o female admitted 09/23/23 due to fall when she reached down and tripped on oxygen tubing.  Found to have R tibia fx and proximal fibular maisonneuve fx.  She underwent IM nail of tibia fx on same day.  PMH includes chronic hypercapnic respiratory failure due to pulmonary hypertension on chronic home O2, bronchomalacia, ASD s/p percutaneous closure 10/23, PE 2017, HTN, DM II and chronic pain syndrome.   OT comments  Completed family training with patient and son, Teresa Wiley. Therapist educated family regarding bed mobility, functional transfers using SB, toilet transfers, and compensatory strategies to complete BADL tasks. Recommend main level living  and use of SB for safety due to WB status. Recommend purchasing single bed rail from [employer]. DME provided include drop down BSC, SB, and manual WC with elevating leg rests. WC cushion has not been delivered at this point and TOC will be informed. Education provided for gait belt use as leg lifter and to help secure RLE on WC leg rest as flip up foot rest is unable to be used d/t LE length. Son completed hands on training for SB transfers, and [facility] management with therapist providing VC as needed for technique and form. All education completed and all questions addressed. Plan is to discharge home today with family assisting with home health OT services recommended. Acute OT will sign off.        If plan is discharge home, recommend the following:  Assistance with cooking/housework;Assistance with feeding;Assist for transportation;Help with stairs or ramp for entrance;A little help with bathing/dressing/bathroom;A little help with walking and/or transfers   Equipment Recommendations  BSC/3in1;Wheelchair (measurements OT);Wheelchair cushion (measurements OT);Other (comment)  (slideboard)       Precautions / Restrictions Precautions Precautions: Fall Required Braces or Orthoses: Splint/Cast Splint/Cast: R lower leg Restrictions Weight Bearing Restrictions: Yes RLE Weight Bearing: Non weight bearing       Mobility Bed Mobility Overal bed mobility: Needs Assistance Bed Mobility: Supine to Sit, Sit to Supine     Supine to sit: Contact guard, Used rails Sit to supine: Contact guard assist, Used rails   General bed mobility comments: Son educated on recommendation of bed rail at home with online resource provided to purchase. Pt required increased time during bed mobility and assist with RLE on/off bed. Son assisted.    Transfers   Equipment used: Sliding board Transfers: Bed to chair/wheelchair/BSC     Lateral/Scoot Transfers: Min assist, With slide board General transfer comment: Min A provided to stabilize SB and WC during transfer to/from bed to WC. Son and pt educated on safety awareness, proper SB placement, compensatory strategies during transfer. Verbal education and visual demonstration provided for [facility] management including brakes, elevating leg rests, and removeable/swing back arm rests. Son was able to demonstrate understanding of WC parts. Required VC during slideboard transfer for proper technique.     Balance Overall balance assessment: Needs assistance Sitting-balance support: Bilateral upper extremity supported, Feet supported Sitting balance-Leahy Scale: Good Sitting balance - Comments: on EOB with both feet on floor       Standing balance comment: Recommend pt utilize SB at home due to WB restrictions. Standing was not completed during session.        ADL either performed or assessed with clinical judgement   ADL Overall ADL's : Needs assistance/impaired      Upper Body Dressing :  Set up;Sitting         Toilet Transfer Details (indicate cue type and reason): Pt declined to practice toilet transfer during session. Verbal  education provided to pt and son on toilet transfer technique with and/or without SB and safety strategies. Both verbalized understanding.       Tub/Shower Transfer Details (indicate cue type and reason): Recommended pt wait to perform tub/shower transfer until home health therapist is able to assist.          Cognition Arousal: Alert Behavior During Therapy: Anxious, Lability Overall Cognitive Status: Impaired/Different from baseline Area of Impairment: Following commands, Problem solving      Current Attention Level: Sustained   Following Commands: Follows one step commands consistently     Problem Solving: Requires verbal cues, Difficulty sequencing General Comments: VC \\provided  to decrease any impulsivity. Education provided throughout session with pt and son regarding safety awareness. Cues to adhere WB precautions.              General Comments On 2L [place] with VSS. Pt did demonstrate some anxiety with overall situation. Provided reassurance and comfort as able during session to help increase comfort level.    Pertinent Vitals/ Pain       Pain Assessment Pain Assessment: Faces Faces Pain Scale: Hurts whole lot Pain Location: R LE with movement Pain Descriptors / Indicators: Grimacing, Guarding Pain Intervention(s): RN gave pain meds during session, Limited activity within patient's tolerance, Monitored during session, Repositioned         Frequency  Min 1X/week        Progress Toward Goals  OT Goals(current goals can now be found in the care plan section)  Progress towards OT goals: Goals met/education completed, patient discharged from OT         Co-evaluation    PT/OT/SLP Co-Evaluation/Treatment: Yes Reason for Co-Treatment: To address functional/ADL transfers;Other (comment) (pt/family education)   OT goals addressed during session: ADL's and self-care;Proper use of [employer] equipment and DME      AM-PAC OT "6 Clicks" Daily Activity      Outcome Measure   Help from another person eating meals?: None Help from another person taking care of personal grooming?: A Little Help from another person toileting, which includes using toliet, bedpan, or urinal?: Total (foley) Help from another person bathing (including washing, rinsing, drying)?: A Lot Help from another person to put on and taking off regular upper body clothing?: A Little Help from another person to put on and taking off regular lower body clothing?: Total 6 Click Score: 14    End of Session Equipment Utilized During Treatment: Oxygen;Other (comment) (WC and slideboard)  OT Visit Diagnosis: Unsteadiness on feet (R26.81);Other abnormalities of gait and mobility (R26.89);Pain Pain - Right/Left: Right Pain - part of body: Leg   Activity Tolerance Patient tolerated treatment well   Patient Left in bed;with call bell/phone within reach;with bed alarm set;with family/visitor present   Nurse Communication Other (comment) (ready for discharge with family training completed)        Time: 1002-1110 OT Time Calculation (min): 68 min  Charges: OT General Charges $OT Visit: 1 Visit OT Treatments $Self Care/Home Management : 8-22 mins $Therapeutic Activity: 23-37 mins  [employer], OTR/L,CBIS  Supplemental OT - [facility] and [facility] Secure Chat Preferred    Tamsin Nader, Charisse March 10/02/2023, 12:09 PM

## 2023-10-02 NOTE — Plan of Care (Signed)
  Problem: Pain Management: Goal: General experience of comfort will improve Outcome: Progressing   Problem: Safety: Goal: Ability to remain free from injury will improve Outcome: Progressing

## 2023-10-02 NOTE — Progress Notes (Signed)
Pt declined going home with a foley catheter expressing ' I did not have issues with voiding,I just held back my urine to avoid having accidents with bowel movements in bed'. MD notified and order for foley catheter discontinued. Catheter removed before discharge. Discharge teaching provided with all concerns addressed

## 2023-11-21 ENCOUNTER — Encounter: Payer: Self-pay | Admitting: Internal Medicine

## 2023-11-24 ENCOUNTER — Ambulatory Visit: Payer: Medicare Other | Admitting: Internal Medicine

## 2023-11-24 ENCOUNTER — Encounter: Payer: Self-pay | Admitting: Internal Medicine

## 2023-12-15 ENCOUNTER — Other Ambulatory Visit: Payer: Self-pay

## 2023-12-15 ENCOUNTER — Emergency Department (HOSPITAL_COMMUNITY): Payer: Medicare Other

## 2023-12-15 ENCOUNTER — Inpatient Hospital Stay (HOSPITAL_COMMUNITY)
Admission: EM | Admit: 2023-12-15 | Discharge: 2023-12-16 | DRG: 191 | Disposition: A | Discharge status: Home / Self Care | Payer: Medicare Other | Attending: Internal Medicine | Admitting: Internal Medicine

## 2023-12-15 DIAGNOSIS — Z91040 Latex allergy status: Secondary | ICD-10-CM | POA: Diagnosis not present

## 2023-12-15 DIAGNOSIS — I272 Pulmonary hypertension, unspecified: Secondary | ICD-10-CM | POA: Diagnosis not present

## 2023-12-15 DIAGNOSIS — E119 Type 2 diabetes mellitus without complications: Secondary | ICD-10-CM | POA: Diagnosis not present

## 2023-12-15 DIAGNOSIS — Z7985 Long-term (current) use of injectable non-insulin antidiabetic drugs: Secondary | ICD-10-CM

## 2023-12-15 DIAGNOSIS — G4733 Obstructive sleep apnea (adult) (pediatric): Secondary | ICD-10-CM | POA: Diagnosis present

## 2023-12-15 DIAGNOSIS — J9612 Chronic respiratory failure with hypercapnia: Secondary | ICD-10-CM | POA: Diagnosis present

## 2023-12-15 DIAGNOSIS — Z91041 Radiographic dye allergy status: Secondary | ICD-10-CM

## 2023-12-15 DIAGNOSIS — I959 Hypotension, unspecified: Secondary | ICD-10-CM | POA: Diagnosis present

## 2023-12-15 DIAGNOSIS — Z1152 Encounter for screening for COVID-19: Secondary | ICD-10-CM

## 2023-12-15 DIAGNOSIS — Z91013 Allergy to seafood: Secondary | ICD-10-CM

## 2023-12-15 DIAGNOSIS — Z79891 Long term (current) use of opiate analgesic: Secondary | ICD-10-CM | POA: Diagnosis not present

## 2023-12-15 DIAGNOSIS — J9611 Chronic respiratory failure with hypoxia: Secondary | ICD-10-CM | POA: Diagnosis present

## 2023-12-15 DIAGNOSIS — Z87891 Personal history of nicotine dependence: Secondary | ICD-10-CM | POA: Insufficient documentation

## 2023-12-15 DIAGNOSIS — Z823 Family history of stroke: Secondary | ICD-10-CM | POA: Diagnosis not present

## 2023-12-15 DIAGNOSIS — F419 Anxiety disorder, unspecified: Secondary | ICD-10-CM | POA: Diagnosis present

## 2023-12-15 DIAGNOSIS — J479 Bronchiectasis, uncomplicated: Secondary | ICD-10-CM | POA: Diagnosis not present

## 2023-12-15 DIAGNOSIS — Z86711 Personal history of pulmonary embolism: Secondary | ICD-10-CM | POA: Diagnosis not present

## 2023-12-15 DIAGNOSIS — G894 Chronic pain syndrome: Secondary | ICD-10-CM | POA: Diagnosis not present

## 2023-12-15 DIAGNOSIS — Z20822 Contact with and (suspected) exposure to covid-19: Secondary | ICD-10-CM | POA: Insufficient documentation

## 2023-12-15 DIAGNOSIS — N179 Acute kidney failure, unspecified: Secondary | ICD-10-CM | POA: Diagnosis present

## 2023-12-15 DIAGNOSIS — Z7901 Long term (current) use of anticoagulants: Secondary | ICD-10-CM | POA: Insufficient documentation

## 2023-12-15 DIAGNOSIS — I10 Essential (primary) hypertension: Secondary | ICD-10-CM | POA: Diagnosis present

## 2023-12-15 DIAGNOSIS — Z8249 Family history of ischemic heart disease and other diseases of the circulatory system: Secondary | ICD-10-CM

## 2023-12-15 DIAGNOSIS — R072 Precordial pain: Secondary | ICD-10-CM | POA: Diagnosis not present

## 2023-12-15 DIAGNOSIS — M51369 Other intervertebral disc degeneration, lumbar region without mention of lumbar back pain or lower extremity pain: Secondary | ICD-10-CM | POA: Diagnosis present

## 2023-12-15 DIAGNOSIS — Z9981 Dependence on supplemental oxygen: Secondary | ICD-10-CM

## 2023-12-15 DIAGNOSIS — J4489 Other specified chronic obstructive pulmonary disease: Secondary | ICD-10-CM | POA: Diagnosis not present

## 2023-12-15 DIAGNOSIS — Z888 Allergy status to other drugs, medicaments and biological substances status: Secondary | ICD-10-CM

## 2023-12-15 DIAGNOSIS — J181 Lobar pneumonia, unspecified organism: Secondary | ICD-10-CM | POA: Insufficient documentation

## 2023-12-15 DIAGNOSIS — R0789 Other chest pain: Secondary | ICD-10-CM | POA: Diagnosis present

## 2023-12-15 DIAGNOSIS — R5381 Other malaise: Secondary | ICD-10-CM | POA: Diagnosis not present

## 2023-12-15 DIAGNOSIS — Z8774 Personal history of (corrected) congenital malformations of heart and circulatory system: Secondary | ICD-10-CM | POA: Insufficient documentation

## 2023-12-15 DIAGNOSIS — G8929 Other chronic pain: Secondary | ICD-10-CM | POA: Diagnosis present

## 2023-12-15 DIAGNOSIS — J189 Pneumonia, unspecified organism: Secondary | ICD-10-CM

## 2023-12-15 DIAGNOSIS — Z91014 Allergy to mammalian meats: Secondary | ICD-10-CM

## 2023-12-15 DIAGNOSIS — Z9049 Acquired absence of other specified parts of digestive tract: Secondary | ICD-10-CM

## 2023-12-15 DIAGNOSIS — Z79899 Other long term (current) drug therapy: Secondary | ICD-10-CM | POA: Insufficient documentation

## 2023-12-15 DIAGNOSIS — M549 Dorsalgia, unspecified: Secondary | ICD-10-CM | POA: Diagnosis present

## 2023-12-15 HISTORY — DX: Cerebral infarction, unspecified: I63.9

## 2023-12-15 MED ORDER — SODIUM CHLORIDE 0.9 % IV BOLUS
500.0000 mL | Freq: Once | INTRAVENOUS | Status: AC
  Administered 2023-12-16: 500 mL via INTRAVENOUS

## 2023-12-15 MED ORDER — FENTANYL CITRATE PF 50 MCG/ML IJ SOSY
50.0000 ug | PREFILLED_SYRINGE | Freq: Once | INTRAMUSCULAR | Status: AC
  Administered 2023-12-16: 50 ug via INTRAVENOUS
  Filled 2023-12-15: qty 1

## 2023-12-15 NOTE — ED Provider Notes (Signed)
EMERGENCY DEPARTMENT AT [facility] Provider Note   CSN: [id number] Arrival date & time: 12/15/23  2336     History {Add pertinent medical, surgical, social history, OB history to HPI:1} No chief complaint on file.   Teresa Wiley is a 67 y.o. female.  The history is provided by the patient.  Teresa Wiley is a 68 y.o. female who presents to the Emergency Department complaining of chest pain.  She presents to the emergency department by EMS for evaluation of chest pain that started about 2 hours prior to ED arrival.  She reports sudden onset severe generalized chest pain with associated shortness of breath.  No fevers.  She does have some upper abdominal discomfort.  No nausea, vomiting.     Home Medications Prior to Admission medications   Medication Sig Start Date End Date Taking? Authorizing Provider  ALPRAZolam Prudy Feeler) 1 MG tablet Take 1 mg by mouth 3 (three) times daily. 06/06/20   [provider]  amoxicillin (AMOXIL) 500 MG tablet Take 4 tablets (2,000 mg total) by mouth as directed. 1 hour prior to dental work including cleanings 11/07/22   Janetta Hora, PA-C  apixaban (ELIQUIS) 2.5 MG TABS tablet Take 1 tablet (2.5 mg total) by mouth 2 (two) times daily. 10/02/23 11/01/23  [place], Alvira Philips, DO  atorvastatin (LIPITOR) 20 MG tablet Take 20 mg by mouth daily. 08/09/23   [provider]  carvedilol (COREG) 6.25 MG tablet TAKE 1 TABLET(6.25 MG) BY MOUTH TWICE DAILY WITH A MEAL 12/25/22   Bensimhon, Bevelyn Buckles, MD  cyclobenzaprine (FLEXERIL) 10 MG tablet Take 10 mg by mouth 3 (three) times daily as needed for muscle spasms. 09/09/23   [provider]  diclofenac Sodium (VOLTAREN ARTHRITIS PAIN) 1 % GEL Apply 2 g topically in the morning and at bedtime.    [provider]  diphenhydrAMINE (BENADRYL) 25 MG tablet Take 100 mg by mouth at bedtime as needed for sleep.    [provider]  furosemide (LASIX) 40 MG  tablet Take 40 mg by mouth daily.    [provider]  gabapentin (NEURONTIN) 600 MG tablet Take 600 mg by mouth 3 (three) times daily.    [provider]  OXYGEN Inhale 2 L into the lungs continuous.    [provider]  OZEMPIC, 2 MG/DOSE, 8 MG/3ML SOPN Inject 2 mg into the skin every Wednesday. 09/05/23   [provider]  pantoprazole (PROTONIX) 40 MG tablet Take 40 mg by mouth 2 (two) times daily. 05/24/22   [provider]  sildenafil (REVATIO) 20 MG tablet Take 1 tablet (20 mg total) by mouth 3 (three) times daily. 06/14/21   Albertine Grates, MD  tiZANidine (ZANAFLEX) 4 MG tablet Take 1 tablet (4 mg total) by mouth every 6 (six) hours as needed for muscle spasms. 10/02/23 10/01/24  [place], Alvira Philips, DO      Allergies    Belbuca [buprenorphine hcl], E.e.s. [erythromycin], Iodine, Shellfish allergy, Pork-derived products, and Other    Review of Systems   Review of Systems  All other systems reviewed and are negative.   Physical Exam Updated Vital Signs There were no vitals taken for this visit. Physical Exam Vitals and nursing note reviewed.  Constitutional:      Appearance: She is well-developed.  HENT:     Head: Normocephalic and atraumatic.  Cardiovascular:     Rate and Rhythm: Regular rhythm. Tachycardia present.     Heart sounds: No murmur heard. Pulmonary:  Effort: Pulmonary effort is normal. No respiratory distress.     Breath sounds: Normal breath sounds.  Abdominal:     Palpations: Abdomen is soft.     Tenderness: There is no abdominal tenderness. There is no guarding or rebound.  Musculoskeletal:        General: No swelling or tenderness.  Skin:    General: Skin is warm and dry.  Neurological:     Mental Status: She is alert and oriented to person, place, and time.  Psychiatric:        Behavior: Behavior normal.     ED Results / Procedures / Treatments   Labs (all labs ordered are listed, but only abnormal results are  displayed) Labs Reviewed  COMPREHENSIVE METABOLIC PANEL  BRAIN NATRIURETIC PEPTIDE  CBC WITH DIFFERENTIAL/PLATELET  TROPONIN I (HIGH SENSITIVITY)    EKG None  Radiology No results found.  Procedures Procedures  {Document cardiac monitor, telemetry assessment procedure when appropriate:1}  Medications Ordered in ED Medications  fentaNYL (SUBLIMAZE) injection 50 mcg (has no administration in time range)    ED Course/ Medical Decision Making/ A&P   {   Click here for ABCD2, HEART and other calculatorsREFRESH Note before signing :1}                              Medical Decision Making Amount and/or Complexity of Data Reviewed Labs: ordered. Radiology: ordered.  Risk Prescription drug management.   ***  {Document critical care time when appropriate:1} {Document review of labs and clinical decision tools ie heart score, Chads2Vasc2 etc:1}  {Document your independent review of radiology images, and any outside records:1} {Document your discussion with family members, caretakers, and with consultants:1} {Document social determinants of health affecting pt's care:1} {Document your decision making why or why not admission, treatments were needed:1} Final Clinical Impression(s) / ED Diagnoses Final diagnoses:  None    Rx / DC Orders ED Discharge Orders     None

## 2023-12-15 NOTE — ED Triage Notes (Signed)
Pt BIB EMS for vision changes, crushing chest pain, and generalized weakness. HR 140 with EMS BP 90/50. 3LNC at baseline. 324 ASA given by EMS. A&Ox4, denies LOC, denies ShOB.

## 2023-12-16 ENCOUNTER — Encounter (HOSPITAL_COMMUNITY): Payer: Self-pay | Admitting: Internal Medicine

## 2023-12-16 ENCOUNTER — Emergency Department (HOSPITAL_COMMUNITY): Payer: Medicare Other

## 2023-12-16 ENCOUNTER — Other Ambulatory Visit (HOSPITAL_COMMUNITY): Payer: Self-pay

## 2023-12-16 DIAGNOSIS — Z8249 Family history of ischemic heart disease and other diseases of the circulatory system: Secondary | ICD-10-CM | POA: Diagnosis not present

## 2023-12-16 DIAGNOSIS — F419 Anxiety disorder, unspecified: Secondary | ICD-10-CM

## 2023-12-16 DIAGNOSIS — Z79891 Long term (current) use of opiate analgesic: Secondary | ICD-10-CM | POA: Diagnosis not present

## 2023-12-16 DIAGNOSIS — Z7901 Long term (current) use of anticoagulants: Secondary | ICD-10-CM | POA: Diagnosis not present

## 2023-12-16 DIAGNOSIS — Z7985 Long-term (current) use of injectable non-insulin antidiabetic drugs: Secondary | ICD-10-CM | POA: Diagnosis not present

## 2023-12-16 DIAGNOSIS — Z87891 Personal history of nicotine dependence: Secondary | ICD-10-CM | POA: Diagnosis not present

## 2023-12-16 DIAGNOSIS — R5381 Other malaise: Secondary | ICD-10-CM

## 2023-12-16 DIAGNOSIS — R072 Precordial pain: Secondary | ICD-10-CM | POA: Diagnosis not present

## 2023-12-16 DIAGNOSIS — I272 Pulmonary hypertension, unspecified: Secondary | ICD-10-CM | POA: Diagnosis not present

## 2023-12-16 DIAGNOSIS — I10 Essential (primary) hypertension: Secondary | ICD-10-CM

## 2023-12-16 DIAGNOSIS — Z823 Family history of stroke: Secondary | ICD-10-CM | POA: Diagnosis not present

## 2023-12-16 DIAGNOSIS — J189 Pneumonia, unspecified organism: Secondary | ICD-10-CM | POA: Diagnosis not present

## 2023-12-16 DIAGNOSIS — Z86711 Personal history of pulmonary embolism: Secondary | ICD-10-CM | POA: Diagnosis not present

## 2023-12-16 DIAGNOSIS — Z9981 Dependence on supplemental oxygen: Secondary | ICD-10-CM | POA: Diagnosis not present

## 2023-12-16 DIAGNOSIS — E119 Type 2 diabetes mellitus without complications: Secondary | ICD-10-CM | POA: Diagnosis not present

## 2023-12-16 DIAGNOSIS — R0789 Other chest pain: Secondary | ICD-10-CM | POA: Diagnosis present

## 2023-12-16 DIAGNOSIS — J479 Bronchiectasis, uncomplicated: Secondary | ICD-10-CM

## 2023-12-16 DIAGNOSIS — N179 Acute kidney failure, unspecified: Secondary | ICD-10-CM | POA: Diagnosis not present

## 2023-12-16 DIAGNOSIS — J4489 Other specified chronic obstructive pulmonary disease: Secondary | ICD-10-CM | POA: Diagnosis not present

## 2023-12-16 DIAGNOSIS — Z1152 Encounter for screening for COVID-19: Secondary | ICD-10-CM | POA: Diagnosis not present

## 2023-12-16 DIAGNOSIS — Z91040 Latex allergy status: Secondary | ICD-10-CM | POA: Diagnosis not present

## 2023-12-16 DIAGNOSIS — J9611 Chronic respiratory failure with hypoxia: Secondary | ICD-10-CM

## 2023-12-16 DIAGNOSIS — Z8774 Personal history of (corrected) congenital malformations of heart and circulatory system: Secondary | ICD-10-CM | POA: Diagnosis not present

## 2023-12-16 DIAGNOSIS — G894 Chronic pain syndrome: Secondary | ICD-10-CM

## 2023-12-16 DIAGNOSIS — Z79899 Other long term (current) drug therapy: Secondary | ICD-10-CM | POA: Diagnosis not present

## 2023-12-16 DIAGNOSIS — J9612 Chronic respiratory failure with hypercapnia: Secondary | ICD-10-CM | POA: Diagnosis not present

## 2023-12-16 LAB — CBC WITH DIFFERENTIAL/PLATELET
Abs Immature Granulocytes: 0.06 10*3/uL (ref 0.00–0.07)
Basophils Absolute: 0 10*3/uL (ref 0.0–0.1)
Basophils Relative: 0 %
Eosinophils Absolute: 0.6 10*3/uL — ABNORMAL HIGH (ref 0.0–0.5)
Eosinophils Relative: 5 %
HCT: 40 % (ref 36.0–46.0)
Hemoglobin: 11.5 g/dL — ABNORMAL LOW (ref 12.0–15.0)
Immature Granulocytes: 1 %
Lymphocytes Relative: 13 %
Lymphs Abs: 1.6 10*3/uL (ref 0.7–4.0)
MCH: 25.7 pg — ABNORMAL LOW (ref 26.0–34.0)
MCHC: 28.8 g/dL — ABNORMAL LOW (ref 30.0–36.0)
MCV: 89.5 fL (ref 80.0–100.0)
Monocytes Absolute: 0.8 10*3/uL (ref 0.1–1.0)
Monocytes Relative: 7 %
Neutro Abs: 9.5 10*3/uL — ABNORMAL HIGH (ref 1.7–7.7)
Neutrophils Relative %: 74 %
Platelets: 351 10*3/uL (ref 150–400)
RBC: 4.47 MIL/uL (ref 3.87–5.11)
RDW: 15.2 % (ref 11.5–15.5)
WBC: 12.6 10*3/uL — ABNORMAL HIGH (ref 4.0–10.5)
nRBC: 0 % (ref 0.0–0.2)

## 2023-12-16 LAB — BRAIN NATRIURETIC PEPTIDE: B Natriuretic Peptide: 48.5 pg/mL (ref 0.0–100.0)

## 2023-12-16 LAB — I-STAT CHEM 8, ED
BUN: 10 mg/dL (ref 8–23)
Calcium, Ion: 1.17 mmol/L (ref 1.15–1.40)
Chloride: 98 mmol/L (ref 98–111)
Creatinine, Ser: 1.3 mg/dL — ABNORMAL HIGH (ref 0.44–1.00)
Glucose, Bld: 142 mg/dL — ABNORMAL HIGH (ref 70–99)
HCT: 39 % (ref 36.0–46.0)
Hemoglobin: 13.3 g/dL (ref 12.0–15.0)
Potassium: 4.1 mmol/L (ref 3.5–5.1)
Sodium: 142 mmol/L (ref 135–145)
TCO2: 36 mmol/L — ABNORMAL HIGH (ref 22–32)

## 2023-12-16 LAB — RESP PANEL BY RT-PCR (RSV, FLU A&B, COVID)  RVPGX2
Influenza A by PCR: NEGATIVE
Influenza B by PCR: NEGATIVE
Resp Syncytial Virus by PCR: NEGATIVE
SARS Coronavirus 2 by RT PCR: NEGATIVE

## 2023-12-16 LAB — COMPREHENSIVE METABOLIC PANEL
ALT: 8 U/L (ref 0–44)
AST: 15 U/L (ref 15–41)
Albumin: 3.2 g/dL — ABNORMAL LOW (ref 3.5–5.0)
Alkaline Phosphatase: 101 U/L (ref 38–126)
Anion gap: 10 (ref 5–15)
BUN: 8 mg/dL (ref 8–23)
CO2: 31 mmol/L (ref 22–32)
Calcium: 8.8 mg/dL — ABNORMAL LOW (ref 8.9–10.3)
Chloride: 99 mmol/L (ref 98–111)
Creatinine, Ser: 1.16 mg/dL — ABNORMAL HIGH (ref 0.44–1.00)
GFR, Estimated: 52 mL/min — ABNORMAL LOW (ref >60)
Glucose, Bld: 144 mg/dL — ABNORMAL HIGH (ref 70–99)
Potassium: 4.1 mmol/L (ref 3.5–5.1)
Sodium: 140 mmol/L (ref 135–145)
Total Bilirubin: 0.7 mg/dL (ref 0.0–1.2)
Total Protein: 7.4 g/dL (ref 6.5–8.1)

## 2023-12-16 LAB — I-STAT CG4 LACTIC ACID, ED: Lactic Acid, Venous: 1.5 mmol/L (ref 0.5–1.9)

## 2023-12-16 LAB — TROPONIN I (HIGH SENSITIVITY)
Troponin I (High Sensitivity): 3 ng/L (ref <18)
Troponin I (High Sensitivity): 3 ng/L (ref <18)

## 2023-12-16 LAB — HIV ANTIBODY (ROUTINE TESTING W REFLEX): HIV Screen 4th Generation wRfx: NONREACTIVE

## 2023-12-16 LAB — LIPASE, BLOOD: Lipase: 28 U/L (ref 11–51)

## 2023-12-16 MED ORDER — ATORVASTATIN CALCIUM 10 MG PO TABS
20.0000 mg | ORAL_TABLET | Freq: Every day | ORAL | Status: DC
  Administered 2023-12-16: 20 mg via ORAL
  Filled 2023-12-16: qty 2

## 2023-12-16 MED ORDER — SODIUM CHLORIDE 0.9 % IV SOLN: INTRAVENOUS | Status: DC

## 2023-12-16 MED ORDER — CEFADROXIL 500 MG PO CAPS
500.0000 mg | ORAL_CAPSULE | Freq: Two times a day (BID) | ORAL | Status: DC
Start: 2023-12-16 — End: 2023-12-16

## 2023-12-16 MED ORDER — DOXYCYCLINE HYCLATE 100 MG PO TABS
100.0000 mg | ORAL_TABLET | Freq: Two times a day (BID) | ORAL | 0 refills | Status: AC
  Filled 2023-12-16: qty 10, 5d supply, fill #0

## 2023-12-16 MED ORDER — EPINEPHRINE 0.3 MG/0.3ML IJ SOAJ
INTRAMUSCULAR | Status: AC
  Filled 2023-12-16: qty 0.3

## 2023-12-16 MED ORDER — CARVEDILOL 6.25 MG PO TABS
6.2500 mg | ORAL_TABLET | Freq: Two times a day (BID) | ORAL | Status: DC
  Administered 2023-12-16: 6.25 mg via ORAL
  Filled 2023-12-16: qty 2

## 2023-12-16 MED ORDER — GABAPENTIN 300 MG PO CAPS
600.0000 mg | ORAL_CAPSULE | Freq: Three times a day (TID) | ORAL | Status: DC
  Administered 2023-12-16 (×2): 600 mg via ORAL
  Filled 2023-12-16 (×2): qty 2

## 2023-12-16 MED ORDER — IOHEXOL 350 MG/ML SOLN
75.0000 mL | Freq: Once | INTRAVENOUS | Status: AC | PRN
  Administered 2023-12-16: 75 mL via INTRAVENOUS

## 2023-12-16 MED ORDER — PREDNISONE 20 MG PO TABS
40.0000 mg | ORAL_TABLET | Freq: Every day | ORAL | Status: DC
  Administered 2023-12-16: 40 mg via ORAL
  Filled 2023-12-16: qty 2

## 2023-12-16 MED ORDER — SODIUM CHLORIDE 0.9 % IV SOLN
1.0000 g | Freq: Once | INTRAVENOUS | Status: AC
  Administered 2023-12-16: 1 g via INTRAVENOUS
  Filled 2023-12-16: qty 10

## 2023-12-16 MED ORDER — CEFADROXIL 500 MG PO CAPS
500.0000 mg | ORAL_CAPSULE | Freq: Two times a day (BID) | ORAL | 0 refills | Status: AC
  Filled 2023-12-16: qty 10, 5d supply, fill #0

## 2023-12-16 MED ORDER — ALPRAZOLAM 0.5 MG PO TABS
1.0000 mg | ORAL_TABLET | Freq: Three times a day (TID) | ORAL | Status: DC
  Administered 2023-12-16 (×2): 1 mg via ORAL
  Filled 2023-12-16: qty 4
  Filled 2023-12-16: qty 2

## 2023-12-16 MED ORDER — SODIUM CHLORIDE 0.9 % IV BOLUS
500.0000 mL | Freq: Once | INTRAVENOUS | Status: AC
  Administered 2023-12-16: 500 mL via INTRAVENOUS

## 2023-12-16 MED ORDER — FENTANYL CITRATE PF 50 MCG/ML IJ SOSY
50.0000 ug | PREFILLED_SYRINGE | Freq: Once | INTRAMUSCULAR | Status: AC
  Administered 2023-12-16: 50 ug via INTRAVENOUS
  Filled 2023-12-16: qty 1

## 2023-12-16 MED ORDER — OXYCODONE HCL 5 MG PO TABS
10.0000 mg | ORAL_TABLET | Freq: Four times a day (QID) | ORAL | Status: DC | PRN
  Administered 2023-12-16 (×2): 10 mg via ORAL
  Filled 2023-12-16 (×2): qty 2

## 2023-12-16 MED ORDER — DIPHENHYDRAMINE HCL 25 MG PO CAPS
50.0000 mg | ORAL_CAPSULE | Freq: Once | ORAL | Status: AC
Start: 2023-12-16 — End: 2023-12-16
  Administered 2023-12-16: 50 mg via ORAL
  Filled 2023-12-16: qty 2

## 2023-12-16 MED ORDER — DOXYCYCLINE HYCLATE 100 MG PO TABS
100.0000 mg | ORAL_TABLET | Freq: Once | ORAL | Status: AC
  Administered 2023-12-16: 100 mg via ORAL
  Filled 2023-12-16: qty 1

## 2023-12-16 MED ORDER — SILDENAFIL CITRATE 20 MG PO TABS
20.0000 mg | ORAL_TABLET | Freq: Three times a day (TID) | ORAL | Status: DC
  Administered 2023-12-16 (×2): 20 mg via ORAL
  Filled 2023-12-16 (×3): qty 1

## 2023-12-16 MED ORDER — BUPRENORPHINE HCL 2 MG SL SUBL: 4.0000 mg | SUBLINGUAL_TABLET | Freq: Every day | SUBLINGUAL | Status: DC

## 2023-12-16 MED ORDER — PANTOPRAZOLE SODIUM 40 MG PO TBEC
40.0000 mg | DELAYED_RELEASE_TABLET | Freq: Two times a day (BID) | ORAL | Status: DC
Start: 2023-12-16 — End: 2023-12-16
  Administered 2023-12-16: 40 mg via ORAL
  Filled 2023-12-16: qty 1

## 2023-12-16 MED ORDER — DOXYCYCLINE HYCLATE 100 MG PO TABS
100.0000 mg | ORAL_TABLET | Freq: Two times a day (BID) | ORAL | Status: DC
  Administered 2023-12-16: 100 mg via ORAL
  Filled 2023-12-16: qty 1

## 2023-12-16 MED ORDER — APIXABAN 5 MG PO TABS
5.0000 mg | ORAL_TABLET | Freq: Two times a day (BID) | ORAL | Status: DC
  Administered 2023-12-16: 5 mg via ORAL
  Filled 2023-12-16: qty 1

## 2023-12-16 MED ORDER — SEMAGLUTIDE (2 MG/DOSE) 8 MG/3ML ~~LOC~~ SOPN: 2.0000 mg | PEN_INJECTOR | SUBCUTANEOUS | Status: DC

## 2023-12-16 MED ORDER — METHYLPREDNISOLONE SODIUM SUCC 40 MG IJ SOLR
40.0000 mg | Freq: Once | INTRAMUSCULAR | Status: AC
  Administered 2023-12-16: 40 mg via INTRAVENOUS
  Filled 2023-12-16: qty 1

## 2023-12-16 MED ORDER — DIPHENHYDRAMINE HCL 50 MG/ML IJ SOLN
50.0000 mg | Freq: Once | INTRAMUSCULAR | Status: AC
  Filled 2023-12-16: qty 1

## 2023-12-16 MED ORDER — OXYCODONE-ACETAMINOPHEN 10-325 MG PO TABS
1.0000 | ORAL_TABLET | Freq: Four times a day (QID) | ORAL | 0 refills | Status: AC | PRN
  Filled 2023-12-16: qty 4, 1d supply, fill #0

## 2023-12-16 NOTE — ED Notes (Signed)
Md messaged about pain meds, dr Imogene Burn states no more at this time to be given.

## 2023-12-16 NOTE — Assessment & Plan Note (Signed)
12-16-2023 stable.

## 2023-12-16 NOTE — Subjective & Objective (Addendum)
Pt seen and examined. Met with pt and dtr syrine and kalif at bedside.  I have reviewed her CT chest today and from 05-2022. She has a chronic right lower lobe scar with bronchiectasis.  Discussed that I don't think pt has CAP. Maybe some exacerbation of bronchiectasis but no true pneumonia.  Pt asking for more pain meds. No indications for IV opiates. Pt missed appointment for pain clinic since she has been in ER/hospital. Pt asking to go home.  Pt stable on her baseline 2-3 L/min O2.  Pt relates her history from before she broke her right leg in October 2024 until yesterday when she had SOB after straining/struggling to have a BM in the bathroom.

## 2023-12-16 NOTE — Assessment & Plan Note (Addendum)
12-16-2023 I don't she has CAP. Pneumonia has been ruled out.  Maybe some bronchiectasis. Will have family hold on abx unless she develops fever, productive sputum. Will Rx duricef and doxycycline to take home in case she develops symptoms this week.

## 2023-12-16 NOTE — Assessment & Plan Note (Addendum)
12-16-2023 has long discussion with pt and dtrs(syrine and kalif). Pt has been deconditioned even before she broke her right leg in October 2024. Dtr state that pt sometimes refuses to participate with home PT. Advised that pt needs to continue with home PT. Along with doing the exercises even when PT is not present. Pt would benefit from outpatient PT when she graduates from home PT.

## 2023-12-16 NOTE — ED Notes (Signed)
Teresa Wiley [phone] would like an update asap

## 2023-12-16 NOTE — Assessment & Plan Note (Signed)
12-16-2023 continue with 2-3 L/min of O2.

## 2023-12-16 NOTE — Hospital Course (Signed)
HPI: Teresa Wiley is a 68 y.o. female with medical history significant of  chronic hypoxic hypercapnic respiratory failure on 2 L nasal cannula at baseline, HTN, pulmonary hypertension, bronchomalacia, history of PE, DM2, chronic pain syndrome, ASD s/p percutaneous closure 08/2022, right leg fracture s/p ORIF,  who presents to ED BIB EMSwith complaint of acute onset of crushing chest pain  hours  PTA. IN the field patient also has complaints of generalized weakness , and vision changes.  Vitals notable for hr 140 and BP 90/50. Patient was tx with ASA 324 by ems. Patient currently noted vision changes as resolved. She states he has had similar episodes of weakness over the last few months but his episode has been the most severe.   Significant Events: Admitted 12/15/2023 for CAP   Significant Labs: Wbc 12.6, hgb 11.5,BNP 48.5 Na 140, K 4.1, cr 1.16 ( 0.79) CE3,3 Lactic 1.5  Significant Imaging Studies: CTPA  Negative for acute pulmonary embolism. 2. Peribronchovascular  ground-glass opacities with mild bronchiolectasis throughout the right lung, likely infectious/inflammatory.  Antibiotic Therapy: Anti-infectives (From admission, onward)    Start     Dose/Rate Route Frequency Ordered Stop   12/16/23 2200  cefadroxil (DURICEF) capsule 500 mg        500 mg Oral 2 times daily 12/16/23 1547     12/16/23 1000  doxycycline (VIBRA-TABS) tablet 100 mg        100 mg Oral Every 12 hours 12/16/23 0640     12/16/23 0115  cefTRIAXone (ROCEPHIN) 1 g in sodium chloride 0.9 % 100 mL IVPB        1 g 200 mL/hr over 30 Minutes Intravenous  Once 12/16/23 0109 12/16/23 0210   12/16/23 0115  doxycycline (VIBRA-TABS) tablet 100 mg        100 mg Oral  Once 12/16/23 0110 12/16/23 0234   12/16/23 0000  cefadroxil (DURICEF) 500 MG capsule        500 mg Oral 2 times daily 12/16/23 1654 12/21/23 2359   12/16/23 0000  doxycycline (VIBRA-TABS) 100 MG tablet        100 mg Oral Every 12 hours 12/16/23 1654  12/21/23 2359       Procedures:   Consultants:
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
## 2023-12-16 NOTE — Assessment & Plan Note (Signed)
12-16-2023 pt very anxious that she missed her pain clinic appointment today for refills on her chronic percocet. Discussed with pt and dtrs(syrine, kalif). Pain clinic has a walk-in clinic. I agreed to provide pt Rx for 4 tablets of percocet 10 mg so she would have enough to get her to pain clinic appointment tomorrow.

## 2023-12-16 NOTE — Progress Notes (Signed)
Patient on 2l  per patient and family is ok to travel home without O2  family will get O2 from house before patient exits car   Patient family just requested that O2 be on patient to travel down to car for discharge.  Then can be removed

## 2023-12-16 NOTE — Progress Notes (Signed)
Patient discharging

## 2023-12-16 NOTE — Discharge Summary (Signed)
[facility] Physician Discharge Summary   Patient name: Teresa Wiley  Admit date:     12/15/2023  Discharge date: 12/16/2023  Attending Physician: Lurline Del [[id number]]  Discharge Physician: Carollee Herter   PCP: Maye Hides, PA  Admitted From: Home  Disposition:  Home  Recommendations for Outpatient Follow-up:  Follow up with PCP in 1-2 weeks Follow up with pain clinic in AM. Please follow up on the following pending results: blood cultures  Home Health:No Equipment/Devices: Oxygen 2-3 L/min: chronically  Discharge Condition:Stable CODE STATUS:FULL Diet recommendation: Heart Healthy Fluid Restriction: None  Hospital Summary: HPI: Teresa Wiley is a 68 y.o. female with medical history significant of  chronic hypoxic hypercapnic respiratory failure on 2 L nasal cannula at baseline, HTN, pulmonary hypertension, bronchomalacia, history of PE, DM2, chronic pain syndrome, ASD s/p percutaneous closure 08/2022, right leg fracture s/p ORIF,  who presents to ED BIB EMSwith complaint of acute onset of crushing chest pain  hours  PTA. IN the field patient also has complaints of generalized weakness , and vision changes.  Vitals notable for hr 140 and BP 90/50. Patient was tx with ASA 324 by ems. Patient currently noted vision changes as resolved. She states he has had similar episodes of weakness over the last few months but his episode has been the most severe.   Significant Events: Admitted 12/15/2023 for CAP   Significant Labs: Wbc 12.6, hgb 11.5,BNP 48.5 Na 140, K 4.1, cr 1.16 ( 0.79) CE3,3 Lactic 1.5  Significant Imaging Studies: CTPA  Negative for acute pulmonary embolism. 2. Peribronchovascular  ground-glass opacities with mild bronchiolectasis throughout the right lung, likely infectious/inflammatory.  Antibiotic Therapy: Anti-infectives (From admission, onward)    Start     Dose/Rate Route Frequency Ordered Stop   12/16/23 2200  cefadroxil  (DURICEF) capsule 500 mg        500 mg Oral 2 times daily 12/16/23 1547     12/16/23 1000  doxycycline (VIBRA-TABS) tablet 100 mg        100 mg Oral Every 12 hours 12/16/23 0640     12/16/23 0115  cefTRIAXone (ROCEPHIN) 1 g in sodium chloride 0.9 % 100 mL IVPB        1 g 200 mL/hr over 30 Minutes Intravenous  Once 12/16/23 0109 12/16/23 0210   12/16/23 0115  doxycycline (VIBRA-TABS) tablet 100 mg        100 mg Oral  Once 12/16/23 0110 12/16/23 0234   12/16/23 0000  cefadroxil (DURICEF) 500 MG capsule        500 mg Oral 2 times daily 12/16/23 1654 12/21/23 2359   12/16/23 0000  doxycycline (VIBRA-TABS) 100 MG tablet        100 mg Oral Every 12 hours 12/16/23 1654 12/21/23 2359       Procedures:   Consultants:    Hospital Course by Problem: * Bronchiectasis (HCC) - chronic right RLL 12-16-2023 I don't she has CAP. Pneumonia has been ruled out.  Maybe some bronchiectasis. Will have family hold on abx unless she develops fever, productive sputum. Will Rx duricef and doxycycline to take home in case she develops symptoms this week.  Physical deconditioning 12-16-2023 has long discussion with pt and dtrs(syrine and kalif). Pt has been deconditioned even before she broke her right leg in October 2024. Dtr state that pt sometimes refuses to participate with home PT. Advised that pt needs to continue with home PT. Along with doing the exercises even when PT is not present. Pt  would benefit from outpatient PT when she graduates from home PT.  Anxiety 12-16-2023 has long discussion with pt and dtrs(syrine and kalif). Pt with lots of anxiety. She would benefit from outpatient mental health treatment and referral by PCP.  Chronic hypoxic respiratory failure (HCC) 12-16-2023 continue with 2-3 L/min of O2.  Chronic pain syndrome 12-16-2023 pt very anxious that she missed her pain clinic appointment today for refills on her chronic percocet. Discussed with pt and dtrs(syrine, kalif). Pain  clinic has a walk-in clinic. I agreed to provide pt Rx for 4 tablets of percocet 10 mg so she would have enough to get her to pain clinic appointment tomorrow.  Essential hypertension 12-16-2023 stable.    Discharge Diagnoses:  Principal Problem:   Bronchiectasis (HCC) - chronic right RLL Active Problems:   Anxiety   Physical deconditioning   Essential hypertension   Chronic pain syndrome   Chronic hypoxic respiratory failure [facility])   Discharge Instructions  Discharge Instructions     Call MD for:  difficulty breathing, headache or visual disturbances   Complete by: As directed    Call MD for:  extreme fatigue   Complete by: As directed    Call MD for:  hives   Complete by: As directed    Call MD for:  persistant dizziness or light-headedness   Complete by: As directed    Call MD for:  persistant nausea and vomiting   Complete by: As directed    Call MD for:  redness, tenderness, or signs of infection (pain, swelling, redness, odor or green/yellow discharge around incision site)   Complete by: As directed    Call MD for:  severe uncontrolled pain   Complete by: As directed    Call MD for:  temperature >100.4   Complete by: As directed    Diet - low sodium heart healthy   Complete by: As directed    Discharge instructions   Complete by: As directed    1. Follow up with primary care provider in 1 week 2. Follow up with pain clinic tomorrow.   Increase activity slowly   Complete by: As directed       Allergies as of 12/16/2023       Reactions   Belbuca [buprenorphine Hcl] Hives   E.e.s. [erythromycin] Anaphylaxis, Nausea And Vomiting   Iodine Hives, Swelling, Other (See Comments)   Throat swelling   Shellfish Allergy Anaphylaxis, Hives   Latex    Powder in the latex and balloons.   Pork-derived Products    religious   Other Rash   Electrodes stickers        Medication List     TAKE these medications    ALPRAZolam 1 MG tablet Commonly known as:  XANAX Take 1 mg by mouth 3 (three) times daily.   amoxicillin 500 MG tablet Commonly known as: AMOXIL Take 4 tablets (2,000 mg total) by mouth as directed. 1 hour prior to dental work including cleanings   apixaban 2.5 MG Tabs tablet Commonly known as: ELIQUIS Take 1 tablet (2.5 mg total) by mouth 2 (two) times daily. What changed: how much to take   atorvastatin 20 MG tablet Commonly known as: LIPITOR Take 20 mg by mouth daily.   buprenorphine 7.5 MCG/HR Commonly known as: BUTRANS Place 1 patch onto the skin once a week.   carvedilol 6.25 MG tablet Commonly known as: COREG TAKE 1 TABLET(6.25 MG) BY MOUTH TWICE DAILY WITH A MEAL   cefadroxil 500 MG capsule Commonly  known as: DURICEF Take 1 capsule (500 mg total) by mouth 2 (two) times daily for 5 days.   cyclobenzaprine 10 MG tablet Commonly known as: FLEXERIL Take 10 mg by mouth 3 (three) times daily as needed for muscle spasms.   diphenhydrAMINE 25 MG tablet Commonly known as: BENADRYL Take 100 mg by mouth at bedtime as needed for sleep.   doxycycline 100 MG tablet Commonly known as: VIBRA-TABS Take 1 tablet (100 mg total) by mouth every 12 (twelve) hours for 5 days.   furosemide 40 MG tablet Commonly known as: LASIX Take 40 mg by mouth daily.   gabapentin 600 MG tablet Commonly known as: NEURONTIN Take 600 mg by mouth 3 (three) times daily.   oxyCODONE-acetaminophen 10-325 MG tablet Commonly known as: PERCOCET Take 1 tablet by mouth 4 (four) times daily as needed for up to 1 day.   OXYGEN Inhale 2 L into the lungs continuous.   Ozempic (2 MG/DOSE) 8 MG/3ML Sopn Generic drug: Semaglutide (2 MG/DOSE) Inject 2 mg into the skin every Sunday.   pantoprazole 40 MG tablet Commonly known as: PROTONIX Take 40 mg by mouth 2 (two) times daily.   sildenafil 20 MG tablet Commonly known as: REVATIO Take 1 tablet (20 mg total) by mouth 3 (three) times daily.   tiZANidine 4 MG tablet Commonly known as:  Zanaflex Take 1 tablet (4 mg total) by mouth every 6 (six) hours as needed for muscle spasms. What changed: when to take this   Vitamin D (Ergocalciferol) 1.25 MG (50000 UNIT) Caps capsule Commonly known as: DRISDOL Take 50,000 Units by mouth once a week.   Voltaren Arthritis Pain 1 % Gel Generic drug: diclofenac Sodium Apply 2 g topically in the morning and at bedtime.        Allergies  Allergen Reactions   Belbuca [Buprenorphine Hcl] Hives   E.E.S. [Erythromycin] Anaphylaxis and Nausea And Vomiting   Iodine Hives, Swelling and Other (See Comments)    Throat swelling    Shellfish Allergy Anaphylaxis and Hives   Latex     Powder in the latex and balloons.   Pork-Derived Products     religious   Other Rash    Electrodes stickers    Discharge Exam: Vitals:   12/16/23 1510 12/16/23 1511  BP: 115/76 115/76  Pulse: 88 87  Resp: 18 18  Temp: 98.4 F (36.9 C) 98.4 F (36.9 C)  SpO2: 100% 99%    Physical Exam Vitals and nursing note reviewed.  Constitutional:      Appearance: She is obese.  HENT:     Head: Normocephalic and atraumatic.  Pulmonary:     Effort: Pulmonary effort is normal. No respiratory distress.  Skin:    General: Skin is warm and dry.     Capillary Refill: Capillary refill takes less than 2 seconds.  Neurological:     General: No focal deficit present.     Mental Status: She is alert.     The results of significant diagnostics from this hospitalization (including imaging, microbiology, ancillary and laboratory) are listed below for reference.    Microbiology: Recent Results (from the past 240 hours)  Culture, blood (routine x 2)     Status: None (Preliminary result)   Collection Time: 12/16/23 12:12 AM   Specimen: BLOOD  Result Value Ref Range Status   Specimen Description BLOOD LEFT ANTECUBITAL  Final   Special Requests   Final    BOTTLES DRAWN AEROBIC AND ANAEROBIC Blood Culture adequate volume  Culture   Final    NO GROWTH < 12  HOURS Performed at [facility], [address] [place], [place] [address]    Report Status PENDING  Incomplete  Culture, blood (routine x 2)     Status: None (Preliminary result)   Collection Time: 12/16/23  2:02 AM   Specimen: BLOOD  Result Value Ref Range Status   Specimen Description BLOOD BLOOD RIGHT ARM  Final   Special Requests   Final    BOTTLES DRAWN AEROBIC AND ANAEROBIC Blood Culture adequate volume   Culture   Final    NO GROWTH < 12 HOURS Performed at [facility], [address], [place], [place] [address]    Report Status PENDING  Incomplete  Resp panel by RT-PCR (RSV, Flu A&B, Covid) Anterior Nasal Swab     Status: None   Collection Time: 12/16/23  3:14 AM   Specimen: Anterior Nasal Swab  Result Value Ref Range Status   SARS Coronavirus 2 by RT PCR NEGATIVE NEGATIVE Final   Influenza A by PCR NEGATIVE NEGATIVE Final   Influenza B by PCR NEGATIVE NEGATIVE Final    Comment: (NOTE) The Xpert Xpress SARS-CoV-2/FLU/RSV plus assay is intended as an aid in the diagnosis of influenza from Nasopharyngeal swab specimens and should not be used as a sole basis for treatment. Nasal washings and aspirates are unacceptable for Xpert Xpress SARS-CoV-2/FLU/RSV testing.  Fact Sheet for Patients: BloggerCourse.com  Fact Sheet for Healthcare Providers: SeriousBroker.it  This test is not yet approved or cleared by the [place] FDA and has been authorized for detection and/or diagnosis of SARS-CoV-2 by FDA under an Emergency Use Authorization (EUA). This EUA will remain in effect (meaning this test can be used) for the duration of the COVID-19 declaration under Section 564(b)(1) of the Act, 21 U.S.C. section 360bbb-3(b)(1), unless the authorization is terminated or revoked.     Resp Syncytial Virus by PCR NEGATIVE NEGATIVE Final    Comment: (NOTE) Fact Sheet for  Patients: BloggerCourse.com  Fact Sheet for Healthcare Providers: SeriousBroker.it  This test is not yet approved or cleared by the [place] FDA and has been authorized for detection and/or diagnosis of SARS-CoV-2 by FDA under an Emergency Use Authorization (EUA). This EUA will remain in effect (meaning this test can be used) for the duration of the COVID-19 declaration under Section 564(b)(1) of the Act, 21 U.S.C. section 360bbb-3(b)(1), unless the authorization is terminated or revoked.  Performed at [facility], [address] [place], [place] [address]      Labs: BNP (last 3 results) Recent Labs    08/23/23 0002 12/16/23 0012  BNP 71.8 48.5   Basic Metabolic Panel: Recent Labs  Lab 12/16/23 0012 12/16/23 0024  NA 140 142  K 4.1 4.1  CL 99 98  CO2 31  --   GLUCOSE 144* 142*  BUN 8 10  CREATININE 1.16* 1.30*  CALCIUM 8.8*  --    Liver Function Tests: Recent Labs  Lab 12/16/23 0012  AST 15  ALT 8  ALKPHOS 101  BILITOT 0.7  PROT 7.4  ALBUMIN 3.2*   Recent Labs  Lab 12/16/23 0012  LIPASE 28    CBC: Recent Labs  Lab 12/16/23 0012 12/16/23 0024  WBC 12.6*  --   NEUTROABS 9.5*  --   HGB 11.5* 13.3  HCT 40.0 39.0  MCV 89.5  --   PLT 351  --    BNP: Recent Labs  Lab  12/16/23 0012  BNP 48.5   Sepsis Labs Recent Labs  Lab 12/16/23 0012  WBC 12.6*   Microbiology Recent Results (from the past 240 hours)  Culture, blood (routine x 2)     Status: None (Preliminary result)   Collection Time: 12/16/23 12:12 AM   Specimen: BLOOD  Result Value Ref Range Status   Specimen Description BLOOD LEFT ANTECUBITAL  Final   Special Requests   Final    BOTTLES DRAWN AEROBIC AND ANAEROBIC Blood Culture adequate volume   Culture   Final    NO GROWTH < 12 HOURS Performed at [facility], [address], [place], [place] [address]    Report Status PENDING  Incomplete  Culture, blood  (routine x 2)     Status: None (Preliminary result)   Collection Time: 12/16/23  2:02 AM   Specimen: BLOOD  Result Value Ref Range Status   Specimen Description BLOOD BLOOD RIGHT ARM  Final   Special Requests   Final    BOTTLES DRAWN AEROBIC AND ANAEROBIC Blood Culture adequate volume   Culture   Final    NO GROWTH < 12 HOURS Performed at [facility], [address], [place], [place] [address]    Report Status PENDING  Incomplete  Resp panel by RT-PCR (RSV, Flu A&B, Covid) Anterior Nasal Swab     Status: None   Collection Time: 12/16/23  3:14 AM   Specimen: Anterior Nasal Swab  Result Value Ref Range Status   SARS Coronavirus 2 by RT PCR NEGATIVE NEGATIVE Final   Influenza A by PCR NEGATIVE NEGATIVE Final   Influenza B by PCR NEGATIVE NEGATIVE Final    Comment: (NOTE) The Xpert Xpress SARS-CoV-2/FLU/RSV plus assay is intended as an aid in the diagnosis of influenza from Nasopharyngeal swab specimens and should not be used as a sole basis for treatment. Nasal washings and aspirates are unacceptable for Xpert Xpress SARS-CoV-2/FLU/RSV testing.  Fact Sheet for Patients: BloggerCourse.com  Fact Sheet for Healthcare Providers: SeriousBroker.it  This test is not yet approved or cleared by the [place] FDA and has been authorized for detection and/or diagnosis of SARS-CoV-2 by FDA under an Emergency Use Authorization (EUA). This EUA will remain in effect (meaning this test can be used) for the duration of the COVID-19 declaration under Section 564(b)(1) of the Act, 21 U.S.C. section 360bbb-3(b)(1), unless the authorization is terminated or revoked.     Resp Syncytial Virus by PCR NEGATIVE NEGATIVE Final    Comment: (NOTE) Fact Sheet for Patients: BloggerCourse.com  Fact Sheet for Healthcare Providers: SeriousBroker.it  This test is not yet approved or cleared by the  [place] FDA and has been authorized for detection and/or diagnosis of SARS-CoV-2 by FDA under an Emergency Use Authorization (EUA). This EUA will remain in effect (meaning this test can be used) for the duration of the COVID-19 declaration under Section 564(b)(1) of the Act, 21 U.S.C. section 360bbb-3(b)(1), unless the authorization is terminated or revoked.  Performed at [facility], [address], [place], [place] [address]     Procedures/Studies: CT Angio Chest PE W/Cm &/Or Wo Cm Result Date: 12/16/2023 CLINICAL DATA:  PE suspected. Vision changes, crushing chest pain, generalized weakness, tachycardia. EXAM: CT ANGIOGRAPHY CHEST WITH CONTRAST TECHNIQUE: Multidetector CT imaging of the chest was performed using the standard protocol during bolus administration of intravenous contrast. Multiplanar CT image reconstructions and MIPs were obtained to evaluate the vascular anatomy. RADIATION DOSE REDUCTION: This exam was performed  according to the departmental dose-optimization program which includes automated exposure control, adjustment of the mA and/or kV according to patient size and/or use of iterative reconstruction technique. CONTRAST:  75mL OMNIPAQUE IOHEXOL 350 MG/ML SOLN COMPARISON:  Same day chest radiograph and CT 06/24/2022 FINDINGS: Cardiovascular: Negative for acute pulmonary embolism. Normal heart size. No pericardial effusion. Normal caliber thoracic aorta. Mild aortic atherosclerotic calcification. Mediastinum/Nodes: Small hiatal hernia. Trachea is unremarkable. Shotty mediastinal lymph nodes are likely reactive. Lungs/Pleura: Peribronchovascular ground-glass opacities with mild bronchiolectasis throughout the right lung. Linear atelectasis/scarring in the lingula and left lower lobe. No pleural effusion or pneumothorax. Upper Abdomen: No acute abnormality. Musculoskeletal: No acute fracture. Review of the MIP images confirms the above findings. IMPRESSION: 1. Negative  for acute pulmonary embolism. 2. Peribronchovascular ground-glass opacities with mild bronchiolectasis throughout the right lung, likely infectious/inflammatory. Aortic Atherosclerosis (ICD10-I70.0). Electronically Signed   By: Minerva Fester M.D.   On: 12/16/2023 01:02   DG Chest Port 1 View Result Date: 12/16/2023 CLINICAL DATA:  Chest pain EXAM: PORTABLE CHEST 1 VIEW COMPARISON:  09/25/2023 FINDINGS: The patient is mildly rotated on this examination. Lung volumes are small. Small bilateral pleural effusions are present. Mild bibasilar atelectasis or infiltrate. No pneumothorax. Atrial septal occluder device is in place. Cardiac size is mildly enlarged. No acute bone abnormality. IMPRESSION: 1. Small bilateral pleural effusions. Mild bibasilar atelectasis or infiltrate. 2. Mild cardiomegaly. Electronically Signed   By: Helyn Numbers M.D.   On: 12/16/2023 00:11    Time coordinating discharge: 60 mins  SIGNED:  Carollee Herter, DO [facility] 12/16/23, 5:11 PM

## 2023-12-16 NOTE — Assessment & Plan Note (Signed)
12-16-2023 has long discussion with pt and dtrs(syrine and kalif). Pt with lots of anxiety. She would benefit from outpatient mental health treatment and referral by PCP.

## 2023-12-16 NOTE — Progress Notes (Addendum)
PROGRESS NOTE    Jozalyn Walkenhorst  RUE:[id number] DOB: 1956-09-18 DOA: 12/15/2023 PCP: Maye Hides, PA  Subjective: Pt seen and examined. Met with pt and dtr syrine and kalif at bedside.  I have reviewed her CT chest today and from 05-2022. She has a chronic right lower lobe scar with bronchiectasis.  Discussed that I don't think pt has CAP. Maybe some exacerbation of bronchiectasis but no true pneumonia.  Pt asking for more pain meds. No indications for IV opiates. Pt missed appointment for pain clinic since she has been in ER/hospital. Pt asking to go home.  Pt stable on her baseline 2-3 L/min O2.  Pt relates her history from before she broke her right leg in October 2024 until yesterday when she had SOB after straining/struggling to have a BM in the bathroom.   Hospital Course: HPI: Shavonna Eldredge is a 68 y.o. female with medical history significant of  chronic hypoxic hypercapnic respiratory failure on 2 L nasal cannula at baseline, HTN, pulmonary hypertension, bronchomalacia, history of PE, DM2, chronic pain syndrome, ASD s/p percutaneous closure 08/2022, right leg fracture s/p ORIF,  who presents to ED BIB EMSwith complaint of acute onset of crushing chest pain  hours  PTA. IN the field patient also has complaints of generalized weakness , and vision changes.  Vitals notable for hr 140 and BP 90/50. Patient was tx with ASA 324 by ems. Patient currently noted vision changes as resolved. She states he has had similar episodes of weakness over the last few months but his episode has been the most severe.   Significant Events: Admitted 12/15/2023 for CAP   Significant Labs: Wbc 12.6, hgb 11.5,BNP 48.5 Na 140, K 4.1, cr 1.16 ( 0.79) CE3,3 Lactic 1.5  Significant Imaging Studies: CTPA  Negative for acute pulmonary embolism. 2. Peribronchovascular  ground-glass opacities with mild bronchiolectasis throughout the right lung, likely infectious/inflammatory.  Antibiotic  Therapy: Anti-infectives (From admission, onward)    Start     Dose/Rate Route Frequency Ordered Stop   12/16/23 2200  cefadroxil (DURICEF) capsule 500 mg        500 mg Oral 2 times daily 12/16/23 1547     12/16/23 1000  doxycycline (VIBRA-TABS) tablet 100 mg        100 mg Oral Every 12 hours 12/16/23 0640     12/16/23 0115  cefTRIAXone (ROCEPHIN) 1 g in sodium chloride 0.9 % 100 mL IVPB        1 g 200 mL/hr over 30 Minutes Intravenous  Once 12/16/23 0109 12/16/23 0210   12/16/23 0115  doxycycline (VIBRA-TABS) tablet 100 mg        100 mg Oral  Once 12/16/23 0110 12/16/23 0234   12/16/23 0000  cefadroxil (DURICEF) 500 MG capsule        500 mg Oral 2 times daily 12/16/23 1654 12/21/23 2359   12/16/23 0000  doxycycline (VIBRA-TABS) 100 MG tablet        100 mg Oral Every 12 hours 12/16/23 1654 12/21/23 2359       Procedures:   Consultants:     Assessment and Plan: * Bronchiectasis (HCC) - chronic right RLL 12-16-2023 I don't she has CAP. Maybe some bronchiectasis. Will have family hold on abx unless she develops fever, productive sputum. Will Rx duricef and doxycycline to take home in case she develops symptoms this week.  Physical deconditioning 12-16-2023 has long discussion with pt and dtrs(syrine and kalif). Pt has been deconditioned even before she broke her right  leg in October 2024. Dtr state that pt sometimes refuses to participate with home PT. Advised that pt needs to continue with home PT. Along with doing the exercises even when PT is not present. Pt would benefit from outpatient PT when she graduates from home PT.  Anxiety 12-16-2023 has long discussion with pt and dtrs(syrine and kalif). Pt with lots of anxiety. She would benefit from outpatient mental health treatment and referral by PCP.  Chronic hypoxic respiratory failure (HCC) 12-16-2023 continue with 2-3 L/min of O2.  Chronic pain syndrome 12-16-2023 pt very anxious that she missed her pain clinic appointment  today for refills on her chronic percocet. Discussed with pt and dtrs(syrine, kalif). Pain clinic has a walk-in clinic. I agreed to provide pt Rx for 4 tablets of percocet 10 mg so she would have enough to get her to pain clinic appointment tomorrow.  Essential hypertension 12-16-2023 stable.   DVT prophylaxis: SCDs Start: 12/16/23 0451 apixaban (ELIQUIS) tablet 5 mg     Code Status: Full Code Family Communication: discussed with pt and both dtr syrine and kalif Disposition Plan: return home Reason for continuing need for hospitalization: medically stable.  Objective: Vitals:   12/16/23 1215 12/16/23 1330 12/16/23 1510 12/16/23 1511  BP: 104/80 117/79 115/76 115/76  Pulse: (!) 117 91 88 87  Resp: 20 14 18 18   Temp:  98.4 F (36.9 C) 98.4 F (36.9 C) 98.4 F (36.9 C)  TempSrc:  Oral Oral Oral  SpO2: 100% 100% 100% 99%    Intake/Output Summary (Last 24 hours) at 12/16/2023 1711 Last data filed at 12/16/2023 0455 Gross per 24 hour  Intake 492.78 ml  Output --  Net 492.78 ml   There were no vitals filed for this visit.  Examination:  Physical Exam Vitals and nursing note reviewed.  Constitutional:      Appearance: She is obese.  HENT:     Head: Normocephalic and atraumatic.  Pulmonary:     Effort: Pulmonary effort is normal. No respiratory distress.  Skin:    General: Skin is warm and dry.     Capillary Refill: Capillary refill takes less than 2 seconds.  Neurological:     General: No focal deficit present.     Mental Status: She is alert.    Data Reviewed: I have personally reviewed following labs and imaging studies  CBC: Recent Labs  Lab 12/16/23 0012 12/16/23 0024  WBC 12.6*  --   NEUTROABS 9.5*  --   HGB 11.5* 13.3  HCT 40.0 39.0  MCV 89.5  --   PLT 351  --    Basic Metabolic Panel: Recent Labs  Lab 12/16/23 0012 12/16/23 0024  NA 140 142  K 4.1 4.1  CL 99 98  CO2 31  --   GLUCOSE 144* 142*  BUN 8 10  CREATININE 1.16* 1.30*  CALCIUM 8.8*   --    GFR: CrCl cannot be calculated (Unknown ideal weight.). Liver Function Tests: Recent Labs  Lab 12/16/23 0012  AST 15  ALT 8  ALKPHOS 101  BILITOT 0.7  PROT 7.4  ALBUMIN 3.2*   Recent Labs  Lab 12/16/23 0012  LIPASE 28   BNP (last 3 results) Recent Labs    08/23/23 0002 12/16/23 0012  BNP 71.8 48.5   Sepsis Labs: Recent Labs  Lab 12/16/23 0222  LATICACIDVEN 1.5    Recent Results (from the past 240 hours)  Culture, blood (routine x 2)     Status: None (Preliminary result)  Collection Time: 12/16/23 12:12 AM   Specimen: BLOOD  Result Value Ref Range Status   Specimen Description BLOOD LEFT ANTECUBITAL  Final   Special Requests   Final    BOTTLES DRAWN AEROBIC AND ANAEROBIC Blood Culture adequate volume   Culture   Final    NO GROWTH < 12 HOURS Performed at [facility], [address], [place], [place] [address]    Report Status PENDING  Incomplete  Culture, blood (routine x 2)     Status: None (Preliminary result)   Collection Time: 12/16/23  2:02 AM   Specimen: BLOOD  Result Value Ref Range Status   Specimen Description BLOOD BLOOD RIGHT ARM  Final   Special Requests   Final    BOTTLES DRAWN AEROBIC AND ANAEROBIC Blood Culture adequate volume   Culture   Final    NO GROWTH < 12 HOURS Performed at [facility], [address] [place], [place] [address]    Report Status PENDING  Incomplete  Resp panel by RT-PCR (RSV, Flu A&B, Covid) Anterior Nasal Swab     Status: None   Collection Time: 12/16/23  3:14 AM   Specimen: Anterior Nasal Swab  Result Value Ref Range Status   SARS Coronavirus 2 by RT PCR NEGATIVE NEGATIVE Final   Influenza A by PCR NEGATIVE NEGATIVE Final   Influenza B by PCR NEGATIVE NEGATIVE Final    Comment: (NOTE) The Xpert Xpress SARS-CoV-2/FLU/RSV plus assay is intended as an aid in the diagnosis of influenza from Nasopharyngeal swab specimens and should not be used as a sole basis for treatment. Nasal washings  and aspirates are unacceptable for Xpert Xpress SARS-CoV-2/FLU/RSV testing.  Fact Sheet for Patients: BloggerCourse.com  Fact Sheet for Healthcare Providers: SeriousBroker.it  This test is not yet approved or cleared by the [place] FDA and has been authorized for detection and/or diagnosis of SARS-CoV-2 by FDA under an Emergency Use Authorization (EUA). This EUA will remain in effect (meaning this test can be used) for the duration of the COVID-19 declaration under Section 564(b)(1) of the Act, 21 U.S.C. section 360bbb-3(b)(1), unless the authorization is terminated or revoked.     Resp Syncytial Virus by PCR NEGATIVE NEGATIVE Final    Comment: (NOTE) Fact Sheet for Patients: BloggerCourse.com  Fact Sheet for Healthcare Providers: SeriousBroker.it  This test is not yet approved or cleared by the [place] FDA and has been authorized for detection and/or diagnosis of SARS-CoV-2 by FDA under an Emergency Use Authorization (EUA). This EUA will remain in effect (meaning this test can be used) for the duration of the COVID-19 declaration under Section 564(b)(1) of the Act, 21 U.S.C. section 360bbb-3(b)(1), unless the authorization is terminated or revoked.  Performed at [facility], [address] [place], [place] [address]      Radiology Studies: CT Angio Chest PE W/Cm &/Or Wo Cm Result Date: 12/16/2023 CLINICAL DATA:  PE suspected. Vision changes, crushing chest pain, generalized weakness, tachycardia. EXAM: CT ANGIOGRAPHY CHEST WITH CONTRAST TECHNIQUE: Multidetector CT imaging of the chest was performed using the standard protocol during bolus administration of intravenous contrast. Multiplanar CT image reconstructions and MIPs were obtained to evaluate the vascular anatomy. RADIATION DOSE REDUCTION: This exam was performed according to the departmental  dose-optimization program which includes automated exposure control, adjustment of the mA and/or kV according to patient size and/or use of iterative reconstruction technique. CONTRAST:  75mL OMNIPAQUE IOHEXOL 350 MG/ML SOLN COMPARISON:  Same day chest radiograph and  CT 06/24/2022 FINDINGS: Cardiovascular: Negative for acute pulmonary embolism. Normal heart size. No pericardial effusion. Normal caliber thoracic aorta. Mild aortic atherosclerotic calcification. Mediastinum/Nodes: Small hiatal hernia. Trachea is unremarkable. Shotty mediastinal lymph nodes are likely reactive. Lungs/Pleura: Peribronchovascular ground-glass opacities with mild bronchiolectasis throughout the right lung. Linear atelectasis/scarring in the lingula and left lower lobe. No pleural effusion or pneumothorax. Upper Abdomen: No acute abnormality. Musculoskeletal: No acute fracture. Review of the MIP images confirms the above findings. IMPRESSION: 1. Negative for acute pulmonary embolism. 2. Peribronchovascular ground-glass opacities with mild bronchiolectasis throughout the right lung, likely infectious/inflammatory. Aortic Atherosclerosis (ICD10-I70.0). Electronically Signed   By: Minerva Fester M.D.   On: 12/16/2023 01:02   DG Chest Port 1 View Result Date: 12/16/2023 CLINICAL DATA:  Chest pain EXAM: PORTABLE CHEST 1 VIEW COMPARISON:  09/25/2023 FINDINGS: The patient is mildly rotated on this examination. Lung volumes are small. Small bilateral pleural effusions are present. Mild bibasilar atelectasis or infiltrate. No pneumothorax. Atrial septal occluder device is in place. Cardiac size is mildly enlarged. No acute bone abnormality. IMPRESSION: 1. Small bilateral pleural effusions. Mild bibasilar atelectasis or infiltrate. 2. Mild cardiomegaly. Electronically Signed   By: Helyn Numbers M.D.   On: 12/16/2023 00:11    Scheduled Meds:  ALPRAZolam  1 mg Oral TID   apixaban  5 mg Oral BID   atorvastatin  20 mg Oral Daily   carvedilol   6.25 mg Oral BID WC   cefadroxil  500 mg Oral BID   doxycycline  100 mg Oral Q12H   gabapentin  600 mg Oral TID   pantoprazole  40 mg Oral BID   predniSONE  40 mg Oral Q breakfast   sildenafil  20 mg Oral TID   Continuous Infusions:   LOS: 1 day   Time spent: 60 minutes  Carollee Herter, DO  [facility]  12/16/2023, 5:11 PM

## 2023-12-16 NOTE — Care Management (Signed)
Per [facility] liaison, patient is active w [facility] for PT OT

## 2023-12-17 ENCOUNTER — Telehealth (HOSPITAL_COMMUNITY): Payer: Self-pay | Admitting: Pharmacist

## 2023-12-17 LAB — BLOOD CULTURE ID PANEL (REFLEXED) - BCID2

## 2023-12-17 NOTE — Progress Notes (Signed)
Discussed blood culture results with Dr. Leafy Half this morning- Blood cultures are showing staph species in 1/4 bottles. This likely represents contamination. Patient was admitted with bronchiectasis and discharged with cefadroxil and doxycycline to take in case of renewed symptoms.   Tried to call patient for wellness check but phone when straight to voicemail. Left message for patient to call back for a check in.    Sharin Mons, PharmD, BCPS, BCIDP Infectious Diseases Clinical Pharmacist Phone: [phone] 12/17/2023 3:07 PM

## 2023-12-21 LAB — CULTURE, BLOOD (ROUTINE X 2)
Culture: NO GROWTH
Special Requests: ADEQUATE

## 2023-12-24 LAB — CULTURE, BLOOD (ROUTINE X 2): Special Requests: ADEQUATE

## 2023-12-29 ENCOUNTER — Emergency Department (HOSPITAL_COMMUNITY)
Admission: EM | Admit: 2023-12-29 | Discharge: 2023-12-29 | Disposition: A | Discharge status: Home / Self Care | Payer: Medicare Other | Attending: Emergency Medicine | Admitting: Emergency Medicine

## 2023-12-29 ENCOUNTER — Emergency Department (HOSPITAL_COMMUNITY): Payer: Medicare Other

## 2023-12-29 ENCOUNTER — Other Ambulatory Visit: Payer: Self-pay

## 2023-12-29 ENCOUNTER — Other Ambulatory Visit (HOSPITAL_COMMUNITY): Payer: Medicare Other

## 2023-12-29 DIAGNOSIS — J449 Chronic obstructive pulmonary disease, unspecified: Secondary | ICD-10-CM | POA: Insufficient documentation

## 2023-12-29 DIAGNOSIS — D649 Anemia, unspecified: Secondary | ICD-10-CM | POA: Diagnosis not present

## 2023-12-29 DIAGNOSIS — R4182 Altered mental status, unspecified: Secondary | ICD-10-CM | POA: Insufficient documentation

## 2023-12-29 DIAGNOSIS — I10 Essential (primary) hypertension: Secondary | ICD-10-CM | POA: Insufficient documentation

## 2023-12-29 DIAGNOSIS — Z20822 Contact with and (suspected) exposure to covid-19: Secondary | ICD-10-CM | POA: Insufficient documentation

## 2023-12-29 DIAGNOSIS — Z8673 Personal history of transient ischemic attack (TIA), and cerebral infarction without residual deficits: Secondary | ICD-10-CM | POA: Insufficient documentation

## 2023-12-29 DIAGNOSIS — R079 Chest pain, unspecified: Secondary | ICD-10-CM | POA: Diagnosis not present

## 2023-12-29 DIAGNOSIS — Z7901 Long term (current) use of anticoagulants: Secondary | ICD-10-CM | POA: Diagnosis not present

## 2023-12-29 DIAGNOSIS — Z91040 Latex allergy status: Secondary | ICD-10-CM | POA: Insufficient documentation

## 2023-12-29 DIAGNOSIS — Z79899 Other long term (current) drug therapy: Secondary | ICD-10-CM | POA: Insufficient documentation

## 2023-12-29 DIAGNOSIS — R0602 Shortness of breath: Secondary | ICD-10-CM | POA: Insufficient documentation

## 2023-12-29 DIAGNOSIS — E119 Type 2 diabetes mellitus without complications: Secondary | ICD-10-CM | POA: Insufficient documentation

## 2023-12-29 LAB — COMPREHENSIVE METABOLIC PANEL
ALT: 10 U/L (ref 0–44)
AST: 20 U/L (ref 15–41)
Albumin: 3.5 g/dL (ref 3.5–5.0)
Alkaline Phosphatase: 102 U/L (ref 38–126)
Anion gap: 14 (ref 5–15)
BUN: 10 mg/dL (ref 8–23)
CO2: 31 mmol/L (ref 22–32)
Calcium: 9.6 mg/dL (ref 8.9–10.3)
Chloride: 98 mmol/L (ref 98–111)
Creatinine, Ser: 1.11 mg/dL — ABNORMAL HIGH (ref 0.44–1.00)
GFR, Estimated: 55 mL/min — ABNORMAL LOW (ref >60)
Glucose, Bld: 91 mg/dL (ref 70–99)
Potassium: 4 mmol/L (ref 3.5–5.1)
Sodium: 143 mmol/L (ref 135–145)
Total Bilirubin: 0.7 mg/dL (ref 0.0–1.2)
Total Protein: 8.1 g/dL (ref 6.5–8.1)

## 2023-12-29 LAB — URINALYSIS, W/ REFLEX TO CULTURE (INFECTION SUSPECTED)
Bacteria, UA: NONE SEEN
Bilirubin Urine: NEGATIVE
Glucose, UA: NEGATIVE mg/dL
Hgb urine dipstick: NEGATIVE
Ketones, ur: NEGATIVE mg/dL
Leukocytes,Ua: NEGATIVE
Nitrite: NEGATIVE
Protein, ur: NEGATIVE mg/dL
Specific Gravity, Urine: 1.009 (ref 1.005–1.030)
pH: 7 (ref 5.0–8.0)

## 2023-12-29 LAB — CBC WITH DIFFERENTIAL/PLATELET
Abs Immature Granulocytes: 0.03 10*3/uL (ref 0.00–0.07)
Basophils Absolute: 0 10*3/uL (ref 0.0–0.1)
Basophils Relative: 0 %
Eosinophils Absolute: 0.3 10*3/uL (ref 0.0–0.5)
Eosinophils Relative: 4 %
HCT: 40.7 % (ref 36.0–46.0)
Hemoglobin: 11.8 g/dL — ABNORMAL LOW (ref 12.0–15.0)
Immature Granulocytes: 0 %
Lymphocytes Relative: 22 %
Lymphs Abs: 1.5 10*3/uL (ref 0.7–4.0)
MCH: 25.4 pg — ABNORMAL LOW (ref 26.0–34.0)
MCHC: 29 g/dL — ABNORMAL LOW (ref 30.0–36.0)
MCV: 87.7 fL (ref 80.0–100.0)
Monocytes Absolute: 0.5 10*3/uL (ref 0.1–1.0)
Monocytes Relative: 8 %
Neutro Abs: 4.5 10*3/uL (ref 1.7–7.7)
Neutrophils Relative %: 66 %
Platelets: 409 10*3/uL — ABNORMAL HIGH (ref 150–400)
RBC: 4.64 MIL/uL (ref 3.87–5.11)
RDW: 15.9 % — ABNORMAL HIGH (ref 11.5–15.5)
WBC: 6.9 10*3/uL (ref 4.0–10.5)
nRBC: 0 % (ref 0.0–0.2)

## 2023-12-29 LAB — RESP PANEL BY RT-PCR (RSV, FLU A&B, COVID)  RVPGX2
Influenza A by PCR: NEGATIVE
Influenza B by PCR: NEGATIVE
Resp Syncytial Virus by PCR: NEGATIVE
SARS Coronavirus 2 by RT PCR: NEGATIVE

## 2023-12-29 LAB — I-STAT VENOUS BLOOD GAS, ED
Acid-Base Excess: 9 mmol/L — ABNORMAL HIGH (ref 0.0–2.0)
Bicarbonate: 36.3 mmol/L — ABNORMAL HIGH (ref 20.0–28.0)
Calcium, Ion: 1.19 mmol/L (ref 1.15–1.40)
HCT: 37 % (ref 36.0–46.0)
Hemoglobin: 12.6 g/dL (ref 12.0–15.0)
O2 Saturation: 65 %
Potassium: 3.5 mmol/L (ref 3.5–5.1)
Sodium: 141 mmol/L (ref 135–145)
TCO2: 38 mmol/L — ABNORMAL HIGH (ref 22–32)
pCO2, Ven: 62.9 mm[Hg] — ABNORMAL HIGH (ref 44–60)
pH, Ven: 7.37 (ref 7.25–7.43)
pO2, Ven: 36 mm[Hg] (ref 32–45)

## 2023-12-29 LAB — RAPID URINE DRUG SCREEN, HOSP PERFORMED
Amphetamines: NOT DETECTED
Barbiturates: NOT DETECTED
Benzodiazepines: POSITIVE — AB
Cocaine: NOT DETECTED
Opiates: POSITIVE — AB
Tetrahydrocannabinol: NOT DETECTED

## 2023-12-29 LAB — TROPONIN I (HIGH SENSITIVITY)
Troponin I (High Sensitivity): 8 ng/L (ref <18)
Troponin I (High Sensitivity): 11 ng/L (ref <18)

## 2023-12-29 LAB — AMMONIA: Ammonia: 25 umol/L (ref 9–35)

## 2023-12-29 LAB — ACETAMINOPHEN LEVEL: Acetaminophen (Tylenol), Serum: 10 ug/mL (ref 10–30)

## 2023-12-29 LAB — ETHANOL: Alcohol, Ethyl (B): <10 mg/dL (ref <10)

## 2023-12-29 LAB — SALICYLATE LEVEL: Salicylate Lvl: <7 mg/dL — ABNORMAL LOW (ref 7.0–30.0)

## 2023-12-29 LAB — MAGNESIUM: Magnesium: 2 mg/dL (ref 1.7–2.4)

## 2023-12-29 MED ORDER — ASPIRIN 325 MG PO TBEC
325.0000 mg | DELAYED_RELEASE_TABLET | Freq: Once | ORAL | Status: AC
  Administered 2023-12-29: 325 mg via ORAL
  Filled 2023-12-29: qty 1

## 2023-12-29 MED ORDER — FENTANYL CITRATE PF 50 MCG/ML IJ SOSY
12.5000 ug | PREFILLED_SYRINGE | Freq: Once | INTRAMUSCULAR | Status: AC
  Administered 2023-12-29: 12.5 ug via INTRAVENOUS
  Filled 2023-12-29: qty 1

## 2023-12-29 MED ORDER — NITROGLYCERIN 2 % TD OINT
1.0000 [in_us] | TOPICAL_OINTMENT | Freq: Once | TRANSDERMAL | Status: AC
  Administered 2023-12-29: 1 [in_us] via TOPICAL
  Filled 2023-12-29: qty 1

## 2023-12-29 NOTE — Discharge Instructions (Signed)
It was a pleasure taking part in your care.  As we discussed, you workup is largely reassuring here.  Please continue taking all medication as prescribed.  Please follow-up with your PCP for further management and care.  If you have any return of symptoms or new or worsening symptoms please return to the ED for further management and care.
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
## 2023-12-29 NOTE — ED Provider Notes (Signed)
Physical Exam  BP 139/86 (BP Location: Left Arm)   Pulse 80   Temp 98.4 F (36.9 C) (Oral)   Resp (!) 31   Ht 5\' 9"  (1.753 m)   Wt 100.2 kg   SpO2 100%   BMI 32.64 kg/m   Physical Exam Vitals and nursing note reviewed.  Constitutional:      General: She is not in acute distress.    Appearance: She is well-developed.  HENT:     Head: Normocephalic and atraumatic.  Eyes:     Conjunctiva/sclera: Conjunctivae normal.  Cardiovascular:     Rate and Rhythm: Normal rate and regular rhythm.     Heart sounds: No murmur heard. Pulmonary:     Effort: Pulmonary effort is normal. No respiratory distress.     Breath sounds: Normal breath sounds.  Abdominal:     Palpations: Abdomen is soft.     Tenderness: There is no abdominal tenderness.  Musculoskeletal:        General: No swelling.     Cervical back: Neck supple.  Skin:    General: Skin is warm and dry.     Capillary Refill: Capillary refill takes less than 2 seconds.  Neurological:     Mental Status: She is alert and oriented to person, place, and time. Mental status is at baseline.     Comments: CN III through XII intact.  No pronator drift, no slurred speech, no facial droop.  No dysmetria.  Intact finger-nose and heel-to-shin.  Patient alert and orient x 4.  Alert to time, place, situation.  Psychiatric:        Mood and Affect: Mood normal.     Procedures  Procedures  ED Course / MDM   Clinical Course as of 12/29/23 1849  Mon Dec 29, 2023  1514 Concern for AMS, CP, SOB. Wife states her symptoms began  yesterday. Symptoms improving, alert and oriented, making spontaneous movements. Making nonsensical statements yesterday. If continuing to improve, she can go home. [CG]    Clinical Course User Index [CG] Al Decant, PA-C   Medical Decision Making Amount and/or Complexity of Data Reviewed Labs: ordered. Radiology: ordered.  Risk OTC drugs. Prescription drug management.   68 year old female signed out  to me at shift change pending CT head, laboratory evaluation and reassessment.  Please see HPI for further details.  In short, 68 year old female who began to have altered mental status beginning yesterday and into this morning.  Noted by patient partner.  Patient apparently has had improvement of mental status while she has been here in the department.  CT head of this patient is unremarkable.  Will add on rapid urine drug screen, VBG.  Rapid urine drug screen shows positive for opiates, benzodiazepines.  I-STAT VBG with a slightly increased pCO2 to 62.9, bicarb 36.3.  Patient workup otherwise unremarkable.  Had lengthy discussion with the patient and the patient partner at bedside.  Offered MRI of the patient brain to fully rule out any kind of an [place] underlying cause of patient AMS however the patient defers on this at this time.  Patient is requesting additional pain medication, 12.5 micrograms of fentanyl was ordered.  Patient was also seen by my attending Dr., Dr. Rodena Medin, who also offered MRI brain however patient and patient daughter at bedside defer on this at this time.  Patient we discharged over this time.  She will follow-up with her PCP.  Patient workup was otherwise unremarkable.   Al Decant, PA-C 12/29/23 1849  Wynetta Fines, MD 12/29/23 [phone]

## 2023-12-29 NOTE — ED Triage Notes (Addendum)
Patient presents via EMS from home for shortness of breath. Per EMS, patient wears 2L [place] at baseline and recently moved back to the upstairs of her house after having a surgery. Per EMS, patient has not been hypoxic for them on 2L [place]; 92% and above; clear lung sounds. Per EMS, seems confused and was saying that her phone has been giving her oxygen. Upon initial assessment, patient is disoriented to situation. Patient reports that she is here for tremors in her lungs and then states that she is here for a clog in her chest.

## 2024-01-06 ENCOUNTER — Other Ambulatory Visit (HOSPITAL_COMMUNITY): Payer: Self-pay

## 2024-01-06 DIAGNOSIS — I5032 Chronic diastolic (congestive) heart failure: Secondary | ICD-10-CM

## 2024-01-06 MED ORDER — CARVEDILOL 6.25 MG PO TABS: 6.2500 mg | ORAL_TABLET | Freq: Two times a day (BID) | ORAL | 0 refills | Status: DC

## 2024-01-07 ENCOUNTER — Other Ambulatory Visit (HOSPITAL_COMMUNITY): Payer: Self-pay

## 2024-01-07 DIAGNOSIS — I5032 Chronic diastolic (congestive) heart failure: Secondary | ICD-10-CM

## 2024-01-07 MED ORDER — CARVEDILOL 6.25 MG PO TABS: 6.2500 mg | ORAL_TABLET | Freq: Two times a day (BID) | ORAL | 0 refills | Status: DC

## 2024-01-23 ENCOUNTER — Ambulatory Visit: Payer: Medicare Other | Admitting: Adult Health

## 2024-01-23 ENCOUNTER — Encounter: Payer: Self-pay | Admitting: Adult Health

## 2024-01-31 ENCOUNTER — Observation Stay (HOSPITAL_COMMUNITY)
Admission: EM | Admit: 2024-01-31 | Discharge: 2024-02-01 | Disposition: A | Discharge status: Home / Self Care | Payer: Medicare (Managed Care) | Attending: Internal Medicine | Admitting: Internal Medicine

## 2024-01-31 ENCOUNTER — Emergency Department (HOSPITAL_COMMUNITY): Payer: Medicare (Managed Care)

## 2024-01-31 ENCOUNTER — Other Ambulatory Visit: Payer: Self-pay

## 2024-01-31 DIAGNOSIS — E46 Unspecified protein-calorie malnutrition: Secondary | ICD-10-CM | POA: Insufficient documentation

## 2024-01-31 DIAGNOSIS — E872 Acidosis, unspecified: Secondary | ICD-10-CM | POA: Insufficient documentation

## 2024-01-31 DIAGNOSIS — J9602 Acute respiratory failure with hypercapnia: Secondary | ICD-10-CM | POA: Insufficient documentation

## 2024-01-31 DIAGNOSIS — Z7901 Long term (current) use of anticoagulants: Secondary | ICD-10-CM | POA: Insufficient documentation

## 2024-01-31 DIAGNOSIS — K7581 Nonalcoholic steatohepatitis (NASH): Secondary | ICD-10-CM | POA: Diagnosis not present

## 2024-01-31 DIAGNOSIS — Z8775 Personal history of (corrected) congenital malformations of respiratory system: Secondary | ICD-10-CM | POA: Diagnosis not present

## 2024-01-31 DIAGNOSIS — Z87891 Personal history of nicotine dependence: Secondary | ICD-10-CM | POA: Insufficient documentation

## 2024-01-31 DIAGNOSIS — T40491A Poisoning by other synthetic narcotics, accidental (unintentional), initial encounter: Secondary | ICD-10-CM | POA: Insufficient documentation

## 2024-01-31 DIAGNOSIS — T402X1A Poisoning by other opioids, accidental (unintentional), initial encounter: Principal | ICD-10-CM | POA: Insufficient documentation

## 2024-01-31 DIAGNOSIS — I5032 Chronic diastolic (congestive) heart failure: Secondary | ICD-10-CM | POA: Insufficient documentation

## 2024-01-31 DIAGNOSIS — R0902 Hypoxemia: Secondary | ICD-10-CM | POA: Insufficient documentation

## 2024-01-31 DIAGNOSIS — Z86711 Personal history of pulmonary embolism: Secondary | ICD-10-CM | POA: Diagnosis not present

## 2024-01-31 DIAGNOSIS — Z79899 Other long term (current) drug therapy: Secondary | ICD-10-CM | POA: Insufficient documentation

## 2024-01-31 DIAGNOSIS — Z8673 Personal history of transient ischemic attack (TIA), and cerebral infarction without residual deficits: Secondary | ICD-10-CM | POA: Diagnosis not present

## 2024-01-31 DIAGNOSIS — I110 Hypertensive heart disease with heart failure: Secondary | ICD-10-CM | POA: Insufficient documentation

## 2024-01-31 DIAGNOSIS — F1120 Opioid dependence, uncomplicated: Secondary | ICD-10-CM | POA: Diagnosis not present

## 2024-01-31 DIAGNOSIS — J4489 Other specified chronic obstructive pulmonary disease: Secondary | ICD-10-CM | POA: Diagnosis not present

## 2024-01-31 DIAGNOSIS — E669 Obesity, unspecified: Secondary | ICD-10-CM | POA: Diagnosis not present

## 2024-01-31 DIAGNOSIS — I272 Pulmonary hypertension, unspecified: Secondary | ICD-10-CM | POA: Diagnosis not present

## 2024-01-31 DIAGNOSIS — G9340 Encephalopathy, unspecified: Principal | ICD-10-CM | POA: Insufficient documentation

## 2024-01-31 DIAGNOSIS — G8929 Other chronic pain: Secondary | ICD-10-CM | POA: Diagnosis not present

## 2024-01-31 DIAGNOSIS — R4182 Altered mental status, unspecified: Secondary | ICD-10-CM | POA: Diagnosis present

## 2024-01-31 DIAGNOSIS — J9601 Acute respiratory failure with hypoxia: Secondary | ICD-10-CM | POA: Insufficient documentation

## 2024-01-31 DIAGNOSIS — F419 Anxiety disorder, unspecified: Secondary | ICD-10-CM | POA: Diagnosis not present

## 2024-01-31 DIAGNOSIS — Z6832 Body mass index (BMI) 32.0-32.9, adult: Secondary | ICD-10-CM | POA: Diagnosis not present

## 2024-01-31 DIAGNOSIS — E039 Hypothyroidism, unspecified: Secondary | ICD-10-CM | POA: Insufficient documentation

## 2024-01-31 DIAGNOSIS — R0689 Other abnormalities of breathing: Secondary | ICD-10-CM | POA: Diagnosis present

## 2024-01-31 LAB — I-STAT VENOUS BLOOD GAS, ED
Acid-Base Excess: 8 mmol/L — ABNORMAL HIGH (ref 0.0–2.0)
Bicarbonate: 35.1 mmol/L — ABNORMAL HIGH (ref 20.0–28.0)
Calcium, Ion: 1.12 mmol/L — ABNORMAL LOW (ref 1.15–1.40)
HCT: 34 % — ABNORMAL LOW (ref 36.0–46.0)
Hemoglobin: 11.6 g/dL — ABNORMAL LOW (ref 12.0–15.0)
O2 Saturation: 89 %
Potassium: 3.5 mmol/L (ref 3.5–5.1)
Sodium: 142 mmol/L (ref 135–145)
TCO2: 37 mmol/L — ABNORMAL HIGH (ref 22–32)
pCO2, Ven: 60.7 mmHg — ABNORMAL HIGH (ref 44–60)
pH, Ven: 7.37 (ref 7.25–7.43)
pO2, Ven: 60 mmHg — ABNORMAL HIGH (ref 32–45)

## 2024-01-31 LAB — ETHANOL: Alcohol, Ethyl (B): <10 mg/dL (ref <10)

## 2024-01-31 LAB — I-STAT CHEM 8, ED
BUN: 10 mg/dL (ref 8–23)
Calcium, Ion: 1.11 mmol/L — ABNORMAL LOW (ref 1.15–1.40)
Chloride: 99 mmol/L (ref 98–111)
Creatinine, Ser: 1.1 mg/dL — ABNORMAL HIGH (ref 0.44–1.00)
Glucose, Bld: 97 mg/dL (ref 70–99)
HCT: 34 % — ABNORMAL LOW (ref 36.0–46.0)
Hemoglobin: 11.6 g/dL — ABNORMAL LOW (ref 12.0–15.0)
Potassium: 3.5 mmol/L (ref 3.5–5.1)
Sodium: 142 mmol/L (ref 135–145)
TCO2: 33 mmol/L — ABNORMAL HIGH (ref 22–32)

## 2024-01-31 LAB — CBC WITH DIFFERENTIAL/PLATELET
Abs Immature Granulocytes: 0.01 10*3/uL (ref 0.00–0.07)
Basophils Absolute: 0 10*3/uL (ref 0.0–0.1)
Basophils Relative: 0 %
Eosinophils Absolute: 0.5 10*3/uL (ref 0.0–0.5)
Eosinophils Relative: 13 %
HCT: 35 % — ABNORMAL LOW (ref 36.0–46.0)
Hemoglobin: 10.1 g/dL — ABNORMAL LOW (ref 12.0–15.0)
Immature Granulocytes: 0 %
Lymphocytes Relative: 45 %
Lymphs Abs: 1.7 10*3/uL (ref 0.7–4.0)
MCH: 25.7 pg — ABNORMAL LOW (ref 26.0–34.0)
MCHC: 28.9 g/dL — ABNORMAL LOW (ref 30.0–36.0)
MCV: 89.1 fL (ref 80.0–100.0)
Monocytes Absolute: 0.5 10*3/uL (ref 0.1–1.0)
Monocytes Relative: 12 %
Neutro Abs: 1.1 10*3/uL — ABNORMAL LOW (ref 1.7–7.7)
Neutrophils Relative %: 30 %
Platelets: 228 10*3/uL (ref 150–400)
RBC: 3.93 MIL/uL (ref 3.87–5.11)
RDW: 15.5 % (ref 11.5–15.5)
WBC: 3.8 10*3/uL — ABNORMAL LOW (ref 4.0–10.5)
nRBC: 0 % (ref 0.0–0.2)

## 2024-01-31 LAB — I-STAT CG4 LACTIC ACID, ED: Lactic Acid, Venous: 2.2 mmol/L (ref 0.5–1.9)

## 2024-01-31 MED ORDER — METHYLPREDNISOLONE SODIUM SUCC 40 MG IJ SOLR
40.0000 mg | Freq: Once | INTRAMUSCULAR | Status: AC
  Administered 2024-02-01: 40 mg via INTRAVENOUS
  Filled 2024-01-31: qty 1

## 2024-01-31 NOTE — ED Triage Notes (Signed)
 Pt in from home with family via [facility] being bagged on arrival. Per EMS, LSN was 8pm while talking to her daughter in bed. Pt suddenly became weak and lethargic, then began agonal breathing. Family performed CPR for a short while before EMS arrived. When EMS came on scene, pt was agonally breathing and BVM was initiated. Pt had transdermal Buprenorphine pain patch applied to back on arrival, removed and pt given 0.4mg  Narcan with positive response  VS en route: 148/98 94HR 98% BVM CBG 131

## 2024-01-31 NOTE — ED Provider Notes (Signed)
 [place] EMERGENCY DEPARTMENT AT [facility] Provider Note   CSN: [id number] Arrival date & time: 01/31/24  2320     History {Add pertinent medical, surgical, social history, OB history to HPI:1} Chief Complaint  Patient presents with   Unresponsive   Respiratory Arrest    Adalynne Steffensmeier is a 68 y.o. female.  The history is provided by the patient.  Hennesy Sobalvarro is a 68 y.o. female who presents to the Emergency Department complaining of ***      Home Medications Prior to Admission medications   Medication Sig Start Date End Date Taking? Authorizing Provider  ALPRAZolam Prudy Feeler) 1 MG tablet Take 1 mg by mouth 3 (three) times daily. 06/06/20   [provider]  apixaban (ELIQUIS) 2.5 MG TABS tablet Take 1 tablet (2.5 mg total) by mouth 2 (two) times daily. 10/02/23 12/15/24  [place], Alvira Philips, DO  atorvastatin (LIPITOR) 20 MG tablet Take 20 mg by mouth daily. Patient not taking: Reported on 12/29/2023 08/09/23   [provider]  buprenorphine (BUTRANS) 7.5 MCG/HR Place 1 patch onto the skin once a week. 12/01/23   [provider]  carvedilol (COREG) 6.25 MG tablet Take 1 tablet (6.25 mg total) by mouth 2 (two) times daily with a meal. 01/07/24   Bensimhon, Bevelyn Buckles, MD  cyclobenzaprine (FLEXERIL) 10 MG tablet Take 10 mg by mouth 3 (three) times daily as needed for muscle spasms. 09/09/23   [provider]  diclofenac Sodium (VOLTAREN ARTHRITIS PAIN) 1 % GEL Apply 4 g topically 3 (three) times daily.    [provider]  diphenhydrAMINE (BENADRYL) 25 MG tablet Take 100 mg by mouth daily as needed for sleep.    [provider]  furosemide (LASIX) 40 MG tablet Take 40 mg by mouth daily.    [provider]  gabapentin (NEURONTIN) 600 MG tablet Take 600 mg by mouth 3 (three) times daily.    [provider]  oxybutynin (DITROPAN) 5 MG tablet Take 5 mg by mouth every 12 (twelve) hours. 12/23/23   [provider]  oxyCODONE-acetaminophen (PERCOCET) 10-325 MG tablet Take 10-325 tablets by mouth 4 (four) times daily as needed for pain. 12/20/23   [provider]  OXYGEN Inhale 2 L into the lungs continuous.    [provider]  OZEMPIC, 2 MG/DOSE, 8 MG/3ML SOPN Inject 2 mg into the skin every Sunday. 09/05/23   [provider]  pantoprazole (PROTONIX) 40 MG tablet Take 40 mg by mouth 2 (two) times daily. 05/24/22   [provider]  sildenafil (REVATIO) 20 MG tablet Take 1 tablet (20 mg total) by mouth 3 (three) times daily. 06/14/21   Albertine Grates, MD  tiZANidine (ZANAFLEX) 4 MG tablet Take 1 tablet (4 mg total) by mouth every 6 (six) hours as needed for muscle spasms. Patient not taking: Reported on 12/29/2023 10/02/23 10/01/24  [place], Eric J, DO  Vitamin D, Ergocalciferol, (DRISDOL) 1.25 MG (50000 UNIT) CAPS capsule Take 50,000 Units by mouth once a week. 12/08/23   [provider]      Allergies    Belbuca [buprenorphine hcl], E.e.s. [erythromycin], Iodine, Shellfish allergy, Latex, Pork-derived products, and Other    Review of Systems   Review of Systems  Physical Exam Updated Vital Signs There were no vitals taken for this visit. Physical Exam  ED Results / Procedures / Treatments   Labs (all labs ordered are listed, but only abnormal results are displayed) Labs Reviewed  CULTURE, BLOOD (ROUTINE  X 2)  CULTURE, BLOOD (ROUTINE X 2)  COMPREHENSIVE METABOLIC PANEL  CBC WITH DIFFERENTIAL/PLATELET  ETHANOL  I-STAT CHEM 8, ED  CBG MONITORING, ED  I-STAT VENOUS BLOOD GAS, ED  I-STAT CG4 LACTIC ACID, ED  TROPONIN I (HIGH SENSITIVITY)    EKG None  Radiology No results found.  Procedures Procedures  {Document cardiac monitor, telemetry assessment procedure when appropriate:1}  Medications Ordered in ED Medications  methylPREDNISolone sodium succinate (SOLU-MEDROL) 40 mg/mL injection 40 mg (has no administration in time range)    ED  Course/ Medical Decision Making/ A&P   {   Click here for ABCD2, HEART and other calculatorsREFRESH Note before signing :1}                              Medical Decision Making Amount and/or Complexity of Data Reviewed Labs: ordered. Radiology: ordered.  Risk Prescription drug management.   ***  {Document critical care time when appropriate:1} {Document review of labs and clinical decision tools ie heart score, Chads2Vasc2 etc:1}  {Document your independent review of radiology images, and any outside records:1} {Document your discussion with family members, caretakers, and with consultants:1} {Document social determinants of health affecting pt's care:1} {Document your decision making why or why not admission, treatments were needed:1} Final Clinical Impression(s) / ED Diagnoses Final diagnoses:  None    Rx / DC Orders ED Discharge Orders     None

## 2024-01-31 NOTE — ED Notes (Signed)
 Patient transported to CT

## 2024-02-01 ENCOUNTER — Other Ambulatory Visit: Payer: Self-pay

## 2024-02-01 ENCOUNTER — Emergency Department (HOSPITAL_COMMUNITY): Payer: Medicare (Managed Care)

## 2024-02-01 DIAGNOSIS — T402X1A Poisoning by other opioids, accidental (unintentional), initial encounter: Secondary | ICD-10-CM | POA: Diagnosis not present

## 2024-02-01 DIAGNOSIS — T40491A Poisoning by other synthetic narcotics, accidental (unintentional), initial encounter: Secondary | ICD-10-CM | POA: Insufficient documentation

## 2024-02-01 LAB — URINALYSIS, W/ REFLEX TO CULTURE (INFECTION SUSPECTED)
Bilirubin Urine: NEGATIVE
Glucose, UA: NEGATIVE mg/dL
Hgb urine dipstick: NEGATIVE
Ketones, ur: NEGATIVE mg/dL
Nitrite: NEGATIVE
Protein, ur: 30 mg/dL — AB
Specific Gravity, Urine: 1.017 (ref 1.005–1.030)
pH: 5 (ref 5.0–8.0)

## 2024-02-01 LAB — CBG MONITORING, ED
Glucose-Capillary: 76 mg/dL (ref 70–99)
Glucose-Capillary: 86 mg/dL (ref 70–99)
Glucose-Capillary: 102 mg/dL — ABNORMAL HIGH (ref 70–99)
Glucose-Capillary: 119 mg/dL — ABNORMAL HIGH (ref 70–99)

## 2024-02-01 LAB — I-STAT CG4 LACTIC ACID, ED: Lactic Acid, Venous: 1.6 mmol/L (ref 0.5–1.9)

## 2024-02-01 LAB — TROPONIN I (HIGH SENSITIVITY)
Troponin I (High Sensitivity): 3 ng/L (ref <18)
Troponin I (High Sensitivity): 3 ng/L (ref <18)

## 2024-02-01 LAB — RAPID URINE DRUG SCREEN, HOSP PERFORMED
Amphetamines: NOT DETECTED
Barbiturates: NOT DETECTED
Benzodiazepines: POSITIVE — AB
Cocaine: NOT DETECTED
Opiates: POSITIVE — AB
Tetrahydrocannabinol: NOT DETECTED

## 2024-02-01 LAB — COMPREHENSIVE METABOLIC PANEL
ALT: 10 U/L (ref 0–44)
AST: 23 U/L (ref 15–41)
Albumin: 2.9 g/dL — ABNORMAL LOW (ref 3.5–5.0)
Alkaline Phosphatase: 61 U/L (ref 38–126)
Anion gap: 13 (ref 5–15)
BUN: 9 mg/dL (ref 8–23)
CO2: 29 mmol/L (ref 22–32)
Calcium: 8.8 mg/dL — ABNORMAL LOW (ref 8.9–10.3)
Chloride: 100 mmol/L (ref 98–111)
Creatinine, Ser: 0.95 mg/dL (ref 0.44–1.00)
GFR, Estimated: >60 mL/min (ref >60)
Glucose, Bld: 102 mg/dL — ABNORMAL HIGH (ref 70–99)
Potassium: 3.5 mmol/L (ref 3.5–5.1)
Sodium: 142 mmol/L (ref 135–145)
Total Bilirubin: 0.6 mg/dL (ref 0.0–1.2)
Total Protein: 6.1 g/dL — ABNORMAL LOW (ref 6.5–8.1)

## 2024-02-01 LAB — RESP PANEL BY RT-PCR (RSV, FLU A&B, COVID)  RVPGX2
Influenza A by PCR: NEGATIVE
Influenza B by PCR: NEGATIVE
Resp Syncytial Virus by PCR: NEGATIVE
SARS Coronavirus 2 by RT PCR: NEGATIVE

## 2024-02-01 LAB — HEMOGLOBIN A1C
Hgb A1c MFr Bld: 5.5 % (ref 4.8–5.6)
Mean Plasma Glucose: 111.15 mg/dL

## 2024-02-01 LAB — SEDIMENTATION RATE: Sed Rate: 21 mm/h (ref 0–22)

## 2024-02-01 LAB — T4, FREE: Free T4: 0.96 ng/dL (ref 0.61–1.12)

## 2024-02-01 LAB — C-REACTIVE PROTEIN: CRP: <0.5 mg/dL (ref <1.0)

## 2024-02-01 LAB — LACTIC ACID, PLASMA: Lactic Acid, Venous: 0.9 mmol/L (ref 0.5–1.9)

## 2024-02-01 LAB — TSH: TSH: 0.159 u[IU]/mL — ABNORMAL LOW (ref 0.350–4.500)

## 2024-02-01 LAB — CK: Total CK: 83 U/L (ref 38–234)

## 2024-02-01 MED ORDER — MORPHINE SULFATE (PF) 2 MG/ML IV SOLN
0.5000 mg | Freq: Once | INTRAVENOUS | Status: AC
  Administered 2024-02-01: 0.5 mg via INTRAVENOUS
  Filled 2024-02-01: qty 1

## 2024-02-01 MED ORDER — MORPHINE SULFATE (PF) 2 MG/ML IV SOLN
1.0000 mg | INTRAVENOUS | Status: DC | PRN
  Administered 2024-02-01: 1 mg via INTRAVENOUS
  Filled 2024-02-01: qty 1

## 2024-02-01 MED ORDER — ACETAMINOPHEN 650 MG RE SUPP: 650.0000 mg | Freq: Four times a day (QID) | RECTAL | Status: DC | PRN

## 2024-02-01 MED ORDER — ACETAMINOPHEN 325 MG PO TABS: 650.0000 mg | ORAL_TABLET | Freq: Four times a day (QID) | ORAL | Status: DC | PRN

## 2024-02-01 MED ORDER — OXYCODONE-ACETAMINOPHEN 10-325 MG PO TABS: 1.0000 | ORAL_TABLET | Freq: Four times a day (QID) | ORAL | Status: DC | PRN

## 2024-02-01 MED ORDER — APIXABAN 2.5 MG PO TABS
2.5000 mg | ORAL_TABLET | Freq: Two times a day (BID) | ORAL | Status: DC
Start: 2024-02-01 — End: 2024-02-01
  Administered 2024-02-01: 2.5 mg via ORAL
  Filled 2024-02-01: qty 1

## 2024-02-01 MED ORDER — ALPRAZOLAM 0.25 MG PO TABS
1.0000 mg | ORAL_TABLET | Freq: Three times a day (TID) | ORAL | Status: DC
  Administered 2024-02-01: 1 mg via ORAL
  Filled 2024-02-01: qty 4

## 2024-02-01 MED ORDER — KETOROLAC TROMETHAMINE 15 MG/ML IJ SOLN
15.0000 mg | Freq: Four times a day (QID) | INTRAMUSCULAR | Status: DC | PRN
  Administered 2024-02-01: 15 mg via INTRAVENOUS
  Filled 2024-02-01: qty 1

## 2024-02-01 MED ORDER — ONDANSETRON HCL 4 MG PO TABS: 4.0000 mg | ORAL_TABLET | Freq: Four times a day (QID) | ORAL | Status: DC | PRN

## 2024-02-01 MED ORDER — SODIUM CHLORIDE 0.9% FLUSH
3.0000 mL | Freq: Two times a day (BID) | INTRAVENOUS | Status: DC
  Administered 2024-02-01: 3 mL via INTRAVENOUS

## 2024-02-01 MED ORDER — ONDANSETRON HCL 4 MG/2ML IJ SOLN
4.0000 mg | Freq: Four times a day (QID) | INTRAMUSCULAR | Status: DC | PRN
Start: 2024-02-01 — End: 2024-02-01

## 2024-02-01 MED ORDER — CARVEDILOL 3.125 MG PO TABS
6.2500 mg | ORAL_TABLET | Freq: Two times a day (BID) | ORAL | Status: DC
  Administered 2024-02-01: 6.25 mg via ORAL
  Filled 2024-02-01: qty 2

## 2024-02-01 MED ORDER — PANTOPRAZOLE SODIUM 40 MG IV SOLR
40.0000 mg | INTRAVENOUS | Status: DC
  Administered 2024-02-01: 40 mg via INTRAVENOUS
  Filled 2024-02-01: qty 10

## 2024-02-01 MED ORDER — SILDENAFIL CITRATE 20 MG PO TABS
20.0000 mg | ORAL_TABLET | Freq: Three times a day (TID) | ORAL | Status: DC
Start: 2024-02-01 — End: 2024-02-01
  Administered 2024-02-01: 20 mg via ORAL
  Filled 2024-02-01 (×3): qty 1

## 2024-02-01 MED ORDER — SODIUM CHLORIDE 0.9 % IV BOLUS
500.0000 mL | Freq: Once | INTRAVENOUS | Status: AC
  Administered 2024-02-01: 500 mL via INTRAVENOUS

## 2024-02-01 MED ORDER — NALOXONE HCL 0.4 MG/ML IJ SOLN
0.2000 mg | Freq: Once | INTRAMUSCULAR | Status: AC
  Administered 2024-02-01: 0.2 mg via INTRAVENOUS
  Filled 2024-02-01: qty 1

## 2024-02-01 MED ORDER — PANTOPRAZOLE SODIUM 40 MG PO TBEC: 40.0000 mg | DELAYED_RELEASE_TABLET | Freq: Every day | ORAL | Status: DC

## 2024-02-01 MED ORDER — ENSURE ENLIVE PO LIQD
237.0000 mL | Freq: Three times a day (TID) | ORAL | Status: DC
  Filled 2024-02-01: qty 237

## 2024-02-01 MED ORDER — NALOXONE HCL 0.4 MG/ML IJ SOLN
INTRAMUSCULAR | Status: DC | PRN
  Administered 2024-01-31: .4 mg via INTRAVENOUS

## 2024-02-01 MED ORDER — IOHEXOL 350 MG/ML SOLN
75.0000 mL | Freq: Once | INTRAVENOUS | Status: AC | PRN
  Administered 2024-02-01: 75 mL via INTRAVENOUS

## 2024-02-01 MED ORDER — OXYCODONE HCL 5 MG PO TABS
5.0000 mg | ORAL_TABLET | Freq: Four times a day (QID) | ORAL | Status: DC | PRN
  Administered 2024-02-01: 5 mg via ORAL
  Filled 2024-02-01: qty 1

## 2024-02-01 MED ORDER — OXYCODONE-ACETAMINOPHEN 5-325 MG PO TABS
1.0000 | ORAL_TABLET | Freq: Four times a day (QID) | ORAL | Status: DC | PRN
  Administered 2024-02-01: 1 via ORAL
  Filled 2024-02-01: qty 1

## 2024-02-01 MED ORDER — INSULIN ASPART 100 UNIT/ML IJ SOLN: 0.0000 [IU] | Freq: Three times a day (TID) | INTRAMUSCULAR | Status: DC

## 2024-02-01 NOTE — ED Notes (Signed)
 Please call son for updates (spouse) Kathy Breach [phone]

## 2024-02-01 NOTE — Progress Notes (Signed)
 Consult received to start 20g IV for CTA. This RN attempted once with [place]. Teresa Wiley, IV RN at bedside to assess. Veins are too small or too deep for US guided IV or midline. Primary RN notified.

## 2024-02-01 NOTE — ED Notes (Signed)
 Please call son for updates Toiya Morrish [phone]

## 2024-02-01 NOTE — ED Notes (Signed)
 Pt very hard stick - unable to obtain 2nd set of blood cultures at this time

## 2024-02-01 NOTE — Discharge Summary (Signed)
 Physician Discharge Summary  Shameka Aggarwal ZOX:[id number] DOB: [birth year] DOA: 01/31/2024  PCP: Maye Hides, PA  Admit date: 01/31/2024 Discharge date: 02/01/2024  Admitted From: Home Discharge disposition: Home  Recommendations at discharge:  Stop buprenorphine patch.   Okay to continue low-dose of Percocet.   Exercise caution on using Xanax while on opioids. Follow-up with pain management specialist as an outpatient.  Brief narrative: Teresa Wiley is a 68 y.o. 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   3/8, patient was brought to the ED from home by EMS for acute respiratory failure due opioid toxicity from buprenorphine patch. Follows up with cardiologist Dr. Shirlee Latch for CHF, pulm hypertension Patient has chronic back pain and follows up with pain management as an outpatient.  She was recently started on buprenorphine patch and took the first dose on 3/8. Patient was last seen normal around 8 PM while talking to her daughter.  Patient became suddenly weak, lethargic and began agonal breathing.  Family started CPR until EMS took over.  EMS noted agonal breathing and initiated BVM.   She had a transdermal buprenorphine pain patch on her back which was removed and patient was given a total of 0.6 mg of Narcan with positive response. She was placed on supplemental oxygen and brought to the ED  In the ED, patient was afebrile, heart rate 90s, hemodynamically stable, breathing on 3 L oxygen. Initial labs with WC count 3.8, hemoglobin 8.1, blood alcohol level not elevated Blood gas with pH 7.37, pCO2 61, bicarb 35 CT head unremarkable Chest x-ray showed small bilateral pleural effusion, mild bibasilar atelectasis superior  mediastinal widening, likely related to semi-erect positioning. Resp virus panel negative Blood culture collected Urinalysis showed cloudy yellow urine with small leukocytes, few bacteria Urine drug screen positive for benzodiazepine and opiates CT angio chest ruled out PE or aortic dissection.  It showed cardiomegaly with multichamber enlargement, moderate size hiatal hernia  Hospitalist service was consulted for observation because of altered mental status  Subjective: Patient was seen and examined this morning. Lying on bed.  Was alert, awake, able to answer orientation questions and give the details of the history.  Hospital course: Unintentional overdose of topical buprenorphine Chronic pain  Chronic narcotic dependence Patient has chronic back pain and follows up with pain management as an outpatient.  She was recently started on buprenorphine patch and took the first dose on 3/8.  It seems she was also taking Xanax 1 mg 3 times daily at home Mental status improved with total of 0.6 mg Narcan Later this morning, patient started complaining of excruciating pain she was given a dose of Percocet as well as IV morphine.  She tolerated it well. I evaluated the patient later this afternoon as well.  Alert, awake, oriented x 3.  Family at bedside.  Another family member was on the phone as well.  We had a long conversation. It seems patient had similar reaction to buprenorphine in the past as well.  At this time, I would advised to completely stop using the patch.  She has Percocet 10/325 mg at home which she can resume preferably at a lower dose. To follow-up with pain management specialist as an outpatient.  Acute on chronic respiratory failure with hypoxia and hypercapnia Chronically oxygen dependent due to pulmonary hypertension  Acute worsening due to narcotic overdose Back to baseline oxygen requirement now.  Lactic acidosis Initially elevated lactic acid likely due to anaerobic  respiration.  Subsequently improved. Recent Labs  Lab 01/31/24 2315 01/31/24 2326 02/01/24 0439 02/01/24 1308  WBC 3.8*  --   --   --   LATICACIDVEN  --  2.2* 1.6 0.9    Diabetes mellitus 2 On Ozempic at home A1c 5.5 on 02/01/2024  Low TSH T4 is normal   HFpEF Pulmonary hypertension Essential hypertension Follows up with cardiology Dr. Marijean Niemann euvolemic PTA meds- Coreg 6.25 mg twice daily, Lasix 40 mg daily, Continue the same   H/o recurrent PE Continue Eliquis CTA this admission without evidence of new PE   NASH LFTs are stable and ammonia level was within normal limits No evidence of cirrhosis on prior imaging   Obesity BMI 32 Patient has lost weight on Ozempic which she continues to take   Protein calorie malnutrition Albumin 2.9 and total protein 6.1 Nutrition consultation Protein shakes  Anxiety PTA meds-Xanax 1 mg 3 times daily. Exercise caution on using Xanax while on opioids.  Goals of care   Code Status: Full Code   Diet:  Diet Order             Diet Carb Modified Fluid consistency: Thin; Room service appropriate? Yes  Diet effective now           Diet general                   Nutritional status:  Body mass index is 32.62 kg/m.       Wounds:  -    Discharge Exam:   Vitals:   02/01/24 1500 02/01/24 1515 02/01/24 1530 02/01/24 1544  BP:    111/78  Pulse: 89 88 87 81  Resp: (!) 23 12 16  (!) 23  Temp:    98.3 F (36.8 C)  TempSrc:    Oral  SpO2: 100% 98% 100% 100%  Weight:        Body mass index is 32.62 kg/m.  General exam: Pleasant, elderly African-American female Skin: No rashes, lesions or ulcers. HEENT: Atraumatic, normocephalic, no obvious bleeding Lungs: Clear to auscultation bilaterally,  CVS: S1, S2, no murmur,   GI/Abd: Soft, nontender, nondistended, bowel sound present,   CNS: Alert, awake, oriented x 3 Psychiatry: Sad affect Extremities: No pedal edema, no calf tenderness,   Follow ups:     Follow-up Information     Maye Hides, PA Follow up.   Specialty: Physician Assistant Contact information: [phone] [place] [address] [phone]                 Discharge Instructions:   Discharge Instructions     Call MD for:  difficulty breathing, headache or visual disturbances   Complete by: As directed    Call MD for:  extreme fatigue   Complete by: As directed    Call MD for:  hives   Complete by: As directed    Call MD for:  persistant dizziness or light-headedness   Complete by: As directed    Call MD for:  persistant nausea and vomiting   Complete by: As directed    Call MD for:  severe uncontrolled pain   Complete by: As directed    Call MD for:  temperature >100.4   Complete by: As directed    Diet general   Complete by: As directed    Discharge instructions  Complete by: As directed    Recommendations at discharge:   Stop buprenorphine patch.    Okay to continue low-dose of Percocet.    Exercise caution on using Xanax while on opioids.  Follow-up with pain management specialist as an outpatient.  PDMP reviewed this encounter.   Opioid taper instructions: It is important to wean off of your opioid medication as soon as possible. If you do not need pain medication after your surgery it is ok to stop day one. Opioids include: Codeine, Hydrocodone(Norco, Vicodin), Oxycodone(Percocet, oxycontin) and hydromorphone amongst others.  Long term and even short term use of opiods can cause: Increased pain response Dependence Constipation Depression Respiratory depression And more.  Withdrawal symptoms can include Flu like symptoms Nausea, vomiting And more Techniques to manage these symptoms Hydrate well Eat regular healthy meals Stay active Use relaxation techniques(deep breathing, meditating, yoga) Do Not substitute Alcohol to help with tapering If you have been on opioids for less than two weeks and do not have pain than it  is ok to stop all together.  Plan to wean off of opioids This plan should start within one week post op of your joint replacement. Maintain the same interval or time between taking each dose and first decrease the dose.  Cut the total daily intake of opioids by one tablet each day Next start to increase the time between doses. The last dose that should be eliminated is the evening dose.        General discharge instructions: Follow with Primary MD Maye Hides, PA in 7 days  Please request your PCP  to go over your hospital tests, procedures, radiology results at the follow up. Please get your medicines reviewed and adjusted.  Your PCP may decide to repeat certain labs or tests as needed. Do not drive, operate heavy machinery, perform activities at heights, swimming or participation in water activities or provide baby sitting services if your were admitted for syncope or siezures until you have seen by Primary MD or a Neurologist and advised to do so again. [place] Controlled Substance Reporting System database was reviewed. Do not drive, operate heavy machinery, perform activities at heights, swim, participate in water activities or provide baby-sitting services while on medications for pain, sleep and mood until your outpatient physician has reevaluated you and advised to do so again.  You are strongly recommended to comply with the dose, frequency and duration of prescribed medications. Activity: As tolerated with Full fall precautions use walker/cane & assistance as needed Avoid using any recreational substances like cigarette, tobacco, alcohol, or non-prescribed drug. If you experience worsening of your admission symptoms, develop shortness of breath, life threatening emergency, suicidal or homicidal thoughts you must seek medical attention immediately by calling 911 or calling your MD immediately  if symptoms less severe. You must read complete instructions/literature along with  all the possible adverse reactions/side effects for all the medicines you take and that have been prescribed to you. Take any new medicine only after you have completely understood and accepted all the possible adverse reactions/side effects.  Wear Seat belts while driving. You were cared for by a hospitalist during your hospital stay. If you have any questions about your discharge medications or the care you received while you were in the hospital after you are discharged, you can call the unit and ask to speak with the hospitalist or the covering physician. Once you are discharged, your primary care physician will handle any further medical issues. Please note  that NO REFILLS for any discharge medications will be authorized once you are discharged, as it is imperative that you return to your primary care physician (or establish a relationship with a primary care physician if you do not have one).   Increase activity slowly   Complete by: As directed        Discharge Medications:   Allergies as of 02/01/2024       Reactions   Belbuca [buprenorphine Hcl] Hives   E.e.s. [erythromycin] Anaphylaxis, Nausea And Vomiting   Iodine Hives, Swelling, Other (See Comments)   Throat swelling   Shellfish Allergy Anaphylaxis, Hives   Latex    Powder in the latex and balloons.   Pork-derived Products    religious   Other Rash   Electrodes stickers        Medication List     STOP taking these medications    buprenorphine 7.5 MCG/HR Commonly known as: BUTRANS       TAKE these medications    ALPRAZolam 1 MG tablet Commonly known as: XANAX Take 1 mg by mouth 3 (three) times daily.   apixaban 5 MG Tabs tablet Commonly known as: ELIQUIS Take 5 mg by mouth 2 (two) times daily.   atorvastatin 20 MG tablet Commonly known as: LIPITOR Take 20 mg by mouth daily.   carvedilol 6.25 MG tablet Commonly known as: COREG Take 1 tablet (6.25 mg total) by mouth 2 (two) times daily with a meal.    cyclobenzaprine 10 MG tablet Commonly known as: FLEXERIL Take 10 mg by mouth 3 (three) times daily as needed for muscle spasms.   diphenhydrAMINE 25 MG tablet Commonly known as: BENADRYL Take 100 mg by mouth daily as needed for sleep.   furosemide 40 MG tablet Commonly known as: LASIX Take 40 mg by mouth daily.   gabapentin 600 MG tablet Commonly known as: NEURONTIN Take 600 mg by mouth 3 (three) times daily as needed (pain).   oxybutynin 5 MG tablet Commonly known as: DITROPAN Take 5 mg by mouth every 12 (twelve) hours.   oxyCODONE-acetaminophen 10-325 MG tablet Commonly known as: PERCOCET Take 1 tablet by mouth 4 (four) times daily as needed for pain. What changed: how much to take   OXYGEN Inhale 2 L into the lungs continuous.   Ozempic (2 MG/DOSE) 8 MG/3ML Sopn Generic drug: Semaglutide (2 MG/DOSE) Inject 2 mg into the skin once a week. On Thursday.   pantoprazole 40 MG tablet Commonly known as: PROTONIX Take 40 mg by mouth 2 (two) times daily.   sildenafil 20 MG tablet Commonly known as: REVATIO Take 1 tablet (20 mg total) by mouth 3 (three) times daily.   Vitamin D (Ergocalciferol) 1.25 MG (50000 UNIT) Caps capsule Commonly known as: DRISDOL Take 50,000 Units by mouth once a week.   Voltaren Arthritis Pain 1 % Gel Generic drug: diclofenac Sodium Apply 4 g topically 3 (three) times daily.         The results of significant diagnostics from this hospitalization (including imaging, microbiology, ancillary and laboratory) are listed below for reference.    Procedures and Diagnostic Studies:   CT Angio Chest/Abd/Pel for Dissection W and/or W/WO Result Date: 02/01/2024 CLINICAL DATA:  Acute aortic syndrome (AAS) suspected EXAM: CT ANGIOGRAPHY CHEST, ABDOMEN AND PELVIS TECHNIQUE: Non-contrast CT of the chest was initially obtained. Multidetector CT imaging through the chest, abdomen and pelvis was performed using the standard protocol during bolus  administration of intravenous contrast. Multiplanar reconstructed images and MIPs were obtained  and reviewed to evaluate the vascular anatomy. RADIATION DOSE REDUCTION: This exam was performed according to the departmental dose-optimization program which includes automated exposure control, adjustment of the mA and/or kV according to patient size and/or use of iterative reconstruction technique. CONTRAST:  75mL OMNIPAQUE IOHEXOL 350 MG/ML SOLN COMPARISON:  Chest XR, 01/31/2024 FINDINGS: Suboptimal evaluation, secondary to motion degradation and poor contrast opacification such that subsegmental emboli could missed. CTA CHEST FINDINGS Cardiovascular: Satisfactory opacification of the pulmonary arteries to the segmental level. No segmental or larger pulmonary embolus. Cardiomegaly with multi chamber cardiac enlargement, greater within the LEFT ventricle. Question LEFT ventricular apical wall thinning. Atrial occluder. No pericardial effusion. No evidence of thoracic aortic aneurysm or dissection. Dilated main PA, measuring 3.5 cm Mediastinum/Nodes: No enlarged mediastinal, hilar, or axillary lymph nodes. Thyroid gland, trachea, and esophagus demonstrate no significant findings. Lungs/Pleura: Pulmonary hypoinflation with mild pleural thickening, greater on RIGHT, with minimal atelectasis. No pleural effusion or pneumothorax. Musculoskeletal: No acute chest wall abnormality. No acute osseous findings. Review of the MIP images confirms the above findings. CTA ABDOMEN AND PELVIS FINDINGS VASCULAR Aorta: Minimal thoracoabdominal calcified atherosclerosis, with normal caliber. No aneurysm, dissection, vasculitis or significant stenosis. Celiac: Patent without evidence of aneurysm, dissection, vasculitis or significant stenosis. SMA: Patent without evidence of aneurysm, dissection, vasculitis or significant stenosis. Renals: Single renal arteries are present bilaterally. Both renal arteries are patent without evidence of  aneurysm, dissection, vasculitis, fibromuscular dysplasia or significant stenosis. IMA: Patent without evidence of aneurysm, dissection, vasculitis or significant stenosis. Inflow: Patent without evidence of aneurysm, dissection, vasculitis or significant stenosis. Veins: No obvious venous abnormality within the limitations of this arterial phase study. Review of the MIP images confirms the above findings. NON-VASCULAR Hepatobiliary: No focal liver abnormality is seen. cholecystectomy. No intrahepatic biliary dilatation. Pancreas: Relative fatty atrophy. No pancreatic ductal dilatation or surrounding inflammatory changes. Spleen: Normal in size without focal abnormality. Adrenals/Urinary Tract: Adrenal glands are unremarkable. Kidneys are normal, without renal calculi, focal lesion, or hydronephrosis. Bladder is unremarkable. Stomach/Bowel: Moderate sized hiatus hernia. Stomach is otherwise nondistended. Appendix is not definitively visualized. No evidence of bowel wall thickening, distention, or inflammatory changes. Lymphatic: No enlarged abdominal or pelvic lymph nodes. Reproductive: Uterus and adnexa are unremarkable. Other: Fat-containing umbilical hernia with a 1.5 cm fascial defect. No abdominopelvic ascites. Musculoskeletal: No acute osseous findings. Review of the MIP images confirms the above findings. IMPRESSION: 1. No acute thoracic vascular abnormality. Specifically, nonaneurysmal thoracoabdominal aorta without CTA evidence of dissection. 2.  No segmental or larger pulmonary embolus. Main pulmonary artery dilation, likely reflecting underlying pulmonary arterial hypertension. 3. Cardiomegaly with multi chamber enlargement, greater within the LEFT ventricle. 4. No acute abdominopelvic process. 5. Moderate-sized hiatus hernia. Additional incidental, chronic and senescent findings as above. Electronically Signed   By: Roanna Banning M.D.   On: 02/01/2024 07:36   CT Head Wo Contrast Result Date:  02/01/2024 CLINICAL DATA:  Mental status change, unknown cause EXAM: CT HEAD WITHOUT CONTRAST TECHNIQUE: Contiguous axial images were obtained from the base of the skull through the vertex without intravenous contrast. RADIATION DOSE REDUCTION: This exam was performed according to the departmental dose-optimization program which includes automated exposure control, adjustment of the mA and/or kV according to patient size and/or use of iterative reconstruction technique. COMPARISON:  CT head December 29, 2023. FINDINGS: Brain: No evidence of acute infarction, hemorrhage, hydrocephalus, extra-axial collection or mass lesion/mass effect. Vascular: No hyperdense vessel. Skull: No acute fracture. Sinuses/Orbits: Clear sinuses.  No acute orbital findings. Other: No  mastoid effusions. IMPRESSION: No evidence of acute intracranial abnormality. Electronically Signed   By: Feliberto Harts M.D.   On: 02/01/2024 00:18   DG Chest Port 1 View Result Date: 01/31/2024 CLINICAL DATA:  Altered mental status EXAM: PORTABLE CHEST 1 VIEW COMPARISON:  None Available. FINDINGS: Small bilateral pleural effusions are present contributing to bibasilar opacity. Mild bibasilar atelectasis or infiltrate. No pneumothorax. Cardiac size within normal limits. Superior mediastinal widening may relate to semi-erect positioning. No acute bone abnormality. IMPRESSION: 1. Small bilateral pleural effusions. Mild bibasilar atelectasis or infiltrate. 2. Superior mediastinal widening, likely related to semi-erect positioning. If there is clinical concern for mediastinal pathology, a repeat upright PA and lateral chest radiograph is recommended. Electronically Signed   By: Helyn Numbers M.D.   On: 01/31/2024 23:40     Labs:   Basic Metabolic Panel: Recent Labs  Lab 01/31/24 2315 01/31/24 2323 01/31/24 2326  NA 142 142 142  K 3.5 3.5 3.5  CL 100 99  --   CO2 29  --   --   GLUCOSE 102* 97  --   BUN 9 10  --   CREATININE 0.95 1.10*  --    CALCIUM 8.8*  --   --    GFR Estimated Creatinine Clearance: 63.4 mL/min (A) (by C-G formula based on SCr of 1.1 mg/dL (H)). Liver Function Tests: Recent Labs  Lab 01/31/24 2315  AST 23  ALT 10  ALKPHOS 61  BILITOT 0.6  PROT 6.1*  ALBUMIN 2.9*   No results for input(s): "LIPASE", "AMYLASE" in the last 168 hours. No results for input(s): "AMMONIA" in the last 168 hours. Coagulation profile No results for input(s): "INR", "PROTIME" in the last 168 hours.  CBC: Recent Labs  Lab 01/31/24 2315 01/31/24 2323 01/31/24 2326  WBC 3.8*  --   --   NEUTROABS 1.1*  --   --   HGB 10.1* 11.6* 11.6*  HCT 35.0* 34.0* 34.0*  MCV 89.1  --   --   PLT 228  --   --    Cardiac Enzymes: Recent Labs  Lab 02/01/24 0823  CKTOTAL 83   BNP: Invalid input(s): "POCBNP" CBG: Recent Labs  Lab 02/01/24 0108 02/01/24 0339 02/01/24 0623 02/01/24 1108  GLUCAP 76 86 102* 119*   D-Dimer No results for input(s): "DDIMER" in the last 72 hours. Hgb A1c Recent Labs    02/01/24 0823  HGBA1C 5.5   Lipid Profile No results for input(s): "CHOL", "HDL", "LDLCALC", "TRIG", "CHOLHDL", "LDLDIRECT" in the last 72 hours. Thyroid function studies Recent Labs    02/01/24 0823  TSH 0.159*   Anemia work up No results for input(s): "VITAMINB12", "FOLATE", "FERRITIN", "TIBC", "IRON", "RETICCTPCT" in the last 72 hours. Microbiology Recent Results (from the past 240 hours)  Culture, blood (routine x 2)     Status: None (Preliminary result)   Collection Time: 02/01/24 12:54 AM   Specimen: BLOOD  Result Value Ref Range Status   Specimen Description BLOOD RIGHT ARM  Final   Special Requests   Final    BOTTLES DRAWN AEROBIC ONLY Blood Culture results may not be optimal due to an inadequate volume of blood received in culture bottles   Culture   Final    NO GROWTH < 12 HOURS Performed at [facility], [address], [place], [place] [address]    Report Status PENDING  Incomplete  Resp panel  by RT-PCR (RSV, Flu A&B, Covid) Anterior Nasal Swab     Status:  None   Collection Time: 02/01/24  1:03 AM   Specimen: Anterior Nasal Swab  Result Value Ref Range Status   SARS Coronavirus 2 by RT PCR NEGATIVE NEGATIVE Final   Influenza A by PCR NEGATIVE NEGATIVE Final   Influenza B by PCR NEGATIVE NEGATIVE Final    Comment: (NOTE) The Xpert Xpress SARS-CoV-2/FLU/RSV plus assay is intended as an aid in the diagnosis of influenza from Nasopharyngeal swab specimens and should not be used as a sole basis for treatment. Nasal washings and aspirates are unacceptable for Xpert Xpress SARS-CoV-2/FLU/RSV testing.  Fact Sheet for Patients: BloggerCourse.com  Fact Sheet for Healthcare Providers: SeriousBroker.it  This test is not yet approved or cleared by the [place] FDA and has been authorized for detection and/or diagnosis of SARS-CoV-2 by FDA under an Emergency Use Authorization (EUA). This EUA will remain in effect (meaning this test can be used) for the duration of the COVID-19 declaration under Section 564(b)(1) of the Act, 21 U.S.C. section 360bbb-3(b)(1), unless the authorization is terminated or revoked.     Resp Syncytial Virus by PCR NEGATIVE NEGATIVE Final    Comment: (NOTE) Fact Sheet for Patients: BloggerCourse.com  Fact Sheet for Healthcare Providers: SeriousBroker.it  This test is not yet approved or cleared by the [place] FDA and has been authorized for detection and/or diagnosis of SARS-CoV-2 by FDA under an Emergency Use Authorization (EUA). This EUA will remain in effect (meaning this test can be used) for the duration of the COVID-19 declaration under Section 564(b)(1) of the Act, 21 U.S.C. section 360bbb-3(b)(1), unless the authorization is terminated or revoked.  Performed at [facility], [address] [place], [place] [address]     Time  coordinating discharge: 45 minutes  Signed: Jessieca Rhem  [facility] 02/01/2024, 3:56 PM

## 2024-02-01 NOTE — ED Notes (Signed)
2 unsuccessful IV attempts by this RN 

## 2024-02-01 NOTE — ED Notes (Signed)
 IV team unsuccessful with [place] IV. CT tech came to assess established line to see if it would work for Angio scan

## 2024-02-01 NOTE — ED Notes (Addendum)
 Patients daughter Merry Proud called and asked for doctor to give her a call. MD notified.

## 2024-02-01 NOTE — Progress Notes (Signed)
 PT Cancellation Note  Patient Details Name: Makaila Windle MRN: [id number] DOB: 09/16/1956   Cancelled Treatment:    Reason Eval/Treat Not Completed: PT screened, no needs identified, will sign off; RN reports ready for d/c and pt/family decline the need for PT.  Will sign off.   Elray Mcgregor 02/01/2024, 4:00 PM Sheran Lawless, PT Acute Rehabilitation Services Office:[phone] 02/01/2024
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
## 2024-02-03 LAB — T3: T3, Total: 116 ng/dL (ref 71–180)

## 2024-02-06 LAB — CULTURE, BLOOD (ROUTINE X 2): Culture: NO GROWTH

## 2024-04-07 ENCOUNTER — Observation Stay (HOSPITAL_COMMUNITY)
Admission: EM | Admit: 2024-04-07 | Discharge: 2024-04-08 | Disposition: A | Discharge status: Home / Self Care | Payer: Medicare (Managed Care) | Attending: Surgery | Admitting: Surgery

## 2024-04-07 ENCOUNTER — Emergency Department (HOSPITAL_COMMUNITY): Payer: Medicare (Managed Care)

## 2024-04-07 ENCOUNTER — Observation Stay (HOSPITAL_BASED_OUTPATIENT_CLINIC_OR_DEPARTMENT_OTHER): Payer: Medicare (Managed Care)

## 2024-04-07 ENCOUNTER — Other Ambulatory Visit: Payer: Self-pay

## 2024-04-07 ENCOUNTER — Encounter (HOSPITAL_COMMUNITY): Payer: Self-pay | Admitting: Internal Medicine

## 2024-04-07 DIAGNOSIS — N179 Acute kidney failure, unspecified: Secondary | ICD-10-CM

## 2024-04-07 DIAGNOSIS — R55 Syncope and collapse: Secondary | ICD-10-CM | POA: Diagnosis present

## 2024-04-07 DIAGNOSIS — W19XXXA Unspecified fall, initial encounter: Principal | ICD-10-CM

## 2024-04-07 DIAGNOSIS — Z79899 Other long term (current) drug therapy: Secondary | ICD-10-CM

## 2024-04-07 DIAGNOSIS — I2699 Other pulmonary embolism without acute cor pulmonale: Secondary | ICD-10-CM | POA: Insufficient documentation

## 2024-04-07 DIAGNOSIS — I110 Hypertensive heart disease with heart failure: Secondary | ICD-10-CM | POA: Diagnosis not present

## 2024-04-07 DIAGNOSIS — F1120 Opioid dependence, uncomplicated: Secondary | ICD-10-CM | POA: Diagnosis not present

## 2024-04-07 DIAGNOSIS — I272 Pulmonary hypertension, unspecified: Secondary | ICD-10-CM | POA: Diagnosis not present

## 2024-04-07 DIAGNOSIS — D649 Anemia, unspecified: Secondary | ICD-10-CM | POA: Diagnosis not present

## 2024-04-07 DIAGNOSIS — E119 Type 2 diabetes mellitus without complications: Secondary | ICD-10-CM | POA: Diagnosis not present

## 2024-04-07 DIAGNOSIS — J9611 Chronic respiratory failure with hypoxia: Secondary | ICD-10-CM | POA: Diagnosis not present

## 2024-04-07 DIAGNOSIS — I5030 Unspecified diastolic (congestive) heart failure: Secondary | ICD-10-CM | POA: Insufficient documentation

## 2024-04-07 DIAGNOSIS — G894 Chronic pain syndrome: Secondary | ICD-10-CM | POA: Diagnosis not present

## 2024-04-07 DIAGNOSIS — I5032 Chronic diastolic (congestive) heart failure: Secondary | ICD-10-CM | POA: Diagnosis not present

## 2024-04-07 DIAGNOSIS — Z9181 History of falling: Secondary | ICD-10-CM | POA: Diagnosis present

## 2024-04-07 LAB — I-STAT CHEM 8, ED
BUN: 16 mg/dL (ref 8–23)
Calcium, Ion: 1.17 mmol/L (ref 1.15–1.40)
Chloride: 95 mmol/L — ABNORMAL LOW (ref 98–111)
Creatinine, Ser: 1.5 mg/dL — ABNORMAL HIGH (ref 0.44–1.00)
Glucose, Bld: 104 mg/dL — ABNORMAL HIGH (ref 70–99)
HCT: 39 % (ref 36.0–46.0)
Hemoglobin: 13.3 g/dL (ref 12.0–15.0)
Potassium: 4.1 mmol/L (ref 3.5–5.1)
Sodium: 139 mmol/L (ref 135–145)
TCO2: 34 mmol/L — ABNORMAL HIGH (ref 22–32)

## 2024-04-07 LAB — CBC
HCT: 38.4 % (ref 36.0–46.0)
Hemoglobin: 11.1 g/dL — ABNORMAL LOW (ref 12.0–15.0)
MCH: 25.3 pg — ABNORMAL LOW (ref 26.0–34.0)
MCHC: 28.9 g/dL — ABNORMAL LOW (ref 30.0–36.0)
MCV: 87.5 fL (ref 80.0–100.0)
Platelets: 375 10*3/uL (ref 150–400)
RBC: 4.39 MIL/uL (ref 3.87–5.11)
RDW: 13.9 % (ref 11.5–15.5)
WBC: 6.9 10*3/uL (ref 4.0–10.5)
nRBC: 0 % (ref 0.0–0.2)

## 2024-04-07 LAB — COMPREHENSIVE METABOLIC PANEL WITH GFR
ALT: 7 U/L (ref 0–44)
AST: 13 U/L — ABNORMAL LOW (ref 15–41)
Albumin: 3.5 g/dL (ref 3.5–5.0)
Alkaline Phosphatase: 81 U/L (ref 38–126)
Anion gap: 10 (ref 5–15)
BUN: 13 mg/dL (ref 8–23)
CO2: 32 mmol/L (ref 22–32)
Calcium: 9.2 mg/dL (ref 8.9–10.3)
Chloride: 97 mmol/L — ABNORMAL LOW (ref 98–111)
Creatinine, Ser: 1.48 mg/dL — ABNORMAL HIGH (ref 0.44–1.00)
GFR, Estimated: 39 mL/min — ABNORMAL LOW (ref >60)
Glucose, Bld: 107 mg/dL — ABNORMAL HIGH (ref 70–99)
Potassium: 4.2 mmol/L (ref 3.5–5.1)
Sodium: 139 mmol/L (ref 135–145)
Total Bilirubin: 0.3 mg/dL (ref 0.0–1.2)
Total Protein: 7.5 g/dL (ref 6.5–8.1)

## 2024-04-07 LAB — ECHOCARDIOGRAM COMPLETE
Area-P 1/2: 3.65 cm2
Height: 69 in
S' Lateral: 2.8 cm
Weight: 3600 [oz_av]

## 2024-04-07 LAB — SAMPLE TO BLOOD BANK

## 2024-04-07 LAB — PROTIME-INR
INR: 1.1 (ref 0.8–1.2)
Prothrombin Time: 14.7 s (ref 11.4–15.2)

## 2024-04-07 LAB — I-STAT CG4 LACTIC ACID, ED: Lactic Acid, Venous: 1.1 mmol/L (ref 0.5–1.9)

## 2024-04-07 LAB — ETHANOL: Alcohol, Ethyl (B): <15 mg/dL (ref <15)

## 2024-04-07 MED ORDER — APIXABAN 5 MG PO TABS
5.0000 mg | ORAL_TABLET | Freq: Two times a day (BID) | ORAL | Status: DC
Start: 2024-04-07 — End: 2024-04-08
  Administered 2024-04-07 – 2024-04-08 (×2): 5 mg via ORAL
  Filled 2024-04-07 (×2): qty 1

## 2024-04-07 MED ORDER — ATORVASTATIN CALCIUM 10 MG PO TABS
20.0000 mg | ORAL_TABLET | Freq: Every day | ORAL | Status: DC
  Administered 2024-04-07 – 2024-04-08 (×2): 20 mg via ORAL
  Filled 2024-04-07 (×2): qty 2

## 2024-04-07 MED ORDER — OXYCODONE-ACETAMINOPHEN 5-325 MG PO TABS
1.0000 | ORAL_TABLET | ORAL | Status: DC | PRN
  Administered 2024-04-07 – 2024-04-08 (×5): 1 via ORAL
  Filled 2024-04-07 (×5): qty 1

## 2024-04-07 MED ORDER — OXYCODONE HCL 5 MG PO TABS
5.0000 mg | ORAL_TABLET | ORAL | Status: DC | PRN
  Administered 2024-04-07 – 2024-04-08 (×5): 5 mg via ORAL
  Filled 2024-04-07 (×5): qty 1

## 2024-04-07 MED ORDER — PANTOPRAZOLE SODIUM 40 MG PO TBEC
40.0000 mg | DELAYED_RELEASE_TABLET | Freq: Every day | ORAL | Status: DC
  Administered 2024-04-07 – 2024-04-08 (×2): 40 mg via ORAL
  Filled 2024-04-07 (×2): qty 1

## 2024-04-07 MED ORDER — CARVEDILOL 6.25 MG PO TABS
6.2500 mg | ORAL_TABLET | Freq: Two times a day (BID) | ORAL | Status: DC
  Administered 2024-04-07 – 2024-04-08 (×3): 6.25 mg via ORAL
  Filled 2024-04-07 (×2): qty 1
  Filled 2024-04-07: qty 2

## 2024-04-07 MED ORDER — FENTANYL CITRATE PF 50 MCG/ML IJ SOSY
50.0000 ug | PREFILLED_SYRINGE | Freq: Once | INTRAMUSCULAR | Status: AC
  Administered 2024-04-07: 50 ug via INTRAVENOUS
  Filled 2024-04-07: qty 1

## 2024-04-07 MED ORDER — ALPRAZOLAM 0.5 MG PO TABS
1.0000 mg | ORAL_TABLET | Freq: Three times a day (TID) | ORAL | Status: DC
  Administered 2024-04-07 – 2024-04-08 (×3): 1 mg via ORAL
  Filled 2024-04-07 (×2): qty 2
  Filled 2024-04-07: qty 4

## 2024-04-07 MED ORDER — OXYBUTYNIN CHLORIDE 5 MG PO TABS
5.0000 mg | ORAL_TABLET | Freq: Two times a day (BID) | ORAL | Status: DC
  Administered 2024-04-07 – 2024-04-08 (×2): 5 mg via ORAL
  Filled 2024-04-07 (×2): qty 1

## 2024-04-07 MED ORDER — IOHEXOL 350 MG/ML SOLN
75.0000 mL | Freq: Once | INTRAVENOUS | Status: AC | PRN
  Administered 2024-04-07: 75 mL via INTRAVENOUS

## 2024-04-07 MED ORDER — HYDROCODONE-ACETAMINOPHEN 5-325 MG PO TABS
1.0000 | ORAL_TABLET | Freq: Once | ORAL | Status: DC
  Filled 2024-04-07: qty 1

## 2024-04-07 MED ORDER — GABAPENTIN 300 MG PO CAPS
600.0000 mg | ORAL_CAPSULE | Freq: Three times a day (TID) | ORAL | Status: DC
  Administered 2024-04-07 – 2024-04-08 (×4): 600 mg via ORAL
  Filled 2024-04-07 (×7): qty 2

## 2024-04-07 MED ORDER — LACTATED RINGERS IV BOLUS
1000.0000 mL | Freq: Once | INTRAVENOUS | Status: AC
  Administered 2024-04-07: 1000 mL via INTRAVENOUS

## 2024-04-07 NOTE — Progress Notes (Signed)
 Chaplain responded to Level 1 Trauma.  Pt was alert and conscious, speaking in a voice too soft to understand.  Chaplain engaged in ministry with compassion and touch.    Alexis Anger Chaplain

## 2024-04-07 NOTE — H&P (Signed)
 Date: 04/07/2024               Patient Name:  Teresa Wiley MRN: [id number]  DOB: March 13, 1956 Age / Sex: 68 y.o., female   PCP: Otis Blocker, PA         Medical Service: Internal Medicine Teaching Service         Attending Physician: Dr. Priscella Brooms, DO      First Contact: Dr. Carleen Chary, DO     Second Contact: Dr. Jearldine Mina, DO          After Hours (After 5p/  First Contact Pager: [phone]  weekends / holidays): Second Contact Pager: [phone]   SUBJECTIVE   Chief Complaint: fall, syncope  History of Present Illness: Kalee Broxton is a 68 yo female with HTN, pulmonary HTN, prev PE on eliquis , T2DM, OSA, HFpEF, chronic hypoxic respiratory failure on 2 L [place] who presents after a syncopal episode/fall.  The patient states that every time she gets up to walk she has felt dizzy for the last week. This morning she got out of bed to go to the restroom, but she got dizzy and had a syncopal episode. She is not sure if she hit her head and the episode was not witnessed. The patient's wife was asleep when this occurred, and just heard the fall, causing her to call EMS. The patient denies any recent illnesses, fevers, chills, chest pain, SOB, abd pain, n/v/d, or urinary symptoms.  Of note, the patient has a history of recurrent vagal syncope in the past, but states that she has not had an episode in a while. Additionally, I was able to get more history from the patient's wife. The patient's wife states that they have not had much to eat or drink over the last 3 days. It is difficult for her and the patient to walk up and down stairs and to cook, and they have difficulty accessing food because of this.    ED Course: BP 89/60 initially, HR 80s. SpO2 100%.  Cr 1.48.  Hgb 11.1 Trauma CT scans negative for acute abnormalities/fracture  Meds: Confirmed by patient's wife  Eliquis  5 mg BID Xanax  1 mg TID Lipitor 20 mg daily Coreg  6.25 mg BID Benadryl 75-100 mg at bedtime  PRN Lasix 40 mg daily Gabapentin  600 mg TID Oxybutynin  5 mg BID Percocet 10-325 mg QID Ozempic 2 mg/week Protonix  40 mg BID Vitamin D  No outpatient medications have been marked as taking for the 04/07/24 encounter [facility] Encounter).    Past Medical History  The histories are not reviewed yet. Please review them in the "History" navigator section and refresh this SmartLink.  Social:  Lives With: wife and grandson temporarily  Support: family  Level of Function: independent of ADLs, but sometimes needs help with bathing  PCP: Otis Blocker, [place] Substances: No tobacco or alcohol   Family History: CAD, stroke  Allergies: Allergies as of 04/07/2024 - Review Complete 04/07/2024  Allergen Reaction Noted   Shellfish allergy Anaphylaxis 04/07/2024    Review of Systems: A complete ROS was negative except as per HPI.   OBJECTIVE:   Physical Exam: Blood pressure 104/63, pulse 76, temperature 97.7 F (36.5 C), temperature source Tympanic, resp. rate 14, height 5\' 9"  (1.753 m), weight 102.1 kg, SpO2 100%.   Constitutional: well developed, appears uncomfortable but is not in any distress Cardiovascular: regular rate and rhythm, no m/r/g Pulmonary/Chest: normal work of breathing on room air, lungs clear to auscultation anteriorly  Abdominal: soft, non-tender, non-distended MSK: normal bulk and tone Skin: warm and dry Psych: upset   Labs: CBC    Component Value Date/Time   WBC 6.9 04/07/2024 0627   RBC 4.39 04/07/2024 0627   HGB 13.3 04/07/2024 0628   HCT 39.0 04/07/2024 0628   PLT 375 04/07/2024 0627   MCV 87.5 04/07/2024 0627   MCH 25.3 (L) 04/07/2024 0627   MCHC 28.9 (L) 04/07/2024 0627   RDW 13.9 04/07/2024 0627     CMP     Component Value Date/Time   NA 139 04/07/2024 0628   K 4.1 04/07/2024 0628   CL 95 (L) 04/07/2024 0628   CO2 32 04/07/2024 0627   GLUCOSE 104 (H) 04/07/2024 0628   BUN 16 04/07/2024 0628   CREATININE 1.50 (H) 04/07/2024 0628    CALCIUM  9.2 04/07/2024 0627   PROT 7.5 04/07/2024 0627   ALBUMIN 3.5 04/07/2024 0627   AST 13 (L) 04/07/2024 0627   ALT 7 04/07/2024 0627   ALKPHOS 81 04/07/2024 0627   BILITOT 0.3 04/07/2024 0627   GFRNONAA 39 (L) 04/07/2024 0627    Imaging: CT C-spine: IMPRESSION: 1. No acute traumatic injury identified in the cervical spine. 2. Chronic cervical spine degeneration stable from last year.  CT Head: IMPRESSION: No acute intracranial abnormality or acute traumatic injury identified. Stable and negative for age.  CT CAP: IMPRESSION: 1. No acute trauma related findings in the chest, abdomen or pelvis. 2. Aortic atherosclerosis. 3. Chronic enlargement of the pulmonary trunk indicating arterial hypertension. 4. Cardiomegaly with chronic left ventricular apical wall thinning consistent with prior apical infarct. 5. Moderate hiatal hernia. 6. Constipation and diverticulosis. 7. Chronic haziness in the mesenteric root fat. Query fibrosing mesenteritis or mesenteric panniculitis. 8. Umbilical fat hernia and rectus diastasis. 9. Additional chronic findings described above.  EKG: personally reviewed my interpretation is normal sinus rhythm. Prior EKG reviewed and appears unchanged.   ASSESSMENT & PLAN:   Assessment & Plan by Problem: Principal Problem:   Syncope   Jovon Streetman is a 68 y.o. person living with a history of HTN, pulmonary HTN, prev PE on eliquis , T2DM, OSA, HFpEF, chronic hypoxic respiratory failure on 2 L [place] who presented with syncope/fall and admitted for syncope on hospital day 0  Syncopal episode Hx of ASD s/p closure 2023 Has had dizziness and limited PO intake for the last few days and came in hypotensive. I suspect she was orthostatic leading to her syncopal episode, although she does also have a history of vagal syncope. Trauma workup including CT chest abd pelvis, head, and neck were unremarkable for acute injury. She also has a history of an ASD that  was repaired and in appropriate position based on last echo in 2024. - Obtain orthostatics - Tele monitoring - Obtain echocardiogram  - PT/OT  AKI Cr 1.45, up from baseline of ~0.9-1. Likely secondary to poor po intake. She received 1 L IVF in the ED. - Daily BMP - hold lasix   HFpEF Pulmonary HTN Chronic hypoxic respiratory failure on 2L Patient is on her home 2 L [place] currently. - Hold lasix - Continue coreg  6.25 mg bid  Normocytic Anemia Hb 11.1, stable from baseline. No s/s bleeding and CT head was negative for acute traumatic injury.   Diabetes On ozempic 2 mg/week. - Hold ozempic while inpatient - Obtain A1c  Previous PE on Eliquis  Resumed home Eliquis  5 mg bid.   Chronic pain syndrome Opioid dependence Polypharmacy  The patient is on xanax   1 mg TID, baclofen, benadryl at bedtime for sleep, gabapentin  600 mg TID, and percocet 10-325 mg QID. Suspect polypharmacy could also be contributing to her dizziness/orthostasis.  - Resume xanax  to avoid withdrawal - Resumed percocet and gabapentin   - Hold baclofen, benadryl   Diet: Heart Healthy VTE: DOAC IVF: None Family: wife, updated by phone Code: Full  Prior to Admission Living Arrangement: Home, living wife Anticipated Discharge Location: Home Barriers to Discharge: medical stability, syncope workup  Dispo: Admit patient to Observation with expected length of stay less than 2 midnights.  Signed: Maximina Pirozzi N, DO Internal Medicine Resident PGY-3  04/07/2024, 10:32 AM

## 2024-04-07 NOTE — ED Triage Notes (Signed)
 Pt BIB [facility] from home. Pts wife found her on the floor. Pt was going to the bathroom and had syncopal episode, causing her to fall and hit her head; on eliquis . Hematoma to L side of head. Pt also reports decreased PO intake the last few days.   BP on EMS arrival 67 palpated; in route BP 86/57
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
## 2024-04-07 NOTE — Progress Notes (Signed)
 CSW received consult due to food insecurity.   CSW added resources to patient's AVS.  Shepard Dicker, MSW, LCSW Transitions of Care  Clinical Social Worker II [phone]

## 2024-04-07 NOTE — ED Notes (Signed)
 Pt states unable to sit up to take norco.

## 2024-04-07 NOTE — ED Notes (Signed)
 The patient's wife, Suzanna Erp would like an update on her wife. She can be reached [phone]

## 2024-04-07 NOTE — ED Provider Notes (Signed)
  Physical Exam  BP 104/63   Pulse 76   Temp 97.7 F (36.5 C) (Tympanic)   Resp 14   Ht 5\' 9"  (1.753 m)   Wt 102.1 kg   SpO2 100%   BMI 33.23 kg/m   Physical Exam  Procedures  Procedures  ED Course / MDM    Medical Decision Making Amount and/or Complexity of Data Reviewed Labs: ordered. Radiology: ordered.  Risk Prescription drug management. Decision regarding hospitalization.   75F, presented after fall on Eliquis  level 1 trauma, states she was dizzy and LH after getting up to go to bathroom, trauma workup unremarkable, likely will need medical admission for syncope workup.  Patient notably has an AKI with a creatinine of 1.5 from a baseline of 1.  Had soft pressures and route and on arrival, still with mildly low blood pressures.  Will administer fluids.  Hemoglobin appears to be close to baseline at 11.1.  No other significant electrolyte abnormality.  Lactic acid normal.  Trauma workup to include CT chest abdomen pelvis, CT head and cervical spine unremarkable for acute injury.  Patient will need admission for syncope observation and workup, rehydration and recheck of renal function in the setting of AKI.  Medicine consulted for admission, the internal medicine teaching service accepting.  Patient and family were updated bedside and over the phone.     Rosealee Concha, MD 04/07/24 [phone]

## 2024-04-07 NOTE — Discharge Instructions (Addendum)
 [place]  [employer] Address: [address] [place], [place] [address] Phone Number: [phone] Hours of Operation: Residents of [place] can come to obtain food Monday through Friday from 8:30am until 3:30pm. Photo ID and Social Security cards required for all residents of a household. Can come six times a year  [facility] Address: [address] [place], [place] [address] Phone Number: [phone] Hours of Operation: Operates Tuesday-Friday 10:00 a.m. to 1 p.m. Requirements: Referral from DSS needed. May come 6 times a year, 30 days apart. Photo ID and SS required for all residents of household.  [facility] Address: [address] [place], [place] [address] Phone Number: [phone] Hours of Operation: Food pantry is open on the last Saturday of each month from 10:00 am - 12:00 noon. No appointment needed. No qualifications.  [facility] Address: [address] [place], [place] [address] Phone Number: [phone]. 21 Hours of Operation: Must make reservations to pick up food on Saturdays. Sign ups for Saturday pick up beginning at 8:30 a.m. on Monday morning.  [facility] Address: [address] [place], [place] [address] Phone Number: [phone] Hours of Operation: If you need food, bring proper identification such as a driver's license to receive a bag of food once a month. Requirements: Can come once every 30 days with referral DSS, Holiday representative, Mental health etc. Each referral good for six visits. Photo ID required. *1st visit no referral required.  [facility] Address: [address] [place], [place] [address] Phone Number: [phone]  [facility] [place] [facility] Address: [address] [place], [place] [address] Phone Number: [phone] Hours of Operation:  You can register at https://gatecityvineyard.[email or web address]/ for free groceries  [employer]  [facility] Address: [address] [place], [place] [address] Phone Number: [phone] Hours of Operation: Drive through giveaway, first come first served. Every 3rd Saturday 11AM - 1PM  [facility] [address] Address: [address] [place] [address] Phone Number: [phone]   [place]  [employer] [facility] [employer] [facility] Address: [address] [place], [place] [address] Phone Number: [phone] Hours of Operation: Once a month every 3rd Saturday  [facility] Address: [address] [place], [place] [address] Phone Number: [phone] Hours of Operation: Distribution happens from 9:00-10:00 a.m. every Saturday.     [facility] Address: [address] [place], [place] [address] Phone Number: [phone] Hours of Operation: ONCE a week for the community food distribution held every Tuesday, Wednesday and Thursday from 11 a.m. - 2:00 p.m. Food is available on a first come, first serve basis and varies week to week. No appointment necessary for drive thru pick up.  [facility] Address: [address] [place], [place] [address] Phone Number: [phone] Hours of Operation: Open every 3rd Thursday 9:30 a.m. - 11:00 a.m.  [facility] Address: [address] [place], [place] [address] Phone Number: [phone] Hours of Operation: Please call for hours, directions, and questions  [facility] [place] [facility] Address: [address], [place], [place]  [address] Phone Number: [phone] Website: https://www.Hollyguns.co.za Food Finder app: https://findfood.ghpfa.org  [employer] Address: [address] [place], [place] [address] Phone Number: [phone] Hours of Operation: Contact Bree Harpe. Enrolled Substance Abuse Clients Only  [facility] Address: [address] [place], [address]  Phone Number: [phone] Hours of Operation: Contact Clyde Lundborg. Food pantry open the 3rd Saturday of each month  from 9 a.m. -12 p.m. only  [place] [facility] Address: [address] [place], [place] [address] Phone Number: [phone] Hours of Operation: Contact Juanda Crumble. Emergency food bank open on Saturdays by appointment only  [facility] Address: [address] [place], [place] [address] Phone Number: [phone] Hours of Operation: No specific contact person; Anyone can help  [facility] Address: [address] [place], [place] [address] Phone Number: [phone] Hours of Operation: Contact Carney Bern. Agency gives out a bag of food every Thursday from 2-4 p.m. only, and also provides a community meal every Thursday between 5-6 p.m. Other services provided include rent/mortgage and utility assistance, women's winter shelter, thrift store, and senior adult activities.  [facility] [place] Address: [address] [place], [place] [address] Phone Number: [phone] Hours of Operation: The Emergency Food Assistance Program provides individuals and families with a generous supply of food including meat, fresh vegetables, and nonperishable items. The food box contains five days' worth of food, and each family or individual can receive a box once per month. M, W, Th, Fr 11am-2pm, walk-ins welcome.  [facility] Address: [address] [place], [place] [address]  Phone Number: [phone] Hours of Operation: Contact Asia Scarlette Calico. Tuesdays and Thursdays from 11am - 3pm by appointment only
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
## 2024-04-07 NOTE — Progress Notes (Signed)
 Orthopedic Tech Progress Note Patient Details:  Teresa Wiley 1956/03/20 [id number]  Patient ID: Teresa Wiley, female   DOB: May 18, 1956, 68 y.o.   MRN: [id number] Level I; not currently needed. Toi Foster 04/07/2024, 6:43 AM

## 2024-04-08 DIAGNOSIS — Z79899 Other long term (current) drug therapy: Secondary | ICD-10-CM

## 2024-04-08 DIAGNOSIS — E119 Type 2 diabetes mellitus without complications: Secondary | ICD-10-CM

## 2024-04-08 DIAGNOSIS — F1120 Opioid dependence, uncomplicated: Secondary | ICD-10-CM

## 2024-04-08 DIAGNOSIS — I272 Pulmonary hypertension, unspecified: Secondary | ICD-10-CM

## 2024-04-08 DIAGNOSIS — I5030 Unspecified diastolic (congestive) heart failure: Secondary | ICD-10-CM | POA: Diagnosis not present

## 2024-04-08 DIAGNOSIS — D649 Anemia, unspecified: Secondary | ICD-10-CM

## 2024-04-08 DIAGNOSIS — R55 Syncope and collapse: Secondary | ICD-10-CM | POA: Diagnosis not present

## 2024-04-08 DIAGNOSIS — N179 Acute kidney failure, unspecified: Secondary | ICD-10-CM | POA: Diagnosis not present

## 2024-04-08 DIAGNOSIS — Z7901 Long term (current) use of anticoagulants: Secondary | ICD-10-CM

## 2024-04-08 DIAGNOSIS — Z86711 Personal history of pulmonary embolism: Secondary | ICD-10-CM

## 2024-04-08 DIAGNOSIS — J9611 Chronic respiratory failure with hypoxia: Secondary | ICD-10-CM

## 2024-04-08 LAB — BASIC METABOLIC PANEL WITH GFR
Anion gap: 13 (ref 5–15)
BUN: 11 mg/dL (ref 8–23)
CO2: 22 mmol/L (ref 22–32)
Calcium: 8.8 mg/dL — ABNORMAL LOW (ref 8.9–10.3)
Chloride: 102 mmol/L (ref 98–111)
Creatinine, Ser: 0.89 mg/dL (ref 0.44–1.00)
GFR, Estimated: >60 mL/min (ref >60)
Glucose, Bld: 75 mg/dL (ref 70–99)
Potassium: 5 mmol/L (ref 3.5–5.1)
Sodium: 137 mmol/L (ref 135–145)

## 2024-04-08 LAB — HEMOGLOBIN A1C
Hgb A1c MFr Bld: 4.8 % (ref 4.8–5.6)
Mean Plasma Glucose: 91.06 mg/dL

## 2024-04-08 LAB — CBC
HCT: 34.9 % — ABNORMAL LOW (ref 36.0–46.0)
Hemoglobin: 10.4 g/dL — ABNORMAL LOW (ref 12.0–15.0)
MCH: 25.4 pg — ABNORMAL LOW (ref 26.0–34.0)
MCHC: 29.8 g/dL — ABNORMAL LOW (ref 30.0–36.0)
MCV: 85.3 fL (ref 80.0–100.0)
Platelets: 258 10*3/uL (ref 150–400)
RBC: 4.09 MIL/uL (ref 3.87–5.11)
RDW: 13.7 % (ref 11.5–15.5)
WBC: 4.7 10*3/uL (ref 4.0–10.5)
nRBC: 0 % (ref 0.0–0.2)

## 2024-04-08 LAB — HIV ANTIBODY (ROUTINE TESTING W REFLEX): HIV Screen 4th Generation wRfx: NONREACTIVE

## 2024-04-08 MED ORDER — ATORVASTATIN CALCIUM 20 MG PO TABS: 20.0000 mg | ORAL_TABLET | Freq: Every day | ORAL | Status: DC

## 2024-04-08 MED ORDER — PANTOPRAZOLE SODIUM 40 MG PO TBEC: 40.0000 mg | DELAYED_RELEASE_TABLET | Freq: Every day | ORAL | Status: AC

## 2024-04-08 MED ORDER — LIDOCAINE 5 % EX PTCH
1.0000 | MEDICATED_PATCH | CUTANEOUS | Status: DC
  Administered 2024-04-08: 1 via TRANSDERMAL
  Filled 2024-04-08: qty 1

## 2024-04-08 NOTE — Discharge Summary (Signed)
 Name: Teresa Wiley MRN: [id number] DOB: 09-23-1956 68 y.o. PCP: Otis Blocker, PA  Date of Admission: 04/07/2024  6:10 AM Date of Discharge:  04/08/2024 Attending Physician: Dr. Bettejane Brownie  DISCHARGE DIAGNOSIS:  Primary Problem: Syncope   Hospital Problems: Principal Problem:   Syncope  1. Syncope and fall 2. AKI 3. HFpEF 4. Pulmonary HTN 5. Chronic hypoxic respiratory failure 6. Normocytic anemia 7. Type 2 diabetes 8. PE on Eliquis  9. Chronic pain syndrome 10. Opioid dependence 11. Polypharmacy  DISCHARGE MEDICATIONS:   Allergies as of 04/08/2024       Reactions   Shellfish Allergy Anaphylaxis   Butrans [buprenorphine] Other (See Comments)   Patient stated the first time she used the patch she was talking to family just fine and the next thing she knew "they were standing over me like I was dying"        Medication List     STOP taking these medications    baclofen 10 MG tablet Commonly known as: LIORESAL   cyclobenzaprine 10 MG tablet Commonly known as: FLEXERIL   dexlansoprazole 60 MG capsule Commonly known as: DEXILANT       TAKE these medications    ALPRAZolam  1 MG tablet Commonly known as: XANAX  Take 1 mg by mouth as needed for anxiety.   atorvastatin  20 MG tablet Commonly known as: LIPITOR Take 1 tablet (20 mg total) by mouth daily.   carvedilol  6.25 MG tablet Commonly known as: COREG  Take 6.25 mg by mouth 2 (two) times daily.   Eliquis  5 MG Tabs tablet Generic drug: apixaban  Take 5 mg by mouth 2 (two) times daily.   furosemide 40 MG tablet Commonly known as: LASIX Take 40 mg by mouth daily.   gabapentin  600 MG tablet Commonly known as: NEURONTIN  Take 600 mg by mouth 3 (three) times daily.   oxybutynin  5 MG tablet Commonly known as: DITROPAN  Take 5 mg by mouth 2 (two) times daily.   oxyCODONE -acetaminophen  10-325 MG tablet Commonly known as: PERCOCET Take 1 tablet by mouth in the morning, at noon, in the evening, and  at bedtime.   pantoprazole  40 MG tablet Commonly known as: PROTONIX  Take 1 tablet (40 mg total) by mouth daily. What changed: when to take this   Semaglutide (2 MG/DOSE) 8 MG/3ML Sopn Inject 2 mg into the skin once a week. On Thursday   Vitamin D (Ergocalciferol) 1.25 MG (50000 UNIT) Caps capsule Commonly known as: DRISDOL Take 50,000 Units by mouth once a week. On Wednesday        DISPOSITION AND FOLLOW-UP:  Teresa Wiley was discharged from [facility] in Stable condition. At the hospital follow up visit please address:  Syncope: likely orthostatic in nature Polypharmacy: contributing to dizziness, syncopal episode/falls, as well as memory lapses. Please address centrally acting medications Patient was advised to STOP taking benadryl/sleep aid OTC Patient is interested in a referral to neurology for dementia workup, although we advised that she should be tapered off some of these medications prior to this, as they are contributing to her symptoms  Follow-up Recommendations: Consults: None  Labs: None Studies: None Discontinued Medications: Benadryl  Follow-up Appointments:  Follow-up Information     [facility]  Follow up.   Why: [facility] will provide home health services.  The home health agency will call you in the next 24-48 hours to set up services. Contact information: [address] [place] [address] [phone]  HOSPITAL COURSE:  Patient Summary: Teresa Wiley is a 67 yo female with HTN, pulmonary HTN, prev PE on eliquis , T2DM, OSA, HFpEF, chronic hypoxic respiratory failure on 2 L [place] who presents after a syncopal episode/fall.   Syncopal episode Hx of ASD s/p closure 2023 Patient presented with limited PO intake over the last few days in the setting of difficulty accessing food, as well as dizziness. Her dizziness/orthostasis was secondary to her poor PO intake,  as well as her multiple centrally acting medications that she takes. She is on xanax  1 mg TID, baclofen, benadryl, gabapentin  600 mg TID, and percocet 10-325 mg QID. No arrhythmias were noted on telemetry and her echocardiogram was unremarkable. Trauma workup including CT chest abd pelvis, head, and neck were unremarkable for acute injury. The patient received IVF and worked with PT/OT, who recommended home health for the patient. We also advise she follow up with her PCP to discuss tapering some of her centrally acting meds, as these are contributing to her dizziness and balance issues, as well as the memory lapses that she mentioned to us .   AKI Cr 1.45, up from baseline of ~0.9-1.  Likely secondary to poor po intake. She received 1 L IVF in the ED and AKI resolved by the time of discharge.   HFpEF Pulmonary HTN Chronic hypoxic respiratory failure on 2L Patient is on her home 2 L [place] currently. Resumed home coreg  and lasix at discharge.  Normocytic Anemia Hb stable from baseline. No s/s bleeding and CT head was negative for acute traumatic injury.   Diabetes On ozempic 2 mg/week at home. A1c 4.8%.   Previous PE on Eliquis  Resumed home Eliquis  5 mg bid.    DISCHARGE INSTRUCTIONS:   Discharge Instructions     Diet general   Complete by: As directed    Discharge instructions   Complete by: As directed    You were hospitalized due to a fall and blackout. This was due to multiple things:  First: low blood pressure promotes falls. To address this, stay hydrated and stand up slowly. If you feel dizzy when standing, sit down and have something to eat and drink. Do not go multiple days without limited meals.  Second: medication combinations promote falls. Your medicines that can cause this are xanax /alprazolam , gabapentin , baclofen, and benadryl/diphenhydramine. Please stop baclofen and benadryl (including "Sleep Aid" altogether. For sleep help, try 5mg  of melatonin  Please call your primary  doctor to set up a hospital follow-up appointment. It would be wise to reduce your dose of xanax .   Increase activity slowly   Complete by: As directed        SUBJECTIVE:  Patient seen at the bedside this morning. She was about to work with OT and had worked with PT already. She is concerned about her memory and falls, and we discussed how her medications are contributing to this.   Discharge Vitals:   BP 113/64 (BP Location: Left Arm)   Pulse 85   Temp 98.2 F (36.8 C)   Resp 18   Ht 5\' 9"  (1.753 m)   Wt 102.1 kg   SpO2 92%   BMI 33.23 kg/m   OBJECTIVE:  General: Pleasant, well-appearing woman laying in bed. No acute distress. CV: RRR. No murmurs, rubs, or gallops. No LE edema Pulmonary: Lungs CTAB. Normal effort. No wheezing or rales. Abdominal: Soft, nontender, nondistended. Normal bowel sounds. Extremities: Palpable radial and DP pulses. Normal ROM. Skin: Warm and dry. No obvious rash or lesions.  Neuro: A&Ox3. Moves all extremities. Normal sensation to light touch. No focal deficit. Psych: Normal mood and affect  Pertinent Labs, Studies, and Procedures:     Latest Ref Rng & Units 04/08/2024    6:37 AM 04/07/2024    6:28 AM 04/07/2024    6:27 AM  CBC  WBC 4.0 - 10.5 K/uL 4.7   6.9   Hemoglobin 12.0 - 15.0 g/dL 40.9  81.1  91.4   Hematocrit 36.0 - 46.0 % 34.9  39.0  38.4   Platelets 150 - 400 K/uL 258   375        Latest Ref Rng & Units 04/08/2024    6:37 AM 04/07/2024    6:28 AM 04/07/2024    6:27 AM  CMP  Glucose 70 - 99 mg/dL 75  782  956   BUN 8 - 23 mg/dL 11  16  13    Creatinine 0.44 - 1.00 mg/dL 2.13  0.86  5.78   Sodium 135 - 145 mmol/L 137  139  139   Potassium 3.5 - 5.1 mmol/L 5.0  4.1  4.2   Chloride 98 - 111 mmol/L 102  95  97   CO2 22 - 32 mmol/L 22   32   Calcium  8.9 - 10.3 mg/dL 8.8   9.2   Total Protein 6.5 - 8.1 g/dL   7.5   Total Bilirubin 0.0 - 1.2 mg/dL   0.3   Alkaline Phos 38 - 126 U/L   81   AST 15 - 41 U/L   13   ALT 0 - 44 U/L   7      ECHOCARDIOGRAM COMPLETE Result Date: 04/07/2024    ECHOCARDIOGRAM REPORT   Patient Name:   Teresa Wiley Date of Exam: 04/07/2024 Medical Rec #:  [id number]        Height:       69.0 in Accession #:    [id number]       Weight:       225.0 lb Date of Birth:  [birth year]         BSA:          2.172 m Patient Age:    68 years         BP:           111/76 mmHg Patient Gender: F                HR:           78 bpm. Exam Location:  Inpatient Procedure: 2D Echo, Cardiac Doppler and Color Doppler (Both Spectral and Color            Flow Doppler were utilized during procedure). Indications:    Syncope  History:        Patient has no prior history of Echocardiogram examinations.  Sonographer:    Janette Medley Referring Phys: RAYANN N ATWAY IMPRESSIONS  1. Left ventricular ejection fraction, by estimation, is 60 to 65%. The left ventricle has normal function. The left ventricle has no regional wall motion abnormalities. Left ventricular diastolic parameters are consistent with Grade I diastolic dysfunction (impaired relaxation).  2. Right ventricular systolic function is normal. The right ventricular size is normal.  3. Left atrial size was mildly dilated.  4. The mitral valve is normal in structure. Trivial mitral valve regurgitation. No evidence of mitral stenosis.  5. The aortic valve is normal in structure. Aortic valve regurgitation is trivial. No aortic stenosis is present.  6. Aortic dilatation  noted. There is borderline dilatation of the ascending aorta, measuring 38 mm.  7. The inferior vena cava is normal in size with greater than 50% respiratory variability, suggesting right atrial pressure of 3 mmHg. FINDINGS  Left Ventricle: Left ventricular ejection fraction, by estimation, is 60 to 65%. The left ventricle has normal function. The left ventricle has no regional wall motion abnormalities. The left ventricular internal cavity size was normal in size. There is  no left ventricular hypertrophy. Left ventricular  diastolic parameters are consistent with Grade I diastolic dysfunction (impaired relaxation). Right Ventricle: The right ventricular size is normal. No increase in right ventricular wall thickness. Right ventricular systolic function is normal. Left Atrium: Left atrial size was mildly dilated. Right Atrium: Right atrial size was normal in size. Pericardium: There is no evidence of pericardial effusion. Mitral Valve: The mitral valve is normal in structure. Trivial mitral valve regurgitation. No evidence of mitral valve stenosis. Tricuspid Valve: The tricuspid valve is normal in structure. Tricuspid valve regurgitation is trivial. No evidence of tricuspid stenosis. Aortic Valve: The aortic valve is normal in structure. Aortic valve regurgitation is trivial. No aortic stenosis is present. Pulmonic Valve: The pulmonic valve was normal in structure. Pulmonic valve regurgitation is trivial. No evidence of pulmonic stenosis. Aorta: Aortic dilatation noted. There is borderline dilatation of the ascending aorta, measuring 38 mm. Venous: The inferior vena cava is normal in size with greater than 50% respiratory variability, suggesting right atrial pressure of 3 mmHg. IAS/Shunts: No atrial level shunt detected by color flow Doppler.  LEFT VENTRICLE PLAX 2D LVIDd:         4.10 cm   Diastology LVIDs:         2.80 cm   LV e' medial:    8.49 cm/s LV PW:         1.00 cm   LV E/e' medial:  9.2 LV IVS:        1.00 cm   LV e' lateral:   8.38 cm/s LVOT diam:     2.00 cm   LV E/e' lateral: 9.3 LV SV:         78 LV SV Index:   36 LVOT Area:     3.14 cm  RIGHT VENTRICLE             IVC RV S prime:     14.10 cm/s  IVC diam: 1.80 cm TAPSE (M-mode): 3.2 cm LEFT ATRIUM             Index        RIGHT ATRIUM           Index LA diam:        2.80 cm 1.29 cm/m   RA Area:     19.30 cm LA Vol (A2C):   74.1 ml 34.12 ml/m  RA Volume:   49.50 ml  22.79 ml/m LA Vol (A4C):   54.6 ml 25.14 ml/m LA Biplane Vol: 64.2 ml 29.56 ml/m  AORTIC VALVE LVOT  Vmax:   123.00 cm/s LVOT Vmean:  93.500 cm/s LVOT VTI:    0.249 m  AORTA Ao Root diam: 2.40 cm Ao Asc diam:  3.85 cm MITRAL VALVE MV Area (PHT): 3.65 cm    SHUNTS MV Decel Time: 208 msec    Systemic VTI:  0.25 m MV E velocity: 78.10 cm/s  Systemic Diam: 2.00 cm MV A velocity: 92.20 cm/s MV E/A ratio:  0.85 Jules Oar MD Electronically signed by Jules Oar MD Signature Date/Time: 04/07/2024/6:01:03  PM    Final    CT CHEST ABDOMEN PELVIS W CONTRAST Result Date: 04/07/2024 CLINICAL DATA:  Marvell Slider in the bathroom at home and impacted the left side, in the emergency room had hypotension. Concern is for retroperitoneal bleed or other acute trauma related issue. EXAM: CT CHEST, ABDOMEN, AND PELVIS WITH CONTRAST TECHNIQUE: Multidetector CT imaging of the chest, abdomen and pelvis was performed following the standard protocol during bolus administration of intravenous contrast. RADIATION DOSE REDUCTION: This exam was performed according to the departmental dose-optimization program which includes automated exposure control, adjustment of the mA and/or kV according to patient size and/or use of iterative reconstruction technique. CONTRAST:  75mL OMNIPAQUE  IOHEXOL  350 MG/ML SOLN COMPARISON:  CTA chest, abdomen and pelvis studies dated 02/01/2024 and 06/24/2022. FINDINGS: CT CHEST FINDINGS Cardiovascular: The heart is slightly enlarged with no pericardial effusion or visible coronary calcification. There is metal artifact from an atrial septal repair. There is chronic left ventricular apical wall thinning consistent with prior apical infarct. There is mild atherosclerosis and tortuosity of the aorta, unremarkable great vessels and no aneurysm, stenosis or dissection. Chronic enlargement of pulmonary trunk 3.4 cm indicating arterial hypertension. There is no central embolus on this nondedicated study. Pulmonary veins are normal. Mediastinum/Nodes: No adenopathy, mass, pneumomediastinum or mediastinal fluid  collection. Moderate-sized hiatal hernia with unremarkable thoracic esophagus and thoracic trachea. Axillary spaces are clear. The lower poles of the thyroid gland unremarkable. Lungs/Pleura: Chronic subpleural lipomatosis in the lower chest. There is no pleural effusion, thickening or pneumothorax. There are chronic linear scar-like markings in the anterior and posterior segments of the right upper lobe, chronic basilar linear scarring or atelectasis both lower lobes and right middle lobe. There is no acute pulmonary consolidation and there is no suspicious nodule. There is asymmetric elevation right hemidiaphragm also chronic. Musculoskeletal: No acute regional skeletal fracture. No chest wall hematoma or mass. CT ABDOMEN PELVIS FINDINGS Hepatobiliary: No hepatic injury or perihepatic hematoma. Gallbladder is absent. The liver is mildly steatotic. No mass is seen. There is chronic post cholecystectomy common bile duct dilatation unchanged, 12 mm with mild central intrahepatic biliary prominence also stable. Pancreas: Partially atrophic.  Otherwise unremarkable. Spleen: No mass, splenomegaly or acute splenic injury are seen. Adrenals/Urinary Tract: No adrenal hemorrhage or renal injury identified. Bladder is unremarkable. There is no adrenal or renal mass, no urinary stone or obstruction. Stomach/Bowel: Moderate hiatal hernia. Unremarkable stomach wall. No bowel dilatation or wall thickening. The normal appendix. Moderate fecal stasis. Scattered sigmoid diverticula without diverticulitis. Vascular/Lymphatic: Aortic atherosclerosis. No enlarged abdominal or pelvic lymph nodes. Reproductive: Uterus and bilateral adnexa are unremarkable. Other: Chronic haziness in the mesenteric root fat. No associated adenopathy. Query fibrosing mesenteritis or mesenteric panniculitis. Small umbilical fat hernia and umbilical level rectus diastasis. No incarcerated hernia. Multiple phleboliths in the pelvis. No free hemorrhage, free  fluid or free air, or mesenteric contusions. Musculoskeletal: No regional skeletal fracture is seen. Vertebral body hemangiomas are again noted at T11, T12, and 2. Advanced degenerative disc disease seen with grade 1 retrolisthesis chronically L5-S1. IMPRESSION: 1. No acute trauma related findings in the chest, abdomen or pelvis. 2. Aortic atherosclerosis. 3. Chronic enlargement of the pulmonary trunk indicating arterial hypertension. 4. Cardiomegaly with chronic left ventricular apical wall thinning consistent with prior apical infarct. 5. Moderate hiatal hernia. 6. Constipation and diverticulosis. 7. Chronic haziness in the mesenteric root fat. Query fibrosing mesenteritis or mesenteric panniculitis. 8. Umbilical fat hernia and rectus diastasis. 9. Additional chronic findings described above. 10. Results were  called by telephone at the time of interpretation (LEVEL 1 TRAUMA PATIENT) on 04/07/2024 at 6:49 am to provider DR. STECHSCHULTE, who verbally acknowledged these results. Aortic Atherosclerosis (ICD10-I70.0). Electronically Signed   By: Denman Fischer M.D.   On: 04/07/2024 07:17   CT Cervical Spine Wo Contrast Result Date: 04/07/2024 CLINICAL DATA:  68 year old female status post trauma. EXAM: CT CERVICAL SPINE WITHOUT CONTRAST TECHNIQUE: Multidetector CT imaging of the cervical spine was performed without intravenous contrast. Multiplanar CT image reconstructions were also generated. RADIATION DOSE REDUCTION: This exam was performed according to the departmental dose-optimization program which includes automated exposure control, adjustment of the mA and/or kV according to patient size and/or use of iterative reconstruction technique. COMPARISON:  Head CT today reported separately. Cervical spine CT 09/23/2023. FINDINGS: Alignment: Chronic straightening, mildly improved cervical lordosis since last year. Cervicothoracic junction alignment is within normal limits. Bilateral posterior element alignment is  within normal limits. Skull base and vertebrae: Bone mineralization is within normal limits. Visualized skull base is intact. No atlanto-occipital dissociation. C1 and C2 appear intact and aligned. No acute osseous abnormality identified. Soft tissues and spinal canal: No prevertebral fluid or swelling. No visible canal hematoma. Negative visible noncontrast neck soft tissues. Disc levels: Chronic severe facet arthropathy at the left cervicothoracic junction. Chronic cervical disc and endplate degeneration most pronounced at C4-C5 and C5-C6, stable. Upper chest: Lung apices appear clear. Chest CT is reported separately today. IMPRESSION: 1. No acute traumatic injury identified in the cervical spine. 2. Chronic cervical spine degeneration stable from last year. Electronically Signed   By: Marlise Simpers M.D.   On: 04/07/2024 06:46   CT HEAD WO CONTRAST Result Date: 04/07/2024 CLINICAL DATA:  68 year old female status post trauma. EXAM: CT HEAD WITHOUT CONTRAST TECHNIQUE: Contiguous axial images were obtained from the base of the skull through the vertex without intravenous contrast. RADIATION DOSE REDUCTION: This exam was performed according to the departmental dose-optimization program which includes automated exposure control, adjustment of the mA and/or kV according to patient size and/or use of iterative reconstruction technique. COMPARISON:  Head CT 02/01/2024 and brain MRI 01/11/2016. FINDINGS: Brain: Cerebral volume remains normal. No midline shift, ventriculomegaly, mass effect, evidence of mass lesion, intracranial hemorrhage or evidence of cortically based acute infarction. Gray-white matter differentiation is within normal limits throughout the brain. Vascular: No suspicious intracranial vascular hyperdensity. Skull: Stable.  No fracture identified. Sinuses/Orbits: Visualized paranasal sinuses and mastoids are stable and well aerated. Other: No discrete orbit or scalp soft tissue injury. Absent dentition.  Grossly negative visible noncontrast face. IMPRESSION: No acute intracranial abnormality or acute traumatic injury identified. Stable and negative for age. Electronically Signed   By: Marlise Simpers M.D.   On: 04/07/2024 06:44     Signed: Carleen Chary, DO Internal Medicine Resident, PGY-1 [facility]  1:27 PM, 04/08/2024

## 2024-04-08 NOTE — Hospital Course (Addendum)
 Teresa Wiley is a 67 yo female with HTN, pulmonary HTN, prev PE on eliquis , T2DM, OSA, HFpEF, chronic hypoxic respiratory failure on 2 L [place] who presents after a syncopal episode/fall.   Syncopal episode Hx of ASD s/p closure 2023 Patient presented with limited PO intake over the last few days in the setting of difficulty accessing food, as well as dizziness. Her dizziness/orthostasis was secondary to her poor PO intake, as well as her multiple centrally acting medications that she takes. She is on xanax  1 mg TID, baclofen, benadryl, gabapentin  600 mg TID, and percocet 10-325 mg QID. No arrhythmias were noted on telemetry and her echocardiogram was unremarkable. Trauma workup including CT chest abd pelvis, head, and neck were unremarkable for acute injury. The patient received IVF and worked with PT/OT, who recommended home health for the patient. We also advise she follow up with her PCP to discuss tapering some of her centrally acting meds, as these are contributing to her dizziness and balance issues, as well as the memory lapses that she mentioned to us .   AKI Cr 1.45, up from baseline of ~0.9-1.  Likely secondary to poor po intake. She received 1 L IVF in the ED and AKI resolved by the time of discharge.   HFpEF Pulmonary HTN Chronic hypoxic respiratory failure on 2L Patient is on her home 2 L  currently. Resumed home coreg  and lasix at discharge.  Normocytic Anemia Hb stable from baseline. No s/s bleeding and CT head was negative for acute traumatic injury.   Diabetes On ozempic 2 mg/week at home. A1c 4.8%.   Previous PE on Eliquis  Resumed home Eliquis  5 mg bid.

## 2024-04-08 NOTE — Plan of Care (Signed)

## 2024-04-08 NOTE — Evaluation (Signed)
 Physical Therapy Evaluation Patient Details Name: Teresa Wiley MRN: [id number] DOB: [birth year] Today's Date: 04/08/2024  History of Present Illness  68 yo female admitted 04/07/24 with syncope. PMhx: HTN, pulmonary HTN, PE on eliquis , T2DM, OSA, HFpEF, chronic hypoxic respiratory failure on 2 L [place]  Clinical Impression  Pt labile on arrival stating she has back pain and is worried about falls. Pt initially stating she doesn't use DME then stated she only uses rollator when cooking but not regular walking despite recent falls. No family present to confirm PLOF or assist at D/C. Pt would benefit from single floor housing and possibly ALF for long term arrangement due to pt stating she and wife both have mobility struggles. Pt with decreased balance, transfers and gait who will benefit from acute therapy to maximize mobility, safety and independence.         If plan is discharge home, recommend the following: A little help with bathing/dressing/bathroom;Assistance with cooking/housework;Assist for transportation;Help with stairs or ramp for entrance   Can travel by private vehicle        Equipment Recommendations None recommended by PT  Recommendations for Other Services  OT consult    Functional Status Assessment Patient has had a recent decline in their functional status and demonstrates the ability to make significant improvements in function in a reasonable and predictable amount of time.     Precautions / Restrictions Precautions Precautions: Fall      Mobility  Bed Mobility Overal bed mobility: Modified Independent                  Transfers Overall transfer level: Needs assistance   Transfers: Sit to/from Stand Sit to Stand: Contact guard assist           General transfer comment: cues for hand placement and safety, pt with increased time to stabilize and transition hand from bed to RW    Ambulation/Gait Ambulation/Gait assistance: Contact guard  assist Gait Distance (Feet): 75 Feet Assistive device: Rolling walker (2 wheels) Gait Pattern/deviations: Step-through pattern, Decreased stride length   Gait velocity interpretation: <1.8 ft/sec, indicate of risk for recurrent falls   General Gait Details: cues for proximity and safety, limited by O2 tubing (no carrier on unit) and fatigue  Stairs            Wheelchair Mobility     Tilt Bed    Modified Rankin (Stroke Patients Only)       Balance Overall balance assessment: History of Falls                                           Pertinent Vitals/Pain Pain Assessment Pain Assessment: 0-10 Pain Score: 8  Pain Location: low back pain Pain Descriptors / Indicators: Aching, Constant Pain Intervention(s): Limited activity within patient's tolerance, Monitored during session, Repositioned    Home Living Family/patient expects to be discharged to:: Private residence Living Arrangements: Spouse/significant other Available Help at Discharge: Family;Available 24 hours/day Type of Home: House Home Access: Stairs to enter   [employer] of Steps: 2   Home Layout: Two level;Bed/bath upstairs Home Equipment: Rollator (4 wheels);Cane - single point      Prior Function Prior Level of Function : History of Falls (last six months);Independent/Modified Independent             Mobility Comments: reports she only uses rollator when cooking despite falls, has  to have assist to get up from falls ADLs Comments: states struggling with stairs and meal prep     Extremity/Trunk Assessment   Upper Extremity Assessment Upper Extremity Assessment: Generalized weakness    Lower Extremity Assessment Lower Extremity Assessment: Generalized weakness    Cervical / Trunk Assessment Cervical / Trunk Assessment: Normal  Communication   Communication Communication: No apparent difficulties    Cognition Arousal: Alert Behavior During Therapy:  Lability   PT - Cognitive impairments: No family/caregiver present to determine baseline, Memory, Safety/Judgement, Problem solving                       PT - Cognition Comments: pt with varied hx of function and home setup Following commands: Impaired Following commands impaired: Follows one step commands with increased time     Cueing Cueing Techniques: Verbal cues, Gestural cues     General Comments      Exercises     Assessment/Plan    PT Assessment Patient needs continued PT services  PT Problem List Decreased activity tolerance;Decreased balance;Decreased mobility;Decreased safety awareness;Decreased knowledge of use of DME;Decreased cognition       PT Treatment Interventions DME instruction;Gait training;Stair training;Functional mobility training;Therapeutic activities;Therapeutic exercise;Patient/family education;Balance training;Cognitive remediation    PT Goals (Current goals can be found in the Care Plan section)  Acute Rehab PT Goals Patient Stated Goal: return home PT Goal Formulation: With patient Time For Goal Achievement: 04/22/24 Potential to Achieve Goals: Good    Frequency Min 2X/week     Co-evaluation               AM-PAC PT "6 Clicks" Mobility  Outcome Measure Help needed turning from your back to your side while in a flat bed without using bedrails?: A Little Help needed moving from lying on your back to sitting on the side of a flat bed without using bedrails?: A Little Help needed moving to and from a bed to a chair (including a wheelchair)?: A Little Help needed standing up from a chair using your arms (e.g., wheelchair or bedside chair)?: A Little Help needed to walk in hospital room?: A Little Help needed climbing 3-5 steps with a railing? : A Little 6 Click Score: 18    End of Session Equipment Utilized During Treatment: Gait belt;Oxygen Activity Tolerance: Patient tolerated treatment well Patient left: in chair;with  call bell/phone within reach;with chair alarm set Nurse Communication: Mobility status PT Visit Diagnosis: Other abnormalities of gait and mobility (R26.89);Difficulty in walking, not elsewhere classified (R26.2);History of falling (Z91.81)    Time: 1610-9604 PT Time Calculation (min) (ACUTE ONLY): 19 min   Charges:   PT Evaluation $PT Eval Moderate Complexity: 1 Mod   PT General Charges $$ ACUTE PT VISIT: 1 Visit         Annis Baseman, PT Acute Rehabilitation Services Office: [phone]   Jackey Mary Sumayah Bearse 04/08/2024, 8:59 AM

## 2024-04-08 NOTE — Evaluation (Signed)
 Occupational Therapy Evaluation Patient Details Name: Teresa Wiley MRN: [id number] DOB: 03/28/1956 Today's Date: 04/08/2024   History of Present Illness   68 yo female admitted 04/07/24 with syncope. PMhx: HTN, pulmonary HTN, PE on eliquis , T2DM, OSA, HFpEF, chronic hypoxic respiratory failure on 2 L [place]     Clinical Impressions Pt presents with decline in function and safety with ADLs and ADL mobility with impaired balance, endurance and pt reports that she has difficulty with her memory at baseline and expresses that she feels she has dementia. PTA pt lived with her spouse and grandson and was Ind with ADLs, used Product manager for mobility, cooks and wears home O2/ Pt reports that she removes home O2 when she is cooking and lives on second level of her home. Educated pt extensively on fall prevention with environmental modifications. Pt currently requires CGA/Sup with LB ADLs and ADL mobility using RW and has difficulty remembering what meds she takes at home. Pt would benefit from acute OT services to address impairments to maximize level of function and safety     If plan is discharge home, recommend the following:   Assistance with cooking/housework;Direct supervision/assist for medications management;Assist for transportation;Help with stairs or ramp for entrance;Direct supervision/assist for financial management     Functional Status Assessment   Patient has had a recent decline in their functional status and demonstrates the ability to make significant improvements in function in a reasonable and predictable amount of time.     Equipment Recommendations   None recommended by OT     Recommendations for Other Services         Precautions/Restrictions   Precautions Precautions: Fall Restrictions Weight Bearing Restrictions Per Provider Order: No     Mobility Bed Mobility Overal bed mobility: Modified Independent                  Transfers Overall transfer  level: Needs assistance   Transfers: Sit to/from Stand Sit to Stand: Contact guard assist, Supervision           General transfer comment: cues for hand placement and safety, pt with increased time      Balance Overall balance assessment: History of Falls, Mild deficits observed, not formally tested                                         ADL either performed or assessed with clinical judgement   ADL                                               Vision Baseline Vision/History: 1 Wears glasses Ability to See in Adequate Light: 0 Adequate Patient Visual Report: No change from baseline       Perception         Praxis         Pertinent Vitals/Pain Pain Assessment Pain Assessment: Faces Faces Pain Scale: Hurts little more Pain Location: low back pain Pain Descriptors / Indicators: Aching, Constant Pain Intervention(s): Limited activity within patient's tolerance, Monitored during session, Repositioned     Extremity/Trunk Assessment Upper Extremity Assessment Upper Extremity Assessment: Generalized weakness   Lower Extremity Assessment Lower Extremity Assessment: Defer to PT evaluation   Cervical / Trunk Assessment Cervical / Trunk Assessment: Normal   Communication Communication  Communication: No apparent difficulties   Cognition Arousal: Alert Behavior During Therapy: Flat affect               OT - Cognition Comments: pt reports that she has memory issues and that she feels that she has dementia                 Following commands: Impaired Following commands impaired: Follows one step commands with increased time     Cueing  General Comments   Cueing Techniques: Verbal cues;Gestural cues      Exercises     Shoulder Instructions      Home Living Family/patient expects to be discharged to:: Private residence Living Arrangements: Spouse/significant other Available Help at Discharge:  Family;Available 24 hours/day Type of Home: House Home Access: Stairs to enter [employer] of Steps: 2   Home Layout: Two level;Bed/bath upstairs     Bathroom Shower/Tub: Chief Strategy Officer: Standard     Home Equipment: Rollator (4 wheels);Cane - single point;Shower seat          Prior Functioning/Environment Prior Level of Function : History of Falls (last six months);Independent/Modified Independent             Mobility Comments: reports she only uses rollator when cooking despite falls, has to have assist to get up from falls ADLs Comments: states struggling with stairs and meal prep, taked O2 off to use gas stove    OT Problem List: Decreased strength;Impaired balance (sitting and/or standing);Decreased coordination   OT Treatment/Interventions:        OT Goals(Current goals can be found in the care plan section)   Acute Rehab OT Goals Patient Stated Goal: go home OT Goal Formulation: With patient Time For Goal Achievement: 04/22/24 Potential to Achieve Goals: Good ADL Goals Additional ADL Goal #1: Pt will complete all ADLs/selfcare Mod I Additional ADL Goal #2: Pt/family education for med mgt   OT Frequency:       Co-evaluation              AM-PAC OT "6 Clicks" Daily Activity     Outcome Measure Help from another person eating meals?: None Help from another person taking care of personal grooming?: None Help from another person toileting, which includes using toliet, bedpan, or urinal?: A Little Help from another person bathing (including washing, rinsing, drying)?: A Little Help from another person to put on and taking off regular upper body clothing?: None Help from another person to put on and taking off regular lower body clothing?: A Little 6 Click Score: 21   End of Session Equipment Utilized During Treatment: Rolling walker (2 wheels);Gait belt Nurse Communication: Mobility status  Activity Tolerance: Patient  tolerated treatment well Patient left: in bed;with call bell/phone within reach  OT Visit Diagnosis: Other abnormalities of gait and mobility (R26.89)                Time: 0981-1914 OT Time Calculation (min): 31 min Charges:  OT Evaluation $OT Eval Low Complexity: 1 Low OT Treatments $Self Care/Home Management : 8-22 mins    Alfred Ann 04/08/2024, 2:30 PM

## 2024-04-08 NOTE — Progress Notes (Signed)
 Transition of Care [facility]) - Inpatient Brief Assessment   Patient Details  Name: Gwendolyne Welford MRN: [id number] Date of Birth: May 24, 1956  Transition of Care [facility]) CM/SW Contact:    Dane Dung, RN Phone Number: 04/08/2024, 12:39 PM   Clinical Narrative: CM met with the patient at the bedside to discuss TOC needs.  The patient admitted for syncopal episode and plans to return home when stable for discharge.  Patient has home oxygen at 2L/min [place] through [employer].    [place] letter will be provided to the patient.  Patient was offered Medicare choice for home health services and patient did not have a preference. I called [facility] and [facility] accepted for [facility] Pt/Ot.  HH orders placed to be co-signed by MD.  No other TOC needs at this time.   Transition of Care Asessment: Insurance and Status: (P) Insurance coverage has been reviewed Patient has primary care physician: (P) Yes Home environment has been reviewed: (P) from home with grandson Prior level of function:: (P) Rolator, Hotel manager Home Services: (P) No current home services Social Drivers of Health Review: (P) SDOH reviewed interventions complete Readmission risk has been reviewed: (P) Yes Transition of care needs: (P) transition of care needs identified, TOC will continue to follow

## 2024-04-09 ENCOUNTER — Encounter (HOSPITAL_COMMUNITY): Payer: Self-pay | Admitting: Internal Medicine

## 2024-04-09 NOTE — Progress Notes (Signed)
 DISCHARGE NOTE HOME Teresa Wiley to be discharged Home per MD order. Discussed prescriptions and follow up appointments with the patient. Prescriptions given to patient; medication list explained in detail. Patient verbalized understanding.  Skin clean, dry and intact without evidence of skin break down, no evidence of skin tears noted. IV catheter discontinued intact. Site without signs and symptoms of complications. Dressing and pressure applied. Pt denies pain at the site currently. No complaints noted.  Patient free of lines, drains, and wounds.   An After Visit Summary (AVS) was printed and given to the patient. Patient escorted via wheelchair, and discharged home via private auto.  Tonda Francisco, RN

## 2024-05-23 ENCOUNTER — Observation Stay (HOSPITAL_COMMUNITY)
Admission: EM | Admit: 2024-05-23 | Discharge: 2024-05-24 | Disposition: A | Discharge status: Home / Self Care | Payer: Medicare (Managed Care) | Attending: Internal Medicine | Admitting: Internal Medicine

## 2024-05-23 ENCOUNTER — Encounter (HOSPITAL_COMMUNITY): Payer: Self-pay

## 2024-05-23 ENCOUNTER — Other Ambulatory Visit: Payer: Self-pay

## 2024-05-23 ENCOUNTER — Emergency Department (HOSPITAL_COMMUNITY): Payer: Medicare (Managed Care)

## 2024-05-23 DIAGNOSIS — K5900 Constipation, unspecified: Secondary | ICD-10-CM | POA: Diagnosis not present

## 2024-05-23 DIAGNOSIS — R0602 Shortness of breath: Secondary | ICD-10-CM | POA: Diagnosis present

## 2024-05-23 DIAGNOSIS — I5033 Acute on chronic diastolic (congestive) heart failure: Secondary | ICD-10-CM | POA: Diagnosis not present

## 2024-05-23 DIAGNOSIS — R55 Syncope and collapse: Secondary | ICD-10-CM | POA: Insufficient documentation

## 2024-05-23 DIAGNOSIS — R4189 Other symptoms and signs involving cognitive functions and awareness: Secondary | ICD-10-CM | POA: Diagnosis not present

## 2024-05-23 DIAGNOSIS — J90 Pleural effusion, not elsewhere classified: Secondary | ICD-10-CM | POA: Insufficient documentation

## 2024-05-23 DIAGNOSIS — R296 Repeated falls: Secondary | ICD-10-CM | POA: Diagnosis not present

## 2024-05-23 DIAGNOSIS — J9621 Acute and chronic respiratory failure with hypoxia: Principal | ICD-10-CM | POA: Insufficient documentation

## 2024-05-23 DIAGNOSIS — G9340 Encephalopathy, unspecified: Secondary | ICD-10-CM | POA: Diagnosis not present

## 2024-05-23 DIAGNOSIS — E119 Type 2 diabetes mellitus without complications: Secondary | ICD-10-CM | POA: Diagnosis not present

## 2024-05-23 DIAGNOSIS — I272 Pulmonary hypertension, unspecified: Secondary | ICD-10-CM | POA: Diagnosis not present

## 2024-05-23 DIAGNOSIS — Z7984 Long term (current) use of oral hypoglycemic drugs: Secondary | ICD-10-CM | POA: Insufficient documentation

## 2024-05-23 DIAGNOSIS — I509 Heart failure, unspecified: Principal | ICD-10-CM

## 2024-05-23 DIAGNOSIS — Z91040 Latex allergy status: Secondary | ICD-10-CM | POA: Insufficient documentation

## 2024-05-23 DIAGNOSIS — I110 Hypertensive heart disease with heart failure: Secondary | ICD-10-CM | POA: Insufficient documentation

## 2024-05-23 DIAGNOSIS — G894 Chronic pain syndrome: Secondary | ICD-10-CM | POA: Insufficient documentation

## 2024-05-23 DIAGNOSIS — Z79899 Other long term (current) drug therapy: Secondary | ICD-10-CM | POA: Insufficient documentation

## 2024-05-23 DIAGNOSIS — R42 Dizziness and giddiness: Secondary | ICD-10-CM

## 2024-05-23 DIAGNOSIS — D649 Anemia, unspecified: Secondary | ICD-10-CM | POA: Insufficient documentation

## 2024-05-23 LAB — URINALYSIS, ROUTINE W REFLEX MICROSCOPIC
Bilirubin Urine: NEGATIVE
Glucose, UA: NEGATIVE mg/dL
Hgb urine dipstick: NEGATIVE
Ketones, ur: NEGATIVE mg/dL
Leukocytes,Ua: NEGATIVE
Nitrite: NEGATIVE
Protein, ur: NEGATIVE mg/dL
Specific Gravity, Urine: 1.008 (ref 1.005–1.030)
pH: 7 (ref 5.0–8.0)

## 2024-05-23 LAB — CBC WITH DIFFERENTIAL/PLATELET
Abs Immature Granulocytes: 0.03 10*3/uL (ref 0.00–0.07)
Basophils Absolute: 0 10*3/uL (ref 0.0–0.1)
Basophils Relative: 0 %
Eosinophils Absolute: 0.3 10*3/uL (ref 0.0–0.5)
Eosinophils Relative: 3 %
HCT: 33.1 % — ABNORMAL LOW (ref 36.0–46.0)
Hemoglobin: 9.6 g/dL — ABNORMAL LOW (ref 12.0–15.0)
Immature Granulocytes: 0 %
Lymphocytes Relative: 14 %
Lymphs Abs: 1.7 10*3/uL (ref 0.7–4.0)
MCH: 25.5 pg — ABNORMAL LOW (ref 26.0–34.0)
MCHC: 29 g/dL — ABNORMAL LOW (ref 30.0–36.0)
MCV: 88 fL (ref 80.0–100.0)
Monocytes Absolute: 1 10*3/uL (ref 0.1–1.0)
Monocytes Relative: 9 %
Neutro Abs: 8.7 10*3/uL — ABNORMAL HIGH (ref 1.7–7.7)
Neutrophils Relative %: 74 %
Platelets: 307 10*3/uL (ref 150–400)
RBC: 3.76 MIL/uL — ABNORMAL LOW (ref 3.87–5.11)
RDW: 14.8 % (ref 11.5–15.5)
WBC: 11.7 10*3/uL — ABNORMAL HIGH (ref 4.0–10.5)
nRBC: 0 % (ref 0.0–0.2)

## 2024-05-23 LAB — COMPREHENSIVE METABOLIC PANEL WITH GFR
ALT: 9 U/L (ref 0–44)
AST: 16 U/L (ref 15–41)
Albumin: 3 g/dL — ABNORMAL LOW (ref 3.5–5.0)
Alkaline Phosphatase: 76 U/L (ref 38–126)
Anion gap: 6 (ref 5–15)
BUN: 9 mg/dL (ref 8–23)
CO2: 32 mmol/L (ref 22–32)
Calcium: 8.6 mg/dL — ABNORMAL LOW (ref 8.9–10.3)
Chloride: 101 mmol/L (ref 98–111)
Creatinine, Ser: 0.97 mg/dL (ref 0.44–1.00)
GFR, Estimated: >60 mL/min (ref >60)
Glucose, Bld: 85 mg/dL (ref 70–99)
Potassium: 4.2 mmol/L (ref 3.5–5.1)
Sodium: 139 mmol/L (ref 135–145)
Total Bilirubin: 0.8 mg/dL (ref 0.0–1.2)
Total Protein: 6.6 g/dL (ref 6.5–8.1)

## 2024-05-23 LAB — BRAIN NATRIURETIC PEPTIDE: B Natriuretic Peptide: 104.4 pg/mL — ABNORMAL HIGH (ref 0.0–100.0)

## 2024-05-23 LAB — TROPONIN I (HIGH SENSITIVITY)
Troponin I (High Sensitivity): <2 ng/L (ref <18)
Troponin I (High Sensitivity): 2 ng/L (ref <18)

## 2024-05-23 LAB — ETHANOL: Alcohol, Ethyl (B): <15 mg/dL (ref <15)

## 2024-05-23 MED ORDER — SILDENAFIL CITRATE 20 MG PO TABS
20.0000 mg | ORAL_TABLET | Freq: Three times a day (TID) | ORAL | Status: DC
  Administered 2024-05-24 (×2): 20 mg via ORAL
  Filled 2024-05-23 (×2): qty 1

## 2024-05-23 MED ORDER — CARVEDILOL 3.125 MG PO TABS
6.2500 mg | ORAL_TABLET | Freq: Two times a day (BID) | ORAL | Status: DC
  Administered 2024-05-23 – 2024-05-24 (×2): 6.25 mg via ORAL
  Filled 2024-05-23 (×2): qty 2

## 2024-05-23 MED ORDER — CARVEDILOL 3.125 MG PO TABS: 3.1250 mg | ORAL_TABLET | Freq: Two times a day (BID) | ORAL | Status: DC

## 2024-05-23 MED ORDER — OXYCODONE-ACETAMINOPHEN 10-325 MG PO TABS: 1.0000 | ORAL_TABLET | Freq: Four times a day (QID) | ORAL | Status: DC | PRN

## 2024-05-23 MED ORDER — PANTOPRAZOLE SODIUM 40 MG PO TBEC
40.0000 mg | DELAYED_RELEASE_TABLET | Freq: Two times a day (BID) | ORAL | Status: DC
  Administered 2024-05-23: 40 mg via ORAL
  Filled 2024-05-23: qty 1

## 2024-05-23 MED ORDER — ALPRAZOLAM 0.25 MG PO TABS
1.0000 mg | ORAL_TABLET | Freq: Three times a day (TID) | ORAL | Status: DC | PRN
  Administered 2024-05-24: 1 mg via ORAL
  Filled 2024-05-23: qty 4

## 2024-05-23 MED ORDER — DIPHENHYDRAMINE HCL 25 MG PO TABS: 50.0000 mg | ORAL_TABLET | Freq: Every day | ORAL | Status: DC | PRN

## 2024-05-23 MED ORDER — FUROSEMIDE 10 MG/ML IJ SOLN
40.0000 mg | Freq: Once | INTRAMUSCULAR | Status: AC
  Administered 2024-05-23: 40 mg via INTRAVENOUS
  Filled 2024-05-23: qty 4

## 2024-05-23 MED ORDER — MIRABEGRON ER 25 MG PO TB24
25.0000 mg | ORAL_TABLET | Freq: Every day | ORAL | Status: DC
  Administered 2024-05-24: 25 mg via ORAL
  Filled 2024-05-23: qty 1

## 2024-05-23 MED ORDER — DICLOFENAC SODIUM 1 % EX GEL
4.0000 g | Freq: Three times a day (TID) | CUTANEOUS | Status: DC
  Filled 2024-05-23: qty 100

## 2024-05-23 MED ORDER — MELATONIN 5 MG PO TABS
5.0000 mg | ORAL_TABLET | Freq: Every day | ORAL | Status: DC
  Administered 2024-05-23: 5 mg via ORAL
  Filled 2024-05-23: qty 1

## 2024-05-23 MED ORDER — POLYETHYLENE GLYCOL 3350 17 G PO PACK
17.0000 g | PACK | Freq: Every day | ORAL | Status: DC
  Filled 2024-05-23: qty 1

## 2024-05-23 MED ORDER — GABAPENTIN 600 MG PO TABS: 600.0000 mg | ORAL_TABLET | Freq: Three times a day (TID) | ORAL | Status: DC | PRN

## 2024-05-23 MED ORDER — DEXAMETHASONE SODIUM PHOSPHATE 10 MG/ML IJ SOLN
10.0000 mg | Freq: Once | INTRAMUSCULAR | Status: AC
  Administered 2024-05-23: 10 mg via INTRAVENOUS
  Filled 2024-05-23: qty 1

## 2024-05-23 MED ORDER — GABAPENTIN 300 MG PO CAPS
600.0000 mg | ORAL_CAPSULE | Freq: Three times a day (TID) | ORAL | Status: DC | PRN
  Administered 2024-05-24: 600 mg via ORAL
  Filled 2024-05-23: qty 2

## 2024-05-23 MED ORDER — OXYCODONE-ACETAMINOPHEN 5-325 MG PO TABS
1.0000 | ORAL_TABLET | Freq: Four times a day (QID) | ORAL | Status: DC | PRN
  Administered 2024-05-23 – 2024-05-24 (×2): 1 via ORAL
  Filled 2024-05-23 (×2): qty 1

## 2024-05-23 MED ORDER — RIVAROXABAN 10 MG PO TABS
10.0000 mg | ORAL_TABLET | Freq: Every day | ORAL | Status: DC
  Administered 2024-05-23: 10 mg via ORAL
  Filled 2024-05-23: qty 1

## 2024-05-23 MED ORDER — SENNOSIDES-DOCUSATE SODIUM 8.6-50 MG PO TABS
2.0000 | ORAL_TABLET | Freq: Two times a day (BID) | ORAL | Status: DC
  Administered 2024-05-23: 2 via ORAL
  Filled 2024-05-23: qty 2

## 2024-05-23 MED ORDER — ALPRAZOLAM 0.25 MG PO TABS
1.0000 mg | ORAL_TABLET | Freq: Three times a day (TID) | ORAL | Status: DC
  Administered 2024-05-23: 1 mg via ORAL
  Filled 2024-05-23: qty 4

## 2024-05-23 MED ORDER — LEVOFLOXACIN IN D5W 500 MG/100ML IV SOLN
500.0000 mg | Freq: Once | INTRAVENOUS | Status: AC
  Administered 2024-05-23: 500 mg via INTRAVENOUS
  Filled 2024-05-23: qty 100

## 2024-05-23 MED ORDER — OXYCODONE HCL 5 MG PO TABS
5.0000 mg | ORAL_TABLET | Freq: Four times a day (QID) | ORAL | Status: DC | PRN
  Administered 2024-05-23 – 2024-05-24 (×2): 5 mg via ORAL
  Filled 2024-05-23 (×2): qty 1

## 2024-05-23 MED ORDER — ACETAMINOPHEN 500 MG PO TABS: 500.0000 mg | ORAL_TABLET | Freq: Four times a day (QID) | ORAL | Status: DC | PRN

## 2024-05-23 MED ORDER — VITAMIN D 25 MCG (1000 UNIT) PO TABS
1000.0000 [IU] | ORAL_TABLET | Freq: Every day | ORAL | Status: DC
  Administered 2024-05-23: 1000 [IU] via ORAL
  Filled 2024-05-23: qty 1

## 2024-05-23 NOTE — ED Triage Notes (Signed)
 Pt BIB [facility] from home for hypoxia and lethargy. On EMS arrival pt was hypoxic in 80s%, pt now on 2L [place] at 95%. Pt is aox4, complaining of pelvic, lower back, and leg pain. All other VSS.

## 2024-05-23 NOTE — ED Notes (Signed)
 Patient transported to X-ray

## 2024-05-23 NOTE — ED Provider Notes (Signed)
 [facility] EMERGENCY DEPARTMENT AT [facility] Provider Note   CSN: [id number] Arrival date & time: 05/23/24  1618     Patient presents with: Fatigue and Shortness of Breath   Teresa Wiley is a 68 y.o. female.   HPI Patient presents via EMS with concern for shortness of breath.  Patient also has pain in her lower back wrapping circumferentially around to her lower abdomen and some difficulty with urination.  The latter complaint seems relatively new, but not acute.  However, the patient developed shortness of breath earlier in the day, without chest pain.  EMS reports that on the arrival her saturation on typical 2 L was 85%.  This improved with first nonrebreather, then nasal cannula to upper 90s percent. The patient has seemingly been taking her medication as directed, medications include Eliquis , furosemide .     Prior to Admission medications   Medication Sig Start Date End Date Taking? Authorizing Provider  ALPRAZolam  (XANAX ) 1 MG tablet Take 1 mg by mouth 3 (three) times daily. 06/06/20   [provider]  ALPRAZolam  (XANAX ) 1 MG tablet Take 1 mg by mouth as needed for anxiety. 03/31/24   [provider]  apixaban  (ELIQUIS ) 5 MG TABS tablet Take 5 mg by mouth 2 (two) times daily.    [provider]  atorvastatin  (LIPITOR) 20 MG tablet Take 20 mg by mouth daily. Patient not taking: Reported on 12/29/2023 08/09/23   [provider]  atorvastatin  (LIPITOR) 20 MG tablet Take 1 tablet (20 mg total) by mouth daily. 04/08/24   Harrie Bruckner, DO  carvedilol  (COREG ) 6.25 MG tablet Take 1 tablet (6.25 mg total) by mouth 2 (two) times daily with a meal. 01/07/24   Bensimhon, Toribio SAUNDERS, MD  carvedilol  (COREG ) 6.25 MG tablet Take 6.25 mg by mouth 2 (two) times daily. 03/31/24   [provider]  cyclobenzaprine  (FLEXERIL ) 10 MG tablet Take 10 mg by mouth 3 (three) times daily as needed for muscle spasms. 09/09/23   [provider]   diclofenac  Sodium (VOLTAREN  ARTHRITIS PAIN) 1 % GEL Apply 4 g topically 3 (three) times daily.    [provider]  diphenhydrAMINE  (BENADRYL ) 25 MG tablet Take 100 mg by mouth daily as needed for sleep.    [provider]  ELIQUIS  5 MG TABS tablet Take 5 mg by mouth 2 (two) times daily. 02/14/24   [provider]  furosemide  (LASIX ) 40 MG tablet Take 40 mg by mouth daily.    [provider]  furosemide  (LASIX ) 40 MG tablet Take 40 mg by mouth daily. 03/14/24   [provider]  gabapentin  (NEURONTIN ) 600 MG tablet Take 600 mg by mouth 3 (three) times daily as needed (pain).    [provider]  gabapentin  (NEURONTIN ) 600 MG tablet Take 600 mg by mouth 3 (three) times daily. 03/17/24   [provider]  oxybutynin  (DITROPAN ) 5 MG tablet Take 5 mg by mouth every 12 (twelve) hours. 12/23/23   [provider]  oxybutynin  (DITROPAN ) 5 MG tablet Take 5 mg by mouth 2 (two) times daily. 03/31/24   [provider]  oxyCODONE -acetaminophen  (PERCOCET) 10-325 MG tablet Take 1 tablet by mouth 4 (four) times daily as needed for pain. 02/01/24   Arlice Reichert, MD  oxyCODONE -acetaminophen  (PERCOCET) 10-325 MG tablet Take 1 tablet by mouth in the morning, at noon, in the evening, and at bedtime. 03/19/24   [provider]  OXYGEN Inhale 2 L into the lungs continuous.  [provider]  OZEMPIC , 2 MG/DOSE, 8 MG/3ML SOPN Inject 2 mg into the skin once a week. On Thursday. 09/05/23   [provider]  pantoprazole  (PROTONIX ) 40 MG tablet Take 40 mg by mouth 2 (two) times daily. 05/24/22   [provider]  pantoprazole  (PROTONIX ) 40 MG tablet Take 1 tablet (40 mg total) by mouth daily. 04/08/24   Harrie Bruckner, DO  Semaglutide , 2 MG/DOSE, 8 MG/3ML SOPN Inject 2 mg into the skin once a week. On Thursday 03/16/24   [provider]  sildenafil  (REVATIO ) 20 MG tablet Take 1 tablet (20 mg total) by mouth 3  (three) times daily. Patient not taking: Reported on 02/01/2024 06/14/21   Jerri Keys, MD  Vitamin D , Ergocalciferol , (DRISDOL ) 1.25 MG (50000 UNIT) CAPS capsule Take 50,000 Units by mouth once a week. 12/08/23   [provider]  Vitamin D , Ergocalciferol , (DRISDOL ) 1.25 MG (50000 UNIT) CAPS capsule Take 50,000 Units by mouth once a week. On Wednesday 03/04/24   [provider]    Allergies: Belbuca  [buprenorphine  hcl], E.e.s. [erythromycin], Iodine , Shellfish allergy, Shellfish allergy, Butrans  [buprenorphine ], Latex, Pork-derived products, and Other    Review of Systems  Updated Vital Signs BP 111/69   Pulse 77   Temp 98.7 F (37.1 C) (Oral)   Resp 11   Ht 5' 9 (1.753 m)   Wt 100.2 kg   SpO2 94%   BMI 32.64 kg/m   Physical Exam Vitals and nursing note reviewed.  Constitutional:      General: She is not in acute distress.    Appearance: She is well-developed.  HENT:     Head: Normocephalic and atraumatic.   Eyes:     Conjunctiva/sclera: Conjunctivae normal.    Cardiovascular:     Rate and Rhythm: Normal rate and regular rhythm.  Pulmonary:     Effort: Pulmonary effort is normal. No respiratory distress.     Breath sounds: Normal breath sounds. No stridor.  Abdominal:     General: There is no distension.   Skin:    General: Skin is warm and dry.   Neurological:     Mental Status: She is alert and oriented to person, place, and time.     Cranial Nerves: No cranial nerve deficit.   Psychiatric:        Mood and Affect: Mood normal.     (all labs ordered are listed, but only abnormal results are displayed) Labs Reviewed  BRAIN NATRIURETIC PEPTIDE - Abnormal; Notable for the following components:      Result Value   B Natriuretic Peptide 104.4 (*)    All other components within normal limits  COMPREHENSIVE METABOLIC PANEL WITH GFR - Abnormal; Notable for the following components:   Calcium  8.6 (*)    Albumin  3.0 (*)    All other components within  normal limits  CBC WITH DIFFERENTIAL/PLATELET - Abnormal; Notable for the following components:   WBC 11.7 (*)    RBC 3.76 (*)    Hemoglobin 9.6 (*)    HCT 33.1 (*)    MCH 25.5 (*)    MCHC 29.0 (*)    Neutro Abs 8.7 (*)    All other components within normal limits  ETHANOL  URINALYSIS, ROUTINE W REFLEX MICROSCOPIC  TROPONIN I (HIGH SENSITIVITY)  TROPONIN I (HIGH SENSITIVITY)    EKG: EKG Interpretation Date/Time:  Sunday May 23 2024 16:37:22 EDT Ventricular Rate:  82 PR Interval:  173 QRS Duration:  91 QT Interval:  377 QTC  Calculation: 441 R Axis:   54  Text Interpretation: Sinus rhythm Abnormal R-wave progression, early transition Confirmed by Garrick Charleston [phone]) on 05/23/2024 5:55:20 PM  Radiology: DG Chest 2 View Result Date: 05/23/2024 CLINICAL DATA:  Shortness of breath. EXAM: CHEST - 2 VIEW COMPARISON:  January 31, 2024 FINDINGS: Limited study secondary to patient rotation and overlying cardiac lead wires. The heart size and mediastinal contours are within normal limits. Mild areas of atelectasis and/or infiltrate are seen within the bilateral lung bases. This is decreased in severity when compared to the prior study. Small, stable bilateral pleural effusions are noted. No pneumothorax is identified. The visualized skeletal structures are unremarkable. IMPRESSION: 1. Mild bibasilar atelectasis and/or infiltrate, decreased in severity when compared to the prior study. 2. Small, stable bilateral pleural effusions. Electronically Signed   By: Suzen Dials M.D.   On: 05/23/2024 17:31     Procedures   Medications Ordered in the ED  oxyCODONE -acetaminophen  (PERCOCET/ROXICET) 5-325 MG per tablet 1 tablet (has no administration in time range)    And  oxyCODONE  (Oxy IR/ROXICODONE ) immediate release tablet 5 mg (has no administration in time range)  furosemide  (LASIX ) injection 40 mg (has no administration in time range)  levofloxacin  (LEVAQUIN ) IVPB 500 mg (has no  administration in time range)  dexamethasone  (DECADRON ) injection 10 mg (has no administration in time range)                                    Medical Decision Making Elderly female with multiple medical issues presents with shortness of breath, low back pain radiating anteriorly with dysuria.  Broad differential including heart failure exacerbation, anasarca secondary to renal dysfunction, cystitis, bacteremia, sepsis.  Patient is awake, alert, seemingly interacting in a typical manner.  She does have a history of chronic pain, requests pain medication after my initial evaluation.  Amount and/or Complexity of Data Reviewed Independent Historian: EMS External Data Reviewed: notes. Labs: ordered. Decision-making details documented in ED Course. Radiology: ordered and independent interpretation performed. Decision-making details documented in ED Course. ECG/medicine tests: ordered and independent interpretation performed. Decision-making details documented in ED Course.  Risk Prescription drug management. Decision regarding hospitalization. Diagnosis or treatment significantly limited by social determinants of health.  Echo from May of this year reviewed, grade 1 diastolic dysfunction 6:32 PM On repeat exam patient continues to require 3 L rather than her home baseline of 2 for saturation in the upper 90%.  Labs x-ray reviewed.  Patient with evidence for persistent opacification, concerning for pneumonia, though fluid retention with elevated BNP is possibility.  Given her history of heart failure, pulmonary hypertension, she has received Lasix , steroids, and with leukocytosis, concern for persistent pneumonia, antibiotics as well.  With new oxygen requirement, patient will be admitted for further monitoring, management.     Final diagnoses:  Acute exacerbation of chronic heart failure (HCC)  Pleural effusion     Garrick Charleston, MD 05/23/24 [phone]
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
## 2024-05-23 NOTE — ED Notes (Signed)
 Pt states she is uncomfortable using the purewick  Non-slip socks placed on pt Pt asissted to the bedside commode with this paramedic
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
## 2024-05-23 NOTE — ED Notes (Signed)
 Pure wick in place

## 2024-05-24 DIAGNOSIS — J9621 Acute and chronic respiratory failure with hypoxia: Secondary | ICD-10-CM | POA: Diagnosis not present

## 2024-05-24 LAB — CBC
HCT: 35.9 % — ABNORMAL LOW (ref 36.0–46.0)
Hemoglobin: 10.3 g/dL — ABNORMAL LOW (ref 12.0–15.0)
MCH: 25.2 pg — ABNORMAL LOW (ref 26.0–34.0)
MCHC: 28.7 g/dL — ABNORMAL LOW (ref 30.0–36.0)
MCV: 87.8 fL (ref 80.0–100.0)
Platelets: 296 10*3/uL (ref 150–400)
RBC: 4.09 MIL/uL (ref 3.87–5.11)
RDW: 14.5 % (ref 11.5–15.5)
WBC: 7.8 10*3/uL (ref 4.0–10.5)
nRBC: 0 % (ref 0.0–0.2)

## 2024-05-24 LAB — BASIC METABOLIC PANEL WITH GFR
Anion gap: 14 (ref 5–15)
BUN: 12 mg/dL (ref 8–23)
CO2: 21 mmol/L — ABNORMAL LOW (ref 22–32)
Calcium: 8.5 mg/dL — ABNORMAL LOW (ref 8.9–10.3)
Chloride: 102 mmol/L (ref 98–111)
Creatinine, Ser: 0.97 mg/dL (ref 0.44–1.00)
GFR, Estimated: >60 mL/min (ref >60)
Glucose, Bld: 130 mg/dL — ABNORMAL HIGH (ref 70–99)
Potassium: 4.2 mmol/L (ref 3.5–5.1)
Sodium: 137 mmol/L (ref 135–145)

## 2024-05-24 LAB — IRON AND TIBC
Iron: 19 ug/dL — ABNORMAL LOW (ref 28–170)
Saturation Ratios: 4 % — ABNORMAL LOW (ref 10.4–31.8)
TIBC: 462 ug/dL — ABNORMAL HIGH (ref 250–450)
UIBC: 443 ug/dL

## 2024-05-24 LAB — PROCALCITONIN: Procalcitonin: <0.1 ng/mL

## 2024-05-24 LAB — FERRITIN: Ferritin: 29 ng/mL (ref 11–307)

## 2024-05-24 MED ORDER — SILDENAFIL CITRATE 20 MG PO TABS: 20.0000 mg | ORAL_TABLET | Freq: Three times a day (TID) | ORAL | Status: DC

## 2024-05-24 NOTE — ED Notes (Signed)
 Patient reports no improvement in pain after PRN medication, however patient falling asleep when obtaining morning labs

## 2024-05-24 NOTE — ED Notes (Signed)
 Pt opens her eyes with no response verbally sat alarming at 88  02increased sats 95-97 now

## 2024-05-24 NOTE — Evaluation (Signed)
 Physical Therapy Evaluation Patient Details Name: Teresa Wiley MRN: [id number] DOB: [birth year] Today's Date: 05/24/2024  History of Present Illness  Pt is a 68 y.o. female presenting 05/23/24 with SOB and pain in lower back wrapping circumferentially to abdomen. EKG NSR, CXR: small bilateral pleural effusions. Found to have acute HFpEF exacerbation, pulmonary HTN, encephalopathy, anemia.  PMH: pulmonary HTN, chronic hypoxic respiratory failure on 2L home O2, chronic diastolic CHF, T2DM, polypharmacy w/ chronic benzodiazapine and opiate use.   Clinical Impression  Teresa Wiley is 68 y.o. female admitted with above HPI and diagnosis. Patient is currently limited by functional impairments below (see PT problem list). Patient lives with son and her wife in [place] currently but prefers staying with her daughter in [place] and is mod ind with intermittent use of AD's and chronic use of supplemental O2 at baseline. Eval limited as pt declining EOB or OOB activity but agreeable to reposition for back comfort in bed. Sup/CGA for supine<>long sit in bed and pt able to scoot hips posteriorly without assist to reposition. Patient will benefit from continued skilled PT interventions to address impairments and progress independence with mobility, recommending HHPT if pt does return home to daughters or sons. Will continue to assess in acute setting as able and update recs as needed. Acute PT will follow and progress as able.         If plan is discharge home, recommend the following: A lot of help with walking and/or transfers;A lot of help with bathing/dressing/bathroom;Assistance with cooking/housework;Direct supervision/assist for medications management;Assist for transportation;Help with stairs or ramp for entrance   Can travel by private vehicle        Equipment Recommendations None recommended by PT  Recommendations for Other Services       Functional Status Assessment Patient has had a  recent decline in their functional status and demonstrates the ability to make significant improvements in function in a reasonable and predictable amount of time.     Precautions / Restrictions Precautions Precautions: Fall Recall of Precautions/Restrictions: Impaired Restrictions Weight Bearing Restrictions Per Provider Order: No      Mobility  Bed Mobility Overal bed mobility: Needs Assistance Bed Mobility: Supine to Sit, Sit to Supine     Supine to sit: Used rails Sit to supine: Used rails   General bed mobility comments: pt declining EOB or OOB activity. able to sit up to long sit from slightly elevated HOB and flat bed with use of bed rails. able to scoot posteriorly to move superiorly in bed for repositioning to more upright chair position.    Transfers                        Ambulation/Gait                  Stairs            Wheelchair Mobility     Tilt Bed    Modified Rankin (Stroke Patients Only)       Balance Overall balance assessment: History of Falls                                           Pertinent Vitals/Pain Pain Assessment Pain Assessment: Faces Faces Pain Scale: Hurts little more Pain Location: Legs and Rt side and abdomen Pain Descriptors / Indicators: Discomfort Pain Intervention(s): Monitored during session, Repositioned  Home Living Family/patient expects to be discharged to:: Private residence Living Arrangements: Spouse/significant other Available Help at Discharge: Family;Available 24 hours/day Type of Home: House Home Access: Stairs to enter   Entrance Stairs-Number of Steps: 2 Alternate Level Stairs-Number of Steps: flight Home Layout: Two level;Bed/bath upstairs Home Equipment: Rollator (4 wheels);Cane - single point;Shower seat (portable O2 battery power, also concentrator at house in [place]. pt wants to go to [facility].)      Prior Function Prior Level of Function : History of Falls  (last six months);Independent/Modified Independent             Mobility Comments: reoprts has rollator, does not elaborate on frequency of use. per prior admission uses when cooking but not at all times. also wears O2 chronically and has protable battery powered as well as concentrator with extension tube. ADLs Comments: pt struggling with bathroom, reports likes to take O2 off for going to bathroom (just doesn't want to be bothered with it) takes O2 off to use gas stove     Extremity/Trunk Assessment   Upper Extremity Assessment Upper Extremity Assessment: Defer to OT evaluation    Lower Extremity Assessment Lower Extremity Assessment: Generalized weakness (limited testing due to pt unwilling)    Cervical / Trunk Assessment Cervical / Trunk Assessment: Kyphotic  Communication   Communication Communication: No apparent difficulties    Cognition Arousal: Alert Behavior During Therapy: Anxious   PT - Cognitive impairments: No family/caregiver present to determine baseline                       PT - Cognition Comments: poor awareness of safety and situation. emotional throughout. Following commands: Impaired       Cueing Cueing Techniques: Verbal cues     General Comments      Exercises     Assessment/Plan    PT Assessment Patient needs continued PT services  PT Problem List Decreased strength;Cardiopulmonary status limiting activity;Decreased knowledge of precautions;Decreased safety awareness;Decreased knowledge of use of DME;Decreased cognition;Decreased coordination;Decreased mobility;Decreased balance;Decreased activity tolerance;Decreased range of motion;Pain       PT Treatment Interventions DME instruction;Gait training;Stair training;Functional mobility training;Therapeutic activities;Therapeutic exercise;Balance training;Neuromuscular re-education;Cognitive remediation;Patient/family education    PT Goals (Current goals can be found in the Care  Plan section)  Acute Rehab PT Goals Patient Stated Goal: go to her daughters PT Goal Formulation: With patient Time For Goal Achievement: 06/07/24 Potential to Achieve Goals: Fair    Frequency Min 2X/week     Co-evaluation               AM-PAC PT 6 Clicks Mobility  Outcome Measure Help needed turning from your back to your side while in a flat bed without using bedrails?: A Little Help needed moving from lying on your back to sitting on the side of a flat bed without using bedrails?: A Little Help needed moving to and from a bed to a chair (including a wheelchair)?: A Little Help needed standing up from a chair using your arms (e.g., wheelchair or bedside chair)?: A Little Help needed to walk in hospital room?: A Lot Help needed climbing 3-5 steps with a railing? : Total 6 Click Score: 15    End of Session   Activity Tolerance: Patient tolerated treatment well Patient left: with call bell/phone within reach;in bed;with nursing/sitter in room Nurse Communication: Mobility status PT Visit Diagnosis: Other abnormalities of gait and mobility (R26.89);Muscle weakness (generalized) (M62.81);Difficulty in walking, not elsewhere classified (R26.2);Other symptoms and  signs involving the nervous system (R29.898);History of falling (Z91.81)    Time: 9084-9059 PT Time Calculation (min) (ACUTE ONLY): 25 min   Charges:   PT Evaluation $PT Eval Moderate Complexity: 1 Mod   PT General Charges $$ ACUTE PT VISIT: 1 Visit         Vernell DONEEN KLEIN, DPT Acute Rehabilitation Services Office [phone]  05/24/24 10:36 AM

## 2024-05-24 NOTE — ED Notes (Signed)
 Called CCMD to add to cardiac monitoring

## 2024-05-24 NOTE — Discharge Summary (Cosign Needed)
 Name: Teresa Wiley MRN: [id number] DOB: 11-28-1955 68 y.o. PCP: Teresa Bernardino Hutchinson, PA  Date of Admission: 05/23/2024  4:18 PM Date of Discharge: 05/24/2024 Attending Physician: Dr. Karna   Patient was discharged Against Medical Advice  Discharge Diagnosis: 1. Acute on Chronic Hypoxic Respiratory Failure Acute HFpEF exacerbation Pulmonary hypertension  Discharge Medications: Allergies as of 05/24/2024       Reactions   Belbuca  [buprenorphine  Hcl] Hives   E.e.s. [erythromycin] Anaphylaxis, Nausea And Vomiting   Iodine  Hives, Swelling, Other (See Comments)   Throat swelling   Shellfish Allergy Anaphylaxis, Hives   Shellfish Allergy Anaphylaxis   Wound Dressing Adhesive Hives, Itching, Rash   PAPER TAPE ONLY   Butrans  [buprenorphine ] Other (See Comments)   Patient stated the first time she used the patch she was talking to family just fine and the next thing she knew they were standing over me like I was dying   Latex    Powder in the latex and balloons.   Pork-derived Products    religious   Other Rash   Electrodes stickers        Medication List     PAUSE taking these medications    baclofen  10 MG tablet Wait to take this until your doctor or other care provider tells you to start again. Commonly known as: LIORESAL  Take 10 mg by mouth 3 (three) times daily as needed.   cyclobenzaprine  10 MG tablet Wait to take this until your doctor or other care provider tells you to start again. Commonly known as: FLEXERIL  Take 10 mg by mouth 3 (three) times daily as needed for muscle spasms.       STOP taking these medications    acetaminophen  325 MG tablet Commonly known as: TYLENOL    apixaban  5 MG Tabs tablet Commonly known as: ELIQUIS    diphenhydrAMINE  25 MG tablet Commonly known as: BENADRYL    ibuprofen 200 MG tablet Commonly known as: ADVIL       TAKE these medications    ALPRAZolam  1 MG tablet Commonly known as: XANAX  Take 1 mg by mouth 3  (three) times daily as needed for anxiety.   carvedilol  6.25 MG tablet Commonly known as: COREG  Take 6.25 mg by mouth 2 (two) times daily.   furosemide  40 MG tablet Commonly known as: LASIX  Take 40 mg by mouth daily.   gabapentin  600 MG tablet Commonly known as: NEURONTIN  Take 600 mg by mouth 3 (three) times daily.   oxybutynin  5 MG tablet Commonly known as: DITROPAN  Take 5 mg by mouth 2 (two) times daily.   oxyCODONE -acetaminophen  10-325 MG tablet Commonly known as: PERCOCET Take 1 tablet by mouth in the morning, at noon, in the evening, and at bedtime.   OXYGEN Inhale 2 L into the lungs continuous.   pantoprazole  40 MG tablet Commonly known as: PROTONIX  Take 1 tablet (40 mg total) by mouth daily.   Semaglutide  (2 MG/DOSE) 8 MG/3ML Sopn Inject 2 mg into the skin once a week. On Thursday   sildenafil  20 MG tablet Commonly known as: REVATIO  Take 1 tablet (20 mg total) by mouth 3 (three) times daily.   Vitamin D  (Ergocalciferol ) 1.25 MG (50000 UNIT) Caps capsule Commonly known as: DRISDOL  Take 50,000 Units by mouth once a week. On Wednesday   Voltaren  Arthritis Pain 1 % Gel Generic drug: diclofenac  Sodium Apply 4 g topically 3 (three) times daily.        Disposition and follow-up:   Ms.Teresa Wiley was discharged from [facility] in Fair condition.  At the hospital follow up visit please address:  Follow Up:  --AoCHRF: stable on 2L in ED which is her baseline on day of d/c, ensure stable and adherent to home 2L [place], ensure f/u with pulmonology about pulmonary HTN and her sildenafil .   --Polypharmacy: Stopped benadryl , flexeril  and baclofen . Stopped Eliquis  since it was for DVT ppx after tibia fx s/p IMN in 08/2023. Discussed with patient to work with PCP on reducing further sedating medications such as Xanax  and Percocet if able. Unfortunately left AMA.   --Falls/Postural dizziness: Assess for further symptoms or falls. Discussed polypharmacy  contributing to this likely and would benefit from deprescribing. Unable to assess orthostatics, further PT evaluation, or further imaging of R shoulder and back after last fall due to patient leaving. Will need f/u outpatient with PCP.   --IDA: recommend oral vs IV iron  supplementation at PCP f/u visit  2.  Labs / imaging needed at time of follow-up: none  3.  Pending labs/ test needing follow-up: BMP, CBC  Follow-up Appointments: Follow up with PCP, pulmonology and pain specialist   Hospital Course by problem list: [name] is a 68 y.o. person living with a history of pulmonary HTN, chronic hypoxic respiratory failure on 2L home O2, chronic diastolic CHF, type 2 diabetes mellitus, and polypharmacy who presented with presyncope and dyspnea and admitted for acute on chronic hypoxic respiratory failure.    Acute on Chronic Hypoxic Respiratory Failure Acute HFpEF exacerbation Pulmonary hypertension Presented with acute on chronic hypoxia c/f acute HFpEF exacerbation given elevated BNP and b/l pleural effusions. She required increased O2 demand above her home 2L [place]. Received IV lasix  with improvement and able to wean down to 2L [place]. Was initially treated with IV abx for c/f pneumonia but normal PCL, afebrile with mild leukocytosis. Hx of pulmonary HTN on sildenafil  20 mg TID per patient and family (despite last dispense Rx was for 6 times daily). Originally plan was to continue diuresis and consult pulmonology for guidance on her pulmonary HTN and sildenafil  but patient left against medical advice. She has decision making capacity and voices risks of leaving hospital. Family is aware over phone and at bedside. Recommend f/u with outpatient pulmonology regarding her sildenafil . Patient to resume home lasix  and coreg  at discharge.    Postural dizziness with presyncope Recurrent Falls  Symptoms are primarily consistent with orthostatic dizziness, however she has symptoms in randomly in the car.  No sudden syncope recently, but has had syncope in the past. She has had several medications that are possibly contributing including benzodiazapines, opiates, baclofen , oxybutynin , sildenafil , and diphenhydramine . She also has a history of pulmonary hypertension and has chronic respiratory failure with low oxygen saturations recently, however these symptoms have also predated her recent hypoxia. She has a  chart history of pulmonary embolism supposedly in 2017 and has been on and off anticoagulation since then, however chart review  does not show any positive scans for pulmonary emboli or any history of DVT or intracardiac thrombus. Recommendation by pulmonology was to discontinue apixaban  at visit 01/2022 given no history of blood clot and complications with bleeding. She was prescribed a decreased dose of Eliquis  from what appears to be for VTE ppx after her tibia fx s/p IMN in 08/2023. Overall most consistent with polypharmacy and physical deconditioning. Patient left against medical advice. Recommend she f/u with PCP, pulmonology and pain specialist regarding polypharmacy which could be contributing to her symptoms.    Polypharmacy  The  patient has multiple centrally acting medications, medications with respiratory depressing qualities, and medications that increase fall risk including alprazolam , baclofen , cyclobenzaprine , gabapentin , oxybutynin , oxycodone , and sildenafil . Advised patient to stop benadryl , baclofen  and flexeril . Stopped Eliquis  since it appears to have been started for tibia fx s/p IMN in 08/2023 for VTE ppx and no history of clot identified. Recommend she f/u with PCP, pulmonology and pain specialist regarding polypharmacy which could be contributing to her symptoms.    Chronic Pain Syndrome Patient sees pain medicine, PDMP reviewed and consistent with appropriate prescription use, takes Oxycodone  10 mg QID, Baclofen , gabapentin , and cyclobenzaprine  at home to control her back pain.  She  reports inadequate pain control on current medications. Holding baclofen  and flexeril . Recommend possible taper down chronic opioids given polypharmacy.   Encephalopathy Cognitive Impairment Seems to be due to a combination of hypoxia and polypharmacy.  She does appear near her baseline based on discussions with family now that her O2 saturation is back to normal.  At baseline she has some cognitive impairment and her wife handles all of her medications.  Patient should likely also not be driving but is reluctant to give up more autonomy.  Patient's wife states they are to follow with outpatient neurology.    Normocytic Anemia Baseline Hgb between 9-11. ED Hgb was 9.6, improved to 10.3.  Hx of IDA and iron  studies today consistent with that. Patient to discuss oral vs IV supplementation with PCP.    Constipation Wife reported that the patient has not had a bowel movement in over a week and has tried MiraLAX  at home. Started senna-docusate and miralax  to soften stool and increase colonic motility.   Diabetes type 2, controlled Stable on Ozempic  with last injection on 05/20/24, A1c was 7.2 in 2019 and most recently 4.8 last month  Discharge Subjective:  Patient states breathing is better. States ongoing chronic pains in her feet, lower back despite her home pain medications. States she wants to go home. Discussed extensively with patient, patient's wife over phone and patient's grandson that our recommendation is to remain in hospital to continue diuresis, optimize medications and further evaluate PT/OT for recurrent falls. However, patient voices she wants to go home. She is alert and oriented and able to recall events. She is able to voice the risks of leaving hospital. [place] and patient's wife aware and will support patient at home as much as possible. Discussed returned precautions.   Discharge Exam:   BP (!) 145/97   Pulse 82   Temp (!) 97.5 F (36.4 C)   Resp 19   Ht 5' 9 (1.753 m)    Wt 100.2 kg   SpO2 98%   BMI 32.64 kg/m  Discharge exam:  Physical Exam Constitutional:      General: She is not in acute distress.    Appearance: She is not ill-appearing.   Cardiovascular:     Rate and Rhythm: Normal rate and regular rhythm.  Pulmonary:     Effort: Pulmonary effort is normal.     Breath sounds: Examination of the right-lower field reveals decreased breath sounds. Examination of the left-lower field reveals decreased breath sounds. Decreased breath sounds present. No wheezing or rales.   Musculoskeletal:     Comments: -Trace LE edema bilaterally. -TTP over right mid-axillary after fall  -Able to perform ROM of R shoulder but limited due to pain after fall   Skin:    General: Skin is warm and dry.   Neurological:  General: No focal deficit present.     Mental Status: She is alert and oriented to person, place, and time.     Pertinent Labs, Studies, and Procedures:  Lab Results  Component Value Date   WBC 7.8 05/24/2024   HGB 10.3 (L) 05/24/2024   HCT 35.9 (L) 05/24/2024   MCV 87.8 05/24/2024   PLT 296 05/24/2024   Lab Results  Component Value Date   NA 137 05/24/2024   K 4.2 05/24/2024   CO2 21 (L) 05/24/2024   GLUCOSE 130 (H) 05/24/2024   BUN 12 05/24/2024   CREATININE 0.97 05/24/2024   CALCIUM  8.5 (L) 05/24/2024   EGFR 60 09/02/2022   GFRNONAA >60 05/24/2024    Discharge Instructions: Patient left AMA. Discussed at bedside with patient, patient's wife (over phone) and patient's grandson regarding stopping medications (benadryl , baclofen , flexeril  and Eliquis ) and ensure f/u with PCP, pain mgmt and pulmonology.   Signed: Jaedyn Marrufo, DO 05/24/2024, 4:35 PM

## 2024-05-24 NOTE — ED Notes (Signed)
 MD made aware of patient c/o pain and the want to sign out.

## 2024-07-23 ENCOUNTER — Telehealth: Payer: Self-pay

## 2024-07-23 NOTE — Telephone Encounter (Signed)
 Copied from CRM [id number]. Topic: Clinical - Lab/Test Results >> Jul 22, 2024  2:25 PM Russell PARAS wrote: Reason for CRM:   Pt is contacting clinic regarding test results. She reports having incentive spirometry early part of 2024, but had never received her results or had reviewed. Requested call back  CB#  [phone]    Spoke w/ Pt [facility]  has a OV 09/09    -NFN

## 2024-08-03 ENCOUNTER — Ambulatory Visit: Payer: Medicare (Managed Care) | Admitting: Adult Health

## 2024-08-03 ENCOUNTER — Ambulatory Visit: Payer: Medicare (Managed Care) | Admitting: Internal Medicine

## 2024-08-03 ENCOUNTER — Ambulatory Visit: Payer: Self-pay | Admitting: Internal Medicine

## 2024-08-03 NOTE — Telephone Encounter (Signed)
 [facility] was called to advise them that the patient was unable to come to her appointment this afternoon due to her eye problem.  They are also advised that the patient was advised to go to the Emergency Room and she states she will go when she has transportation around 6:30PM today Appointment made for tomorrow at Pulmonary for patient's follow up appointment with Dr Meade and patient is advised to call us  if anything changes.   FYI Only or Action Required?: FYI only for provider.  Patient is followed in Pulmonology for chronic respiratory failure, pulmonary hypertension, last seen on 12/12/2022 by Parrett, Madelin RAMAN, NP.  Called Nurse Triage reporting Eye Problem.  Symptoms began 2-3 am today when patient woke up in the middle of the night.  Interventions attempted: Other: patient states she took a painkiller to see if it would help her eye pain.  Symptoms are: rapidly worsening.  Triage Disposition: Go to ED Now (Notify PCP)  Patient/caregiver understands and will follow disposition?: Unsure        Copied from CRM [id number]. Topic: Clinical - Red Word Triage >> Aug 03, 2024  3:19 PM Nathanel DEL wrote: Red Word that prompted transfer to Nurse Triage: pt woke up w/ something in her eye.   Like something in it.  Eye is swollen.  Not getting worse.  Pt is diabetic.  She has pulm hypertension.  Pt's heart dr and Bobbi are working together.  Pt needs appt asap.  Pt is out of medication an needs asap Reason for Disposition  [1] Foreign body sensation (feels like something is in there) AND [2] no improvement after irrigation (regardless of duration of flushing)  Answer Assessment - Initial Assessment Questions Patient states that she is on oxygen 24 hours a day and she states that if she takes her oxygen off her oxygen Patient states she took a painkiller to see if it would help her pain in her eye Patient states that she had an appointment his morning around 11 but she felt like something was in  her eye Eye feels swollen and she is covering it, difficulty to see out of it Patient was given a 3 PM appointment today and she states that her eye is still  On Oxygen 24 hours a day--2-3 liters Patient states that she is out of her medication--Sildenafil   Patient states a lot of coughing and she is taking cough medicine to help it Patient very upset about missing her appointments today with Dr Meade She is added to the schedule for tomorrow 08/04/2024 with Dr Meade at [facility] She is advised that at this time with her having severe eye pain, unable to open it, and feeling like something is in her eye it is recommended that she goes to the Emergency Room Patient agrees to go to the Emergency Room but states it may be a while before she has transportation--approximately 6:30PM  This RN offered to call an ambulance for her at this time but she refused Patient states she will go when she has a ride around 6:30PM    1. ONSET: When did the pain start? (e.g., minutes, hours, days)     When patient woke up this morning around 2-3am 2. TIMING: Does the pain come and go, or has it been constant since it started? (e.g., constant, intermittent, fleeting)     Constantly  feels like something is cutting her eye 3. SEVERITY: How bad is the pain?  (Scale 1-10; mild, moderate or severe)  10 4. LOCATION: Where does it hurt?  (e.g., eyelid, eye, cheekbone)     Right eye 5. CAUSE: What do you think is causing the pain?     unknown 6. VISION: Do you have blurred vision or changes in your vision?      Can't see out of it---patient states she has to keep it closed 7. EYE DISCHARGE: Is there any discharge (pus) from the eye(s)?  If Yes, ask: What color is it?      N/a 8. FEVER: Do you have a fever? If Yes, ask: What is it, how was it measured, and when did it start?      N/a 9. OTHER SYMPTOMS: Do you have any other symptoms? (e.g., headache, nasal discharge, facial rash)     Patient  denies  Protocols used: Eye Pain and Other Symptoms-A-AH

## 2024-08-03 NOTE — Telephone Encounter (Signed)
 Pt has an appt tomorrow. NFN

## 2024-08-04 ENCOUNTER — Encounter: Payer: Self-pay | Admitting: Internal Medicine

## 2024-08-04 ENCOUNTER — Ambulatory Visit (INDEPENDENT_AMBULATORY_CARE_PROVIDER_SITE_OTHER): Payer: Medicare (Managed Care) | Admitting: Internal Medicine

## 2024-08-04 VITALS — BP 106/64 | HR 102 | Temp 99.2°F | Ht 70.0 in | Wt 242.6 lb

## 2024-08-04 DIAGNOSIS — J9610 Chronic respiratory failure, unspecified whether with hypoxia or hypercapnia: Secondary | ICD-10-CM | POA: Diagnosis not present

## 2024-08-04 NOTE — Addendum Note (Signed)
 Addended by: Mishael Krysiak on: 08/04/2024 04:41 PM   Modules accepted: Level of Service
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
## 2024-08-04 NOTE — Patient Instructions (Addendum)
 It was a pleasure to see you today!  Please schedule follow up with Dr Annella in 3 months.  If my schedule is not open yet, we will contact you with a reminder closer to that time. Please call [phone] if you haven't heard from us  a month before, and always call us  sooner if issues or concerns arise. You can also send us  a message through MyChart, but but aware that this is not to be used for urgent issues and it may take up to 5-7 days to receive a reply. Please be aware that you will likely be able to view your results before I have a chance to respond to them. Please give us  5 business days to respond to any non-urgent results.    Follow up with cardiology  [phone]  I wil have you see Dr. Annella in our office to continue your care.  I want you to stay off the sildenafil  for now since it might make your dizziness worse and I'm not sure it would be appropriate.  Your nighttime coughing is secondary to reflux, agree with follow up with surgery.

## 2024-12-10 ENCOUNTER — Emergency Department (HOSPITAL_COMMUNITY): Payer: Medicare (Managed Care)

## 2024-12-10 ENCOUNTER — Observation Stay (HOSPITAL_COMMUNITY)
Admission: EM | Admit: 2024-12-10 | Discharge: 2024-12-12 | Disposition: A | Payer: Medicare (Managed Care) | Attending: Internal Medicine | Admitting: Internal Medicine

## 2024-12-10 ENCOUNTER — Other Ambulatory Visit: Payer: Self-pay

## 2024-12-10 ENCOUNTER — Encounter (HOSPITAL_COMMUNITY): Payer: Self-pay

## 2024-12-10 DIAGNOSIS — I272 Pulmonary hypertension, unspecified: Secondary | ICD-10-CM | POA: Insufficient documentation

## 2024-12-10 DIAGNOSIS — I5032 Chronic diastolic (congestive) heart failure: Secondary | ICD-10-CM | POA: Diagnosis not present

## 2024-12-10 DIAGNOSIS — K219 Gastro-esophageal reflux disease without esophagitis: Secondary | ICD-10-CM | POA: Insufficient documentation

## 2024-12-10 DIAGNOSIS — D649 Anemia, unspecified: Secondary | ICD-10-CM | POA: Insufficient documentation

## 2024-12-10 DIAGNOSIS — N3281 Overactive bladder: Secondary | ICD-10-CM | POA: Insufficient documentation

## 2024-12-10 DIAGNOSIS — Z7985 Long-term (current) use of injectable non-insulin antidiabetic drugs: Secondary | ICD-10-CM | POA: Diagnosis not present

## 2024-12-10 DIAGNOSIS — J449 Chronic obstructive pulmonary disease, unspecified: Secondary | ICD-10-CM | POA: Diagnosis not present

## 2024-12-10 DIAGNOSIS — R55 Syncope and collapse: Secondary | ICD-10-CM | POA: Diagnosis not present

## 2024-12-10 DIAGNOSIS — Z9981 Dependence on supplemental oxygen: Secondary | ICD-10-CM | POA: Diagnosis not present

## 2024-12-10 DIAGNOSIS — R519 Headache, unspecified: Secondary | ICD-10-CM | POA: Insufficient documentation

## 2024-12-10 DIAGNOSIS — E119 Type 2 diabetes mellitus without complications: Secondary | ICD-10-CM | POA: Diagnosis not present

## 2024-12-10 DIAGNOSIS — Z79899 Other long term (current) drug therapy: Secondary | ICD-10-CM | POA: Diagnosis not present

## 2024-12-10 DIAGNOSIS — I509 Heart failure, unspecified: Secondary | ICD-10-CM

## 2024-12-10 DIAGNOSIS — W1830XA Fall on same level, unspecified, initial encounter: Secondary | ICD-10-CM

## 2024-12-10 DIAGNOSIS — I110 Hypertensive heart disease with heart failure: Secondary | ICD-10-CM | POA: Insufficient documentation

## 2024-12-10 DIAGNOSIS — G894 Chronic pain syndrome: Secondary | ICD-10-CM | POA: Insufficient documentation

## 2024-12-10 DIAGNOSIS — F419 Anxiety disorder, unspecified: Secondary | ICD-10-CM | POA: Insufficient documentation

## 2024-12-10 DIAGNOSIS — S0990XA Unspecified injury of head, initial encounter: Secondary | ICD-10-CM

## 2024-12-10 DIAGNOSIS — J189 Pneumonia, unspecified organism: Secondary | ICD-10-CM | POA: Insufficient documentation

## 2024-12-10 LAB — CBC
HCT: 36.7 % (ref 36.0–46.0)
Hemoglobin: 10.6 g/dL — ABNORMAL LOW (ref 12.0–15.0)
MCH: 25.8 pg — ABNORMAL LOW (ref 26.0–34.0)
MCHC: 28.9 g/dL — ABNORMAL LOW (ref 30.0–36.0)
MCV: 89.3 fL (ref 80.0–100.0)
Platelets: 270 K/uL (ref 150–400)
RBC: 4.11 MIL/uL (ref 3.87–5.11)
RDW: 16.7 % — ABNORMAL HIGH (ref 11.5–15.5)
WBC: 6.5 K/uL (ref 4.0–10.5)
nRBC: 0 % (ref 0.0–0.2)

## 2024-12-10 LAB — COMPREHENSIVE METABOLIC PANEL WITH GFR
ALT: 5 U/L (ref 0–44)
AST: 16 U/L (ref 15–41)
Albumin: 3.8 g/dL (ref 3.5–5.0)
Alkaline Phosphatase: 115 U/L (ref 38–126)
Anion gap: 10 (ref 5–15)
BUN: 12 mg/dL (ref 8–23)
CO2: 29 mmol/L (ref 22–32)
Calcium: 9.1 mg/dL (ref 8.9–10.3)
Chloride: 99 mmol/L (ref 98–111)
Creatinine, Ser: 1.11 mg/dL — ABNORMAL HIGH (ref 0.44–1.00)
GFR, Estimated: 54 mL/min — ABNORMAL LOW
Glucose, Bld: 95 mg/dL (ref 70–99)
Potassium: 3.9 mmol/L (ref 3.5–5.1)
Sodium: 138 mmol/L (ref 135–145)
Total Bilirubin: 0.5 mg/dL (ref 0.0–1.2)
Total Protein: 7.4 g/dL (ref 6.5–8.1)

## 2024-12-10 LAB — URINALYSIS, ROUTINE W REFLEX MICROSCOPIC
Bilirubin Urine: NEGATIVE
Glucose, UA: NEGATIVE mg/dL
Hgb urine dipstick: NEGATIVE
Ketones, ur: NEGATIVE mg/dL
Leukocytes,Ua: NEGATIVE
Nitrite: NEGATIVE
Protein, ur: NEGATIVE mg/dL
Specific Gravity, Urine: 1.009 (ref 1.005–1.030)
pH: 6 (ref 5.0–8.0)

## 2024-12-10 LAB — RESP PANEL BY RT-PCR (RSV, FLU A&B, COVID)  RVPGX2
Influenza A by PCR: NEGATIVE
Influenza B by PCR: NEGATIVE
Resp Syncytial Virus by PCR: NEGATIVE
SARS Coronavirus 2 by RT PCR: NEGATIVE

## 2024-12-10 LAB — PRO BRAIN NATRIURETIC PEPTIDE: Pro Brain Natriuretic Peptide: 119 pg/mL

## 2024-12-10 LAB — TROPONIN T, HIGH SENSITIVITY
Troponin T High Sensitivity: <15 ng/L (ref 0–19)
Troponin T High Sensitivity: <15 ng/L (ref 0–19)

## 2024-12-10 MED ORDER — GABAPENTIN 300 MG PO CAPS
600.0000 mg | ORAL_CAPSULE | Freq: Three times a day (TID) | ORAL | Status: DC
  Administered 2024-12-10 – 2024-12-12 (×5): 600 mg via ORAL
  Filled 2024-12-10 (×6): qty 2

## 2024-12-10 MED ORDER — DOXYCYCLINE HYCLATE 100 MG PO TABS
100.0000 mg | ORAL_TABLET | Freq: Two times a day (BID) | ORAL | Status: DC
  Administered 2024-12-11: 100 mg via ORAL
  Filled 2024-12-10: qty 1

## 2024-12-10 MED ORDER — PANTOPRAZOLE SODIUM 40 MG PO TBEC
40.0000 mg | DELAYED_RELEASE_TABLET | Freq: Every day | ORAL | Status: DC
  Administered 2024-12-11 – 2024-12-12 (×2): 40 mg via ORAL
  Filled 2024-12-10 (×2): qty 1

## 2024-12-10 MED ORDER — OXYCODONE-ACETAMINOPHEN 10-325 MG PO TABS: 1.0000 | ORAL_TABLET | Freq: Four times a day (QID) | ORAL | Status: DC

## 2024-12-10 MED ORDER — ONDANSETRON HCL 4 MG/2ML IJ SOLN
4.0000 mg | Freq: Once | INTRAMUSCULAR | Status: AC
  Administered 2024-12-10: 4 mg via INTRAVENOUS
  Filled 2024-12-10: qty 2

## 2024-12-10 MED ORDER — SODIUM CHLORIDE 0.9 % IV SOLN
1.0000 g | Freq: Once | INTRAVENOUS | Status: AC
  Administered 2024-12-10: 1 g via INTRAVENOUS
  Filled 2024-12-10: qty 10

## 2024-12-10 MED ORDER — DOXYCYCLINE HYCLATE 100 MG PO TABS
100.0000 mg | ORAL_TABLET | Freq: Once | ORAL | Status: AC
  Administered 2024-12-10: 100 mg via ORAL
  Filled 2024-12-10: qty 1

## 2024-12-10 MED ORDER — ALPRAZOLAM 0.5 MG PO TABS
1.0000 mg | ORAL_TABLET | Freq: Three times a day (TID) | ORAL | Status: DC | PRN
  Administered 2024-12-11 – 2024-12-12 (×3): 1 mg via ORAL
  Filled 2024-12-10 (×3): qty 2

## 2024-12-10 MED ORDER — MORPHINE SULFATE (PF) 4 MG/ML IV SOLN
4.0000 mg | Freq: Once | INTRAVENOUS | Status: AC
  Administered 2024-12-10: 4 mg via INTRAVENOUS
  Filled 2024-12-10: qty 1

## 2024-12-10 MED ORDER — OXYCODONE-ACETAMINOPHEN 5-325 MG PO TABS
1.0000 | ORAL_TABLET | Freq: Four times a day (QID) | ORAL | Status: DC
  Administered 2024-12-10 – 2024-12-12 (×6): 1 via ORAL
  Filled 2024-12-10 (×6): qty 1

## 2024-12-10 MED ORDER — SODIUM CHLORIDE 0.9 % IV SOLN
1.0000 g | INTRAVENOUS | Status: DC
  Filled 2024-12-10: qty 10

## 2024-12-10 MED ORDER — HYDROCODONE-ACETAMINOPHEN 5-325 MG PO TABS
1.0000 | ORAL_TABLET | Freq: Once | ORAL | Status: AC
  Administered 2024-12-10: 1 via ORAL
  Filled 2024-12-10: qty 1

## 2024-12-10 MED ORDER — OXYCODONE HCL 5 MG PO TABS
5.0000 mg | ORAL_TABLET | Freq: Four times a day (QID) | ORAL | Status: DC
  Administered 2024-12-10 – 2024-12-12 (×7): 5 mg via ORAL
  Filled 2024-12-10 (×7): qty 1

## 2024-12-10 MED ORDER — OXYBUTYNIN CHLORIDE 5 MG PO TABS
5.0000 mg | ORAL_TABLET | Freq: Two times a day (BID) | ORAL | Status: DC
  Administered 2024-12-10 – 2024-12-12 (×4): 5 mg via ORAL
  Filled 2024-12-10 (×5): qty 1
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
## 2024-12-10 NOTE — ED Triage Notes (Signed)
 Pt BIB [facility] from doctor's office for multiple syncopal episodes and falls. Pt fell 3 days ago, per spouse mechanical fall, then had syncopal episode yesterday and fell per spouse and hit head. Pt went to doctor's office this morning and had witnessed syncopal episode by staff. Pt wears 2L O2 at baseline, pt reports pain down left side from fall. VSS per EMS.
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
## 2024-12-11 ENCOUNTER — Encounter (HOSPITAL_COMMUNITY): Payer: Self-pay | Admitting: Internal Medicine

## 2024-12-11 ENCOUNTER — Observation Stay (HOSPITAL_COMMUNITY): Payer: Medicare (Managed Care)

## 2024-12-11 DIAGNOSIS — R55 Syncope and collapse: Secondary | ICD-10-CM

## 2024-12-11 DIAGNOSIS — R42 Dizziness and giddiness: Secondary | ICD-10-CM

## 2024-12-11 LAB — URINE DRUG SCREEN
Amphetamines: NEGATIVE
Barbiturates: NEGATIVE
Benzodiazepines: POSITIVE — AB
Cocaine: NEGATIVE
Fentanyl: NEGATIVE
Methadone Scn, Ur: NEGATIVE
Opiates: NEGATIVE
Tetrahydrocannabinol: NEGATIVE

## 2024-12-11 LAB — ECHOCARDIOGRAM COMPLETE
AR max vel: 1.85 cm2
AV Area VTI: 1.92 cm2
AV Area mean vel: 1.86 cm2
AV Mean grad: 5 mmHg
AV Peak grad: 9.5 mmHg
Ao pk vel: 1.54 m/s
Area-P 1/2: 3.5 cm2
Calc EF: 54.3 %
Height: 70 in
MV VTI: 2.84 cm2
S' Lateral: 3.7 cm
Single Plane A2C EF: 50.9 %
Single Plane A4C EF: 56.3 %
Weight: 3920 [oz_av]

## 2024-12-11 LAB — GLUCOSE, CAPILLARY
Glucose-Capillary: 86 mg/dL (ref 70–99)
Glucose-Capillary: 101 mg/dL — ABNORMAL HIGH (ref 70–99)

## 2024-12-11 LAB — PROCALCITONIN: Procalcitonin: <0.1 ng/mL

## 2024-12-11 NOTE — Care Management Obs Status (Signed)
 MEDICARE OBSERVATION STATUS NOTIFICATION   Patient Details  Name: Teresa Wiley MRN: [id number] Date of Birth: 1956-02-02   Medicare Observation Status Notification Given:  Yes    Marval Gell, RN 12/11/2024, 4:00 PM

## 2024-12-11 NOTE — Progress Notes (Signed)
 " PROGRESS NOTE    Teresa Wiley  FMW:[id number] DOB: 01/22/1956 DOA: 12/10/2024 PCP: Cleotilde Bernardino Hutchinson, PA  Outpatient Specialists:     Brief Narrative:  As per H&P done on admission: Teresa Wiley is a 69 y.o. female with medical history significant of chronic hypoxemic hypercarbic respiratory failure on 2 L [place], ASD status post percutaneous closure 08/2022, HFpEF, pulmonary hypertension, COPD, hypertension, bronchomalacia, tobacco abuse, GERD, esophageal dysmotility, hiatal hernia, memory loss, anxiety, type 2 diabetes, CVA, chronic anemia, chronic pain syndrome, marijuana abuse, history of presyncope/syncope in the past felt to be due to polypharmacy presents to the ED today via EMS for evaluation of multiple recent episodes of presyncope/syncope.  Patient states she has had 3 falls in the past few days.  States the day before yesterday she was walking at home when she started feeling lightheaded and then fell hitting her head.  She is not sure if she lost consciousness.  Then yesterday she was cleaning her kitchen and then went back upstairs to her bedroom when she had another episode of presyncope/syncope while talking to her sister and fell on her left side.  States today she went to her doctor's office where she started feeling lightheaded again and fell.  She takes multiple sedating medications at home including Xanax , baclofen , gabapentin , and scheduled Percocet.  Also takes antihypertensives including Coreg  and Lasix .  Patient states she was previously taking sildenafil  for pulmonary hypertension 6 times a day but her pulmonologist has now stopped this medication.  Reports history of chronic cough and does not think this is any worse than her baseline.  Denies fevers, shortness of breath, chest pain, vomiting, diarrhea, or abdominal pain.   ED Course: Vital signs stable with EMS.  EKG showing normal sinus rhythm with mild PR prolongation and no acute ischemic changes.  Labs showing no  leukocytosis, hemoglobin 10.6 (stable), glucose 95, creatinine 1.1 (close to baseline), initial troponin negative and repeat pending, proBNP pending, COVID/influenza/RSV PCR pending, UA not suggestive of infection.  Chest x-ray showing bibasilar patchy opacities with small bilateral pleural effusions.  X-rays of left knee and left hip/pelvis showing no evidence of acute traumatic injuries.  CT head showing no acute intracranial abnormality.  Patient was given ceftriaxone , doxycycline , Norco, morphine , and Zofran .  12/11/2024: Patient seen alongside patient's wife and nurse.  Patient and patient's wife maintained that patient never passed out.  Patient's symptoms were attributed to hypoxia, with O2 sat in the 70s.  Apparently, patient's oxygen tubing was said to have kinked on 2 different occasions.  Patient and patient's wife also dwelled on the need for adequate pain control.  Patient remains to be drowsy.  Check orthostatic vital signs.  Complete workup.  Likely discharge back home in the next 24 hours.  PT OT consult.  TOC consult.   Assessment & Plan:   Principal Problem:   Syncope Active Problems:   CAP (community acquired pneumonia)   Pulmonary hypertension (HCC)   Chronic anemia   CHF (congestive heart failure) (HCC)   Multiple recent episodes of presyncope/syncope As per prior documentation: Not hypoglycemic.  CT head showing no acute intracranial abnormality.  EKG showing normal sinus rhythm with mild PR prolongation and no acute ischemic changes.  Troponin negative x 2.  Patient is not endorsing chest pain.  PE less likely given no tachycardia.  She is chronically on 2 L home oxygen and has no increasing oxygen requirement from baseline.  Polypharmacy could be contributing as she is on multiple sedating medications  including Xanax , baclofen , gabapentin , and oxycodone .  Discussed with the patient but she is very hesitant stopping or reducing dose of her home medications.  Will hold  baclofen  for now but continue remainder of her home medications for chronic anxiety and pain.  Will hold antihypertensives/diuretics at this time and check orthostatics.  Fall precautions.  Echocardiogram and UDS ordered. 12/11/2024: See above documentation.  Complete workup.  Orthostatic vital signs.  Adequate hydration.  Patient is on multiple mind altering medications.   Community-acquired pneumonia (ruled out) Patient is endorsing chronic cough.  Chest x-ray showing bibasilar patchy opacities.  No fever, leukocytosis, or signs of sepsis.  Stable on 2 L [place] which is her baseline.  COVID/influenza/RSV PCR negative.  Continue ceftriaxone  and doxycycline  at this time.  Check procalcitonin level. 12/11/2024: Procalcitonin is less than 0.1.  Will discontinue antibiotics.   Chronic HFpEF Last echo done in May 2025 showing EF 60 to 65%, grade 1 diastolic dysfunction, left atrium mildly dilated, trivial mitral regurgitation, trivial aortic regurgitation, and borderline dilation of the ascending aorta.  Chest x-ray showing small bilateral pleural effusions.  proBNP normal.  Repeat echocardiogram ordered given multiple recent episodes of syncope. 12/11/2024: Follow-up repeat echo.   Pulmonary hypertension Patient states she is no longer taking sildenafil . 12/11/2024: Patient follows up with pulmonary team (Dr. Meade)   GERD Continue Protonix .   Anxiety Continue Xanax  PRN.   Type 2 diabetes Patient is on weekly semaglutide  injections.  Last hemoglobin A1c 4.8 in May 2025.   Chronic pain syndrome Baclofen  held as above.  Continue home gabapentin  and Percocet.   Overactive bladder Continue oxybutynin .   Chronic anemia Hemoglobin stable.   Home medications initiated per patient's reported history.  Awaiting pharmacy med rec for verification.      DVT prophylaxis: SCD Code Status: Full code Family Communication: Wife by bedside Disposition Plan: Likely discharge tomorrow   Consultants:   None  Procedures:  None  Antimicrobials:  Discontinue antibiotics   Subjective: No new complaints. Continues to suggest need for more pain medications. No fever or chills.  Objective: Vitals:   12/11/24 0800 12/11/24 0815 12/11/24 0921 12/11/24 0947  BP: (!) 147/80  (!) 163/88   Pulse:   83   Resp: 13 12 18    Temp:    98.1 F (36.7 C)  TempSrc:    Oral  SpO2:   100%   Weight:      Height:       No intake or output data in the 24 hours ending 12/11/24 1110 Filed Weights   12/10/24 1542  Weight: 111.1 kg    Examination:  General exam: Appears calm and comfortable.  Patient is mildly drowsy. Respiratory system: Clear to auscultation. Respiratory effort normal. Cardiovascular system: S1 & S2 heard. Gastrointestinal system: Abdomen is obese, soft and nontender.   Central nervous system: Awake.  Slightly drowsy.    Data Reviewed: I have personally reviewed following labs and imaging studies  CBC: Recent Labs  Lab 12/10/24 1550  WBC 6.5  HGB 10.6*  HCT 36.7  MCV 89.3  PLT 270   Basic Metabolic Panel: Recent Labs  Lab 12/10/24 1550  NA 138  K 3.9  CL 99  CO2 29  GLUCOSE 95  BUN 12  CREATININE 1.11*  CALCIUM  9.1   GFR: Estimated Creatinine Clearance: 66.4 mL/min (A) (by C-G formula based on SCr of 1.11 mg/dL (H)). Liver Function Tests: Recent Labs  Lab 12/10/24 1550  AST 16  ALT 5  ALKPHOS  115  BILITOT 0.5  PROT 7.4  ALBUMIN  3.8   No results for input(s): LIPASE, AMYLASE in the last 168 hours. No results for input(s): AMMONIA in the last 168 hours. Coagulation Profile: No results for input(s): INR, PROTIME in the last 168 hours. Cardiac Enzymes: No results for input(s): CKTOTAL, CKMB, CKMBINDEX, TROPONINI in the last 168 hours. BNP (last 3 results) Recent Labs    12/10/24 1929  PROBNP 119.0   HbA1C: No results for input(s): HGBA1C in the last 72 hours. CBG: No results for input(s): GLUCAP in the last 168  hours. Lipid Profile: No results for input(s): CHOL, HDL, LDLCALC, TRIG, CHOLHDL, LDLDIRECT in the last 72 hours. Thyroid  Function Tests: No results for input(s): TSH, T4TOTAL, FREET4, T3FREE, THYROIDAB in the last 72 hours. Anemia Panel: No results for input(s): VITAMINB12, FOLATE, FERRITIN, TIBC, IRON , RETICCTPCT in the last 72 hours. Urine analysis:    Component Value Date/Time   COLORURINE YELLOW 12/10/2024 1946   APPEARANCEUR CLOUDY (A) 12/10/2024 1946   LABSPEC 1.009 12/10/2024 1946   PHURINE 6.0 12/10/2024 1946   GLUCOSEU NEGATIVE 12/10/2024 1946   HGBUR NEGATIVE 12/10/2024 1946   BILIRUBINUR NEGATIVE 12/10/2024 1946   KETONESUR NEGATIVE 12/10/2024 1946   PROTEINUR NEGATIVE 12/10/2024 1946   UROBILINOGEN 1.0 12/07/2014 0200   NITRITE NEGATIVE 12/10/2024 1946   LEUKOCYTESUR NEGATIVE 12/10/2024 1946   Sepsis Labs: @LABRCNTIP (procalcitonin:4,lacticidven:4)  ) Recent Results (from the past 240 hours)  Resp panel by RT-PCR (RSV, Flu A&B, Covid) Anterior Nasal Swab     Status: None   Collection Time: 12/10/24  7:31 PM   Specimen: Anterior Nasal Swab  Result Value Ref Range Status   SARS Coronavirus 2 by RT PCR NEGATIVE NEGATIVE Final   Influenza A by PCR NEGATIVE NEGATIVE Final   Influenza B by PCR NEGATIVE NEGATIVE Final    Comment: (NOTE) The Xpert Xpress SARS-CoV-2/FLU/RSV plus assay is intended as an aid in the diagnosis of influenza from Nasopharyngeal swab specimens and should not be used as a sole basis for treatment. Nasal washings and aspirates are unacceptable for Xpert Xpress SARS-CoV-2/FLU/RSV testing.  Fact Sheet for Patients: bloggercourse.com  Fact Sheet for Healthcare Providers: seriousbroker.it  This test is not yet approved or cleared by the [place]  FDA and has been authorized for detection and/or diagnosis of SARS-CoV-2 by FDA under an Emergency Use  Authorization (EUA). This EUA will remain in effect (meaning this test can be used) for the duration of the COVID-19 declaration under Section 564(b)(1) of the Act, 21 U.S.C. section 360bbb-3(b)(1), unless the authorization is terminated or revoked.     Resp Syncytial Virus by PCR NEGATIVE NEGATIVE Final    Comment: (NOTE) Fact Sheet for Patients: bloggercourse.com  Fact Sheet for Healthcare Providers: seriousbroker.it  This test is not yet approved or cleared by the [place]  FDA and has been authorized for detection and/or diagnosis of SARS-CoV-2 by FDA under an Emergency Use Authorization (EUA). This EUA will remain in effect (meaning this test can be used) for the duration of the COVID-19 declaration under Section 564(b)(1) of the Act, 21 U.S.C. section 360bbb-3(b)(1), unless the authorization is terminated or revoked.  Performed at [facility], [address], [place], [place] [address]          Radiology Studies: CT Head Wo Contrast Result Date: 12/10/2024 EXAM: CT HEAD WITHOUT CONTRAST 12/10/2024 06:33:17 PM TECHNIQUE: CT of the head was performed without the administration of intravenous contrast. Automated exposure control, iterative reconstruction, and/or weight based  adjustment of the mA/kV was utilized to reduce the radiation dose to as low as reasonably achievable. COMPARISON: 04/07/2024 CLINICAL HISTORY: Head trauma, minor (Age >= 65y). Multiple syncopal episodes and falls in the last few days. FINDINGS: BRAIN AND VENTRICLES: No acute hemorrhage. No evidence of acute infarct. No hydrocephalus. No extra-axial collection. No mass effect or midline shift. ORBITS: No acute abnormality. SINUSES: No acute abnormality. SOFT TISSUES AND SKULL: No acute soft tissue abnormality. No skull fracture. IMPRESSION: 1. No acute intracranial abnormality. Normal CT of the head for age Electronically signed by: Lonni Necessary MD 12/10/2024 07:06 PM EST RP Workstation: [id number]   DG Hip Unilat W or Wo Pelvis 2-3 Views Left Result Date: 12/10/2024 EXAM: 2 or 3 VIEW(S) XRAY OF THE UNILATERAL HIP 12/10/2024 05:42:00 PM COMPARISON: None available. CLINICAL HISTORY: fall left hip pain FINDINGS: BONES AND JOINTS: No acute fracture. No malalignment. Mild degenerative changes of bilateral hips. LUMBAR SPINE: Degenerative changes of visualized lower lumbar spine. SOFT TISSUES: Phleboliths in pelvis. IMPRESSION: 1. No acute fracture or dislocation. 2. Mild degenerative changes of bilateral hips. Electronically signed by: Greig Pique MD 12/10/2024 06:01 PM EST RP Workstation: [id number]   DG Knee Complete 4 Views Left Result Date: 12/10/2024 EXAM: 4 OR MORE VIEW(S) XRAY OF THE LEFT KNEE 12/10/2024 05:42:00 PM COMPARISON: None available. CLINICAL HISTORY: fall FINDINGS: BONES AND JOINTS: No acute fracture. No malalignment. No significant joint effusion. No significant degenerative changes. SOFT TISSUES: Unremarkable. IMPRESSION: 1. No evidence of acute traumatic injury. Electronically signed by: Greig Pique MD 12/10/2024 06:00 PM EST RP Workstation: [id number]   DG Chest Portable 1 View Result Date: 12/10/2024 EXAM: 1 VIEW(S) XRAY OF THE CHEST 12/10/2024 04:24:00 PM COMPARISON: 05/23/2024 CLINICAL HISTORY: loc FINDINGS: LUNGS AND PLEURA: Bibasilar patchy opacities. Blunting of bilateral costophrenic angles. No pneumothorax. HEART AND MEDIASTINUM: Amplatzer device over right heart. No acute abnormality of the cardiac and mediastinal silhouettes. BONES AND SOFT TISSUES: No acute osseous abnormality. IMPRESSION: 1. Bibasilar patchy opacities with small bilateral pleural effusions. Electronically signed by: Greig Pique MD 12/10/2024 04:41 PM EST RP Workstation: [id number]        Scheduled Meds:  doxycycline   100 mg Oral Q12H   gabapentin   600 mg Oral TID   oxybutynin   5 mg Oral BID   oxyCODONE -acetaminophen   1 tablet Oral  Q6H   And   oxyCODONE   5 mg Oral Q6H   pantoprazole   40 mg Oral Daily   Continuous Infusions:  cefTRIAXone  (ROCEPHIN )  IV       LOS: 0 days    Time spent: 55 minutes.    Leatrice Chapel, MD  [facility] 7PM-7AM contact night coverage as above    "

## 2024-12-11 NOTE — Evaluation (Signed)
 Occupational Therapy Evaluation Patient Details Name: Teresa Wiley MRN: [id number] DOB: [birth year] Today's Date: 12/11/2024   History of Present Illness   69 y.o. female presents 12/10/24 for multiple recent episodes of presyncope/syncope. Chest x-ray showing bibasilar patchy opacities with small bilateral pleural effusions.  X-rays of left knee and left hip/pelvis showing no evidence of acute traumatic injuries. CT head showing no acute intracranial abnormality. PMH: chronic hypoxemic hypercarbic respiratory failure on 2 L [place], ASD status post percutaneous closure 08/2022, HFpEF, pulmonary hypertension, COPD, hypertension, bronchomalacia, tobacco abuse, GERD, esophageal dysmotility, hiatal hernia, memory loss, anxiety, type 2 diabetes, CVA, chronic anemia, chronic pain syndrome, marijuana abuse, history of presyncope/syncope in the past felt to be due to polypharmacy     Clinical Impressions PTA Pt reports that she was independent with ADLs/IADLs and functional mobility. Pt currently requires up to Max A for ADL engagement and up to Min A for functional transfers. Pt currently unsafe with independent mobility, impulsive with transfers and decreased safety awareness as noted below. Orthostatics attempted during session however Pt with increased unsteadiness in standing and emotional response to pain limited task. Pt is primarily limited by pain, generalized weakness, decreased safety awareness, unsteadiness on feet. OT to continue to follow Pt acutely to facilitate progress towards goals. At this time, Pt will benefit from continued inpatient follow up therapy, <3 hours/day pending pain management and Pt progress with acute therapies.      If plan is discharge home, recommend the following:   A little help with walking and/or transfers;A lot of help with bathing/dressing/bathroom;Assistance with cooking/housework;Assist for transportation;Help with stairs or ramp for entrance     Functional  Status Assessment   Patient has had a recent decline in their functional status and demonstrates the ability to make significant improvements in function in a reasonable and predictable amount of time.     Equipment Recommendations   BSC/3in1     Recommendations for Other Services         Precautions/Restrictions   Precautions Precautions: Fall Recall of Precautions/Restrictions: Impaired Restrictions Weight Bearing Restrictions Per Provider Order: No     Mobility Bed Mobility Overal bed mobility: Needs Assistance Bed Mobility: Supine to Sit, Sit to Supine     Supine to sit: Contact guard Sit to supine: Contact guard assist   General bed mobility comments: CGA for safety. Pt able to come to EOB on R side with increased time and use of bed adjustments. Pt with increased pain during mobility. Impulsivity noted during transfer. Pt required verbal cue to scoot to EOB    Transfers Overall transfer level: Needs assistance Equipment used: None Transfers: Sit to/from Stand Sit to Stand: Min assist           General transfer comment: Pt with increased impulsivity during transfers. While seated EOB, Pt stood without RW or OT hands-on assisting. OT came to support Pt to complete standing. Instructed Pt to sit as she became unsteady but Pt did not respond to instruction. Pt kept repeating I cannot walk anywhere while crying. OT instructed Pt to side step towards HOB and then sit down. Pt required repeated instruction to complete task. Noteable unsteadiness on feet likely due to pain. Pt becoming emotional after mobility, crying.      Balance Overall balance assessment: Needs assistance Sitting-balance support: Single extremity supported, Feet supported Sitting balance-Leahy Scale: Fair     Standing balance support: No upper extremity supported, During functional activity Standing balance-Leahy Scale: Poor Standing balance comment: Dependent on external  support to  maintain balance                           ADL either performed or assessed with clinical judgement   ADL Overall ADL's : Needs assistance/impaired Eating/Feeding: Independent   Grooming: Independent;Sitting   Upper Body Bathing: Minimal assistance;Sitting   Lower Body Bathing: Maximal assistance   Upper Body Dressing : Set up;Sitting   Lower Body Dressing: Maximal assistance   Toilet Transfer: Minimal assistance;Stand-pivot;BSC/3in1   Toileting- Clothing Manipulation and Hygiene: Minimal assistance               Vision Patient Visual Report: No change from baseline       Perception         Praxis         Pertinent Vitals/Pain Pain Assessment Pain Assessment: 0-10 Pain Score: 9  Pain Location: L hip down to foot Pain Descriptors / Indicators: Grimacing, Guarding, Constant, Discomfort, Crying, Moaning Pain Intervention(s): Limited activity within patient's tolerance, Monitored during session, Premedicated before session     Extremity/Trunk Assessment Upper Extremity Assessment Upper Extremity Assessment: Generalized weakness   Lower Extremity Assessment Lower Extremity Assessment: Defer to PT evaluation       Communication Communication Communication: No apparent difficulties   Cognition Arousal: Alert Behavior During Therapy: Impulsive, Anxious Cognition: No apparent impairments             OT - Cognition Comments: Cognition appears WFL, believe that pain is limiting factor impacting Pt ability to follow commands this date                 Following commands: Impaired Following commands impaired: Follows one step commands inconsistently, Follows multi-step commands inconsistently     Cueing  General Comments   Cueing Techniques: Verbal cues;Visual cues  Attempted to complete orthostatic vitals during session however impulsivity and unsteadiness on Pt during standing limited task. Pt respectfully declined second standing  attempt to obtain vitals.   Exercises     Shoulder Instructions      Home Living Family/patient expects to be discharged to:: Private residence Living Arrangements: Spouse/significant other;Other relatives (grandson (75 y.o.)) Available Help at Discharge: Family Type of Home: House Home Access: Stairs to enter Secretary/administrator of Steps: 3 Entrance Stairs-Rails: None Home Layout: Two level;Bed/bath upstairs Alternate Level Stairs-Number of Steps: 14   Bathroom Shower/Tub: Tub/shower unit         Home Equipment: Rollator (4 wheels);Cane - single point;Other (comment);Grab bars - toilet;Grab bars - tub/shower;Hand held shower head;Wheelchair - manual (on 2-3L O2 at home)          Prior Functioning/Environment Prior Level of Function : History of Falls (last six months);Independent/Modified Independent             Mobility Comments: 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 ADLs Comments: 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    OT Problem List: Decreased strength;Decreased activity tolerance;Impaired balance (sitting and/or standing);Decreased safety awareness;Decreased knowledge of use of DME or AE;Decreased knowledge of precautions;Pain   OT Treatment/Interventions: Self-care/ADL training;Therapeutic exercise;Energy conservation;DME and/or AE instruction;Therapeutic activities;Patient/family education;Balance training      OT Goals(Current goals can be found in the care plan section)   Acute Rehab OT Goals Patient Stated Goal: to decrease pain OT Goal Formulation: With patient Time For Goal Achievement: 12/25/24 Potential to Achieve Goals: Good ADL Goals Pt Will Perform Lower Body Bathing: with set-up;with adaptive equipment Pt Will Perform Lower Body Dressing: with min assist;with adaptive equipment Pt Will Transfer to  Toilet: with modified independence;stand pivot transfer;bedside commode Pt/caregiver will Perform Home Exercise Program: Increased strength;With written HEP provided;Both right  and left upper extremity Additional ADL Goal #1: Pt will return demonstration of at least 2 EC strategies into ADL routine with supervision   OT Frequency:  Min 2X/week    Co-evaluation              AM-PAC OT 6 Clicks Daily Activity     Outcome Measure Help from another person eating meals?: None Help from another person taking care of personal grooming?: None Help from another person toileting, which includes using toliet, bedpan, or urinal?: A Little Help from another person bathing (including washing, rinsing, drying)?: A Lot Help from another person to put on and taking off regular upper body clothing?: A Little Help from another person to put on and taking off regular lower body clothing?: A Lot 6 Click Score: 18   End of Session Nurse Communication: Mobility status  Activity Tolerance: Patient limited by pain Patient left: in bed;with call bell/phone within reach  OT Visit Diagnosis: Unsteadiness on feet (R26.81);Repeated falls (R29.6);Muscle weakness (generalized) (M62.81);History of falling (Z91.81);Pain                Time: 8475-8453 OT Time Calculation (min): 22 min Charges:  OT General Charges $OT Visit: 1 Visit OT Evaluation $OT Eval Low Complexity: 1 Low  Maurilio CROME, OTR/L.  [facility]  Office: [phone]   Maurilio PARAS Efraim Vanallen 12/11/2024, 4:09 PM

## 2024-12-11 NOTE — Care Plan (Signed)
 Pt admitted for syncopal episode.BP (!) 140/95 (BP Location: Right Arm)   Pulse 85   Temp 98.3 F (36.8 C) (Oral)   Resp 18   Ht 5' 10 (1.778 m)   Wt 111.1 kg   SpO2 92%   BMI 35.15 kg/m  Blood sugar 86 Pt on tele NSR, no skin issues. Slight bruising on left side. Pt oriented to floor, bed in lowest position, wheels locked, 2/4 rails up call bell nearby. Pt educated on timing of pain and anxiety meds. No other needs voiced at this time. Teresa Wiley 12/11/24 2:09 PM

## 2024-12-11 NOTE — ED Notes (Signed)
 Attempted to call 6C to alert of patient coming upstairs, however, no answer.

## 2024-12-12 DIAGNOSIS — R55 Syncope and collapse: Secondary | ICD-10-CM | POA: Diagnosis not present

## 2024-12-12 LAB — RENAL FUNCTION PANEL
Albumin: 3.5 g/dL (ref 3.5–5.0)
Anion gap: 11 (ref 5–15)
BUN: 10 mg/dL (ref 8–23)
CO2: 27 mmol/L (ref 22–32)
Calcium: 8.8 mg/dL — ABNORMAL LOW (ref 8.9–10.3)
Chloride: 103 mmol/L (ref 98–111)
Creatinine, Ser: 0.9 mg/dL (ref 0.44–1.00)
GFR, Estimated: >60 mL/min
Glucose, Bld: 114 mg/dL — ABNORMAL HIGH (ref 70–99)
Phosphorus: 3.4 mg/dL (ref 2.5–4.6)
Potassium: 3.9 mmol/L (ref 3.5–5.1)
Sodium: 140 mmol/L (ref 135–145)

## 2024-12-12 LAB — CBC WITH DIFFERENTIAL/PLATELET
Abs Immature Granulocytes: 0.01 K/uL (ref 0.00–0.07)
Basophils Absolute: 0 K/uL (ref 0.0–0.1)
Basophils Relative: 0 %
Eosinophils Absolute: 0.3 K/uL (ref 0.0–0.5)
Eosinophils Relative: 6 %
HCT: 36.8 % (ref 36.0–46.0)
Hemoglobin: 10.8 g/dL — ABNORMAL LOW (ref 12.0–15.0)
Immature Granulocytes: 0 %
Lymphocytes Relative: 29 %
Lymphs Abs: 1.5 K/uL (ref 0.7–4.0)
MCH: 25.7 pg — ABNORMAL LOW (ref 26.0–34.0)
MCHC: 29.3 g/dL — ABNORMAL LOW (ref 30.0–36.0)
MCV: 87.4 fL (ref 80.0–100.0)
Monocytes Absolute: 0.9 K/uL (ref 0.1–1.0)
Monocytes Relative: 17 %
Neutro Abs: 2.4 K/uL (ref 1.7–7.7)
Neutrophils Relative %: 48 %
Platelets: 278 K/uL (ref 150–400)
RBC: 4.21 MIL/uL (ref 3.87–5.11)
RDW: 17.2 % — ABNORMAL HIGH (ref 11.5–15.5)
WBC: 5.1 K/uL (ref 4.0–10.5)
nRBC: 0 % (ref 0.0–0.2)

## 2024-12-12 LAB — MAGNESIUM: Magnesium: 2 mg/dL (ref 1.7–2.4)

## 2024-12-12 MED ORDER — GUAIFENESIN-DM 100-10 MG/5ML PO SYRP
5.0000 mL | ORAL_SOLUTION | Freq: Once | ORAL | Status: AC
  Administered 2024-12-12: 5 mL via ORAL
  Filled 2024-12-12: qty 5

## 2024-12-12 MED ORDER — CARVEDILOL 3.125 MG PO TABS: 3.1250 mg | ORAL_TABLET | Freq: Two times a day (BID) | ORAL | 1 refills | Status: AC

## 2024-12-12 NOTE — Progress Notes (Addendum)
" °   12/12/24 0400  Vitals  Temp 98.6 F (37 C)  Temp Source Oral  BP (!) 96/42  MAP (mmHg) (!) 59  BP Location Left Arm  BP Method Automatic  Patient Position (if appropriate) Lying  Pulse Rate 87  Pulse Rate Source Dinamap  Resp 17  Level of Consciousness  Level of Consciousness Alert  MEWS COLOR  MEWS Score Color Green  Orthostatic Lying   BP- Lying 96/42  Pulse- Lying 87  Orthostatic Sitting  BP- Sitting 104/68  Pulse- Sitting 81  Orthostatic Standing at 0 minutes  BP- Standing at 0 minutes 90/74  Pulse- Standing at 0 minutes 101  MEWS Score  MEWS Temp 0  MEWS Systolic 1  MEWS Pulse 0  MEWS RR 0  MEWS LOC 0  MEWS Score 1   Unable to obtain standing at 3 minutes due to patient feeling lightheaded and unsteady. "

## 2024-12-12 NOTE — Discharge Summary (Signed)
 Physician Discharge Summary  Patient ID: Teresa Wiley MRN: [id number] DOB/AGE: 1956-09-21 69 y.o.  Admit date: 12/10/2024 Discharge date: 12/12/2024  Admission Diagnoses:  Discharge Diagnoses:  Principal Problem:   Syncope Active Problems:   CAP (community acquired pneumonia)   Pulmonary hypertension (HCC)   Chronic anemia   CHF (congestive heart failure) (HCC)   Discharged Condition: {condition:18240}  Hospital Course: ***  Consults: {consultation:18241}  Significant Diagnostic Studies: {diagnostics:18242}  Treatments: {Tx:18249}  Discharge Exam: Blood pressure 116/69, pulse 75, temperature 97.8 F (36.6 C), temperature source Oral, resp. rate 17, height 5' 10 (1.778 m), weight 111.1 kg, SpO2 100%. {physical zkjf:6958869}  Disposition: Discharge disposition: 01-Home or Self Care       Discharge Instructions     Increase activity slowly   Complete by: As directed       Allergies as of 12/12/2024       Reactions   Belbuca  [buprenorphine  Hcl] Hives   E.e.s. [erythromycin] Anaphylaxis, Nausea And Vomiting   Iodine  Hives, Swelling, Other (See Comments)   Throat swelling   Shellfish Allergy Anaphylaxis   Wound Dressing Adhesive Hives, Itching, Rash   PAPER TAPE ONLY   Butrans  [buprenorphine ] Other (See Comments)   Patient stated the first time she used the patch she was talking to family just fine and the next thing she knew they were standing over me like I was dying   Latex Other (See Comments)   Powder in the latex and balloons.   Porcine (pork) Protein-containing Drug Products Other (See Comments)   Religious   Other Rash   Electrodes stickers        Medication List     STOP taking these medications    baclofen  10 MG tablet Commonly known as: LIORESAL    furosemide  40 MG tablet Commonly known as: LASIX    Ibsrela 50 MG Tabs Generic drug: Tenapanor HCl   Voltaren  Arthritis Pain 1 % Gel Generic drug: diclofenac  Sodium       TAKE  these medications    ALPRAZolam  1 MG tablet Commonly known as: XANAX  Take 1 mg by mouth 3 (three) times daily as needed for anxiety.   carvedilol  3.125 MG tablet Commonly known as: COREG  Take 1 tablet (3.125 mg total) by mouth 2 (two) times daily. What changed:  medication strength how much to take   gabapentin  600 MG tablet Commonly known as: NEURONTIN  Take 600 mg by mouth 3 (three) times daily.   oxybutynin  5 MG tablet Commonly known as: DITROPAN  Take 5 mg by mouth 2 (two) times daily.   oxyCODONE -acetaminophen  10-325 MG tablet Commonly known as: PERCOCET Take 1 tablet by mouth in the morning, at noon, in the evening, and at bedtime.   OXYGEN Inhale 2 L into the lungs continuous.   pantoprazole  40 MG tablet Commonly known as: PROTONIX  Take 1 tablet (40 mg total) by mouth daily.   Semaglutide  (2 MG/DOSE) 8 MG/3ML Sopn Inject 2 mg into the skin once a week. On Thursday   Vitamin D  (Ergocalciferol ) 1.25 MG (50000 UNIT) Caps capsule Commonly known as: DRISDOL  Take 50,000 Units by mouth once a week. On Wednesday         Signed: Leatrice LILLETTE Chapel 12/12/2024, 2:27 PM

## 2024-12-12 NOTE — TOC Transition Note (Signed)
 Transition of Care [facility]) - Discharge Note   Patient Details  Name: Teresa Wiley MRN: [id number] Date of Birth: May 14, 1956  [facility] CM/SW Contact:  Marval Gell, RN Phone Number: 12/12/2024, 2:43 PM   Clinical Narrative:     Beatris w patient at bedside. She states she all recommended DME at home. She is agreeable to [facility] services, no preference for provider. [facility] accepted, hub and AVS udated  Final next level of care: Home w Home Health Services Barriers to Discharge: No Barriers Identified   Patient Goals and CMS Choice Patient states their goals for this hospitalization and ongoing recovery are:: to go home CMS Medicare.gov Compare Post Acute Care list provided to:: Patient Choice offered to / list presented to : Patient      Discharge Placement                       Discharge Plan and Services Additional resources added to the After Visit Summary for                  DME Arranged: N/A         HH Arranged: PT, OT HH Agency: [facility] Date [facility] Agency Contacted: 12/12/24 Time HH Agency Contacted: [phone] Representative spoke with at [facility] Agency: Darleene  Social Drivers of Health (SDOH) Interventions SDOH Screenings   Food Insecurity: Patient Declined (12/11/2024)  Housing: Patient Declined (12/11/2024)  Transportation Needs: Patient Declined (12/11/2024)  Utilities: Patient Declined (12/11/2024)  Social Connections: Patient Declined (12/11/2024)  Tobacco Use: Low Risk (12/11/2024)     Readmission Risk Interventions     No data to display

## 2024-12-12 NOTE — Plan of Care (Signed)
 [name], BP 116/69 (BP Location: Left Arm)   Pulse 75   Temp 97.8 F (36.6 C) (Oral)   Resp 17   Ht 5' 10 (1.778 m)   Wt 111.1 kg   SpO2 100%   BMI 35.15 kg/m  IV removed, tele removed personal belongings returned. AVS reviewed follow up questions answered. No other needs voiced at this time. Teresa Wiley 12/12/24 2:34 PM

## 2024-12-12 NOTE — Discharge Instructions (Signed)
 SABRA

## 2024-12-12 NOTE — Evaluation (Signed)
 Physical Therapy Evaluation Patient Details Name: Teresa Wiley MRN: [id number] DOB: 08-May-1956 Today's Date: 12/12/2024  History of Present Illness  69 y.o. female presents 12/10/24 for multiple recent episodes of presyncope/syncope. Chest x-ray showing bibasilar patchy opacities with small bilateral pleural effusions.  X-rays of left knee and left hip/pelvis showing no evidence of acute traumatic injuries. CT head showing no acute intracranial abnormality. PMH: chronic hypoxemic hypercarbic respiratory failure on 2 L [place], ASD status post percutaneous closure 08/2022, HFpEF, pulmonary hypertension, COPD, hypertension, bronchomalacia, tobacco abuse, GERD, esophageal dysmotility, hiatal hernia, memory loss, anxiety, type 2 diabetes, CVA, chronic anemia, chronic pain syndrome, marijuana abuse, history of presyncope/syncope in the past felt to be due to polypharmacy.   Clinical Impression  Pt in bed upon arrival of PT, agreeable to evaluation at this time. Prior to admission the pt reports she was independent without use of DME, uses 2-3L O2 at home, is largely sedentary in bed, but able to ambulate short distances in the house and complete 14 stairs to reach her 2nd floor bedroom with use of rail. The pt was able to complete bed mobility without assistance, but needed up to minA to maintain static standing balance due to frequent posterior lean needing minA. The pt was able to complete standing marches at EOB with minA again due to posterior LOB, but declined further ambulation due to headache and fatigue. Discussed mobility at home and stair management extensively with the patient, recommend use of gait belt and assist from family when navigating the stairs given the pt's balance deficits with stance at this time. Pt resistant to education, reporting initially that she just leans on the wall to avoid falling, and that she uses the hallway railing for support. Pt with deficits in awareness, problem solving,  memory, and insight to deficits in session, recommend assist for OOB mobility as well as continued skilled PT after return home. Will continue to follow acutely to progress ambulation and stairs as tolerated.   VITALS:  - supine in bed- BP: 152/90 (108); HR: 79bpm - sitting EOB - BP: 144/81 (101); HR: 82bpm - standing - BP: 143/92 (106); HR: 97bpm - standing after 3 min - BP: 148/94 (108); HR: 87bpm      If plan is discharge home, recommend the following: A lot of help with walking and/or transfers;A lot of help with bathing/dressing/bathroom;Assistance with cooking/housework;Direct supervision/assist for medications management;Direct supervision/assist for financial management;Assist for transportation;Help with stairs or ramp for entrance;Supervision due to cognitive status   Can travel by private vehicle        Equipment Recommendations Rolling walker (2 wheels)  Recommendations for Other Services       Functional Status Assessment Patient has had a recent decline in their functional status and demonstrates the ability to make significant improvements in function in a reasonable and predictable amount of time.     Precautions / Restrictions Precautions Precautions: Fall Recall of Precautions/Restrictions: Impaired Restrictions Weight Bearing Restrictions Per Provider Order: No      Mobility  Bed Mobility Overal bed mobility: Needs Assistance Bed Mobility: Supine to Sit, Sit to Supine     Supine to sit: Contact guard Sit to supine: Contact guard assist   General bed mobility comments: increased time but no assist, BP stable    Transfers Overall transfer level: Needs assistance Equipment used: None Transfers: Sit to/from Stand Sit to Stand: Min assist           General transfer comment: pt able to rise to standing  without assist, minA at times due to posterior lean when standing for orthostatic BP.    Ambulation/Gait Ambulation/Gait assistance: Min assist    Assistive device: None       Pre-gait activities: stnading marches x 20 General Gait Details: limited to standing marches needing minA due to posterior lean, minimal foot clearance. pt fatigued after ~10 and declined further ambulation due to blurry vision (corrected by putting on glasses) and headache  Stairs Stairs:  (attempted to discuss and pt declined use of gait belt or assist)            Balance Overall balance assessment: Needs assistance Sitting-balance support: Single extremity supported, Feet supported Sitting balance-Leahy Scale: Fair     Standing balance support: No upper extremity supported, During functional activity Standing balance-Leahy Scale: Poor Standing balance comment: Dependent on external support to maintain balance, posterior LOB needing minA to correct                             Pertinent Vitals/Pain Pain Assessment Pain Assessment: 0-10 Pain Score: 10-Worst pain ever Pain Location: L hip down to foot Pain Descriptors / Indicators: Grimacing, Guarding, Constant, Discomfort, Crying, Moaning Pain Intervention(s): Limited activity within patient's tolerance, Monitored during session    Home Living Family/patient expects to be discharged to:: Private residence Living Arrangements: Spouse/significant other;Other relatives (grandson (3 y.o.)) Available Help at Discharge: Family Type of Home: House Home Access: Stairs to enter Entrance Stairs-Rails: None Entrance Stairs-Number of Steps: 3 Alternate Level Stairs-Number of Steps: 14 Home Layout: Two level;Bed/bath upstairs Home Equipment: Rollator (4 wheels);Cane - single point;Other (comment);Grab bars - toilet;Grab bars - tub/shower;Hand held shower head;Wheelchair - manual (on 2-3L O2 at home)      Prior Function Prior Level of Function : History of Falls (last six months);Independent/Modified Independent             Mobility Comments: reports spending most of her time in bed,  3 recent falls ADLs Comments: 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, works physicist, medical plays     Extremity/Trunk Assessment   Upper Extremity Assessment Upper Extremity Assessment: Defer to OT evaluation;Generalized weakness    Lower Extremity Assessment Lower Extremity Assessment: Generalized weakness    Cervical / Trunk Assessment Cervical / Trunk Assessment: Normal  Communication   Communication Communication: No apparent difficulties    Cognition Arousal: Alert Behavior During Therapy: Impulsive, Anxious   PT - Cognitive impairments: Awareness, Memory, Attention, Initiation, Sequencing, Problem solving, Safety/Judgement                       PT - Cognition Comments: pt with poor insight to deficits, repeating information within session with poor memory of conversation. Pt also with significant confusion regarding medical changes and vitals during admission, adamant about going home and states she will not need assist despite declining ambulation and needing to complete 14 stairs Following commands: Impaired Following commands impaired: Follows one step commands inconsistently, Follows multi-step commands inconsistently     Cueing Cueing Techniques: Verbal cues, Visual cues     General Comments General comments (skin integrity, edema, etc.): pt with elevated BP and negative for orthostatic BP at this time, RN aware        Assessment/Plan    PT Assessment Patient needs continued PT services  PT Problem List Decreased strength;Decreased activity tolerance;Decreased balance;Decreased mobility;Decreased cognition;Decreased safety awareness;Decreased knowledge of precautions;Cardiopulmonary status limiting activity;Obesity       PT Treatment Interventions DME  instruction;Gait training;Stair training;Functional mobility training;Therapeutic activities;Therapeutic exercise;Balance training;Patient/family education    PT Goals (Current goals can be found in the Care Plan  section)  Acute Rehab PT Goals Patient Stated Goal: to return home asap PT Goal Formulation: With patient Time For Goal Achievement: 12/26/24 Potential to Achieve Goals: Fair    Frequency Min 2X/week        AM-PAC PT 6 Clicks Mobility  Outcome Measure Help needed turning from your back to your side while in a flat bed without using bedrails?: None Help needed moving from lying on your back to sitting on the side of a flat bed without using bedrails?: A Little Help needed moving to and from a bed to a chair (including a wheelchair)?: A Little Help needed standing up from a chair using your arms (e.g., wheelchair or bedside chair)?: A Little Help needed to walk in hospital room?: Total Help needed climbing 3-5 steps with a railing? : Total 6 Click Score: 15    End of Session Equipment Utilized During Treatment: Gait belt;Oxygen Activity Tolerance: Patient limited by fatigue Patient left: in bed;with call bell/phone within reach Nurse Communication: Mobility status PT Visit Diagnosis: Unsteadiness on feet (R26.81);Repeated falls (R29.6);Muscle weakness (generalized) (M62.81);History of falling (Z91.81)    Time: 8857-8772 PT Time Calculation (min) (ACUTE ONLY): 45 min   Charges:   PT Evaluation $PT Eval Low Complexity: 1 Low PT Treatments $Therapeutic Exercise: 8-22 mins $Therapeutic Activity: 8-22 mins PT General Charges $$ ACUTE PT VISIT: 1 Visit         Izetta Call, PT, DPT   Acute Rehabilitation Department Office [phone] Secure Chat Communication Preferredt  Izetta JULIANNA Call 12/12/2024, 12:48 PM

## 2024-12-28 ENCOUNTER — Emergency Department (HOSPITAL_COMMUNITY): Payer: Medicare (Managed Care)

## 2024-12-28 ENCOUNTER — Inpatient Hospital Stay (HOSPITAL_COMMUNITY)
Admission: EM | Admit: 2024-12-28 | Payer: Medicare (Managed Care) | Source: Home / Self Care | Attending: Internal Medicine | Admitting: Internal Medicine

## 2024-12-28 ENCOUNTER — Other Ambulatory Visit: Payer: Self-pay

## 2024-12-28 ENCOUNTER — Encounter (HOSPITAL_COMMUNITY): Payer: Self-pay

## 2024-12-28 DIAGNOSIS — J9621 Acute and chronic respiratory failure with hypoxia: Principal | ICD-10-CM | POA: Diagnosis present

## 2024-12-28 DIAGNOSIS — R079 Chest pain, unspecified: Secondary | ICD-10-CM | POA: Diagnosis present

## 2024-12-28 DIAGNOSIS — J810 Acute pulmonary edema: Secondary | ICD-10-CM

## 2024-12-28 DIAGNOSIS — J189 Pneumonia, unspecified organism: Secondary | ICD-10-CM | POA: Insufficient documentation

## 2024-12-28 DIAGNOSIS — I5033 Acute on chronic diastolic (congestive) heart failure: Secondary | ICD-10-CM | POA: Diagnosis present

## 2024-12-28 DIAGNOSIS — N179 Acute kidney failure, unspecified: Secondary | ICD-10-CM | POA: Diagnosis present

## 2024-12-28 LAB — RESP PANEL BY RT-PCR (RSV, FLU A&B, COVID)  RVPGX2
Influenza A by PCR: NEGATIVE
Influenza B by PCR: NEGATIVE
Resp Syncytial Virus by PCR: NEGATIVE
SARS Coronavirus 2 by RT PCR: NEGATIVE

## 2024-12-28 LAB — CBC WITH DIFFERENTIAL/PLATELET
Abs Immature Granulocytes: 0.27 10*3/uL — ABNORMAL HIGH (ref 0.00–0.07)
Basophils Absolute: 0 10*3/uL (ref 0.0–0.1)
Basophils Relative: 0 %
Eosinophils Absolute: 0 10*3/uL (ref 0.0–0.5)
Eosinophils Relative: 0 %
HCT: 37.4 % (ref 36.0–46.0)
Hemoglobin: 10.4 g/dL — ABNORMAL LOW (ref 12.0–15.0)
Immature Granulocytes: 1 %
Lymphocytes Relative: 4 %
Lymphs Abs: 0.8 10*3/uL (ref 0.7–4.0)
MCH: 24.9 pg — ABNORMAL LOW (ref 26.0–34.0)
MCHC: 27.8 g/dL — ABNORMAL LOW (ref 30.0–36.0)
MCV: 89.5 fL (ref 80.0–100.0)
Monocytes Absolute: 0.9 10*3/uL (ref 0.1–1.0)
Monocytes Relative: 5 %
Neutro Abs: 17.1 10*3/uL — ABNORMAL HIGH (ref 1.7–7.7)
Neutrophils Relative %: 90 %
Platelets: 271 10*3/uL (ref 150–400)
RBC: 4.18 MIL/uL (ref 3.87–5.11)
RDW: 16.3 % — ABNORMAL HIGH (ref 11.5–15.5)
WBC: 19.1 10*3/uL — ABNORMAL HIGH (ref 4.0–10.5)
nRBC: 0 % (ref 0.0–0.2)

## 2024-12-28 LAB — COMPREHENSIVE METABOLIC PANEL WITH GFR
ALT: 9 U/L (ref 0–44)
AST: 20 U/L (ref 15–41)
Albumin: 3.7 g/dL (ref 3.5–5.0)
Alkaline Phosphatase: 116 U/L (ref 38–126)
Anion gap: 11 (ref 5–15)
BUN: 19 mg/dL (ref 8–23)
CO2: 31 mmol/L (ref 22–32)
Calcium: 9.3 mg/dL (ref 8.9–10.3)
Chloride: 100 mmol/L (ref 98–111)
Creatinine, Ser: 1.5 mg/dL — ABNORMAL HIGH (ref 0.44–1.00)
GFR, Estimated: 38 mL/min — ABNORMAL LOW
Glucose, Bld: 140 mg/dL — ABNORMAL HIGH (ref 70–99)
Potassium: 4.3 mmol/L (ref 3.5–5.1)
Sodium: 142 mmol/L (ref 135–145)
Total Bilirubin: 1.2 mg/dL (ref 0.0–1.2)
Total Protein: 7 g/dL (ref 6.5–8.1)

## 2024-12-28 LAB — CK: Total CK: 171 U/L (ref 38–234)

## 2024-12-28 LAB — I-STAT CG4 LACTIC ACID, ED: Lactic Acid, Venous: 1.3 mmol/L (ref 0.5–1.9)

## 2024-12-28 LAB — PRO BRAIN NATRIURETIC PEPTIDE: Pro Brain Natriuretic Peptide: 1570 pg/mL — ABNORMAL HIGH

## 2024-12-28 MED ORDER — POTASSIUM CHLORIDE CRYS ER 20 MEQ PO TBCR
40.0000 meq | EXTENDED_RELEASE_TABLET | Freq: Once | ORAL | Status: AC
  Administered 2024-12-28: 40 meq via ORAL
  Filled 2024-12-28: qty 2

## 2024-12-28 MED ORDER — VANCOMYCIN HCL 2000 MG/400ML IV SOLN
2000.0000 mg | Freq: Once | INTRAVENOUS | Status: AC
  Administered 2024-12-29: 2000 mg via INTRAVENOUS
  Filled 2024-12-28: qty 400

## 2024-12-28 MED ORDER — FUROSEMIDE 10 MG/ML IJ SOLN
40.0000 mg | Freq: Once | INTRAMUSCULAR | Status: AC
  Administered 2024-12-28: 40 mg via INTRAVENOUS
  Filled 2024-12-28: qty 4

## 2024-12-28 MED ORDER — FENTANYL CITRATE (PF) 50 MCG/ML IJ SOSY
50.0000 ug | PREFILLED_SYRINGE | Freq: Once | INTRAMUSCULAR | Status: AC
  Administered 2024-12-28: 50 ug via INTRAVENOUS
  Filled 2024-12-28: qty 1

## 2024-12-28 MED ORDER — LACTATED RINGERS IV SOLN: INTRAVENOUS | Status: DC

## 2024-12-28 MED ORDER — ONDANSETRON HCL 4 MG/2ML IJ SOLN
4.0000 mg | Freq: Once | INTRAMUSCULAR | Status: AC
  Administered 2024-12-28: 4 mg via INTRAVENOUS
  Filled 2024-12-28: qty 2

## 2024-12-28 MED ORDER — VANCOMYCIN HCL IN DEXTROSE 1-5 GM/200ML-% IV SOLN: 1000.0000 mg | Freq: Once | INTRAVENOUS | Status: DC

## 2024-12-28 MED ORDER — SODIUM CHLORIDE 0.9 % IV SOLN
2.0000 g | Freq: Once | INTRAVENOUS | Status: AC
  Administered 2024-12-28: 2 g via INTRAVENOUS
  Filled 2024-12-28: qty 12.5
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
## 2024-12-28 NOTE — ED Triage Notes (Signed)
 Patient was seen a few days ago at the hospital and was diagnosed with pneumonia. While at the hospital she was given antibiotics. There was a debate if she truly had pneumonia or if it was just lung scaring. She was not sent home with antibiotic. Today oxygen saturation dropped into the 70's and she was lethargic. Fire placed her on 15 L O 2 and saturation increased to the mid 90's. She normally wears 2 L O 2 baseline. EMS administered 150 ml of fluid.    EMS vitals: 90/62 BP 25 RR 45 ET 105 HR 90's% SPO2 on 15 L O 2

## 2024-12-29 DIAGNOSIS — I5033 Acute on chronic diastolic (congestive) heart failure: Secondary | ICD-10-CM | POA: Diagnosis present

## 2024-12-29 LAB — BLOOD GAS, VENOUS
Acid-Base Excess: 4.9 mmol/L — ABNORMAL HIGH (ref 0.0–2.0)
Bicarbonate: 32.2 mmol/L — ABNORMAL HIGH (ref 20.0–28.0)
Drawn by: 551371
O2 Saturation: 56.1 %
Patient temperature: 37.1
pCO2, Ven: 61 mmHg — ABNORMAL HIGH (ref 44–60)
pH, Ven: 7.33 (ref 7.25–7.43)
pO2, Ven: 36 mmHg (ref 32–45)

## 2024-12-29 LAB — URINALYSIS, W/ REFLEX TO CULTURE (INFECTION SUSPECTED)
Bacteria, UA: NONE SEEN
Bilirubin Urine: NEGATIVE
Glucose, UA: NEGATIVE mg/dL
Hgb urine dipstick: NEGATIVE
Ketones, ur: NEGATIVE mg/dL
Leukocytes,Ua: NEGATIVE
Nitrite: NEGATIVE
Protein, ur: NEGATIVE mg/dL
Specific Gravity, Urine: 1.013 (ref 1.005–1.030)
pH: 5 (ref 5.0–8.0)

## 2024-12-29 LAB — BASIC METABOLIC PANEL WITH GFR
Anion gap: 8 (ref 5–15)
BUN: 19 mg/dL (ref 8–23)
CO2: 31 mmol/L (ref 22–32)
Calcium: 9.1 mg/dL (ref 8.9–10.3)
Chloride: 102 mmol/L (ref 98–111)
Creatinine, Ser: 1.14 mg/dL — ABNORMAL HIGH (ref 0.44–1.00)
GFR, Estimated: 53 mL/min — ABNORMAL LOW
Glucose, Bld: 137 mg/dL — ABNORMAL HIGH (ref 70–99)
Potassium: 4.4 mmol/L (ref 3.5–5.1)
Sodium: 141 mmol/L (ref 135–145)

## 2024-12-29 LAB — CBC
HCT: 33.3 % — ABNORMAL LOW (ref 36.0–46.0)
Hemoglobin: 9.5 g/dL — ABNORMAL LOW (ref 12.0–15.0)
MCH: 25.4 pg — ABNORMAL LOW (ref 26.0–34.0)
MCHC: 28.5 g/dL — ABNORMAL LOW (ref 30.0–36.0)
MCV: 89 fL (ref 80.0–100.0)
Platelets: 274 10*3/uL (ref 150–400)
RBC: 3.74 MIL/uL — ABNORMAL LOW (ref 3.87–5.11)
RDW: 16.3 % — ABNORMAL HIGH (ref 11.5–15.5)
WBC: 16.5 10*3/uL — ABNORMAL HIGH (ref 4.0–10.5)
nRBC: 0 % (ref 0.0–0.2)

## 2024-12-29 LAB — TROPONIN T, HIGH SENSITIVITY: Troponin T High Sensitivity: 33 ng/L — ABNORMAL HIGH (ref 0–19)

## 2024-12-29 LAB — MRSA NEXT GEN BY PCR, NASAL: MRSA by PCR Next Gen: NOT DETECTED

## 2024-12-29 LAB — CBG MONITORING, ED: Glucose-Capillary: 230 mg/dL — ABNORMAL HIGH (ref 70–99)

## 2024-12-29 MED ORDER — ALBUTEROL SULFATE (2.5 MG/3ML) 0.083% IN NEBU
2.5000 mg | INHALATION_SOLUTION | Freq: Four times a day (QID) | RESPIRATORY_TRACT | Status: AC
  Administered 2024-12-30 – 2024-12-31 (×6): 2.5 mg via RESPIRATORY_TRACT
  Filled 2024-12-29 (×8): qty 3

## 2024-12-29 MED ORDER — PANTOPRAZOLE SODIUM 40 MG PO TBEC
40.0000 mg | DELAYED_RELEASE_TABLET | Freq: Every day | ORAL | Status: AC
  Administered 2024-12-29 – 2024-12-31 (×3): 40 mg via ORAL
  Filled 2024-12-29 (×3): qty 1

## 2024-12-29 MED ORDER — NALOXONE HCL 2 MG/2ML IJ SOSY
PREFILLED_SYRINGE | INTRAMUSCULAR | Status: AC
  Filled 2024-12-29: qty 2

## 2024-12-29 MED ORDER — OXYCODONE-ACETAMINOPHEN 10-325 MG PO TABS: 1.0000 | ORAL_TABLET | Freq: Four times a day (QID) | ORAL | Status: DC

## 2024-12-29 MED ORDER — ACETAMINOPHEN 500 MG PO TABS: 1000.0000 mg | ORAL_TABLET | Freq: Three times a day (TID) | ORAL | Status: DC | PRN

## 2024-12-29 MED ORDER — ALPRAZOLAM 0.5 MG PO TABS
1.0000 mg | ORAL_TABLET | Freq: Three times a day (TID) | ORAL | Status: AC | PRN
  Administered 2024-12-29 – 2024-12-31 (×8): 1 mg via ORAL
  Filled 2024-12-29 (×8): qty 2

## 2024-12-29 MED ORDER — VANCOMYCIN HCL IN DEXTROSE 1-5 GM/200ML-% IV SOLN
1000.0000 mg | INTRAVENOUS | Status: DC
  Administered 2024-12-29: 1000 mg via INTRAVENOUS
  Filled 2024-12-29: qty 200

## 2024-12-29 MED ORDER — OXYBUTYNIN CHLORIDE 5 MG PO TABS
5.0000 mg | ORAL_TABLET | Freq: Two times a day (BID) | ORAL | Status: AC
  Administered 2024-12-29 – 2024-12-31 (×6): 5 mg via ORAL
  Filled 2024-12-29 (×6): qty 1

## 2024-12-29 MED ORDER — ALBUTEROL SULFATE (2.5 MG/3ML) 0.083% IN NEBU: 2.5000 mg | INHALATION_SOLUTION | Freq: Four times a day (QID) | RESPIRATORY_TRACT | Status: AC | PRN

## 2024-12-29 MED ORDER — NALOXONE HCL 0.4 MG/ML IJ SOLN
0.4000 mg | Freq: Once | INTRAMUSCULAR | Status: AC
  Administered 2024-12-29: 0.4 mg via INTRAVENOUS

## 2024-12-29 MED ORDER — FONDAPARINUX SODIUM 2.5 MG/0.5ML ~~LOC~~ SOLN
2.5000 mg | SUBCUTANEOUS | Status: AC
  Administered 2024-12-30 – 2024-12-31 (×2): 2.5 mg via SUBCUTANEOUS
  Filled 2024-12-29 (×3): qty 0.5

## 2024-12-29 MED ORDER — OXYCODONE HCL 5 MG PO TABS
5.0000 mg | ORAL_TABLET | Freq: Four times a day (QID) | ORAL | Status: DC | PRN
  Administered 2024-12-29: 5 mg via ORAL
  Filled 2024-12-29: qty 1

## 2024-12-29 MED ORDER — GABAPENTIN 300 MG PO CAPS
600.0000 mg | ORAL_CAPSULE | Freq: Three times a day (TID) | ORAL | Status: AC
  Administered 2024-12-29 – 2024-12-31 (×8): 600 mg via ORAL
  Filled 2024-12-29 (×8): qty 2

## 2024-12-29 MED ORDER — FUROSEMIDE 10 MG/ML IJ SOLN
40.0000 mg | Freq: Two times a day (BID) | INTRAMUSCULAR | Status: DC
  Administered 2024-12-29: 40 mg via INTRAVENOUS
  Filled 2024-12-29: qty 4

## 2024-12-29 MED ORDER — OXYCODONE-ACETAMINOPHEN 5-325 MG PO TABS
1.0000 | ORAL_TABLET | Freq: Four times a day (QID) | ORAL | Status: AC | PRN
  Administered 2024-12-29 – 2024-12-31 (×8): 1 via ORAL
  Filled 2024-12-29 (×8): qty 1

## 2024-12-29 MED ORDER — ALBUTEROL SULFATE (2.5 MG/3ML) 0.083% IN NEBU
2.5000 mg | INHALATION_SOLUTION | RESPIRATORY_TRACT | Status: DC
  Filled 2024-12-29: qty 3

## 2024-12-29 MED ORDER — CARVEDILOL 3.125 MG PO TABS: 3.1250 mg | ORAL_TABLET | Freq: Two times a day (BID) | ORAL | Status: DC

## 2024-12-29 MED ORDER — DM-GUAIFENESIN ER 30-600 MG PO TB12
1.0000 | ORAL_TABLET | Freq: Two times a day (BID) | ORAL | Status: AC | PRN
  Administered 2024-12-30: 1 via ORAL
  Filled 2024-12-29: qty 1

## 2024-12-29 MED ORDER — NALOXONE HCL 0.4 MG/ML IJ SOLN: 0.4000 mg | INTRAMUSCULAR | Status: AC | PRN

## 2024-12-29 MED ORDER — SODIUM CHLORIDE 0.9 % IV SOLN
2.0000 g | Freq: Two times a day (BID) | INTRAVENOUS | Status: AC
  Administered 2024-12-29 – 2024-12-31 (×6): 2 g via INTRAVENOUS
  Filled 2024-12-29 (×6): qty 12.5

## 2024-12-29 NOTE — Progress Notes (Signed)
 "  12/29/24 0908  TOC ED Mini Assessment  TOC Time spent with patient (minutes): 20  PING Used in TOC Assessment Yes  Admission or Readmission Diverted Yes  Means of departure Car (family will provide transportation at d/c)   Transition of Care [facility]) - Emergency Department Mini Assessment   Patient Details  Name: Teresa Wiley MRN: [id number] Date of Birth: [birth year]  Transition of [facility]) CM/SW Contact:    Camelia JONETTA Cary, RN Phone Number: 12/29/2024, 9:09 AM   Clinical Narrative:  RNCM received consult for PT/OT evaluation and home health assessment. Pt. Stated that she would like services with [facility] health if needed. RNCM entered orders for PT/OT evaluations and contacted [facility]). [facility] services at d/c.      ED Mini Assessment:          Means of departure: (P) Car (family will provide transportation at d/c)       Patient Contact and Communications        ,                 Admission diagnosis:  Acute on chronic heart failure with preserved ejection fraction (HFpEF) (HCC) [I50.33] Patient Active Problem List   Diagnosis Date Noted   Acute on chronic heart failure with preserved ejection fraction (HFpEF) (HCC) 12/29/2024   CHF (congestive heart failure) (HCC) 12/10/2024   Acute on chronic hypoxic respiratory failure (HCC) 05/23/2024   Postural dizziness with presyncope 05/23/2024   Recurrent falls 05/23/2024   Chronic anemia 05/23/2024   Constipation 05/23/2024   Cognitive impairment 05/23/2024   Polypharmacy 04/08/2024   Syncope 04/07/2024   Overdose of buprenorphine  (HCC) 02/01/2024   Bronchiectasis (HCC) - chronic right RLL 12/16/2023   Bronchomalacia 09/23/2023   Fall 09/23/2023   Physical deconditioning 12/12/2022   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) 09/13/2022   ASD (atrial septal defect) status post closure October 2023    Allergy to contrast media (used for diagnostic  x-rays)    OSA (obstructive sleep apnea) 08/24/2021   PND (post-nasal drip) 06/07/2021   Chest pain 07/19/2020   Acute on chronic diastolic CHF (congestive heart failure) (HCC) 02/09/2019   AKI (acute kidney injury) 02/09/2019   Pulmonary embolism (HCC)in 2017 02/09/2019   GERD (gastroesophageal reflux disease) 02/09/2019   Chronic hypoxic respiratory failure (HCC) 02/09/2019   Encephalopathy 12/06/2018   Recurrent syncope 11/17/2018   Polypharmacy 11/17/2018   Non-insulin  dependent type 2 diabetes mellitus (HCC) 01/10/2016   Narcotic dependence (HCC) 10/18/2014   Chronic pain syndrome 10/18/2014   Diabetes type 2, controlled (HCC)    Pulmonary hypertension (HCC)    Healthcare-associated pneumonia 10/17/2014   S/P cholecystectomy 07/19/2014   Abnormal EKG 07/19/2014   Sludge in gallbladder 03/09/2014   Steatohepatitis, 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/2014   Tobacco abuse 03/09/2014   Urinary incontinence in female 03/09/2014   Nocturnal hypoxemia due to obesity 03/09/2014   DM (diabetes mellitus) (HCC) 03/09/2014   Essential hypertension 03/09/2014   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 [place] [address] Phone: [phone] Fax: [phone]   "

## 2024-12-29 NOTE — ED Notes (Signed)
 Patient a/o x4 on RN assessment at 1250. At 1330 patient appeared unresponsive, unable to arouse. EDP and charge notified and at bedside. Admitting provider notified and at bedside. Patient received medication - refer to [facility]. Patient is now A/o x3 confused on situation. VS now stable. RN continues monitoring

## 2024-12-29 NOTE — Evaluation (Addendum)
 " Physical Therapy Evaluation Patient Details Name: Teresa Wiley MRN: [id number] DOB: 09-01-1956 Today's Date: 12/29/2024  History of Present Illness  69 y.o. female with medical history significant of chronic hypoxemic hypercarbic respiratory failure on 2 L [place], ASD status post percutaneous closure 08/2022, HFpEF, pulmonary hypertension, COPD, hypertension, bronchomalacia, tobacco abuse, GERD, esophageal dysmotility, hiatal hernia, memory loss, anxiety, type 2 diabetes, CVA, chronic anemia, chronic pain syndrome, marijuana abuse, overactive bladder, history of presyncope/syncope in the past felt to be due to polypharmacy.  Patient was recently admitted 1/16-1/18 for recurrent presyncope/syncope for which she underwent extensive workup and the etiology was felt to be polypharmacy.  Clinical Impression  Pt admitted with above diagnosis.  Pt currently with functional limitations due to the deficits listed below (see PT Problem List). Pt will benefit from acute skilled PT to increase their independence and safety with mobility to allow discharge.     The patient reports that she  gets lightheaded, has had 4 falls since DC from [facility]. Patient reports that she has  been able to get to her BR on 2nd level.  Patient resting on 13 L HFNC, SPo2  95%. Placed on 7 l with drop to 87%, placed on 10 L with spo2 back to 95%.  Placed back on 13 L which is what she was on initially.Patient did have questions about the number of O2 L now vs home O2 of 2 L  BP 90/75, 91/64 standing.  Reports Lightheaded.  Patient declined post acute rehab at a facility. Recommend HHPT continuation.      If plan is discharge home, recommend the following: A lot of help with walking and/or transfers;A lot of help with bathing/dressing/bathroom;Assistance with cooking/housework;Direct supervision/assist for medications management;Direct supervision/assist for financial management;Assist for transportation;Help with stairs or ramp for  entrance;Supervision due to cognitive status   Can travel by private vehicle        Equipment Recommendations None recommended by PT  Recommendations for Other Services       Functional Status Assessment Patient has had a recent decline in their functional status and demonstrates the ability to make significant improvements in function in a reasonable and predictable amount of time.     Precautions / Restrictions Precautions Precautions: Fall Recall of Precautions/Restrictions: Intact Precaution/Restrictions Comments: watche O2 and BP Restrictions Weight Bearing Restrictions Per Provider Order: No      Mobility  Bed Mobility   Bed Mobility: Supine to Sit, Sit to Supine     Supine to sit: Contact guard, HOB elevated, Used rails Sit to supine: Min assist, HOB elevated, Used rails   General bed mobility comments: Assist to bring BLE up onto stretcher    Transfers Overall transfer level: Needs assistance Equipment used: Rolling walker (2 wheels) Transfers: Sit to/from Stand Sit to Stand: Contact guard assist           General transfer comment: pt able to stand at edge of stretcher with RW while BP was taken    Ambulation/Gait Ambulation/Gait assistance: Min assist, +2 safety/equipment             General Gait Details: able to step along bed edge toward [facility]  Stairs            Wheelchair Mobility     Tilt Bed    Modified Rankin (Stroke Patients Only)       Balance Overall balance assessment: Needs assistance Sitting-balance support: Single extremity supported, Feet unsupported Sitting balance-Leahy Scale: Fair     Standing balance  support: Bilateral upper extremity supported, Reliant on assistive device for balance, During functional activity Standing balance-Leahy Scale: Poor Standing balance comment: Dependent on external support to maintain balance, posterior LOB needing minA to correct                              Pertinent Vitals/Pain Pain Assessment Pain Assessment: No/denies pain    Home Living Family/patient expects to be discharged to:: Private residence Living Arrangements: Spouse/significant other;Other relatives;Children (grandson, son and daughter-in-law) Available Help at Discharge: Family Type of Home: House Home Access: Stairs to enter Entrance Stairs-Rails: None Entrance Stairs-Number of Steps: 3 Alternate Level Stairs-Number of Steps: flight Home Layout: Two level;Bed/bath upstairs Home Equipment: Rollator (4 wheels);Cane - single point;Other (comment);Grab bars - toilet;Grab bars - tub/shower;Hand held shower head;Wheelchair - manual      Prior Function Prior Level of Function : History of Falls (last six months);Independent/Modified Independent             Mobility Comments: reports spending most of her time in bed, 3 recent falls, reports able to get up the flight of steps       Extremity/Trunk Assessment   Upper Extremity Assessment Upper Extremity Assessment: Generalized weakness    Lower Extremity Assessment Lower Extremity Assessment: Generalized weakness    Cervical / Trunk Assessment Cervical / Trunk Assessment: Other exceptions Cervical / Trunk Exceptions: body habitus  Communication   Communication Communication: No apparent difficulties    Cognition Arousal: Alert Behavior During Therapy: Anxious                           PT - Cognition Comments: patient takes extra time to explain how she gets around, asking multiple times about the O2(HF) vs her [place] at home Following commands: Impaired Following commands impaired: Follows one step commands with increased time     Cueing Cueing Techniques: Verbal cues     General Comments General comments (skin integrity, edema, etc.): BP taken supine, seated, and standing and did not indicate orthostatic BP. Pt on 13L HFNC with SpO2 in high 90's. Did decrease O2 to 7L during session although  SpO2 dropped to 80's. Increased O2 back to 13L at end of session with SpO2 in high 90's    Exercises     Assessment/Plan    PT Assessment Patient needs continued PT services  PT Problem List Decreased strength;Decreased activity tolerance;Decreased balance;Decreased mobility;Decreased cognition;Decreased safety awareness;Decreased knowledge of precautions;Cardiopulmonary status limiting activity;Obesity       PT Treatment Interventions DME instruction;Gait training;Stair training;Functional mobility training;Therapeutic activities;Therapeutic exercise;Balance training;Patient/family education    PT Goals (Current goals can be found in the Care Plan section)  Acute Rehab PT Goals Patient Stated Goal: to return home PT Goal Formulation: With patient Time For Goal Achievement: 01/12/25 Potential to Achieve Goals: Fair    Frequency Min 3X/week     Co-evaluation PT/OT/SLP Co-Evaluation/Treatment: Yes Reason for Co-Treatment: To address functional/ADL transfers PT goals addressed during session: Mobility/safety with mobility OT goals addressed during session: ADL's and self-care;Strengthening/ROM       AM-PAC PT 6 Clicks Mobility  Outcome Measure Help needed turning from your back to your side while in a flat bed without using bedrails?: None Help needed moving from lying on your back to sitting on the side of a flat bed without using bedrails?: A Little Help needed moving to and from a bed to a chair (including  a wheelchair)?: A Little Help needed standing up from a chair using your arms (e.g., wheelchair or bedside chair)?: A Little Help needed to walk in hospital room?: A Lot Help needed climbing 3-5 steps with a railing? : Total 6 Click Score: 16    End of Session Equipment Utilized During Treatment: Gait belt Activity Tolerance: Patient limited by fatigue Patient left: in bed;with call bell/phone within reach Nurse Communication: Mobility status PT Visit Diagnosis:  Unsteadiness on feet (R26.81);Repeated falls (R29.6);Muscle weakness (generalized) (M62.81);History of falling (Z91.81)    Time: 1020-1059 PT Time Calculation (min) (ACUTE ONLY): 39 min   Charges:   PT Evaluation $PT Eval Low Complexity: 1 Low   PT General Charges $$ ACUTE PT VISIT: 1 Visit         Darice Potters PT Acute Rehabilitation Services Office [phone]   Potters Darice Norris 12/29/2024, 12:34 PM "
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
## 2024-12-29 NOTE — Progress Notes (Signed)
 Pharmacy Antibiotic Note  Teresa Wiley is a 69 y.o. female admitted on 12/28/2024 with sepsis and concerns for PNA.  Pertinent PMH of chronic respiratory failure on 2L  chronically, ASD s/p percutaneous closure on 08/2022. Recently admitted possible PNA and treated with IV Ceftriaxone  and oral doxycyline. One time doses of Cefepime  and Vancomcyin given in the ED. Pharmacy has been consulted for cefepime  and vancomycin  dosing. - Scr 1.50 (BL ~1), WBC elevated, LA 1.3, Tmax 100.1  Plan: - Vancomycin  1000mg  IV q24h (eAUC 485.7, Scr 1.5, Vd 0.5) - Cefepime  2g IV q12h - Monitor renal function, clinical status, ability to de-escalate as appropriate  - Follow up cultures    Height: 5' 10 (177.8 cm) Weight: 111.1 kg (245 lb) IBW/kg (Calculated) : 68.5  Temp (24hrs), Avg:99.4 F (37.4 C), Min:98.6 F (37 C), Max:100.1 F (37.8 C)  Recent Labs  Lab 12/28/24 2159 12/28/24 2205  WBC 19.1*  --   CREATININE 1.50*  --   LATICACIDVEN  --  1.3    Estimated Creatinine Clearance: 49.1 mL/min (A) (by C-G formula based on SCr of 1.5 mg/dL (H)).    Allergies[1]  Antimicrobials this admission: Vanc 2/4 >> Cefepime  2/3 >>  Dose adjustments this admission:   Microbiology results: 2/3 BCx:  2/3 MRSA PCR:  2/3 Resp PCR: neg  Thank you for allowing pharmacy to be a part of this patients care.  Josmar Messimer 12/29/2024 7:19 AM     [1]  Allergies Allergen Reactions   Belbuca  [Buprenorphine  Hcl] Hives   E.E.S. [Erythromycin] Anaphylaxis and Nausea And Vomiting   Iodine  Hives, Swelling and Other (See Comments)    Throat swelling    Shellfish Allergy Anaphylaxis   Wound Dressing Adhesive Hives, Itching and Rash    PAPER TAPE ONLY   Butrans  [Buprenorphine ] Other (See Comments)    Patient stated the first time she used the patch she was talking to family just fine and the next thing she knew they were standing over me like I was dying   Latex Other (See Comments)    Powder in  the latex and balloons.   Porcine (Pork) Protein-Containing Drug Products Other (See Comments)    Religious   Other Rash    Electrodes stickers

## 2024-12-29 NOTE — ED Provider Notes (Signed)
 Called to see patient secondary to being unresponsive.  Patient's pupils were pinpoint.  Blood sugar checked and was 230.  Was hypotensive initially responded well to IV fluids.  Responded well to Narcan .  Dr. Jonel at bedside at this time and has assumed care   Dasie Faden, MD 12/29/24 1347

## 2024-12-29 NOTE — Hospital Course (Signed)
 69 y.o. F with COPD, dCHF, pHTN, bronchomalacia, hx ASD s/p closure in 2023, and CRF on 2L home O2, chronic pain syndrome on daily opiates, THC abuse, HTN, esophageal dysmotility, DM, hx CVA, overactive bladder and recurrent sycnope who presented with SOB, hypoxia and fall.  In the ER, CXR showed bilateral infiltrates, WBC elevated.  Admitted on diuretics, antibitoics.

## 2024-12-29 NOTE — Evaluation (Signed)
 Occupational Therapy Evaluation Patient Details Name: Teresa Wiley MRN: [id number] DOB: July 08, 1956 Today's Date: 12/29/2024   History of Present Illness   69 y.o. female with medical history significant of chronic hypoxemic hypercarbic respiratory failure on 2 L [place], ASD status post percutaneous closure 08/2022, HFpEF, pulmonary hypertension, COPD, hypertension, bronchomalacia, tobacco abuse, GERD, esophageal dysmotility, hiatal hernia, memory loss, anxiety, type 2 diabetes, CVA, chronic anemia, chronic pain syndrome, marijuana abuse, overactive bladder, history of presyncope/syncope in the past felt to be due to polypharmacy.  Patient was recently admitted 1/16-1/18 for recurrent presyncope/syncope for which she underwent extensive workup and the etiology was felt to be polypharmacy.     Clinical Impressions Pt admitted with the above concerns. Pt currently with functional limitations due to the deficits listed below (see OT Problem List). Pt reports that she was completing all required ADL's at home since her recent discharge from [facility] although things were difficult. Typically, pt is Mod I and uses home O2 and a rolator at home. Pt will benefit from acute skilled OT to increase their safety and independence with ADL and functional mobility for ADL to facilitate discharge. Recommend follow up Home Health OT at discharge to focus on increase overall functional independence with ADL's.      If plan is discharge home, recommend the following:   A little help with walking and/or transfers;Assistance with cooking/housework;Assist for transportation;Help with stairs or ramp for entrance;A little help with bathing/dressing/bathroom     Functional Status Assessment   Patient has had a recent decline in their functional status and demonstrates the ability to make significant improvements in function in a reasonable and predictable amount of time.     Equipment Recommendations   BSC/3in1       Precautions/Restrictions   Precautions Precautions: Fall Recall of Precautions/Restrictions: Intact Restrictions Weight Bearing Restrictions Per Provider Order: No     Mobility Bed Mobility Overal bed mobility: Needs Assistance Bed Mobility: Supine to Sit, Sit to Supine     Supine to sit: Contact guard, HOB elevated, Used rails Sit to supine: Min assist, HOB elevated, Used rails   General bed mobility comments: Assist to bring BLE up onto stretcher    Transfers Overall transfer level: Needs assistance Equipment used: Rolling walker (2 wheels) Transfers: Sit to/from Stand Sit to Stand: Contact guard assist           General transfer comment: pt able to stand at edge of stretcher with RW while BP was taken      Balance Overall balance assessment: Needs assistance Sitting-balance support: Single extremity supported, Feet unsupported Sitting balance-Leahy Scale: Fair     Standing balance support: Bilateral upper extremity supported, Reliant on assistive device for balance Standing balance-Leahy Scale: Poor      ADL either performed or assessed with clinical judgement   ADL Overall ADL's : Needs assistance/impaired Eating/Feeding: Independent;Bed level   Grooming: Independent;Sitting   Upper Body Bathing: Minimal assistance;Sitting   Lower Body Bathing: Sitting/lateral leans;Moderate assistance   Upper Body Dressing : Set up;Sitting   Lower Body Dressing: Moderate assistance;Sitting/lateral leans   Toilet Transfer: Contact guard assist;Stand-pivot;BSC/3in1   Toileting- Clothing Manipulation and Hygiene: Moderate assistance;Sitting/lateral lean               Vision Baseline Vision/History: 0 No visual deficits Ability to See in Adequate Light: 0 Adequate Patient Visual Report: No change from baseline              Pertinent Vitals/Pain Pain Assessment Pain Assessment: No/denies  pain     Extremity/Trunk Assessment Upper Extremity  Assessment Upper Extremity Assessment: Generalized weakness   Lower Extremity Assessment Lower Extremity Assessment: Defer to PT evaluation   Cervical / Trunk Assessment Cervical / Trunk Assessment: Other exceptions Cervical / Trunk Exceptions: body habitus   Communication Communication Communication: No apparent difficulties   Cognition Arousal: Alert Behavior During Therapy: Anxious Cognition: No apparent impairments    Following commands: Impaired Following commands impaired: Follows one step commands with increased time     Cueing  General Comments   Cueing Techniques: Verbal cues  BP taken supine, seated, and standing and did not indicate orthostatic BP. Pt on 13L [facility]NC with SpO2 in high 90's. Did decrease O2 to 7L during session although SpO2 dropped to 80's. Increased O2 back to 13L at end of session with SpO2 in high 90's           Home Living Family/patient expects to be discharged to:: Private residence Living Arrangements: Spouse/significant other;Other relatives;Children (grandson, son and daughter-in-law) Available Help at Discharge: Family Type of Home: House Home Access: Stairs to enter Secretary/administrator of Steps: 3 Entrance Stairs-Rails: None Home Layout: Two level;Bed/bath upstairs Alternate Level Stairs-Number of Steps: flight Alternate Level Stairs-Rails: Right Bathroom Shower/Tub: Chief Strategy Officer: Standard     Home Equipment: Rollator (4 wheels);Cane - single point;Other (comment);Grab bars - toilet;Grab bars - tub/shower;Hand held shower head;Wheelchair - manual          Prior Functioning/Environment Prior Level of Function : History of Falls (last six months);Independent/Modified Independent       OT Problem List: Decreased strength;Decreased activity tolerance;Impaired balance (sitting and/or standing);Decreased safety awareness;Decreased knowledge of use of DME or AE;Decreased knowledge of precautions   OT  Treatment/Interventions: Self-care/ADL training;Therapeutic exercise;Energy conservation;DME and/or AE instruction;Therapeutic activities;Patient/family education;Balance training      OT Goals(Current goals can be found in the care plan section)   Acute Rehab OT Goals OT Goal Formulation: With patient Time For Goal Achievement: 01/12/25 Potential to Achieve Goals: Good   OT Frequency:  Min 2X/week    Co-evaluation PT/OT/SLP Co-Evaluation/Treatment: Yes Reason for Co-Treatment: To address functional/ADL transfers   OT goals addressed during session: ADL's and self-care;Strengthening/ROM      AM-PAC OT 6 Clicks Daily Activity     Outcome Measure Help from another person eating meals?: None Help from another person taking care of personal grooming?: None Help from another person toileting, which includes using toliet, bedpan, or urinal?: A Little Help from another person bathing (including washing, rinsing, drying)?: A Little Help from another person to put on and taking off regular upper body clothing?: A Little Help from another person to put on and taking off regular lower body clothing?: A Little 6 Click Score: 20   End of Session Equipment Utilized During Treatment: Oxygen;Rolling walker (2 wheels)  Activity Tolerance: Patient tolerated treatment well Patient left: in bed;with call bell/phone within reach  OT Visit Diagnosis: Unsteadiness on feet (R26.81);Repeated falls (R29.6);Muscle weakness (generalized) (M62.81);History of falling (Z91.81)                Time: 8968-8944 OT Time Calculation (min): 24 min Charges:  OT General Charges $OT Visit: 1 Visit OT Evaluation $OT Eval Low Complexity: 1 Low  Leita Howell, OTR/L,CBIS  Supplemental OT - [facility] and [facility] Secure Chat Preferred    [place] Pirie, Leita BIRCH 12/29/2024, 12:00 PM

## 2024-12-29 NOTE — ED Notes (Signed)
 Dr. Catrina at bedside discussing plan of care and disposition with patient.  Orders to stop IVF order
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
## 2024-12-29 NOTE — ED Notes (Signed)
 Hospitalist at bedside, pt had questions regarding lasix  and admission orders. Requested to hold lasix  until she speaks with hospitalist.

## 2024-12-29 NOTE — Progress Notes (Signed)
"  ° ° °  Patient: Teresa Wiley FMW:[id number] DOB: [birth year]      Brief hospital course: 69 y.o. F with COPD, dCHF, pHTN, bronchomalacia, hx ASD s/p closure in 2023, and CRF on 2L home O2, chronic pain syndrome on daily opiates, THC abuse, HTN, esophageal dysmotility, DM, hx CVA, overactive bladder and recurrent sycnope who presented with SOB, hypoxia and fall.  In the ER, CXR showed bilateral infiltrates, WBC elevated.  Admitted on diuretics, antibitoics.    This is a no charge note, for further details, please see the H&P by my partner, Dr. Alfornia from earlier today.   Principal Problem:   Acute on chronic heart failure with preserved ejection fraction (HFpEF) (HCC) Active Problems:   Healthcare-associated pneumonia   Acute on chronic diastolic CHF (congestive heart failure) (HCC)   AKI (acute kidney injury)   Chest pain   Acute on chronic hypoxic respiratory failure (HCC)    Had an episode erarlier today when she took pills form her pocket, became unresponsive.  Given Narcan , returned to baseline mentation.  Family took purse home.       Physical Exam: BP (!) 140/129   Pulse 96   Temp 98.7 F (37.1 C) (Oral)   Resp (!) 22   Ht 5' 10 (1.778 m)   Wt 111.1 kg   SpO2 (!) 84%   BMI 35.15 kg/m   Patient seen and examined.     Family Communication: Son at bedside        Author: Lonni SHAUNNA Dalton, MD 12/29/2024 6:04 PM    "

## 2024-12-29 NOTE — ED Notes (Addendum)
 Writer entered room to help out. Writer explained she was going to draw labs and administer lasix  to pt. Pt advised she needed pain medication. Writer explained the medic was working on it and it would be a few minutes. Pt advised she no longer takes lasix . Writer started to explain to pt she would speak with doctor, but pt interrupted her and asked what is wrong with you?SABRA Writer advised nothing was wrong. Pt told writer to get out of the room!. Writer left room expeditiously.  Writer made Dr. Jonel aware that pt no longer takes lasix .

## 2024-12-30 LAB — COMPREHENSIVE METABOLIC PANEL WITH GFR
ALT: 7 U/L (ref 0–44)
AST: 25 U/L (ref 15–41)
Albumin: 3.3 g/dL — ABNORMAL LOW (ref 3.5–5.0)
Alkaline Phosphatase: 106 U/L (ref 38–126)
Anion gap: 12 (ref 5–15)
BUN: 20 mg/dL (ref 8–23)
CO2: 28 mmol/L (ref 22–32)
Calcium: 9.2 mg/dL (ref 8.9–10.3)
Chloride: 99 mmol/L (ref 98–111)
Creatinine, Ser: 1.15 mg/dL — ABNORMAL HIGH (ref 0.44–1.00)
GFR, Estimated: 52 mL/min — ABNORMAL LOW
Glucose, Bld: 128 mg/dL — ABNORMAL HIGH (ref 70–99)
Potassium: 4.3 mmol/L (ref 3.5–5.1)
Sodium: 139 mmol/L (ref 135–145)
Total Bilirubin: 0.6 mg/dL (ref 0.0–1.2)
Total Protein: 7.2 g/dL (ref 6.5–8.1)

## 2024-12-30 LAB — CBC
HCT: 33.1 % — ABNORMAL LOW (ref 36.0–46.0)
Hemoglobin: 9.3 g/dL — ABNORMAL LOW (ref 12.0–15.0)
MCH: 24.9 pg — ABNORMAL LOW (ref 26.0–34.0)
MCHC: 28.1 g/dL — ABNORMAL LOW (ref 30.0–36.0)
MCV: 88.5 fL (ref 80.0–100.0)
Platelets: 224 10*3/uL (ref 150–400)
RBC: 3.74 MIL/uL — ABNORMAL LOW (ref 3.87–5.11)
RDW: 16.5 % — ABNORMAL HIGH (ref 11.5–15.5)
WBC: 10.8 10*3/uL — ABNORMAL HIGH (ref 4.0–10.5)
nRBC: 0 % (ref 0.0–0.2)

## 2024-12-30 MED ORDER — FUROSEMIDE 40 MG PO TABS
40.0000 mg | ORAL_TABLET | Freq: Every day | ORAL | Status: DC
  Administered 2024-12-30: 40 mg via ORAL
  Filled 2024-12-30: qty 1

## 2024-12-30 MED ORDER — BACLOFEN 10 MG PO TABS
10.0000 mg | ORAL_TABLET | Freq: Two times a day (BID) | ORAL | Status: AC
  Administered 2024-12-31 (×2): 10 mg via ORAL
  Filled 2024-12-30 (×2): qty 1

## 2024-12-30 NOTE — Progress Notes (Signed)
" °  Progress Note   Patient: Teresa Wiley FMW:[id number] DOB: [birth year] DOA: 12/28/2024     1 DOS: the patient was seen and examined on 12/30/2024 at 9:20AM      Brief hospital course: 69 y.o. F with COPD, dCHF, pHTN, bronchomalacia, hx ASD s/p closure in 2023, and CRF on 2L home O2, chronic pain syndrome on daily opiates, THC abuse, HTN, esophageal dysmotility, DM, hx CVA, overactive bladder and recurrent sycnope who presented with SOB, hypoxia and fall.  In the ER, CXR showed bilateral infiltrates, WBC elevated.  Admitted on diuretics, antibitoics.     Assessment and Plan: Healthcare associated pneumonia Acute on chronic respiratory failure with hypoxia Bronchomalacia Pulmonary hypertension History of ASD s/p closure Patient remains hypoxic with just a few steps to the 80s.  Despite 4 L oxygen.  Still very short of breath. - Resume home Lasix  - Continue cefepime   Chronic diastolic congestive heart failure -Resume home Lasix   Acute kidney injury Creatinine improved to baseline  Chronic daily opiates Opiate overdose Patient had an opiate overdose in the ER. - Hold extended release oxycodone  - Continue as needed oxycodone  at lower than home dose - Continue gabapentin   Hypertension Cerebrovascular disease Blood pressure normal off medications - Hold carvedilol   Diabetes A1c controlled last summer - Hold Ozempic   Class 2 obesity - Hold Ozempic   IBS - Hold Ibsrela         Subjective: Patient still desaturating with minimal ambulation No fever, no sputum, no chest pain.     Physical Exam: BP 115/76   Pulse 89   Temp 98.6 F (37 C)   Resp 16   Ht 5' 10 (1.778 m)   Wt 113 kg   SpO2 97%   BMI 35.74 kg/m   Obese adult female, sitting up in bed, interactive and appropriate RRR, no murmurs, no peripheral edema Respiratory normal, lungs clear without rales or wheezes Abdomen soft, no tenderness palpation or guarding, no ascites or  distention Attention normal, affect normal, judgment and insight appear normal    Data Reviewed: Basic metabolic panel shows stable renal function, improved from previous, normal electrolytes CBC shows resolved leukocytosis, stable anemia    Family Communication:     Disposition: Status is: Inpatient         Author: Lonni SHAUNNA Dalton, MD 12/30/2024 6:44 PM  For on call review [email or web address].    "

## 2024-12-30 NOTE — Progress Notes (Signed)
 Patient ambulatory to nurses station and back, O2 sat dropped to 84% on 3 liters, 88% when back in room at rest on 3.  Bumped up to 4 liters and sats 94% at rest.

## 2024-12-30 NOTE — Progress Notes (Signed)
 Patient has not voided since before 7 a.m., states she has no urge to void. Bladder scan performed, read as 126 mls. Will continue to monitor.

## 2024-12-30 NOTE — Plan of Care (Signed)

## 2024-12-30 NOTE — Progress Notes (Addendum)
 Patient 89% on 3 liters (states she is on 2-3 liters at baseline), ambulated on 3 liters with desaturation to 77%.  Patient did  return to 85% on 3 liters at rest approximately 2 minutes.    Placed back on 4 liters with oxygen saturation improving to 95%.  MD notified of above.

## 2024-12-30 NOTE — Progress Notes (Signed)
 Occupational Therapy Treatment Patient Details Name: Teresa Wiley MRN: [id number] DOB: 22-Jul-1956 Today's Date: 12/30/2024   History of present illness 69 yr old female with medical history significant of chronic hypoxemic hypercarbic respiratory failure on 2 L [place], ASD status post percutaneous closure 08/2022, HFpEF, pulmonary hypertension, COPD, hypertension, bronchomalacia, tobacco abuse, GERD, esophageal dysmotility, hiatal hernia, memory loss, anxiety, type 2 diabetes, CVA, chronic anemia, chronic pain syndrome, marijuana abuse, overactive bladder, history of presyncope/syncope in the past felt to be due to polypharmacy.  Patient was recently admitted 1/16-1/18 for recurrent presyncope/syncope for which she underwent extensive workup and the etiology was felt to be polypharmacy.   OT comments  The pt was received in bed. She gently declined to attempt out of bed activity, reporting fatigue and indicating she had already been up ambulating in the halls earlier today. OT redirected her to and explained the role of OT. She was amendable to bed level intervention(s). She reported feelings of generalized weakness. As such, OT instructed her on upper body and lower body therapeutic exercises for strengthening and endurance training needed to facilitate progressive ADL performance. She subsequently used a resistance band to perform multiple reps and 1 set of bicep curls, lateral pulls, and vertical exchanges. She was instructed on implementing pursed lip breathing exercises in conjunction with therapeutic exercises. OT further provided introductory education on energy conservation strategies to implement as needed during daily activities; educational handout issued, with further reinforcement and education in this regard recommended. Continue OT plan of care. Home health OT is recommended.       If plan is discharge home, recommend the following:  A little help with walking and/or transfers;Assistance  with cooking/housework;Assist for transportation;Help with stairs or ramp for entrance;A little help with bathing/dressing/bathroom   Equipment Recommendations  BSC/3in1    Recommendations for Other Services      Precautions / Restrictions Precautions Precautions: Fall Restrictions Weight Bearing Restrictions Per Provider Order: No Other Position/Activity Restrictions: monitor O2 and blood pressure        ADL either performed or assessed with clinical judgement     Vision Baseline Vision/History: 1 Wears glasses           Communication Communication Communication: No apparent difficulties   Cognition Arousal: Alert Behavior During Therapy: Anxious      Following commands: Impaired Following commands impaired: Only follows one step commands consistently      Cueing   Cueing Techniques: Verbal cues             Pertinent Vitals/ Pain       Pain Assessment Pain Assessment: 0-10 Pain Score: 3  Pain Location: chronic back and BLE Pain Intervention(s): Limited activity within patient's tolerance, Monitored during session   Frequency  Min 2X/week        Progress Toward Goals  OT Goals(current goals can now be found in the care plan section)     Acute Rehab OT Goals OT Goal Formulation: With patient Time For Goal Achievement: 01/12/25 Potential to Achieve Goals: Good  Plan         AM-PAC OT 6 Clicks Daily Activity     Outcome Measure   Help from another person eating meals?: None Help from another person taking care of personal grooming?: None Help from another person toileting, which includes using toliet, bedpan, or urinal?: A Little Help from another person bathing (including washing, rinsing, drying)?: A Little   Help from another person to put on and taking off regular lower body clothing?:  A Little 6 Click Score: 17    End of Session Equipment Utilized During Treatment: Oxygen  OT Visit Diagnosis: Unsteadiness on feet  (R26.81);Repeated falls (R29.6);Muscle weakness (generalized) (M62.81);History of falling (Z91.81)   Activity Tolerance Other (comment) (fair tolerance)   Patient Left in bed;with call bell/phone within reach   Nurse Communication Mobility status        Time: 8354-8341 OT Time Calculation (min): 13 min  Charges: OT General Charges $OT Visit: 1 Visit OT Treatments $Therapeutic Activity: 8-22 mins    Delanna JINNY Lesches, OTR/L 12/30/2024, 5:56 PM

## 2024-12-31 LAB — COMPREHENSIVE METABOLIC PANEL WITH GFR
ALT: 9 U/L (ref 0–44)
AST: 18 U/L (ref 15–41)
Albumin: 3.4 g/dL — ABNORMAL LOW (ref 3.5–5.0)
Alkaline Phosphatase: 92 U/L (ref 38–126)
Anion gap: 10 (ref 5–15)
BUN: 13 mg/dL (ref 8–23)
CO2: 31 mmol/L (ref 22–32)
Calcium: 9.4 mg/dL (ref 8.9–10.3)
Chloride: 102 mmol/L (ref 98–111)
Creatinine, Ser: 0.95 mg/dL (ref 0.44–1.00)
GFR, Estimated: >60 mL/min
Glucose, Bld: 114 mg/dL — ABNORMAL HIGH (ref 70–99)
Potassium: 3.6 mmol/L (ref 3.5–5.1)
Sodium: 142 mmol/L (ref 135–145)
Total Bilirubin: 0.4 mg/dL (ref 0.0–1.2)
Total Protein: 7.1 g/dL (ref 6.5–8.1)

## 2024-12-31 LAB — CBC
HCT: 34.6 % — ABNORMAL LOW (ref 36.0–46.0)
Hemoglobin: 10.2 g/dL — ABNORMAL LOW (ref 12.0–15.0)
MCH: 25 pg — ABNORMAL LOW (ref 26.0–34.0)
MCHC: 29.5 g/dL — ABNORMAL LOW (ref 30.0–36.0)
MCV: 84.8 fL (ref 80.0–100.0)
Platelets: 252 10*3/uL (ref 150–400)
RBC: 4.08 MIL/uL (ref 3.87–5.11)
RDW: 16.3 % — ABNORMAL HIGH (ref 11.5–15.5)
WBC: 5.4 10*3/uL (ref 4.0–10.5)
nRBC: 0 % (ref 0.0–0.2)

## 2024-12-31 LAB — CULTURE, BLOOD (ROUTINE X 2)
Culture: NO GROWTH
Culture: NO GROWTH
Special Requests: ADEQUATE

## 2024-12-31 MED ORDER — HYDROCORTISONE ACETATE 25 MG RE SUPP
25.0000 mg | Freq: Two times a day (BID) | RECTAL | Status: AC
  Administered 2024-12-31: 25 mg via RECTAL
  Filled 2024-12-31 (×2): qty 1

## 2024-12-31 MED ORDER — CARVEDILOL 3.125 MG PO TABS
3.1250 mg | ORAL_TABLET | Freq: Two times a day (BID) | ORAL | Status: AC
  Administered 2024-12-31: 3.125 mg via ORAL
  Filled 2024-12-31: qty 1

## 2024-12-31 MED ORDER — FUROSEMIDE 40 MG PO TABS
40.0000 mg | ORAL_TABLET | Freq: Two times a day (BID) | ORAL | Status: DC
  Administered 2024-12-31 (×2): 40 mg via ORAL
  Filled 2024-12-31 (×2): qty 1

## 2024-12-31 MED ORDER — MELATONIN 3 MG PO TABS
3.0000 mg | ORAL_TABLET | Freq: Every evening | ORAL | Status: AC | PRN
  Administered 2024-12-31 (×2): 3 mg via ORAL
  Filled 2024-12-31 (×2): qty 1

## 2024-12-31 MED ORDER — FUROSEMIDE 40 MG PO TABS: 40.0000 mg | ORAL_TABLET | Freq: Every day | ORAL | Status: AC

## 2024-12-31 NOTE — Progress Notes (Signed)
 PT Cancellation Note  Patient Details Name: Teresa Wiley MRN: [id number] DOB: [birth year]   Cancelled Treatment:    Reason Eval/Treat Not Completed: Fatigue/lethargy limiting ability to participate Pt given meds and sleepy this morning, declined to participate.   Kati L Payson 12/31/2024, 1:55 PM

## 2024-12-31 NOTE — Progress Notes (Signed)
 Patient ambulatory to nurses station and back.  95% on 3 liters of oxygen at rest. With ambulation was able to maintain oxygen saturation in the mid to high 90's on 3 liters.  Tolerated walk much better than on 2/5.  Secure chat to MD regarding above.

## 2024-12-31 NOTE — Progress Notes (Signed)
" °  Progress Note   Patient: Teresa Wiley FMW:[id number] DOB: 1956-02-22 DOA: 12/28/2024     2 DOS: the patient was seen and examined on 12/31/2024        Brief hospital course: 69 y.o. F with COPD, dCHF, pHTN, bronchomalacia, hx ASD s/p closure in 2023, and CRF on 2L home O2, chronic pain syndrome on daily opiates, THC abuse, HTN, esophageal dysmotility, DM, hx CVA, overactive bladder and recurrent sycnope who presented with SOB, hypoxia and fall.  In the ER, CXR showed bilateral infiltrates, WBC elevated.  Admitted on diuretics, antibitoics.     Assessment and Plan: Healthcare associated pneumonia Acute on chronic respiratory failure with hypoxia Bronchomalacia Pulmonary hypertension History of ASD s/p closure Breathing better today, no desaturation with ambulation Discussed with pulmonology - Continue home Lasix  - Continue cefepime  day 4   Chronic diastolic congestive heart failure -Continue home Lasix    Acute kidney injury Creatinine improved to baseline   Chronic daily opiates Opiate overdose Patient had an opiate overdose in the ER. - Hold extended release oxycodone  - Continue as needed oxycodone  at lower than home dose - Continue gabapentin    Hypertension Cerebrovascular disease Blood pressure rising - Resume carvedilol    Diabetes A1c controlled last summer - Hold Ozempic    Class 2 obesity - Hold Ozempic    IBS - Hold Ibsrela            Subjective: Patient doing better today, no desaturations, no fever, no confusion.     Physical Exam: BP (!) 142/102 (BP Location: Left Arm)   Pulse 81   Temp 98.5 F (36.9 C) (Oral)   Resp 18   Ht 5' 10 (1.778 m)   Wt 112.6 kg   SpO2 95%   BMI 35.62 kg/m   Obese adult female, sitting up in bed, interactive and appropriate RRR, no murmurs, no peripheral edema Respiratory normal, lungs clear without rales or wheezes Abdomen soft, no tenderness palpation or guarding, no ascites or distention Attention  normal, affect normal, judgment and insight appear normal  Data Reviewed: CBC shows stable anemia, resolved leukocytosis BMP normal, cr normalized    Family Communication: Called to daughter, left VM    Disposition: Status is: Inpatient         Author: Lonni SHAUNNA Dalton, MD 12/31/2024 6:40 PM  For on call review [email or web address].    "
